# Patient Record
Sex: Male | Born: 1955
Health system: Southern US, Community
[De-identification: ages and names within clinical notes are randomized; demographics above are authoritative.]

## PROBLEM LIST (undated history)

## (undated) DIAGNOSIS — Z973 Presence of spectacles and contact lenses: Secondary | ICD-10-CM

## (undated) DIAGNOSIS — M1990 Unspecified osteoarthritis, unspecified site: Secondary | ICD-10-CM

## (undated) DIAGNOSIS — J189 Pneumonia, unspecified organism: Secondary | ICD-10-CM

## (undated) DIAGNOSIS — E119 Type 2 diabetes mellitus without complications: Secondary | ICD-10-CM

## (undated) DIAGNOSIS — I10 Essential (primary) hypertension: Secondary | ICD-10-CM

## (undated) HISTORY — PX: EYE SURGERY: SHX253

## (undated) HISTORY — PX: RETINAL DETACHMENT SURGERY: SHX105

## (undated) HISTORY — PX: VASECTOMY: SHX75

## (undated) HISTORY — PX: JOINT REPLACEMENT: SHX530

---

## 2000-12-01 ENCOUNTER — Encounter: Payer: Self-pay | Admitting: Emergency Medicine

## 2000-12-01 ENCOUNTER — Emergency Department (HOSPITAL_COMMUNITY): Admission: EM | Admit: 2000-12-01 | Discharge: 2000-12-01 | Payer: Self-pay | Admitting: Emergency Medicine

## 2002-04-23 ENCOUNTER — Inpatient Hospital Stay (HOSPITAL_COMMUNITY): Admission: AD | Admit: 2002-04-23 | Discharge: 2002-04-25 | Payer: Self-pay | Admitting: Internal Medicine

## 2002-04-23 ENCOUNTER — Encounter: Payer: Self-pay | Admitting: Internal Medicine

## 2002-04-24 ENCOUNTER — Encounter: Payer: Self-pay | Admitting: Internal Medicine

## 2002-04-29 ENCOUNTER — Ambulatory Visit (HOSPITAL_COMMUNITY): Admission: RE | Admit: 2002-04-29 | Discharge: 2002-04-29 | Payer: Self-pay | Admitting: Internal Medicine

## 2003-07-10 ENCOUNTER — Emergency Department (HOSPITAL_COMMUNITY): Admission: EM | Admit: 2003-07-10 | Discharge: 2003-07-10 | Payer: Self-pay | Admitting: Emergency Medicine

## 2003-09-13 ENCOUNTER — Ambulatory Visit (HOSPITAL_COMMUNITY): Admission: RE | Admit: 2003-09-13 | Discharge: 2003-09-13 | Payer: Self-pay | Admitting: Internal Medicine

## 2003-09-13 IMAGING — CR DG CHEST 2V
2 series · 2 of 2 positions shown · non-contrast
Comparison: none

CLINICAL DATA: Cough, congestion for one month.  The patient has hypertension.  
 TWO VIEW CHEST
 PA and lateral views of the chest are made [DATE] at [KJ] hours and are compared to previous studies of [DATE] and again show mild generalized peribronchial thickening with minimal anterior degenerative hypertrophic spurring of the spine.  The heart, mediastinum and soft tissues appear normal.
 IMPRESSION
 Normal chest except for stable anterior degenerative hypertrophic spurring of the mid thoracic spine.

[view not recorded (1 of 2)]
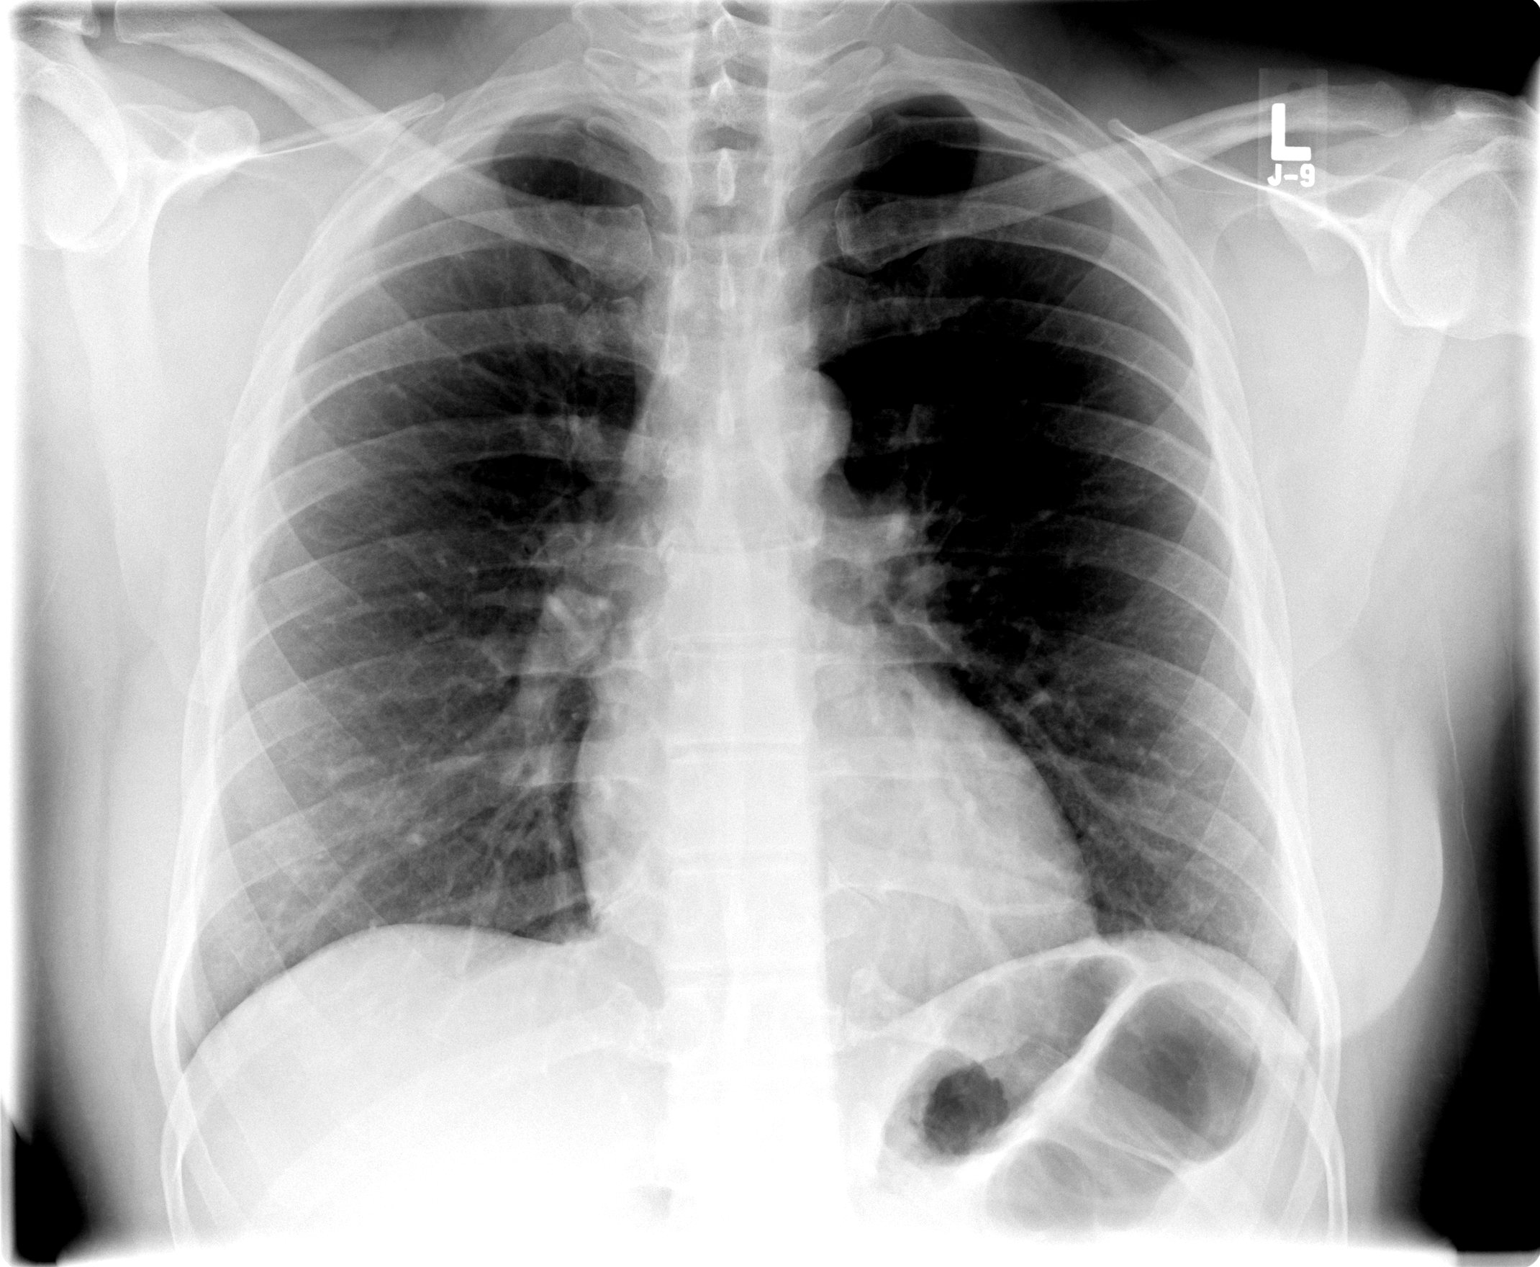

[view not recorded (2 of 2)]
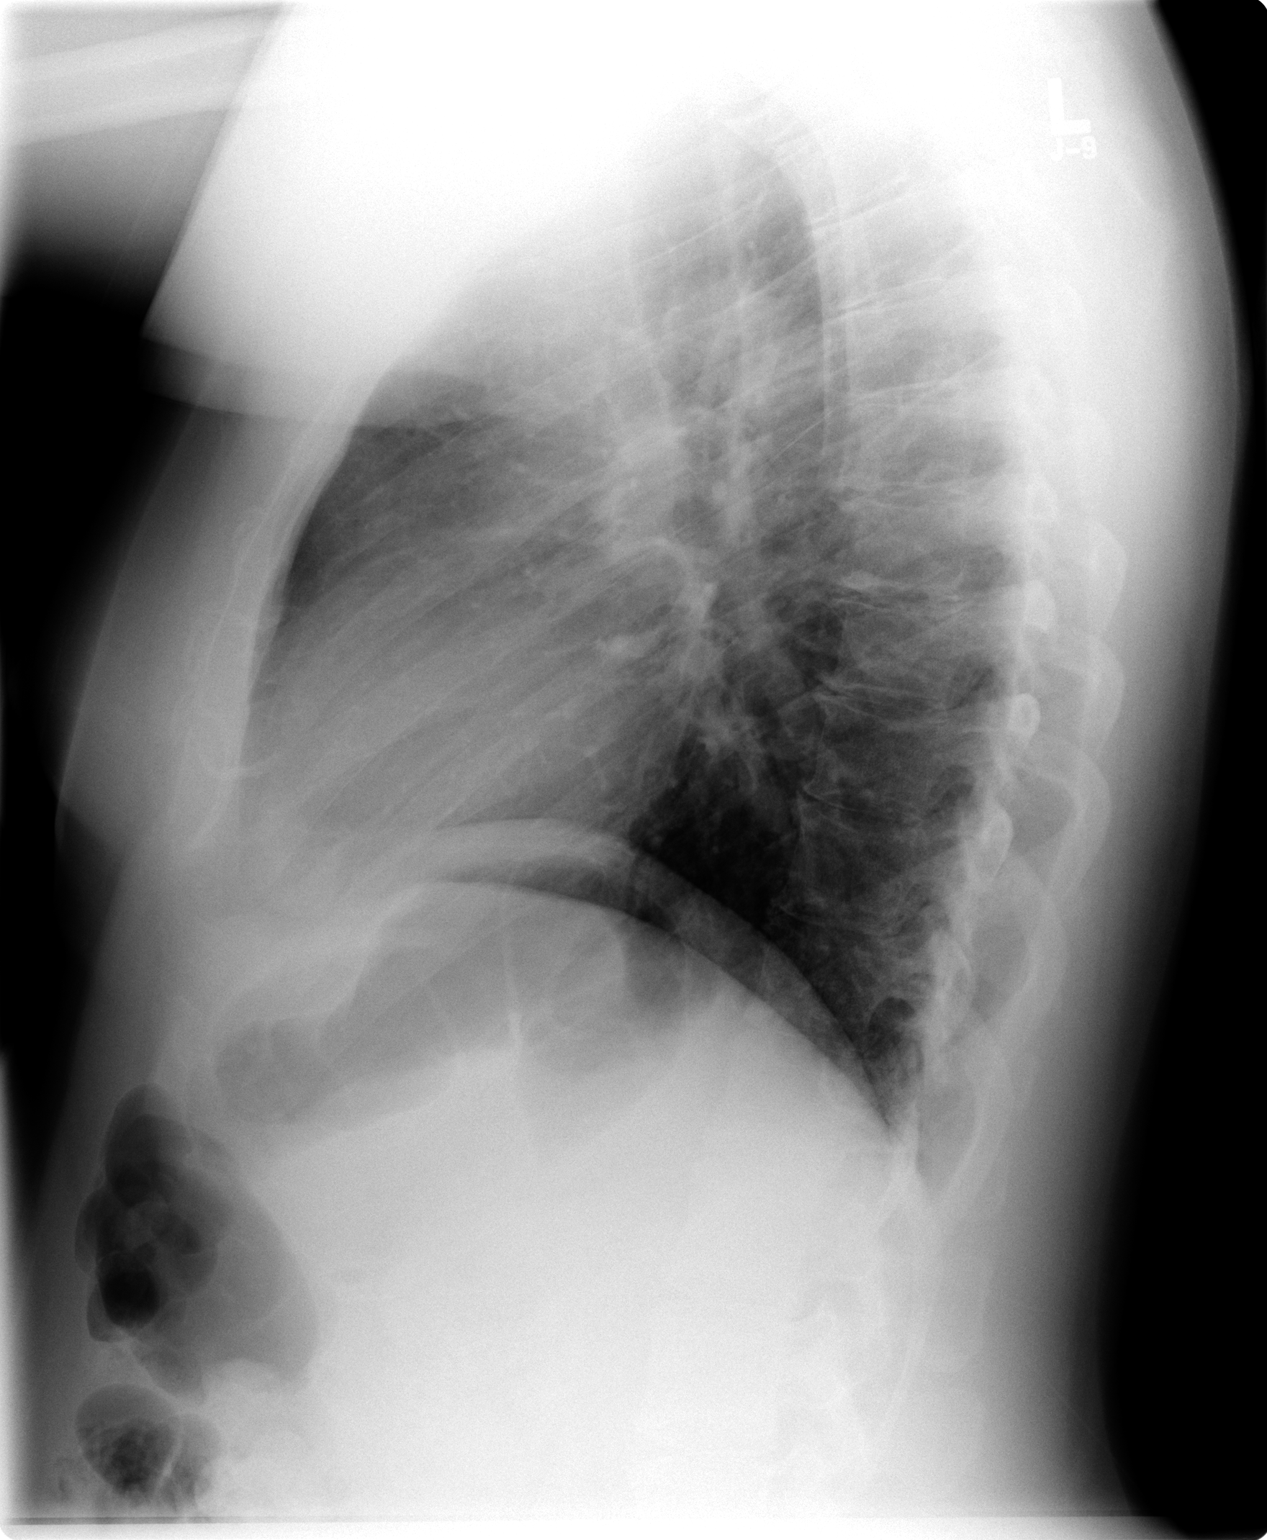

[2 of 2 positions shown; findings below may reference images not displayed]

## 2003-10-15 ENCOUNTER — Emergency Department (HOSPITAL_COMMUNITY): Admission: EM | Admit: 2003-10-15 | Discharge: 2003-10-15 | Payer: Self-pay | Admitting: Emergency Medicine

## 2004-01-04 ENCOUNTER — Emergency Department (HOSPITAL_COMMUNITY): Admission: EM | Admit: 2004-01-04 | Discharge: 2004-01-05 | Payer: Self-pay | Admitting: *Deleted

## 2004-01-15 ENCOUNTER — Emergency Department (HOSPITAL_COMMUNITY): Admission: EM | Admit: 2004-01-15 | Discharge: 2004-01-15 | Payer: Self-pay | Admitting: Emergency Medicine

## 2004-01-15 IMAGING — CR DG ANKLE COMPLETE 3+V*R*
2 series · 2 of 2 positions shown · non-contrast
Comparison: none

CLINICAL DATA: Right ankle pain.
 RIGHT ANKLE, THREE VIEWS
 The ankle mortis is maintained.  No acute fractures are seen.  On the AP view, there is a rounded bony density in the medial joint space, but I do not see this on the mortis view.  I think it may be part of the medial malleolus.  No acute bony findings.
 IMPRESSION
 No acute bony findings.

[view not recorded (1 of 2)]
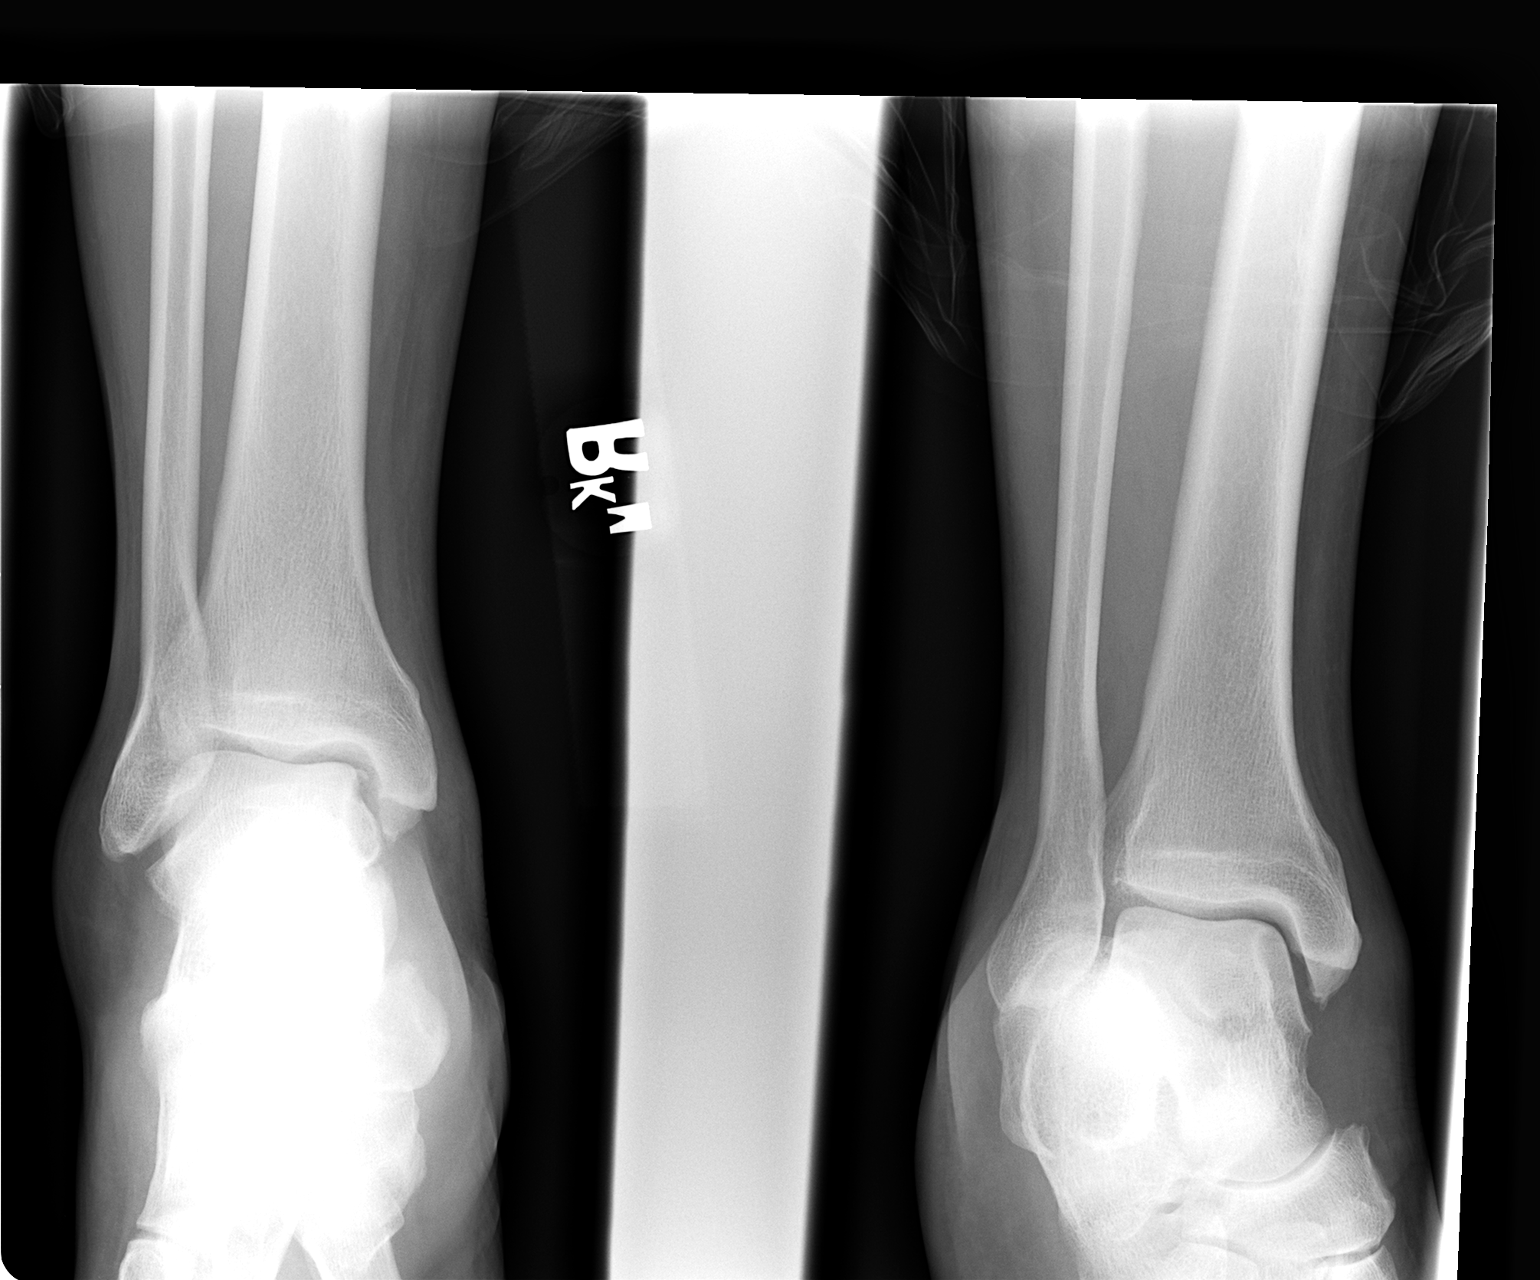

[view not recorded (2 of 2)]
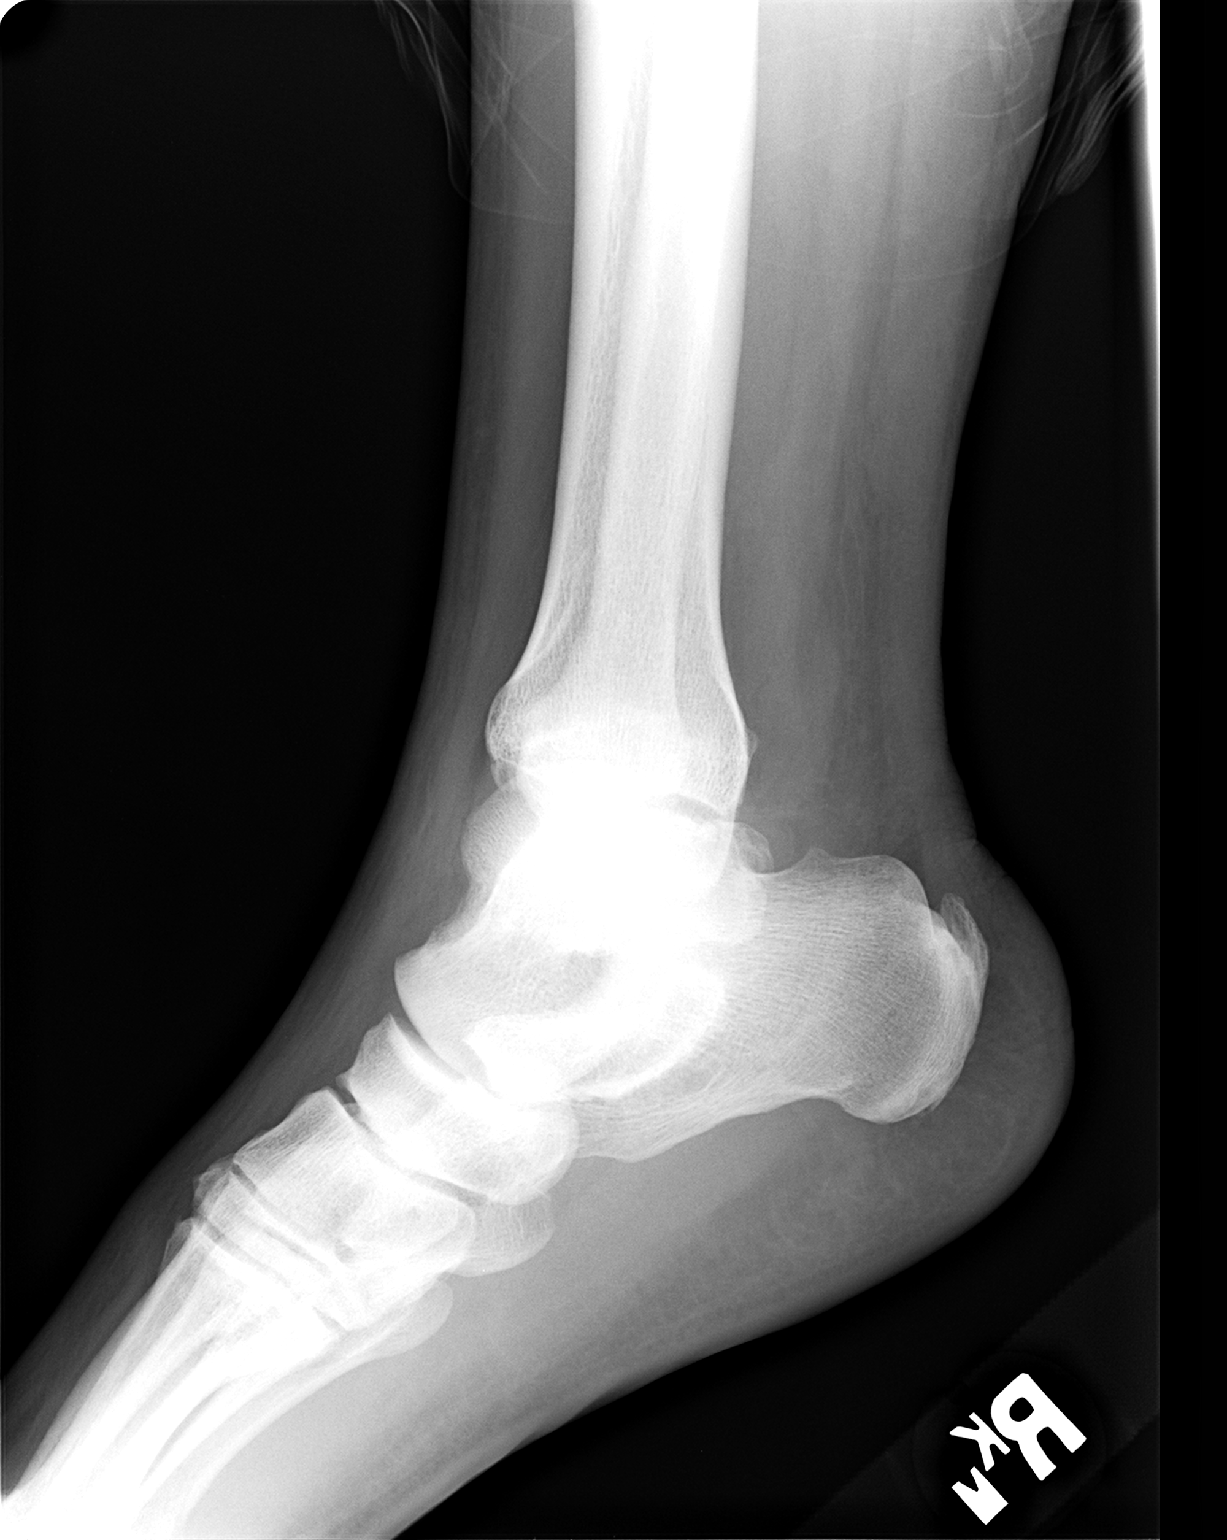

[2 of 2 positions shown; findings below may reference images not displayed]

## 2005-07-06 ENCOUNTER — Emergency Department (HOSPITAL_COMMUNITY): Admission: EM | Admit: 2005-07-06 | Discharge: 2005-07-07 | Payer: Self-pay | Admitting: Emergency Medicine

## 2005-07-07 ENCOUNTER — Emergency Department (HOSPITAL_COMMUNITY): Admission: EM | Admit: 2005-07-07 | Discharge: 2005-07-07 | Payer: Self-pay | Admitting: Emergency Medicine

## 2005-07-07 IMAGING — CR DG ABDOMEN 1V
1 series · 1 of 1 positions shown · non-contrast
Comparison: none

HISTORY: Diarrhea, dehydration

ABDOMEN ONE VIEW:
A few minimally prominent loops of small bowel in right midabdomen.
Gas present in colon.
Pattern overall nonspecific.
No bowel wall thickening or pathologic calcification.
Bones unremarkable.

[view not recorded]
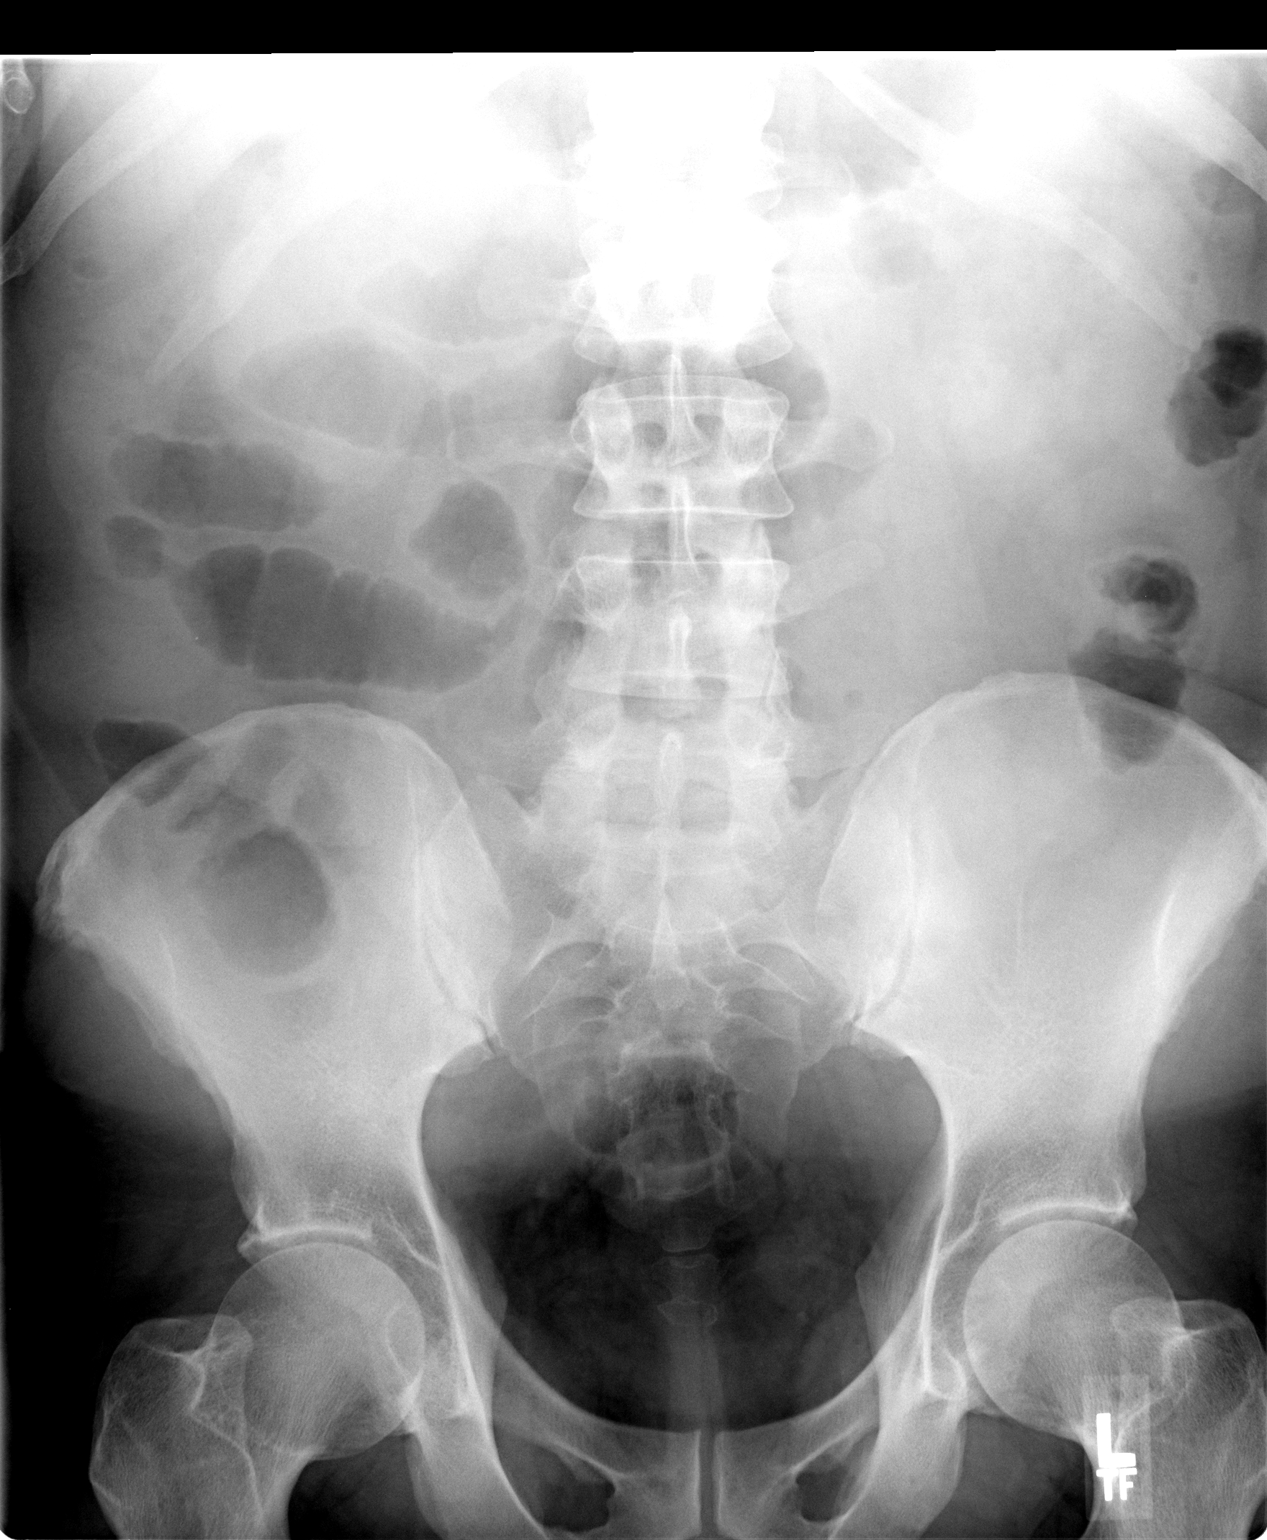

[1 of 1 positions shown; findings below may reference images not displayed]

IMPRESSION: Nonspecific small bowel prominence in right midabdomen, question regional ileus
though early obstruction not excluded.

## 2006-01-10 ENCOUNTER — Encounter: Admission: RE | Admit: 2006-01-10 | Discharge: 2006-01-10 | Payer: Self-pay | Admitting: Pediatrics

## 2006-08-03 ENCOUNTER — Emergency Department (HOSPITAL_COMMUNITY): Admission: EM | Admit: 2006-08-03 | Discharge: 2006-08-03 | Payer: Self-pay | Admitting: Emergency Medicine

## 2007-07-03 HISTORY — PX: COLONOSCOPY: SHX174

## 2008-05-20 ENCOUNTER — Ambulatory Visit: Payer: Self-pay | Admitting: Gastroenterology

## 2008-05-30 ENCOUNTER — Emergency Department (HOSPITAL_COMMUNITY): Admission: EM | Admit: 2008-05-30 | Discharge: 2008-05-30 | Payer: Self-pay | Admitting: Emergency Medicine

## 2008-06-04 ENCOUNTER — Ambulatory Visit: Payer: Self-pay | Admitting: Gastroenterology

## 2008-06-04 ENCOUNTER — Encounter: Payer: Self-pay | Admitting: Gastroenterology

## 2008-06-04 ENCOUNTER — Ambulatory Visit (HOSPITAL_COMMUNITY): Admission: RE | Admit: 2008-06-04 | Discharge: 2008-06-04 | Payer: Self-pay | Admitting: Gastroenterology

## 2009-07-10 ENCOUNTER — Emergency Department (HOSPITAL_COMMUNITY): Admission: EM | Admit: 2009-07-10 | Discharge: 2009-07-11 | Payer: Self-pay | Admitting: Emergency Medicine

## 2010-11-14 NOTE — Consult Note (Signed)
[name], [name] NO.:  [id number]   MEDICAL RECORD NO.:  [id number]          PATIENT TYPE:  AMB   LOCATION:  DAY                           FACILITY:  [facility]   PHYSICIAN:  [name], M.D.      DATE OF BIRTH:  1956-03-16   DATE OF CONSULTATION:  05/20/2008  DATE OF DISCHARGE:                                 CONSULTATION   REASON FOR CONSULTATION:  Need colonoscopy, constipation, and episode of  hematochezia.   PHYSICIAN REQUESTING CONSULTATION:  [name]. Halm, DO, FAAP   HISTORY OF PRESENT ILLNESS:  The patient is a 55 year old African  American gentleman who presented today for further evaluation of  occasional constipation, single episode of hematochezia, and requesting  colonoscopy.  He has never had a colonoscopy.  He has had occasional  constipation, uses milk of magnesia on occasion.  He is trying to eat  more fiber.  He denies any abdominal pain.  He had a previous episode of  bright red blood per rectum.  Denies any black stools.  No nausea,  vomiting, heartburn, dysphagia, odynophagia, or weight loss.   CURRENT MEDICATIONS:  1. Diovan 12.5 mg daily.  2. Aspirin 325 mg daily.   ALLERGIES:  No known drug allergies.   PAST MEDICAL HISTORY:  1. Hypertension.  2. Disk herniation in 1999.  3. Vasectomy in 1997.   FAMILY HISTORY:  Negative for colorectal cancer.   SOCIAL HISTORY:  He is married.  He has 2 children.  He has never been  smoker.  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   REVIEW OF SYSTEMS:  See HPI for GI.  CONSTITUTIONAL:  See HPI.  CARDIOPULMONARY:  No chest pain, shortness of breath, palpitations, or  cough.  GENITOURINARY:  No dysuria or hematuria.   PHYSICAL EXAMINATION:  VITAL SIGNS:  Weight 245, height 5 feet 6 inches,  temp 98, blood pressure 124/82, and pulse 60.  GENERAL:  A pleasant, well-nourished, and well-developed gentleman in no  acute distress.  SKIN:  Warm and dry.  No jaundice.  HEENT:   Sclerae nonicteric.  Oropharyngeal mucosa moist and pink.  No  lesions, erythema, or exudate.  No lymphadenopathy or thyromegaly.  CHEST:  Lungs are clear to auscultation.  CARDIAC:  Regular rate and rhythm.  Normal S1 and S2.  No murmurs, rubs,  or gallops.  ABDOMEN:  Positive bowel sounds.  Abdomen is soft, nondistended, and  nontender.  No organomegaly or masses.  No rebound or guarding.  No  abdominal bruits.  He has a small umbilical hernia easily reducible,  nontender.  LOWER EXTREMITIES:  No edema.   LABS:  From August 2009, PSA 0.4, hemoglobin 13.5, and platelets  207,000.  Creatinine 0.8.  AST 44, ALT 31, total bilirubin 0.7, alkaline  phosphatase 66, and albumin 3.8.   IMPRESSION:  [name] is a very pleasant 55 year old gentleman with  occasional constipation and single episode of hematochezia.  Recommended  colonoscopy at this time for further evaluation.  I have discussed  risks, alternatives, and benefits with regards to, but not  limited to,  the risk of reaction with medications, bleeding, infection, and more  perforation, and he is agreeable to proceed.   PLAN:  1. Colonoscopy with Dr. Kassie York in the near future.  2. We will hold his aspirin 4 days prior to procedure.   I would like to thank Dr. Milford York for allowing [place] to take part in the care  of this patient.      [name], P.A.      [name], M.D.  Electronically Signed    LL/MEDQ  D:  05/20/2008  T:  05/21/2008  Job:  [id number]   cc:   [name]. Cameron Cage DO, FAAP  Fax: [phone]

## 2010-11-14 NOTE — Op Note (Signed)
[name], [name] NO.:  [id number]   MEDICAL RECORD NO.:  [id number]          PATIENT TYPE:  AMB   LOCATION:  DAY                           FACILITY:  [facility]   PHYSICIAN:  Kassie Mends, M.D.      DATE OF BIRTH:  1955/09/24   DATE OF PROCEDURE:  06/04/2008  DATE OF DISCHARGE:                                PROCEDURE NOTE   REFERRING PHYSICIAN:  Francoise Schaumann. Halm, DO, FAAP   PROCEDURE:  Colonoscopy with cold forceps polypectomy.   INDICATION FOR EXAMINATION:  [name] is a 55 year old male who  presents for intermittent rectal bleeding.   FINDINGS:  1. A 4-mm sessile cecal polyp removed via cold forceps.  A 4-mm      sessile descending colon polyp removed via cold forceps.  A 2-mm      sessile descending colon polyp removed via cold forceps.  Otherwise      no masses, inflammatory changes, diverticula, or AVMs seen.  2. Small internal hemorrhoids.  Otherwise, normal retroflexed view of      the rectum.   DIAGNOSIS:  Intermittent rectal bleeding, likely secondary to small  internal hemorrhoids.   RECOMMENDATIONS:  1. We will call [name] with the results of his biopsies.  If his      polyp is a simple adenoma, then he will need a colonoscopy in 10      years.  2. He may restart his aspirin on June 10, 2008.  3. He should follow a high-fiber diet.  He is given a handout on high-      fiber diet, polyps, and hemorrhoids.  4. No aspirin, NSAIDs, or anticoagulation for 5 days.   MEDICATIONS:  1. Demerol 100 mg IV.  2. Versed 5 mg IV.   PROCEDURE TECHNIQUE:  Physical exam was performed.  Informed consent was  obtained from the patient after explaining the benefits, risks, and  alternatives to the procedure.  The patient was connected to the monitor  and placed in the left lateral position.  Continuous oxygen was provided  by nasal cannula.  IV medicine was administered through an indwelling  cannula.  After administration of sedation and rectal  exam, the  patient's rectum was intubated, and the scope was advanced under direct  visualization to the cecum.  The scope was removed slowly  by carefully examining the color, texture, anatomy, and integrity of the  mucosa on the way out.  The patient was recovered in endoscopy and  discharged home in satisfactory condition.   PATH:  Simple adenomas. TCS in 10 years.      Kassie Mends, M.D.  Electronically Signed     SM/MEDQ  D:  06/04/2008  T:  06/04/2008  Job:  [id number]   cc:   Francoise Schaumann. Milford Cage DO, FAAP  Fax: [phone]

## 2010-11-17 NOTE — H&P (Signed)
NAMEREICHEN, HUTZLER                        ACCOUNT NO.:  [id number]   MEDICAL RECORD NO.:  [id number]                   PATIENT TYPE:  INP   LOCATION:  A203                                 FACILITY:  [facility]   PHYSICIAN:  Tesfaye D. Felecia Shelling, M.D.              DATE OF BIRTH:  Aug 06, 1955   DATE OF ADMISSION:  04/23/2002  DATE OF DISCHARGE:                                HISTORY & PHYSICAL   CHIEF COMPLAINT:  Syncopal episode.   HISTORY OF PRESENT ILLNESS:  This is a 55 year old black male with no  significant medical history came to the office with a history of a syncopal  episode.  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  He was performing his usual duties when he suddenly passed  out and hit the floor.  He was unconscious for about four minutes and when  he later woke up EMS came and evaluated the patient.  He had normal vitals  and a blood pressure of 80 during the evaluation of EMS.  The patient  declined to go to the emergency room; however, he continued to have headache  and dizziness and finally came to my office, where the patient was seen and  admitted for further evaluation.  The patient had no seizure activity or  chest pain.  He had no similar episodes in the past.  No history of known  cardiac disease.   PAST MEDICAL HISTORY:  The patient was previously treated for back pain with  Naprosyn.  Otherwise, no significant illness.   MEDICATIONS:  The patient is not currently on any prescription medication.   SOCIAL HISTORY:  The patient is married.  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  He has no history of alcohol, tobacco, or substance abuse.   PHYSICAL EXAMINATION:  GENERAL:  The patient is alert, awake, and sick  looking.  VITAL SIGNS:  Blood pressure 151/83, pulse 62, respiratory rate 20,  temperature 98 degrees Fahrenheit.  There is no orthostatic change.  HEENT:  Pupils are equal and reactive.  NECK:  Supple.  CHEST:   Decreased air entry, bilateral rhonchi.  CARDIOVASCULAR:  First and second heart sounds heard.  No murmur, no gallop.  ABDOMEN:  Soft and lax.  Bowel sounds are positive.  No masses, no  organomegaly.  EXTREMITIES:  No leg edema.   ASSESSMENT:  This is a 55 year old black male with no history of significant  medical illness in the past who came due to a syncopal episode which lasted  for about four minutes.  Etiology of his symptoms is not clear at this time.  The differential could include cardiac arrhythmia, seizure disorder, and  intracranial bleed.    PLAN:  We will admit the patient onto telemetry.  We will do serial EKG's  and cardiac enzymes.  We will look at echocardiogram, carotid Doppler, and  we will do also an electroencephalogram.                                               Tesfaye D. Felecia Shelling, M.D.    TDF/MEDQ  D:  04/24/2002  T:  04/24/2002  Job:  [id number]

## 2010-11-17 NOTE — Discharge Summary (Signed)
   NAMEHERMANN, DOTTAVIO                        ACCOUNT NO.:  [id number]   MEDICAL RECORD NO.:  [id number]                   PATIENT TYPE:  INP   LOCATION:  A203                                 FACILITY:  [facility]   PHYSICIAN:  Tesfaye D. Felecia Shelling, M.D.              DATE OF BIRTH:  [birth year]   DATE OF ADMISSION:  04/23/2002  DATE OF DISCHARGE:  04/25/2002                                 DISCHARGE SUMMARY   DISCHARGE DIAGNOSIS:  Syncopal episode.   DISCHARGE MEDICATIONS:  Aspirin 325 mg p.o. daily.   HOSPITAL COURSE:  This is a 55 year old black male who has no significant  past medical history.  He was admitted to [facility] on April 23, 2002, after he passed out at work.  He had no chest pain or shortness of  breath.  The patient had serial EKGs, cardiac enzymes, echocardiogram, and  CT scan of the head.  All his tests that were ordered were within normal  limits.  The patient was started on aspirin.  He was discharged to home in  stable condition, to do EEG on outpatient basis and continue follow-up in  the office.                                               Tesfaye D. Felecia Shelling, M.D.    TDF/MEDQ  D:  07/28/2002  T:  07/28/2002  Job:  [id number]

## 2010-11-17 NOTE — Procedures (Signed)
   NAMEREEVES, MUSICK                        ACCOUNT NO.:  [id number]   MEDICAL RECORD NO.:  [id number]                   PATIENT TYPE:  INP   LOCATION:  A203                                 FACILITY:  [facility]   PHYSICIAN:  Orchard Bing, M.D. [facility]           DATE OF BIRTH:  Jun 19, 1956   DATE OF PROCEDURE:  04/24/2002  DATE OF DISCHARGE:  04/25/2002                                  ECHOCARDIOGRAM   CLINICAL DATA:  A 55 year old gentleman with syncope.   1. Technically adequate echocardiographic study.  2. Mild left atrial enlargement; normal right atrial size.  Normal right     ventricular size and function.  3. Mild aortic sclerosis with trivial insufficiency.  4. Normal mitral valve with trivial regurgitation.  5. Normal tricuspid and pulmonic valves.  6. Normal internal dimension of the left ventricle; mild LVH.  Hyperdynamic     regional and global LV systolic function.  7. Normal Doppler examination.                                               [place] Bing, M.D. [facility]    RR/MEDQ  D:  04/26/2002  T:  04/27/2002  Job:  [id number]

## 2013-07-21 ENCOUNTER — Emergency Department (HOSPITAL_COMMUNITY): Payer: BC Managed Care – PPO

## 2013-07-21 ENCOUNTER — Encounter (HOSPITAL_COMMUNITY): Payer: Self-pay | Admitting: Emergency Medicine

## 2013-07-21 ENCOUNTER — Emergency Department (HOSPITAL_COMMUNITY)
Admission: EM | Admit: 2013-07-21 | Discharge: 2013-07-21 | Disposition: A | Discharge status: Home / Self Care | Payer: BC Managed Care – PPO | Attending: Emergency Medicine | Admitting: Emergency Medicine

## 2013-07-21 DIAGNOSIS — R209 Unspecified disturbances of skin sensation: Secondary | ICD-10-CM | POA: Insufficient documentation

## 2013-07-21 DIAGNOSIS — I10 Essential (primary) hypertension: Secondary | ICD-10-CM | POA: Insufficient documentation

## 2013-07-21 DIAGNOSIS — M1990 Unspecified osteoarthritis, unspecified site: Secondary | ICD-10-CM

## 2013-07-21 DIAGNOSIS — M19019 Primary osteoarthritis, unspecified shoulder: Secondary | ICD-10-CM | POA: Insufficient documentation

## 2013-07-21 DIAGNOSIS — M25519 Pain in unspecified shoulder: Secondary | ICD-10-CM

## 2013-07-21 DIAGNOSIS — M5412 Radiculopathy, cervical region: Secondary | ICD-10-CM

## 2013-07-21 HISTORY — DX: Essential (primary) hypertension: I10

## 2013-07-21 IMAGING — CR DG SHOULDER 2+V*R*
3 series · 3 of 3 positions shown · non-contrast
Comparison: None.

CLINICAL DATA: Pain

EXAM:
RIGHT SHOULDER - 2+ VIEW

[view not recorded (1 of 3)]
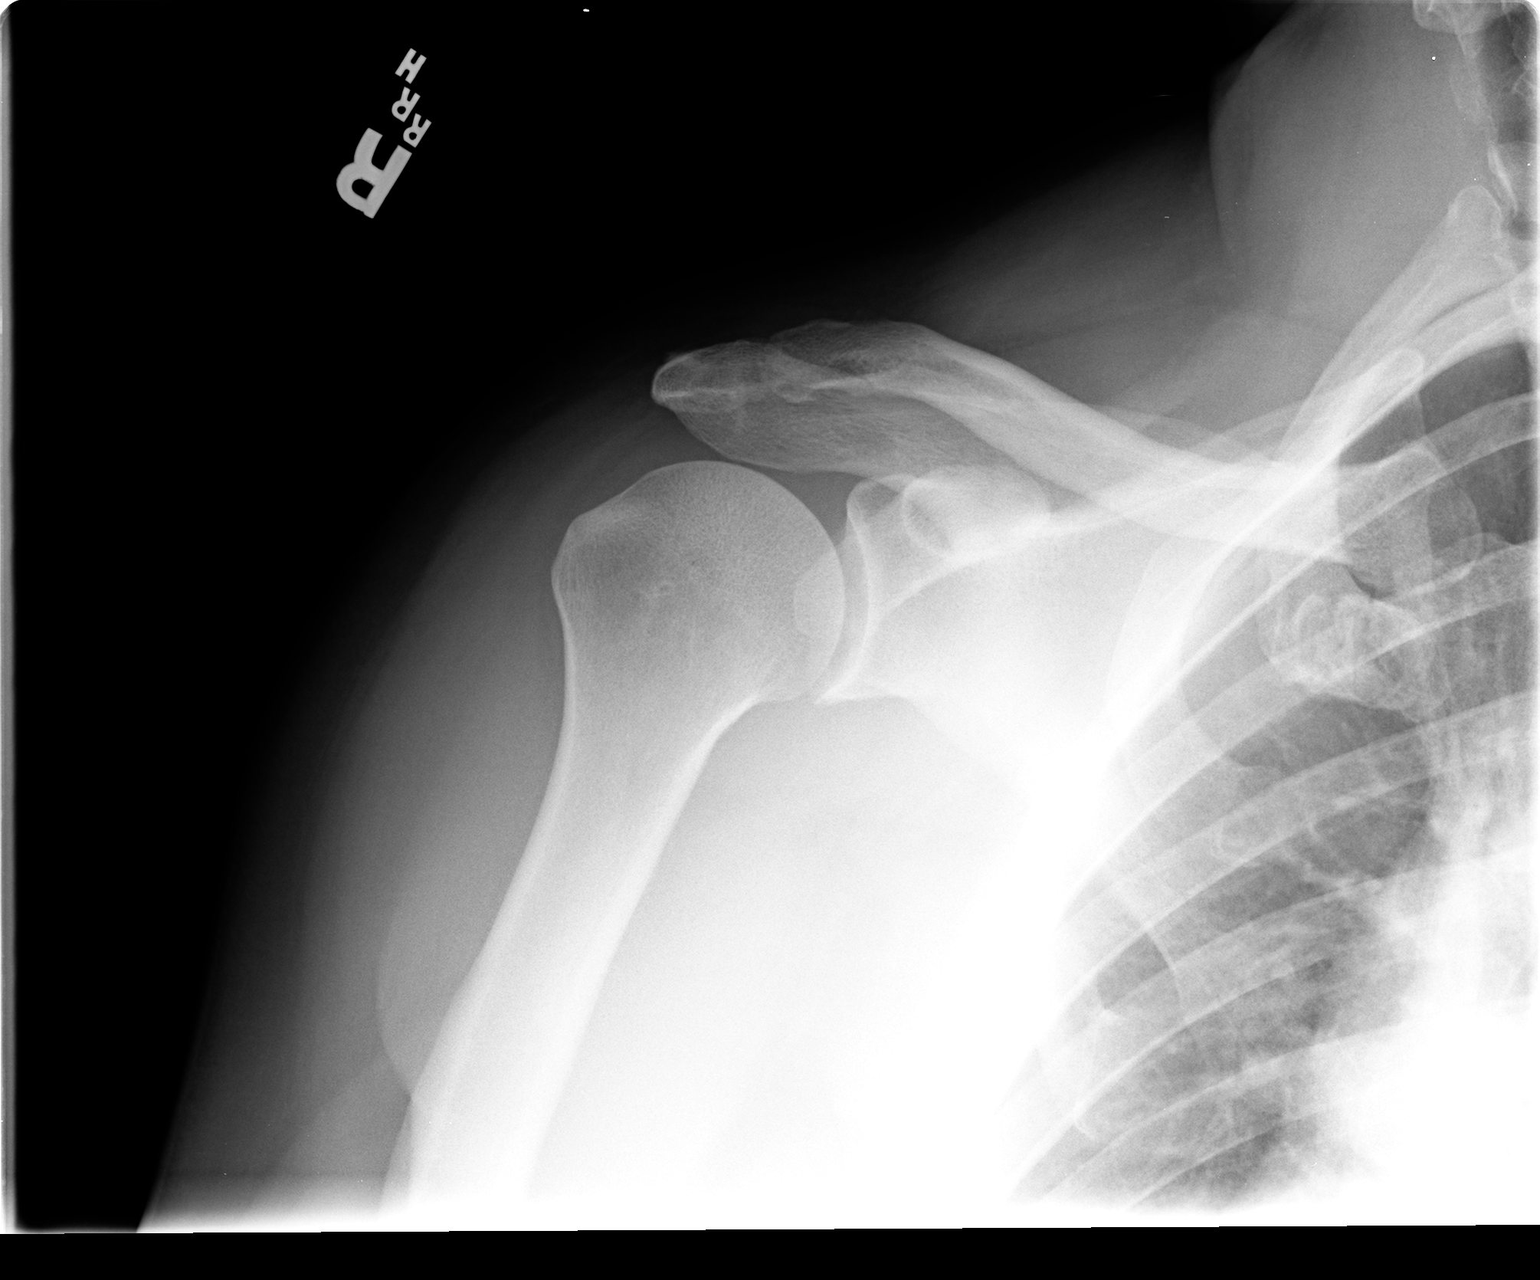

[view not recorded (2 of 3)]
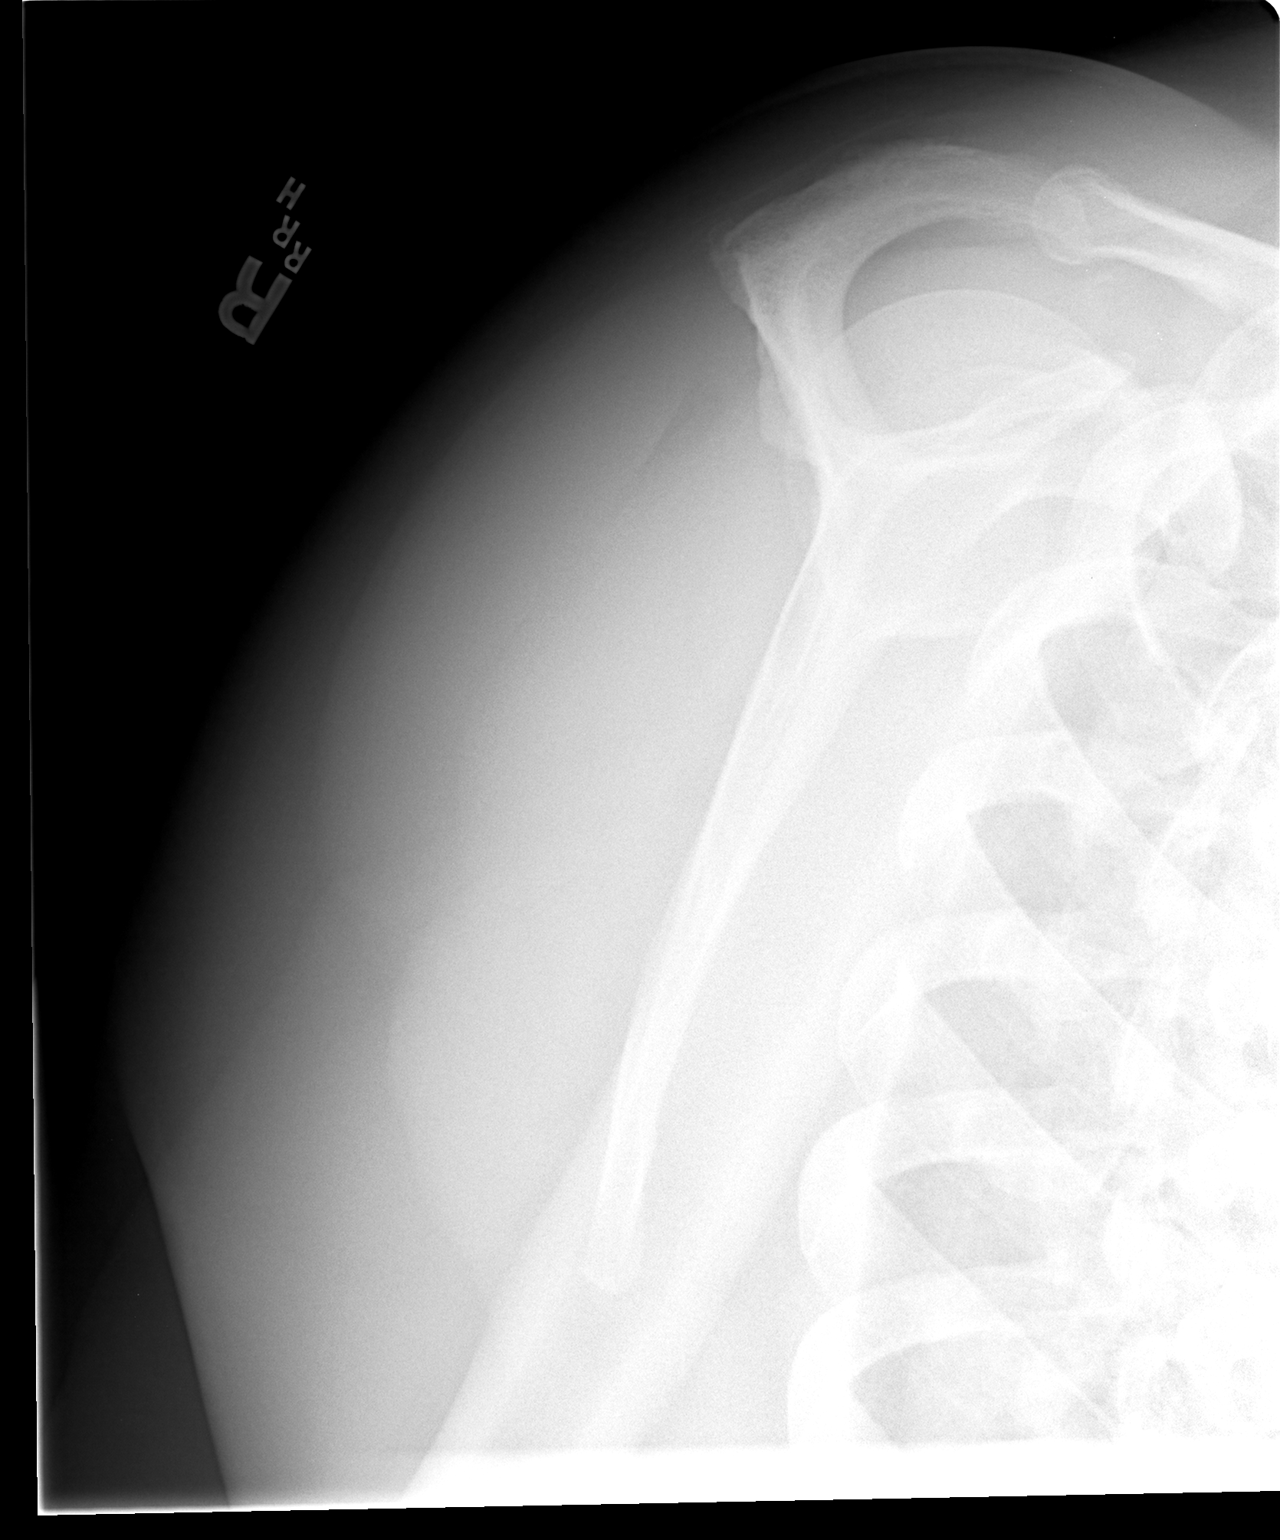

[view not recorded (3 of 3)]
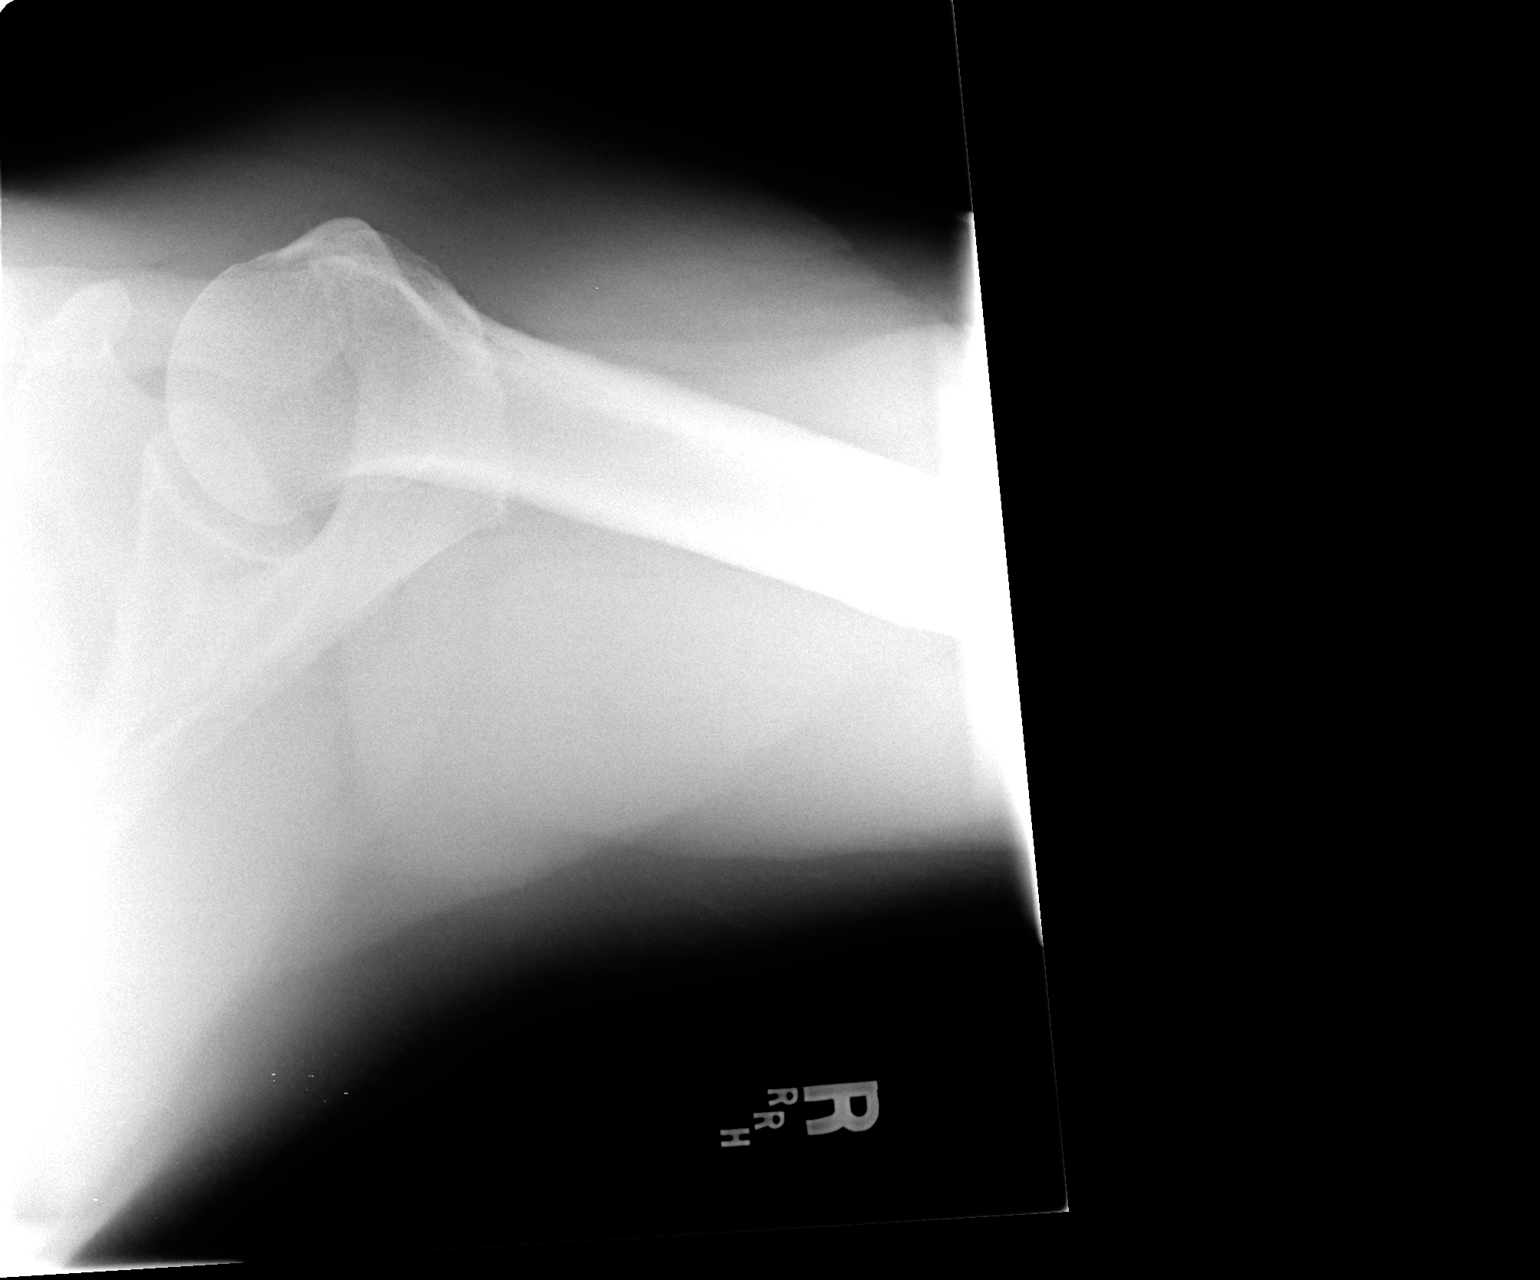

[3 of 3 positions shown; findings below may reference images not displayed]

FINDINGS: Frontal, Y scapular, and axillary images were obtained. No fracture
or dislocation. There is slight osteoarthritic change in the
acromioclavicular joint. No erosive change or intra-articular
calcification.
IMPRESSION: Slight osteoarthritic change in the acromioclavicular joint. No
fracture or dislocation.

## 2013-07-21 MED ORDER — NAPROXEN 375 MG PO TABS: 375.0000 mg | ORAL_TABLET | Freq: Two times a day (BID) | ORAL | Status: DC

## 2013-07-21 NOTE — ED Notes (Signed)
Pain rt shoulder and down arm  Into hand, tingling at times. Onset 1 week ago. No known injury. Good radial pulse and distal sensation.

## 2013-07-21 NOTE — ED Notes (Signed)
Patient c/o right shoulder pain that radiates down arm and into hand. Patient reports pain as a numb, cramping pain. Patient denies any know injury or weakness of right shoulder or arm. 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 Patient reports taking aspirin 325mg  today with no relief.

## 2013-07-21 NOTE — ED Provider Notes (Signed)
CSN: [id number]     Arrival date & time 07/21/13  1019 History   First MD Initiated Contact with Patient 07/21/13 1044     Chief Complaint  Patient presents with  . Shoulder Pain   (Consider location/radiation/quality/duration/timing/severity/associated sxs/prior Treatment) The history is provided by the patient. No language interpreter was used.  [name] is a 58 year old male with past medical history of hypertension presenting to emergency department with right shoulder pain that has been ongoing for approximately one week. Patient reports that the shoulder pain is localized to the back of the shoulder described as a constant proper, squeezing sensation. Patient reported he's been noticing tingling sensations running down to his fingers. Patient reported that he's been using Tylenol and ibuprofen with minimal relief. 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 Patient reported that when he lays down in bed with a pillow underneath his right shoulder the pain is alleviated. Denied weakness, loss of sensation, falls, injuries, neck pain, neck stiffness, numbness to the face. PCP none  Past Medical History  Diagnosis Date  . Hypertension    History reviewed. No pertinent past surgical history. History reviewed. No pertinent family history. History  Substance Use Topics  . Smoking status: Never Smoker   . Smokeless tobacco: Never Used  . Alcohol Use: No    Review of Systems  Respiratory: Negative for chest tightness and shortness of breath.   Cardiovascular: Negative for chest pain.  Musculoskeletal: Positive for arthralgias (Right shoulder). Negative for neck pain.  Neurological: Positive for numbness. Negative for dizziness and weakness.  All other systems reviewed and are negative.    Allergies  Flexeril and Robaxin  Home Medications   Current Outpatient Rx  Name  Route  Sig  Dispense  Refill  . naproxen  (NAPROSYN) 375 MG tablet   Oral   Take 1 tablet (375 mg total) by mouth 2 (two) times daily.   20 tablet   0    BP 160/72  Pulse 71  Temp(Src) 98 F (36.7 C) (Oral)  Resp 16  Ht 5\' 5"  (1.651 m)  Wt 252 lb 3.2 oz (114.397 kg)  BMI 41.97 kg/m2  SpO2 98% Physical Exam  Nursing note and vitals reviewed. Constitutional: He is oriented to person, place, and time. He appears well-developed and well-nourished. No distress.  HENT:  Head: Normocephalic and atraumatic.  Eyes: Conjunctivae and EOM are normal. Right eye exhibits no discharge. Left eye exhibits no discharge.  Neck: Normal range of motion. Neck supple.  Negative neck stiffness Negative nuchal rigidity  Cardiovascular: Normal rate, regular rhythm and normal heart sounds.  Exam reveals no friction rub.   No murmur heard. Pulses:      Radial pulses are 2+ on the right side, and 2+ on the left side.  Pulmonary/Chest: Effort normal and breath sounds normal. No respiratory distress. He has no wheezes. He has no rales.  Musculoskeletal: Normal range of motion. He exhibits tenderness.       Arms: Negative swelling, erythema, inflammation, lesions, sores, deformities, sunken in appearance identified to the right shoulder. Discomfort upon palpation to the posterior aspect of the right shoulder. Full range of motion noted-extension, flexion, inversion, eversion, adduction, abduction. Negative drop arm. Negative apprehension test. Full range of motion to remaining right upper extremity without difficulty or ataxia.  Neurological: He is alert and oriented to person, place, and time. He exhibits normal muscle tone. Coordination normal.  Cranial nerves III-XII grossly intact  Strength 5+/5+ to upper extremities bilaterally with resistance applied, equal distribution noted Strength intact to MCP, PIP, DIP joints of left hand Sensation intact with differentiation to sharp and dull touch Gait proper, proper balance - negative sway, negative  drift, negative step-offs  Skin: Skin is warm and dry. No rash noted. He is not diaphoretic. No erythema.  Psychiatric: He has a normal mood and affect. His behavior is normal. Thought content normal.    ED Course  Procedures (including critical care time)  Dg Shoulder Right  07/21/2013   CLINICAL DATA:  Pain  EXAM: RIGHT SHOULDER - 2+ VIEW  COMPARISON:  None.  FINDINGS: Frontal, Y scapular, and axillary images were obtained. No fracture or dislocation. There is slight osteoarthritic change in the acromioclavicular joint. No erosive change or intra-articular calcification.  IMPRESSION: Slight osteoarthritic change in the acromioclavicular joint. No fracture or dislocation.   Electronically Signed   By: Lowella Grip M.D.   On: 07/21/2013 11:28   Labs Review Labs Reviewed - No data to display Imaging Review Dg Shoulder Right  07/21/2013   CLINICAL DATA:  Pain  EXAM: RIGHT SHOULDER - 2+ VIEW  COMPARISON:  None.  FINDINGS: Frontal, Y scapular, and axillary images were obtained. No fracture or dislocation. There is slight osteoarthritic change in the acromioclavicular joint. No erosive change or intra-articular calcification.  IMPRESSION: Slight osteoarthritic change in the acromioclavicular joint. No fracture or dislocation.   Electronically Signed   By: Lowella Grip M.D.   On: 07/21/2013 11:28    EKG Interpretation   None       MDM   1. Shoulder pain   2. Osteoarthritis   3. Cervical radiculopathy    Filed Vitals:   07/21/13 1038 07/21/13 1045  BP: 160/72   Pulse: 71   Temp: 98 F (36.7 C)   TempSrc: Oral   Resp: 16   Height: 5\' 5"  (1.651 m)   Weight:  252 lb 3.2 oz (114.397 kg)  SpO2: 98%    Patient presenting to emergency department with right shoulder pain does been ongoing for one week-described as a squeezing, throbbing sensation that is constant with numbness running down the arm to the fingers. Alert oriented. GCS 15. Heart rate and rhythm normal. Lungs clear  to auscultation. Radial pulses 2+ bilaterally. Negative deformities identified to the right shoulder. Discomfort upon palpation to the posterior aspect of the right shoulder. Full range of motion noted without difficulty or ataxia. Strength intact without difficulty, equal distribution noted. Strong grip bilaterally. Strength intact to MCP, PIP, DIP joints to the left hand. Sensation intact. Patient neurovascularly intact. Plain film of right shoulder noted osseous changes to the acromioclavicular joint-negative acute abnormalities. Negative findings for fracture dislocation. Suspicion discomfort to be cervical radiculopathy, osteoarthritis due to constant wear and tear and use of the right shoulder. Patient stable, afebrile. Discharged patient. Discharged patient with naproxen. Referred patient to orthopedics surgery. Discussed with patient to rest, ice, massage icy hot ointment. Discussed with patient to closely monitor symptoms and if symptoms are to worsen or change to report back to the ED - strict return instructions given.  Patient agreed to plan of care, understood, all questions answered.      Jamse Mead, PA-C 07/22/13 2007

## 2013-07-21 NOTE — Discharge Instructions (Signed)
Please call and set up an appointment with orthopedics surgery to be reassessed regarding right shoulder pain-may need an MRI to be performed Please take medications as prescribed and on a full stomach Please apply icy hot ointment and massage to shoulder, while doing so move the shoulder in a slow circular motions Please avoid any physical or strenuous activity Please continue to monitor symptoms closely and if symptoms are to worsen or change (fever greater than 101, chills, neck pain, neck stiffness, chest pain, shortness of breath, difficulty breathing, numbness, tingling, fall, injury, changes to pain pattern) please report back to emergency department immediately  Osteoarthritis Osteoarthritis is a disease that causes soreness and swelling (inflammation) of a joint. It occurs when the cartilage at the affected joint wears down. Cartilage acts as a cushion, covering the ends of bones where they meet to form a joint. Osteoarthritis is the most common form of arthritis. It often occurs in older people. The joints affected most often by this condition include those in the:  Ends of the fingers.  Thumbs.  Neck.  Lower back.  Knees.  Hips. CAUSES  Over time, the cartilage that covers the ends of bones begins to wear away. This causes bone to rub on bone, producing pain and stiffness in the affected joints.  RISK FACTORS Certain factors can increase your chances of having osteoarthritis, including:  Older age.  Excessive body weight.  Overuse of joints. SIGNS AND SYMPTOMS   Pain, swelling, and stiffness in the joint.  Over time, the joint may lose its normal shape.  Small deposits of bone (osteophytes) may grow on the edges of the joint.  Bits of bone or cartilage can break off and float inside the joint space. This may cause more pain and damage. DIAGNOSIS  Your health care provider will do a physical exam and ask about your symptoms. Various tests may be ordered, such  as:  X-rays of the affected joint.  An MRI scan.  Blood tests to rule out other types of arthritis.  Joint fluid tests. This involves using a needle to draw fluid from the joint and examining the fluid under a microscope. TREATMENT  Goals of treatment are to control pain and improve joint function. Treatment plans may include:  A prescribed exercise program that allows for rest and joint relief.  A weight control plan.  Pain relief techniques, such as:  Properly applied heat and cold.  Electric pulses delivered to nerve endings under the skin (transcutaneous electrical nerve stimulation, TENS).  Massage.  Certain nutritional supplements.  Medicines to control pain, such as:  Acetaminophen.  Nonsteroidal anti-inflammatory drugs (NSAIDs), such as naproxen.  Narcotic or central-acting agents, such as tramadol.  Corticosteroids. These can be given orally or as an injection.  Surgery to reposition the bones and relieve pain (osteotomy) or to remove loose pieces of bone and cartilage. Joint replacement may be needed in advanced states of osteoarthritis. HOME CARE INSTRUCTIONS   Only take over-the-counter or prescription medicines as directed by your health care provider. Take all medicines exactly as instructed.  Maintain a healthy weight. Follow your health care provider's instructions for weight control. This may include dietary instructions.  Exercise as directed. Your health care provider can recommend specific types of exercise. These may include:  Strengthening exercises These are done to strengthen the muscles that support joints affected by arthritis. They can be performed with weights or with exercise bands to add resistance.  Aerobic activities These are exercises, such as brisk walking  or low-impact aerobics, that get your heart pumping.  Range-of-motion activities These keep your joints limber.  Balance and agility exercises These help you maintain daily  living skills.  Rest your affected joints as directed by your health care provider.  Follow up with your health care provider as directed. SEEK MEDICAL CARE IF:   Your skin turns red.  You develop a rash in addition to your joint pain.  You have worsening joint pain. SEEK IMMEDIATE MEDICAL CARE IF:  You have a significant loss of weight or appetite.  You have a fever along with joint or muscle aches.  You have night sweats. [place] of Arthritis and Musculoskeletal and Skin Diseases: www.niams.SouthExposed.es [employer] on Aging: http://kim-miller.com/ American College of Rheumatology: www.rheumatology.org Document Released: 06/18/2005 Document Revised: 04/08/2013 Document Reviewed: 02/23/2013 [facility] Patient Information 2014 [place], [place].   Emergency Department Resource Guide 1) Find a Doctor and Pay Out of Pocket Although you won't have to find out who is covered by your insurance plan, it is a good idea to ask around and get recommendations. You will then need to call the office and see if the doctor you have chosen will accept you as a new patient and what types of options they offer for patients who are self-pay. Some doctors offer discounts or will set up payment plans for their patients who do not have insurance, but you will need to ask so you aren't surprised when you get to your appointment.  2) Contact Your Local Health Department Not all health departments have doctors that can see patients for sick visits, but many do, so it is worth a call to see if yours does. If you don't know where your local health department is, you can check in your phone book. The CDC also has a tool to help you locate your state's health department, and many state websites also have listings of all of their local health departments.  3) Find a [facility] If your illness is not likely to be very severe or complicated, you may want to try a walk in clinic. These  are popping up all over the country in pharmacies, drugstores, and shopping centers. They're usually staffed by nurse practitioners or physician assistants that have been trained to treat common illnesses and complaints. They're usually fairly quick and inexpensive. However, if you have serious medical issues or chronic medical problems, these are probably not your best option.  No Primary Care Doctor: - Call Health Connect at  [phone] - they can help you locate a primary care doctor that  accepts your insurance, provides certain services, etc. - Physician Referral Service- [phone]  Chronic Pain Problems: Organization         Address  Phone   Notes  [facility]  [phone] Patients need to be referred by their primary care doctor.   Medication Assistance: Organization         Address  Phone   Notes  [facility] Medication [facility] [address] [place], [place] [address] [phone] --Must be a resident of [facility] -- Must have NO insurance coverage whatsoever (no Medicaid/ Medicare, etc.) -- The pt. MUST have a primary care doctor that directs their care regularly and follows them in the community   [facility]  [phone]   [employer]  [phone]    Agencies that provide inexpensive medical care: Organization  Address  Phone   Notes  [place]  [phone]   [facility]    [phone]   [facility] [place], [place] [address] [phone]   [place] [address] [place], [place] [phone]   Planned Parenthood    [phone]   [facility]    [phone]   [place] and [place] [address], [place] Phone:  [phone], Fax:  [phone] Hours of Operation:  9 am - 6 pm, M-F.  Also accepts Medicaid/Medicare and self-pay.  [facility] for [place], [address], [place] Phone: [phone], Fax: [phone]. Hours of Operation:  8:30 am - 5:30 pm, M-F.  Also accepts Medicaid and self-pay.  [facility] [address] [place] Phone: [phone]   [place], [place], [place] [phone], Ext. [phone] Mondays & Thursdays: 7-9 AM.  First 15 patients are seen on a first come, first serve basis.    [place] Providers:  Organization         Address  Phone   Notes  [facility] [address], [place] [phone] Also accepts self-pay patients.  [facility] [address], [place]  [phone]   [place], [address], [place] [phone]   [facility] [address], [place] [phone]   Lucianne Lei [address], [place]   [phone] Only accepts [place] Access [place] patients after they have their name applied to their card.   Self-Pay (no insurance) in [facility]:  Organization         Address  Phone   Notes  Sickle Cell Patients, [facility] [place] [phone]   [facility] [place] [phone]   [facility] [place]  [place], [place], [place] [phone]   [facility]/Dr. Osei-Bonsu  [address], Maria Stein or [address], [place] [phone] Phone number for both [place] and [place] locations is the same.  [facility] [address] [place] [phone]   [facility] [address] [place] or [address] [phone]   [facility] [address] [place] [phone], phone; [phone], fax Sees patients 1st and 3rd  Saturday of every month.  Must not qualify for public or private insurance (i.e. Medicaid, Medicare, [place] Health Choice, Veterans' Benefits)  Household income should be no more than 200% of the poverty level The clinic cannot treat you if you are pregnant or think you are pregnant  Sexually transmitted diseases are not treated at the clinic.    Dental Care: Organization         Address  Phone  Notes  [facility] Department of [facility] [place] [phone] Accepts children up to age 65 who are enrolled in [place] or [place]; pregnant women with a Medicaid card; and children who have applied for Medicaid or [place] Health Choice, but were declined, whose parents can pay a reduced fee at time  of service.  [facility] Department of [facility]  [address], [place] [phone] Accepts children up to age 26 who are enrolled in [place] or [place]; pregnant women with a Medicaid card; and children who have applied for Medicaid or [place] Health Choice, but were declined, whose parents can pay a reduced fee at time of service.  [facility] Access PRO[facility]  [place] [phone] Patients are seen by appointment only. Walk-ins are not accepted. [place] will see patients 60 years of age and older. Monday - Tuesday (8am-5pm) Most Wednesdays (8:30-5pm) $30 per visit, cash only  [facility] Adult [facility] Access PROGRAM  [address], [facility] [phone] Patients are seen by appointment only. Walk-ins are not accepted. [place] will see patients 45 years of age and older. One Wednesday Evening (Monthly: Volunteer Based).  $30 per visit, cash only  [place]  [phone] for adults; Children under age 57, call Graduate Pediatric Dentistry at [phone]. Children aged 27-14, please call [phone] to request a pediatric  application.  Dental services are provided in all areas of dental care including fillings, crowns and bridges, complete and partial dentures, implants, gum treatment, root canals, and extractions. Preventive care is also provided. Treatment is provided to both adults and children. Patients are selected via a lottery and there is often a waiting list.   [facility] [address] [place]  [phone] www.drcivils.com   [facility] [address] [place], [place] [phone], Ext. [phone] Second and Fourth Thursday of each month, opens at 6:30 AM; Clinic ends at 9 AM.  Patients are seen on a first-come first-served basis, and a limited number are seen during each clinic.   [facility]  [address] Hillard Danker [place], [place] [phone]   Eligibility Requirements You must have lived in [place], [place], or [place] counties for at least the last three months.   You cannot be eligible for state or federal sponsored [employer], including [employer], [place], or [employer].   You generally cannot be eligible for healthcare insurance through your employer.    How to apply: Eligibility screenings are held every Tuesday and Wednesday afternoon from 1:00 pm until 4:00 pm. You do not need an appointment for the interview!  [facility] [address], [place], [place]   [place]  [place] Department  [place]  [phone]    Behavioral Health Resources in the Community: Intensive Outpatient Programs Organization         Address  Phone  Notes  [place]. [address] [place], [place] [phone]   [facility] Outpatient [address] [place], [place]   ADS: [facility] [address] [place], [place]   [place]  [address],  [place], [place] or [phone]   Substance Abuse Resources Organization         Address  Phone  Notes  Alcohol and Drug Services  [phone]   [place]  [phone]   [facility] [place]   Chinita Pester  [phone]   Residential & Outpatient Substance Abuse Program  [phone]   Psychological Services Organization         Address  Phone  Notes  [facility]  [address]  [phone]   [place] [address] [place] or [phone]    Mobile Crisis Teams Organization         Address  Phone  Notes  [facility], Mobile Crisis Care Unit  [phone]   [facility]  [address] [place], [place]   [place] Levels [address] [place] [phone]    Self-Help/Support Groups Organization         Address  Phone             Notes  [place]. of [place] - variety of support groups  [place] Call for more information  Narcotics Anonymous (NA), [facility] [address], [employer] [place]  2 meetings at this location   Special educational needs teacher         Address  Phone  Notes  [facility]idential Treatment [place],    [place]  [phone]   [facility]  [address] [place] [phone], [place], [place]   [place], [place] [phone] Admissions: 8am-3pm M-F  Incentives Substance [place] [address]    [place], [place] [phone]   [facility] [address] [place], [place], [place]   [facility] [address],  [place], [place]   [facility] Programs - Intensive Outpatient [address], [place], [place]   [facility] ([place].,   [place], [place] [phone] or [phone]   [facility] (RTS) [address], [place], [place] Accepts Medicaid  [place] [address],  [place] [phone] Substance Abuse/Addiction Treatment   [facility] Organization         Address  Phone  Notes  [facility]  [phone]   Domenic Schwab, PhD [address] Arlis Porta [place], [place]   [phone] or [phone]   [place], [place] [phone]   [facility] [address], [place], [place] [phone] Insurance/Medicaid/sponsorship through [facility] and Families [address]                                    [place], [place] [phone] [place] [address], [place] [phone]    Dr. Adele Schilder  [phone]   [facility] Dept. 1) [address], [place] 2) [place] 3)  [place] [address], [place] [phone]  [phone]   [place] [phone] or [phone] (After Hours)

## 2013-07-23 NOTE — ED Provider Notes (Signed)
Medical screening examination/treatment/procedure(s) were performed by non-physician practitioner and as supervising physician I was immediately available for consultation/collaboration.  EKG Interpretation   None         Sharyon Cable, MD 07/23/13 [phone]

## 2013-09-28 ENCOUNTER — Emergency Department (HOSPITAL_COMMUNITY)
Admission: EM | Admit: 2013-09-28 | Discharge: 2013-09-28 | Disposition: A | Discharge status: Home / Self Care | Payer: BC Managed Care – PPO | Attending: Emergency Medicine | Admitting: Emergency Medicine

## 2013-09-28 ENCOUNTER — Encounter (HOSPITAL_COMMUNITY): Payer: Self-pay | Admitting: Emergency Medicine

## 2013-09-28 DIAGNOSIS — I10 Essential (primary) hypertension: Secondary | ICD-10-CM | POA: Insufficient documentation

## 2013-09-28 DIAGNOSIS — Z7982 Long term (current) use of aspirin: Secondary | ICD-10-CM | POA: Insufficient documentation

## 2013-09-28 DIAGNOSIS — J329 Chronic sinusitis, unspecified: Secondary | ICD-10-CM

## 2013-09-28 MED ORDER — PHENAZOPYRIDINE HCL 100 MG PO TABS: 200.0000 mg | ORAL_TABLET | Freq: Once | ORAL | Status: DC

## 2013-09-28 MED ORDER — TRAMADOL HCL 50 MG PO TABS: 50.0000 mg | ORAL_TABLET | Freq: Once | ORAL | Status: DC

## 2013-09-28 MED ORDER — SULFAMETHOXAZOLE-TRIMETHOPRIM 800-160 MG PO TABS: 1.0000 | ORAL_TABLET | Freq: Two times a day (BID) | ORAL | Status: AC

## 2013-09-28 MED ORDER — PREDNISONE 10 MG PO TABS: 20.0000 mg | ORAL_TABLET | Freq: Every day | ORAL | Status: DC

## 2013-09-28 NOTE — ED Notes (Signed)
"  I think I have a sinus infection".  Green nasal d/c for over 1 week, Says he usually has problem this time of year.  Sl headache, Alert, NAD.    No known fever.

## 2013-09-28 NOTE — ED Notes (Signed)
Pt c/o sinus pressure and cough with greenish colored sputum x 1 week.

## 2013-09-28 NOTE — Discharge Instructions (Signed)

## 2013-09-28 NOTE — ED Provider Notes (Signed)
CSN: [id number]     Arrival date & time 09/28/13  1421 History  This chart was scribed for non-physician practitioner, Lily Kocher, working with Richarda Blade, MD by Terressa Koyanagi, ED Scribe. This patient was seen in room APFT22/APFT22 and the patient's care was started at 3:59 PM.  PCP: No primary provider on file.  Chief Complaint  Patient presents with  . Cough   HPI HPI Comments: [name] is a 58 y.o. male, with a history of HTN, who presents to the Emergency Department complaining of rhinorrhea with green nasal discharge onset one week ago. Pt complains of associated fever and intermittent headache. Pt reports that he has seasonal allergies and reports his current symptoms are similar. Pt reports he has been taking allegra without much relief. Pt denies any sinus surgery or denies ever being hospitalized for similar symptoms.      Past Medical History  Diagnosis Date  . Hypertension    History reviewed. No pertinent past surgical history. No family history on file. History  Substance Use Topics  . Smoking status: Never Smoker   . Smokeless tobacco: Never Used  . Alcohol Use: No    Review of Systems  Constitutional: Positive for fever.  HENT: Positive for rhinorrhea (producing green mucous ).   Neurological: Positive for headaches.    A complete 10 system review of systems was obtained and all systems are negative except as noted in the HPI and PMH.    Allergies  Flexeril and Robaxin  Home Medications   Current Outpatient Rx  Name  Route  Sig  Dispense  Refill  . aspirin EC 325 MG tablet   Oral   Take 325 mg by mouth daily.         . Multiple Vitamin (MULTIVITAMIN WITH MINERALS) TABS tablet   Oral   Take 1 tablet by mouth daily.          BP 139/70  Pulse 71  Temp(Src) 98.2 F (36.8 C) (Oral)  Resp 20  Ht 5\' 6"  (1.676 m)  Wt 259 lb (117.482 kg)  BMI 41.82 kg/m2  SpO2 96% Physical Exam  Nursing note and vitals reviewed. Constitutional:  He is oriented to person, place, and time. He appears well-developed and well-nourished. No distress.  HENT:  Head: Normocephalic and atraumatic.  Uvula enlarged      Eyes: EOM are normal.  Neck: Neck supple. No tracheal deviation present.  Cardiovascular: Normal rate.   Pulmonary/Chest: Effort normal. No respiratory distress.  Airway patent, lungs clear, congestion of the nasal sinus exam  Musculoskeletal: Normal range of motion.  Neurological: He is alert and oriented to person, place, and time.  Skin: Skin is warm and dry.  Psychiatric: He has a normal mood and affect. His behavior is normal.    ED Course  Procedures (including critical care time) DIAGNOSTIC STUDIES: Oxygen Saturation is 96% on RA, adequate by my interpretation.    COORDINATION OF CARE:  4:02 PM-Discussed treatment plan which includes steroids, antibiotics, increased fluids, and follow up with PCP, with pt at bedside and pt agreed to plan.   Labs Review Labs Reviewed - No data to display Imaging Review No results found.   EKG Interpretation None      MDM  No evidence for orbital cellulitis. No injury or trauma. Vial signs stable. Plan - Rx for septra and decadron given to the patient.  Final diagnoses:  None    **I have reviewed nursing notes, vital signs, and all  appropriate lab and imaging results for this patient.*  **I personally performed the services described in this documentation, which was scribed in my presence. The recorded information has been reviewed and is accurate.Lenox Ahr, PA-C 09/28/13 [phone]

## 2013-09-29 NOTE — ED Provider Notes (Signed)
Medical screening examination/treatment/procedure(s) were performed by non-physician practitioner and as supervising physician I was immediately available for consultation/collaboration.  Richarda Blade, MD 09/29/13 [phone]

## 2013-10-28 ENCOUNTER — Encounter (INDEPENDENT_AMBULATORY_CARE_PROVIDER_SITE_OTHER): Payer: Self-pay

## 2013-10-28 ENCOUNTER — Telehealth: Payer: Self-pay

## 2013-10-28 ENCOUNTER — Other Ambulatory Visit: Payer: Self-pay | Admitting: Gastroenterology

## 2013-10-28 ENCOUNTER — Encounter: Payer: Self-pay | Admitting: Gastroenterology

## 2013-10-28 ENCOUNTER — Ambulatory Visit (INDEPENDENT_AMBULATORY_CARE_PROVIDER_SITE_OTHER): Payer: BC Managed Care – PPO | Admitting: Gastroenterology

## 2013-10-28 VITALS — BP 151/75 | HR 68 | Temp 97.8°F | Ht 66.0 in | Wt 258.2 lb

## 2013-10-28 DIAGNOSIS — K429 Umbilical hernia without obstruction or gangrene: Secondary | ICD-10-CM

## 2013-10-28 DIAGNOSIS — Z1211 Encounter for screening for malignant neoplasm of colon: Secondary | ICD-10-CM | POA: Insufficient documentation

## 2013-10-28 NOTE — Progress Notes (Signed)
NO PCP

## 2013-10-28 NOTE — Progress Notes (Signed)
   Subjective:    Patient ID: [name], male    DOB: 1956/05/24, 58 y.o.   MRN: [id number]  No primary provider on file.  HPI  HERNIA AROUND NAVEL. Doesn't hurt. NOW EXERCISING AND TRYING TO LOSE WEIGHT.BMs: IN AM AND IN EVENING. PT DENIES FEVER, CHILLS, BRBPR, nausea, vomiting, melena, diarrhea, constipation, abd pain, problems swallowing, problems with sedation, OR heartburn or indigestion.   Past Medical History  Diagnosis Date  . Hypertension    Past Surgical History  Procedure Laterality Date  . Eye surgery      Allergies  Allergen Reactions  . Flexeril [Cyclobenzaprine]     Can not take any muscle relaxer causes respirator depression   . Robaxin [Methocarbamol]     Can not take any muscle relaxer causes respirator depression    Current Outpatient Prescriptions  Medication Sig Dispense Refill  . aspirin EC 325 MG tablet Take 325 mg by mouth daily.      . Multiple Vitamin (MULTIVITAMIN WITH MINERALS) TABS tablet Take 1 tablet by mouth daily.      .       Family History  Problem Relation Age of Onset  . Colon cancer Neg Hx   . Colon polyps Neg Hx     History  Substance Use Topics  . Smoking status: Never Smoker   . Smokeless tobacco: Never Used  . Alcohol Use: No        Review of Systems PER HPI OTHERWISE ALL SYSTEMS ARE NEGATIVE. WAS ON DIOVAN.     Objective:   Physical Exam  Vitals reviewed. Constitutional: He is oriented to person, place, and time. He appears well-developed. No distress.  HENT:  Head: Normocephalic and atraumatic.  Mouth/Throat: Oropharynx is clear and moist. No oropharyngeal exudate.  Eyes: Pupils are equal, round, and reactive to light. No scleral icterus.  Neck: Normal range of motion. Neck supple.  Cardiovascular: Normal rate, regular rhythm and normal heart sounds.   Pulmonary/Chest: Effort normal and breath sounds normal. No respiratory distress.  Abdominal: Soft. Bowel sounds are normal. He exhibits no distension.  There is no tenderness.  REDUCIBLE UMBILICAL BULGE  Musculoskeletal: He exhibits edema (TRACE BIL LE).  Lymphadenopathy:    He has no cervical adenopathy.  Neurological: He is alert and oriented to person, place, and time.  NO  NEW FOCAL DEFICITS   Psychiatric: He has a normal mood and affect.          Assessment & Plan:

## 2013-10-28 NOTE — Patient Instructions (Signed)
CONTINUE YOUR WEIGHT LOSS EFFORTS.  FOLLOW A HIGH FIBER/LOW FAT DIET. SEE INFO BELOW.  I WILL REFER YOU TO DR. Arnoldo Morale TO FIX YOUR HERNIA.  FOLLOW UP IN 1 YEAR.    High-Fiber Diet A high-fiber diet changes your normal diet to include more whole grains, legumes, fruits, and vegetables. Changes in the diet involve replacing refined carbohydrates with unrefined foods. The calorie level of the diet is essentially unchanged. The Dietary Reference Intake (recommended amount) for adult males is 38 grams per day. For adult females, it is 25 grams per day. Pregnant and lactating women should consume 28 grams of fiber per day. Fiber is the intact part of a plant that is not broken down during digestion. Functional fiber is fiber that has been isolated from the plant to provide a beneficial effect in the body. PURPOSE  Increase stool bulk.   Ease and regulate bowel movements.   Lower cholesterol.  INDICATIONS THAT YOU NEED MORE FIBER  Constipation and hemorrhoids.   Uncomplicated diverticulosis (intestine condition) and irritable bowel syndrome.   Weight management.   As a protective measure against hardening of the arteries (atherosclerosis), diabetes, and cancer.   GUIDELINES FOR INCREASING FIBER IN THE DIET  Start adding fiber to the diet slowly. A gradual increase of about 5 more grams (2 slices of whole-wheat bread, 2 servings of most fruits or vegetables, or 1 bowl of high-fiber cereal) per day is best. Too rapid an increase in fiber may result in constipation, flatulence, and bloating.   Drink enough water and fluids to keep your urine clear or pale yellow. Water, juice, or caffeine-free drinks are recommended. Not drinking enough fluid may cause constipation.   Eat a variety of high-fiber foods rather than one type of fiber.   Try to increase your intake of fiber through using high-fiber foods rather than fiber pills or supplements that contain small amounts of fiber.   The goal  is to change the types of food eaten. Do not supplement your present diet with high-fiber foods, but replace foods in your present diet.  INCLUDE A VARIETY OF FIBER SOURCES  Replace refined and processed grains with whole grains, canned fruits with fresh fruits, and incorporate other fiber sources. White rice, white breads, and most bakery goods contain little or no fiber.   Brown whole-grain rice, buckwheat oats, and many fruits and vegetables are all good sources of fiber. These include: broccoli, Brussels sprouts, cabbage, cauliflower, beets, sweet potatoes, white potatoes (skin on), carrots, tomatoes, eggplant, squash, berries, fresh fruits, and dried fruits.   Cereals appear to be the richest source of fiber. Cereal fiber is found in whole grains and bran. Bran is the fiber-rich outer coat of cereal grain, which is largely removed in refining. In whole-grain cereals, the bran remains. In breakfast cereals, the largest amount of fiber is found in those with "bran" in their names. The fiber content is sometimes indicated on the label.   You may need to include additional fruits and vegetables each day.   In baking, for 1 cup white flour, you may use the following substitutions:   1 cup whole-wheat flour minus 2 tablespoons.   1/2 cup white flour plus 1/2 cup whole-wheat flour.   Low-Fat Diet BREADS, CEREALS, PASTA, RICE, DRIED PEAS, AND BEANS These products are high in carbohydrates and most are low in fat. Therefore, they can be increased in the diet as substitutes for fatty foods. They too, however, contain calories and should not be eaten in  excess. Cereals can be eaten for snacks as well as for breakfast.   FRUITS AND VEGETABLES It is good to eat fruits and vegetables. Besides being sources of fiber, both are rich in vitamins and some minerals. They help you get the daily allowances of these nutrients. Fruits and vegetables can be used for snacks and desserts.  MEATS Limit lean meat,  chicken, [place], and fish to no more than 6 ounces per day. Beef, Pork, and Lamb Use lean cuts of beef, pork, and lamb. Lean cuts include:  Extra-lean ground beef.  Arm roast.  Sirloin tip.  Center-cut ham.  Round steak.  Loin chops.  Rump roast.  Tenderloin.  Trim all fat off the outside of meats before cooking. It is not necessary to severely decrease the intake of red meat, but lean choices should be made. Lean meat is rich in protein and contains a highly absorbable form of iron. Premenopausal women, in particular, should avoid reducing lean red meat because this could increase the risk for low red blood cells (iron-deficiency anemia).  Chicken and [place] These are good sources of protein. The fat of poultry can be reduced by removing the skin and underlying fat layers before cooking. Chicken and [place] can be substituted for lean red meat in the diet. Poultry should not be fried or covered with high-fat sauces. Fish and Shellfish Fish is a good source of protein. Shellfish contain cholesterol, but they usually are low in saturated fatty acids. The preparation of fish is important. Like chicken and [place], they should not be fried or covered with high-fat sauces. EGGS Egg whites contain no fat or cholesterol. They can be eaten often. Try 1 to 2 egg whites instead of whole eggs in recipes or use egg substitutes that do not contain yolk. MILK AND DAIRY PRODUCTS Use skim or 1% milk instead of 2% or whole milk. Decrease whole milk, natural, and processed cheeses. Use nonfat or low-fat (2%) cottage cheese or low-fat cheeses made from vegetable oils. Choose nonfat or low-fat (1 to 2%) yogurt. Experiment with evaporated skim milk in recipes that call for heavy cream. Substitute low-fat yogurt or low-fat cottage cheese for sour cream in dips and salad dressings. Have at least 2 servings of low-fat dairy products, such as 2 glasses of skim (or 1%) milk each day to help get your daily calcium  intake. FATS AND OILS Reduce the total intake of fats, especially saturated fat. Butterfat, lard, and beef fats are high in saturated fat and cholesterol. These should be avoided as much as possible. Vegetable fats do not contain cholesterol, but certain vegetable fats, such as coconut oil, palm oil, and palm kernel oil are very high in saturated fats. These should be limited. These fats are often used in bakery goods, processed foods, popcorn, oils, and nondairy creamers. Vegetable shortenings and some peanut butters contain hydrogenated oils, which are also saturated fats. Read the labels on these foods and check for saturated vegetable oils. Unsaturated vegetable oils and fats do not raise blood cholesterol. However, they should be limited because they are fats and are high in calories. Total fat should still be limited to 30% of your daily caloric intake. Desirable liquid vegetable oils are corn oil, cottonseed oil, olive oil, canola oil, safflower oil, soybean oil, and sunflower oil. Peanut oil is not as good, but small amounts are acceptable. Buy a heart-healthy tub margarine that has no partially hydrogenated oils in the ingredients. Mayonnaise and salad dressings often are made from unsaturated  fats, but they should also be limited because of their high calorie and fat content. Seeds, nuts, peanut butter, olives, and avocados are high in fat, but the fat is mainly the unsaturated type. These foods should be limited mainly to avoid excess calories and fat. OTHER EATING TIPS Snacks  Most sweets should be limited as snacks. They tend to be rich in calories and fats, and their caloric content outweighs their nutritional value. Some good choices in snacks are graham crackers, melba toast, soda crackers, bagels (no egg), English muffins, fruits, and vegetables. These snacks are preferable to snack crackers, [place] fries, TORTILLA CHIPS, and POTATO chips. Popcorn should be air-popped or cooked in small  amounts of liquid vegetable oil. Desserts Eat fruit, low-fat yogurt, and fruit ices instead of pastries, cake, and cookies. Sherbet, angel food cake, gelatin dessert, frozen low-fat yogurt, or other frozen products that do not contain saturated fat (pure fruit juice bars, frozen ice pops) are also acceptable.  COOKING METHODS Choose those methods that use little or no fat. They include: Poaching.  Braising.  Steaming.  Grilling.  Baking.  Stir-frying.  Broiling.  Microwaving.  Foods can be cooked in a nonstick pan without added fat, or use a nonfat cooking spray in regular cookware. Limit fried foods and avoid frying in saturated fat. Add moisture to lean meats by using water, broth, cooking wines, and other nonfat or low-fat sauces along with the cooking methods mentioned above. Soups and stews should be chilled after cooking. The fat that forms on top after a few hours in the refrigerator should be skimmed off. When preparing meals, avoid using excess salt. Salt can contribute to raising blood pressure in some people.  EATING AWAY FROM HOME Order entres, potatoes, and vegetables without sauces or butter. When meat exceeds the size of a deck of cards (3 to 4 ounces), the rest can be taken home for another meal. Choose vegetable or fruit salads and ask for low-calorie salad dressings to be served on the side. Use dressings sparingly. Limit high-fat toppings, such as bacon, crumbled eggs, cheese, sunflower seeds, and olives. Ask for heart-healthy tub margarine instead of butter.

## 2013-10-28 NOTE — Telephone Encounter (Signed)
I called Dr. Josue Hector office and spoke to [facility]. She said they are accepting new pts.  Tried to call pt to inform. ( He has the phone number and the address).  Per Golda Acre, please fax demographics to them to speed up the process.

## 2013-10-28 NOTE — Assessment & Plan Note (Signed)
NO WARNING SIGNS/SYMPTOMS. BULGE IS GETTING BIGGER.  REFER TO DR. Arnoldo Morale TO CONSIDER UMBILICAL HERNIA REPAIR [facility] IN 1 YEAR

## 2013-10-30 NOTE — Progress Notes (Signed)
Reminder in epic °

## 2014-09-21 ENCOUNTER — Encounter: Payer: Self-pay | Admitting: Gastroenterology

## 2015-08-11 ENCOUNTER — Emergency Department (HOSPITAL_COMMUNITY)
Admission: EM | Admit: 2015-08-11 | Discharge: 2015-08-11 | Disposition: A | Discharge status: Home / Self Care | Payer: BLUE CROSS/BLUE SHIELD | Attending: Emergency Medicine | Admitting: Emergency Medicine

## 2015-08-11 ENCOUNTER — Encounter (HOSPITAL_COMMUNITY): Payer: Self-pay

## 2015-08-11 DIAGNOSIS — Z79899 Other long term (current) drug therapy: Secondary | ICD-10-CM | POA: Insufficient documentation

## 2015-08-11 DIAGNOSIS — R110 Nausea: Secondary | ICD-10-CM | POA: Diagnosis not present

## 2015-08-11 DIAGNOSIS — R42 Dizziness and giddiness: Secondary | ICD-10-CM | POA: Insufficient documentation

## 2015-08-11 DIAGNOSIS — J029 Acute pharyngitis, unspecified: Secondary | ICD-10-CM | POA: Insufficient documentation

## 2015-08-11 DIAGNOSIS — I10 Essential (primary) hypertension: Secondary | ICD-10-CM | POA: Diagnosis not present

## 2015-08-11 DIAGNOSIS — Z7982 Long term (current) use of aspirin: Secondary | ICD-10-CM | POA: Diagnosis not present

## 2015-08-11 DIAGNOSIS — B349 Viral infection, unspecified: Secondary | ICD-10-CM | POA: Diagnosis not present

## 2015-08-11 DIAGNOSIS — R1013 Epigastric pain: Secondary | ICD-10-CM | POA: Diagnosis present

## 2015-08-11 LAB — CBC WITH DIFFERENTIAL/PLATELET
BASOS ABS: 0 10*3/uL (ref 0.0–0.1)
Basophils Relative: 0 %
EOS ABS: 0.1 10*3/uL (ref 0.0–0.7)
Eosinophils Relative: 1 %
HCT: 40.4 % (ref 39.0–52.0)
HEMOGLOBIN: 13.4 g/dL (ref 13.0–17.0)
LYMPHS ABS: 3.5 10*3/uL (ref 0.7–4.0)
LYMPHS PCT: 37 %
MCH: 31.1 pg (ref 26.0–34.0)
MCHC: 33.2 g/dL (ref 30.0–36.0)
MCV: 93.7 fL (ref 78.0–100.0)
Monocytes Absolute: 0.6 10*3/uL (ref 0.1–1.0)
Monocytes Relative: 7 %
NEUTROS PCT: 55 %
Neutro Abs: 5.3 10*3/uL (ref 1.7–7.7)
PLATELETS: 225 10*3/uL (ref 150–400)
RBC: 4.31 MIL/uL (ref 4.22–5.81)
RDW: 12.8 % (ref 11.5–15.5)
WBC: 9.6 10*3/uL (ref 4.0–10.5)

## 2015-08-11 LAB — COMPREHENSIVE METABOLIC PANEL
ALT: 30 U/L (ref 17–63)
ANION GAP: 8 (ref 5–15)
AST: 22 U/L (ref 15–41)
Albumin: 3.9 g/dL (ref 3.5–5.0)
Alkaline Phosphatase: 68 U/L (ref 38–126)
BUN: 11 mg/dL (ref 6–20)
CHLORIDE: 103 mmol/L (ref 101–111)
CO2: 26 mmol/L (ref 22–32)
CREATININE: 0.91 mg/dL (ref 0.61–1.24)
Calcium: 8.8 mg/dL — ABNORMAL LOW (ref 8.9–10.3)
Glucose, Bld: 106 mg/dL — ABNORMAL HIGH (ref 65–99)
POTASSIUM: 3.8 mmol/L (ref 3.5–5.1)
SODIUM: 137 mmol/L (ref 135–145)
Total Bilirubin: 0.2 mg/dL — ABNORMAL LOW (ref 0.3–1.2)
Total Protein: 7.2 g/dL (ref 6.5–8.1)

## 2015-08-11 MED ORDER — ONDANSETRON 4 MG PO TBDP: ORAL_TABLET | ORAL | Status: DC

## 2015-08-11 MED ORDER — DICYCLOMINE HCL 10 MG PO CAPS
10.0000 mg | ORAL_CAPSULE | Freq: Once | ORAL | Status: DC
  Filled 2015-08-11: qty 1

## 2015-08-11 MED ORDER — DICYCLOMINE HCL 20 MG PO TABS: ORAL_TABLET | ORAL | Status: DC

## 2015-08-11 MED ORDER — ONDANSETRON 4 MG PO TBDP
4.0000 mg | ORAL_TABLET | Freq: Once | ORAL | Status: AC
  Administered 2015-08-11: 4 mg via ORAL
  Filled 2015-08-11: qty 1

## 2015-08-11 NOTE — Discharge Instructions (Signed)
Follow up with your md next week if not improving °

## 2015-08-11 NOTE — ED Notes (Signed)
C/o nausea, diarrhea, and abdominal discomfort.

## 2015-08-11 NOTE — ED Provider Notes (Signed)
CSN: [id number]     Arrival date & time 08/11/15  2018 History  By signing my name below, I, Helane Gunther, attest that this documentation has been prepared under the direction and in the presence of Milton Ferguson, MD. Electronically Signed: Helane Gunther, ED Scribe. 08/11/2015. 9:08 PM.     Chief Complaint  Patient presents with  . Abdominal Pain   Patient is a 60 y.o. male presenting with abdominal pain.  Abdominal Pain Pain location:  Epigastric Pain quality: cramping   Pain radiates to:  Does not radiate Pain severity:  Mild Onset quality:  Gradual Timing:  Constant Progression:  Unchanged Chronicity:  New Relieved by:  None tried Worsened by:  Nothing tried Ineffective treatments:  None tried Associated symptoms: cough, diarrhea, nausea and sore throat   Associated symptoms: no chest pain, no fatigue, no hematuria and no vomiting   Cough:    Severity:  Mild   Onset quality:  Gradual   Timing:  Intermittent   Chronicity:  New Diarrhea:    Number of occurrences:  1   Severity:  Mild Nausea:    Severity:  Mild   Onset quality:  Gradual   Timing:  Constant   Progression:  Unchanged Sore throat:    Severity:  Mild   Onset quality:  Gradual   Timing:  Constant   Progression:  Unchanged  HPI Comments: [name] is a 61 y.o. male with a PMHx of HTN who presents to the Emergency Department complaining of epigastric cramping onset today. He reports associated mild dizziness, scratchy throat, mild cough, nausea, and one episode of diarrhea today. He states he feels as though he is "coming down with something." Pt denies vomiting.   Past Medical History  Diagnosis Date  . Hypertension    Past Surgical History  Procedure Laterality Date  . Eye surgery    . Vasectomy     Family History  Problem Relation Age of Onset  . Colon cancer Neg Hx   . Colon polyps Neg Hx    Social History  Substance Use Topics  . Smoking status: Never Smoker   . Smokeless tobacco:  Never Used  . Alcohol Use: No    Review of Systems  Constitutional: Negative for appetite change and fatigue.  HENT: Positive for sore throat. Negative for congestion, ear discharge and sinus pressure.   Eyes: Negative for discharge.  Respiratory: Positive for cough.   Cardiovascular: Negative for chest pain.  Gastrointestinal: Positive for nausea, abdominal pain and diarrhea. Negative for vomiting.  Genitourinary: Negative for frequency and hematuria.  Musculoskeletal: Negative for back pain.  Skin: Negative for rash.  Neurological: Positive for dizziness. Negative for seizures and headaches.  Psychiatric/Behavioral: Negative for hallucinations.    Allergies  Flexeril and Robaxin  Home Medications   Prior to Admission medications   Medication Sig Start Date End Date Taking? Authorizing Provider  aspirin EC 325 MG tablet Take 325 mg by mouth daily.   Yes Historical Provider, MD  Multiple Vitamin (MULTIVITAMIN WITH MINERALS) TABS tablet Take 1 tablet by mouth daily.   Yes Historical Provider, MD   BP 151/70 mmHg  Pulse 69  Temp(Src) 97.9 F (36.6 C) (Oral)  Resp 20  Ht 5\' 6"  (1.676 m)  Wt 258 lb (117.028 kg)  BMI 41.66 kg/m2  SpO2 100% Physical Exam  Constitutional: He is oriented to person, place, and time. He appears well-developed.  HENT:  Head: Normocephalic.  Eyes: Conjunctivae and EOM are normal. No scleral  icterus.  Neck: Neck supple. No thyromegaly present.  Cardiovascular: Normal rate and regular rhythm.  Exam reveals no gallop and no friction rub.   No murmur heard. Pulmonary/Chest: No stridor. He has no wheezes. He has no rales. He exhibits no tenderness.  Abdominal: He exhibits no distension. There is no tenderness. There is no rebound.  Musculoskeletal: Normal range of motion. He exhibits no edema.  Lymphadenopathy:    He has no cervical adenopathy.  Neurological: He is oriented to person, place, and time. He exhibits normal muscle tone. Coordination  normal.  Skin: No rash noted. No erythema.  Psychiatric: He has a normal mood and affect. His behavior is normal.    ED Course  Procedures  DIAGNOSTIC STUDIES: Oxygen Saturation is 100% on RA, normal by my interpretation.    COORDINATION OF CARE: 9:03 PM - Discussed plans to order diagnostic studies and write a note for work. Pt advised of plan for treatment and pt agrees.  Labs Review Labs Reviewed  COMPREHENSIVE METABOLIC PANEL - Abnormal; Notable for the following:    Glucose, Bld 106 (*)    Calcium 8.8 (*)    Total Bilirubin 0.2 (*)    All other components within normal limits  CBC WITH DIFFERENTIAL/PLATELET    Imaging Review No results found. I have personally reviewed and evaluated these images and lab results as part of my medical decision-making.   EKG Interpretation None      MDM   Final diagnoses:  None    Nausea with some diarrhea most likely caused by virus labs unremarkable patient will be treated with Zofran and Bentyl   The chart was scribed for me under my direct supervision.  I personally performed the history, physical, and medical decision making and all procedures in the evaluation of this patient.Milton Ferguson, MD 08/11/15 2204

## 2015-08-31 ENCOUNTER — Encounter (HOSPITAL_COMMUNITY): Payer: Self-pay | Admitting: Emergency Medicine

## 2015-08-31 ENCOUNTER — Emergency Department (HOSPITAL_COMMUNITY)
Admission: EM | Admit: 2015-08-31 | Discharge: 2015-08-31 | Disposition: A | Discharge status: Home / Self Care | Payer: BLUE CROSS/BLUE SHIELD | Attending: Emergency Medicine | Admitting: Emergency Medicine

## 2015-08-31 DIAGNOSIS — R509 Fever, unspecified: Secondary | ICD-10-CM | POA: Diagnosis present

## 2015-08-31 DIAGNOSIS — I10 Essential (primary) hypertension: Secondary | ICD-10-CM | POA: Insufficient documentation

## 2015-08-31 DIAGNOSIS — Z79899 Other long term (current) drug therapy: Secondary | ICD-10-CM | POA: Insufficient documentation

## 2015-08-31 DIAGNOSIS — B349 Viral infection, unspecified: Secondary | ICD-10-CM | POA: Diagnosis not present

## 2015-08-31 DIAGNOSIS — Z7982 Long term (current) use of aspirin: Secondary | ICD-10-CM | POA: Diagnosis not present

## 2015-08-31 MED ORDER — IBUPROFEN 400 MG PO TABS: 400.0000 mg | ORAL_TABLET | Freq: Four times a day (QID) | ORAL | Status: DC | PRN

## 2015-08-31 MED ORDER — PSEUDOEPHEDRINE HCL 30 MG PO TABS: 30.0000 mg | ORAL_TABLET | ORAL | Status: DC | PRN

## 2015-08-31 MED ORDER — ONDANSETRON 4 MG PO TBDP: ORAL_TABLET | ORAL | Status: DC

## 2015-08-31 NOTE — ED Provider Notes (Signed)
CSN: [id number]     Arrival date & time 08/31/15  2055 History  By signing my name below, I, [name], attest that this documentation has been prepared under the direction and in the presence of No att. providers found. Electronically Signed: [name], ED Scribe. 08/31/2015. 9:59 PM.    Chief Complaint  Patient presents with  . Fever   The history is provided by the patient. No language interpreter was used.   HPI Comments: [name] is a 60 y.o. male with a PMHx of HTN who presents to the Emergency Department complaining of a low grade fever for the past couple of days. Pt also reports body aches, congestion, mild diarrhea, nausea and cough. Pt states he was recently sick with the "flu'" and feels he might be having a reoccurring episode. Pt denies being on Tamiflu. Pt denies sinus pain, vomiting, sore throat or any other symptoms at this time.   Past Medical History  Diagnosis Date  . Hypertension    Past Surgical History  Procedure Laterality Date  . Eye surgery    . Vasectomy     Family History  Problem Relation Age of Onset  . Colon cancer Neg Hx   . Colon polyps Neg Hx    Social History  Substance Use Topics  . Smoking status: Never Smoker   . Smokeless tobacco: Never Used  . Alcohol Use: No    Review of Systems  Constitutional: Positive for fever.  HENT: Positive for congestion. Negative for sinus pressure and sore throat.   Respiratory: Positive for cough.   Gastrointestinal: Positive for nausea and diarrhea. Negative for vomiting.  Musculoskeletal: Positive for myalgias.  All other systems reviewed and are negative.  Allergies  Flexeril and Robaxin  Home Medications   Prior to Admission medications   Medication Sig Start Date End Date Taking? Authorizing Provider  aspirin EC 325 MG tablet Take 325 mg by mouth daily.   Yes Historical Provider, MD  Multiple Vitamin (MULTIVITAMIN WITH MINERALS) TABS tablet Take 1 tablet by mouth daily.   Yes  Historical Provider, MD  ibuprofen (ADVIL,MOTRIN) 400 MG tablet Take 1 tablet (400 mg total) by mouth every 6 (six) hours as needed. 08/31/15   Cameron Pew, MD  ondansetron (ZOFRAN ODT) 4 MG disintegrating tablet 4mg  ODT q4 hours prn nausea/vomit 08/31/15   Cameron Pew, MD  pseudoephedrine (SUDAFED) 30 MG tablet Take 1 tablet (30 mg total) by mouth every 4 (four) hours as needed for congestion. 08/31/15   Corene Cornea Kellyn Mansfield, MD   BP 141/56 mmHg  Pulse 80  Temp(Src) 99.8 F (37.7 C)  Resp 18  Ht 5\' 6"  (1.676 m)  Wt 240 lb (108.863 kg)  BMI 38.76 kg/m2  SpO2 99%   Physical Exam  Constitutional: He is oriented to person, place, and time. He appears well-developed and well-nourished.  HENT:  Head: Normocephalic and atraumatic.  Nose: Rhinorrhea present.  Mouth/Throat: Posterior oropharyngeal erythema present.  Nasal mucosal edema  Cardiovascular: Normal rate.   Pulmonary/Chest: Effort normal.  Abdominal: Soft. He exhibits no distension. There is no tenderness.  Musculoskeletal: He exhibits no tenderness.  Lymphadenopathy:    He has no cervical adenopathy.  Neurological: He is alert and oriented to person, place, and time.  Skin: Skin is warm and dry. No rash noted.  Psychiatric: He has a normal mood and affect.  Nursing note and vitals reviewed.   ED Course  Procedures   DIAGNOSTIC STUDIES: Oxygen Saturation is 99% on room air,  normal by my interpretation.    COORDINATION OF CARE: 9:52 PM Discussed treatment plan with pt at bedside and pt agreed to plan.   MDM   Final diagnoses:  Viral syndrome    Likely viral uri. VS WNL, no hypoxia or tachypnea. Some sinus symptoms, will rx sudafed/zofran/ibuprofen for symptoms. No indication for antibiotics or imaging at this time.    I personally performed the services described in this documentation, which was scribed in my presence. The recorded information has been reviewed and is accurate.    Cameron Pew, MD 09/01/15 [phone]

## 2015-08-31 NOTE — ED Notes (Signed)
Pt c/o fever, stuffy nose and body aches for a couple of days.

## 2016-07-01 ENCOUNTER — Encounter (HOSPITAL_COMMUNITY): Payer: Self-pay | Admitting: *Deleted

## 2016-07-01 ENCOUNTER — Emergency Department (HOSPITAL_COMMUNITY)
Admission: EM | Admit: 2016-07-01 | Discharge: 2016-07-02 | Disposition: A | Discharge status: Home / Self Care | Payer: BLUE CROSS/BLUE SHIELD | Attending: Emergency Medicine | Admitting: Emergency Medicine

## 2016-07-01 DIAGNOSIS — K529 Noninfective gastroenteritis and colitis, unspecified: Secondary | ICD-10-CM | POA: Diagnosis not present

## 2016-07-01 DIAGNOSIS — Z7982 Long term (current) use of aspirin: Secondary | ICD-10-CM | POA: Diagnosis not present

## 2016-07-01 DIAGNOSIS — Z791 Long term (current) use of non-steroidal anti-inflammatories (NSAID): Secondary | ICD-10-CM | POA: Diagnosis not present

## 2016-07-01 DIAGNOSIS — I10 Essential (primary) hypertension: Secondary | ICD-10-CM | POA: Insufficient documentation

## 2016-07-01 DIAGNOSIS — R1110 Vomiting, unspecified: Secondary | ICD-10-CM | POA: Diagnosis present

## 2016-07-01 DIAGNOSIS — R05 Cough: Secondary | ICD-10-CM | POA: Diagnosis not present

## 2016-07-01 DIAGNOSIS — H9201 Otalgia, right ear: Secondary | ICD-10-CM | POA: Insufficient documentation

## 2016-07-01 LAB — CBC WITH DIFFERENTIAL/PLATELET
BASOS PCT: 0 %
Basophils Absolute: 0 10*3/uL (ref 0.0–0.1)
EOS ABS: 0 10*3/uL (ref 0.0–0.7)
Eosinophils Relative: 0 %
HEMATOCRIT: 42.8 % (ref 39.0–52.0)
HEMOGLOBIN: 13.9 g/dL (ref 13.0–17.0)
Lymphocytes Relative: 4 %
Lymphs Abs: 0.5 10*3/uL — ABNORMAL LOW (ref 0.7–4.0)
MCH: 31.2 pg (ref 26.0–34.0)
MCHC: 32.5 g/dL (ref 30.0–36.0)
MCV: 96.2 fL (ref 78.0–100.0)
MONOS PCT: 5 %
Monocytes Absolute: 0.7 10*3/uL (ref 0.1–1.0)
NEUTROS ABS: 12 10*3/uL — AB (ref 1.7–7.7)
NEUTROS PCT: 91 %
Platelets: 222 10*3/uL (ref 150–400)
RBC: 4.45 MIL/uL (ref 4.22–5.81)
RDW: 13.1 % (ref 11.5–15.5)
WBC: 13.3 10*3/uL — AB (ref 4.0–10.5)

## 2016-07-01 LAB — COMPREHENSIVE METABOLIC PANEL
ALBUMIN: 4 g/dL (ref 3.5–5.0)
ALK PHOS: 62 U/L (ref 38–126)
ALT: 47 U/L (ref 17–63)
ANION GAP: 7 (ref 5–15)
AST: 30 U/L (ref 15–41)
BILIRUBIN TOTAL: 0.7 mg/dL (ref 0.3–1.2)
BUN: 14 mg/dL (ref 6–20)
CALCIUM: 8.9 mg/dL (ref 8.9–10.3)
CO2: 28 mmol/L (ref 22–32)
CREATININE: 0.95 mg/dL (ref 0.61–1.24)
Chloride: 104 mmol/L (ref 101–111)
GFR calc Af Amer: >60 mL/min (ref >60)
GFR calc non Af Amer: >60 mL/min (ref >60)
GLUCOSE: 121 mg/dL — AB (ref 65–99)
Potassium: 3.8 mmol/L (ref 3.5–5.1)
SODIUM: 139 mmol/L (ref 135–145)
TOTAL PROTEIN: 7.8 g/dL (ref 6.5–8.1)

## 2016-07-01 LAB — LIPASE, BLOOD: Lipase: 28 U/L (ref 11–51)

## 2016-07-01 MED ORDER — SODIUM CHLORIDE 0.9 % IV SOLN
INTRAVENOUS | Status: DC
  Administered 2016-07-01 (×2): via INTRAVENOUS

## 2016-07-01 MED ORDER — LOPERAMIDE HCL 2 MG PO TABS: 2.0000 mg | ORAL_TABLET | Freq: Four times a day (QID) | ORAL | 0 refills | Status: DC | PRN

## 2016-07-01 MED ORDER — ONDANSETRON HCL 4 MG/2ML IJ SOLN
4.0000 mg | Freq: Once | INTRAMUSCULAR | Status: AC
  Administered 2016-07-02: 4 mg via INTRAVENOUS
  Filled 2016-07-01: qty 2

## 2016-07-01 MED ORDER — ONDANSETRON HCL 4 MG/2ML IJ SOLN
4.0000 mg | Freq: Once | INTRAMUSCULAR | Status: AC
  Administered 2016-07-01: 4 mg via INTRAVENOUS
  Filled 2016-07-01: qty 2

## 2016-07-01 MED ORDER — PROMETHAZINE HCL 25 MG PO TABS: 25.0000 mg | ORAL_TABLET | Freq: Four times a day (QID) | ORAL | 1 refills | Status: DC | PRN

## 2016-07-01 MED ORDER — SODIUM CHLORIDE 0.9 % IV BOLUS (SEPSIS)
500.0000 mL | Freq: Once | INTRAVENOUS | Status: AC
  Administered 2016-07-01: 500 mL via INTRAVENOUS

## 2016-07-01 NOTE — ED Triage Notes (Signed)
Pt reports 1 episode of emesis, 1 episode of diarrhea, chills, headaches, and stomach pain (queasy). Pt states the vomiting and diarrhea started after eating a fish sandwich.

## 2016-07-01 NOTE — Discharge Instructions (Signed)
The patient was sudden onset of vomiting and diarrhea. Most likely consistent with a viral gastroenteritis. Doubt food poisoning. Will treat symptomatically with Imodium AD and Phenergan. Return for any new or worse symptoms. Or if not improving over the next 24 hours.

## 2016-07-01 NOTE — ED Provider Notes (Signed)
[facility] DEPT Provider Note   CSN: [id number] Arrival date & time: 07/01/16  2040  By signing my name below, I, [name], attest that this documentation has been prepared under the direction and in the presence of [name], MD. Electronically Signed: [name], ED Scribe. 07/01/16. 10:11 PM.  History   Chief Complaint Chief Complaint  Patient presents with  . Emesis    HPI [name] is a 60 y.o. male with a PMHx of HTN, who presents to the Emergency Department brought in by EMS complaining of emesis x 1 episode onset 1 hour ago PTA. Pt notes that he ate a fish sandwich prior to the onset of his symptoms. Pt was at his baseline this morning. Pt reports that his wife had a similar meal and she is currently at her baseline. He is having associated symptoms of diarrhea x 1 episode, mild generalized abdominal pain, chills, nausea, generalized body aches, HA, nasal congestion, and right ear pain. He has not tried any medications for the relief of his symptoms. He denies blood in stool, fever, vision change, rhinorrhea, sore throat, CP, SOB, dysuria, hematuria, rash, joint swelling, bruises/bleeds easily, confusion, and any other symptoms. Denies sick contacts. Denies anticoagulant use.  The history is provided by the patient. No language interpreter was used.  Emesis   This is a new problem. The current episode started 1 to 2 hours ago. The problem has not changed since onset.There has been no fever. Associated symptoms include abdominal pain, chills, cough, diarrhea, headaches and myalgias. Pertinent negatives include no fever.    Past Medical History:  Diagnosis Date  . Hypertension     Patient Active Problem List   Diagnosis Date Noted  . Colon cancer screening 10/28/2013  . Umbilical hernia without mention of obstruction or gangrene 10/28/2013    Past Surgical History:  Procedure Laterality Date  . EYE SURGERY    . VASECTOMY         Home  Medications    Prior to Admission medications   Medication Sig Start Date End Date Taking? Authorizing Provider  aspirin EC 325 MG tablet Take 325 mg by mouth daily.   Yes Historical Provider, MD  Multiple Vitamin (MULTIVITAMIN WITH MINERALS) TABS tablet Take 1 tablet by mouth daily.   Yes Historical Provider, MD  ibuprofen (ADVIL,MOTRIN) 400 MG tablet Take 1 tablet (400 mg total) by mouth every 6 (six) hours as needed. 08/31/15   Merrily Pew, MD  loperamide (IMODIUM A-D) 2 MG tablet Take 1 tablet (2 mg total) by mouth 4 (four) times daily as needed for diarrhea or loose stools. 07/01/16   [name], MD  promethazine (PHENERGAN) 25 MG tablet Take 1 tablet (25 mg total) by mouth every 6 (six) hours as needed. 07/01/16   [name], MD    Family History Family History  Problem Relation Age of Onset  . Colon cancer Neg Hx   . Colon polyps Neg Hx     Social History Social History  Substance Use Topics  . Smoking status: Never Smoker  . Smokeless tobacco: Never Used  . Alcohol use No     Allergies   Flexeril [cyclobenzaprine] and Robaxin [methocarbamol]   Review of Systems Review of Systems  Constitutional: Positive for chills. Negative for fever.  HENT: Positive for congestion and ear pain (right). Negative for rhinorrhea and sore throat.   Eyes: Negative for visual disturbance.  Respiratory: Positive for cough. Negative for shortness of breath.   Cardiovascular: Negative  for chest pain.  Gastrointestinal: Positive for abdominal pain, diarrhea, nausea and vomiting. Negative for blood in stool.  Genitourinary: Negative for dysuria and hematuria.  Musculoskeletal: Positive for myalgias. Negative for joint swelling.  Skin: Negative for rash.  Neurological: Positive for headaches. Negative for weakness and numbness.  Hematological: Does not bruise/bleed easily.  Psychiatric/Behavioral: Negative for confusion.     Physical Exam Updated Vital Signs BP 135/61   Pulse  69   Temp 99.2 F (37.3 C) (Oral)   Resp 20   Ht 5\' 6"  (1.676 m)   Wt 104.3 kg   SpO2 99%   BMI 37.12 kg/m   Physical Exam  Constitutional: He is oriented to person, place, and time. He appears well-developed and well-nourished.  HENT:  Head: Normocephalic and atraumatic.  Right Ear: Tympanic membrane, external ear and ear canal normal.  Mouth/Throat: Oropharynx is clear and moist and mucous membranes are normal.  Eyes: Conjunctivae and EOM are normal. Pupils are equal, round, and reactive to light. No scleral icterus.  Sclera clear  Neck: Neck supple.  Cardiovascular: Normal rate, regular rhythm and normal heart sounds.  Exam reveals no gallop and no friction rub.   No murmur heard. Pulmonary/Chest: Effort normal and breath sounds normal. No respiratory distress. He has no wheezes. He has no rales.  Abdominal: Soft. Bowel sounds are normal. There is no tenderness. A hernia is present.  Umbilicus hernia that is non-tender and easily reducible.  Musculoskeletal: He exhibits edema.  Trace edema to bilateral LE.   Neurological: He is alert and oriented to person, place, and time. No cranial nerve deficit. He exhibits normal muscle tone. Coordination normal.  Skin: Skin is warm and dry.  Psychiatric: He has a normal mood and affect. His behavior is normal.  Nursing note and vitals reviewed.   ED Treatments / Results  DIAGNOSTIC STUDIES: Oxygen Saturation is 98% on RA, nl by my interpretation.    COORDINATION OF CARE: 10:10 PM Discussed treatment plan with pt at bedside which includes labs, zofran, IV fluids, and pt agreed to plan.   Labs (all labs ordered are listed, but only abnormal results are displayed) Labs Reviewed  CBC WITH DIFFERENTIAL/PLATELET - Abnormal; Notable for the following:       Result Value   WBC 13.3 (*)    Neutro Abs 12.0 (*)    Lymphs Abs 0.5 (*)    All other components within normal limits  COMPREHENSIVE METABOLIC PANEL - Abnormal; Notable for the  following:    Glucose, Bld 121 (*)    All other components within normal limits  LIPASE, BLOOD    Procedures Procedures (including critical care time)  Medications Ordered in ED Medications  0.9 %  sodium chloride infusion ( Intravenous New Bag/Given 07/01/16 2348)  sodium chloride 0.9 % bolus 500 mL (0 mLs Intravenous Stopped 07/01/16 2327)  ondansetron (ZOFRAN) injection 4 mg (4 mg Intravenous Given 07/01/16 2231)     Initial Impression / Assessment and Plan / ED Course  I have reviewed the triage vital signs and the nursing notes.  Pertinent labs that were available during my care of the patient were reviewed by me and considered in my medical decision making (see chart for details).  Clinical Course     Patient was sudden onset of nausea vomiting and diarrhea. Controlled here with Zofran. No significant abdominal tenderness. Sounds consistent with a viral gastroenteritis. Doubt that it's food poisoning but possible. We'll still treat symptomatically. Patient nontoxic no acute distress. Labs  without significant abnormalities other than a mild leukocytosis.  Final Clinical Impressions(s) / ED Diagnoses   Final diagnoses:  Gastroenteritis    New Prescriptions New Prescriptions   LOPERAMIDE (IMODIUM A-D) 2 MG TABLET    Take 1 tablet (2 mg total) by mouth 4 (four) times daily as needed for diarrhea or loose stools.   PROMETHAZINE (PHENERGAN) 25 MG TABLET    Take 1 tablet (25 mg total) by mouth every 6 (six) hours as needed.   I personally performed the services described in this documentation, which was scribed in my presence. The recorded information has been reviewed and is accurate.       [name], MD 07/01/16 [phone]

## 2016-07-02 DIAGNOSIS — J189 Pneumonia, unspecified organism: Secondary | ICD-10-CM

## 2016-07-02 HISTORY — DX: Pneumonia, unspecified organism: J18.9

## 2016-09-19 ENCOUNTER — Ambulatory Visit (INDEPENDENT_AMBULATORY_CARE_PROVIDER_SITE_OTHER): Payer: BLUE CROSS/BLUE SHIELD | Admitting: Orthopaedic Surgery

## 2016-09-26 ENCOUNTER — Ambulatory Visit (INDEPENDENT_AMBULATORY_CARE_PROVIDER_SITE_OTHER): Payer: BLUE CROSS/BLUE SHIELD | Admitting: Orthopaedic Surgery

## 2016-09-26 ENCOUNTER — Encounter (INDEPENDENT_AMBULATORY_CARE_PROVIDER_SITE_OTHER): Payer: Self-pay | Admitting: Orthopaedic Surgery

## 2016-09-26 ENCOUNTER — Ambulatory Visit (INDEPENDENT_AMBULATORY_CARE_PROVIDER_SITE_OTHER): Payer: Self-pay

## 2016-09-26 VITALS — Ht 66.0 in | Wt 240.0 lb

## 2016-09-26 DIAGNOSIS — G8929 Other chronic pain: Secondary | ICD-10-CM | POA: Diagnosis not present

## 2016-09-26 DIAGNOSIS — M25561 Pain in right knee: Secondary | ICD-10-CM | POA: Diagnosis not present

## 2016-09-26 MED ORDER — LIDOCAINE HCL 1 % IJ SOLN
5.0000 mL | INTRAMUSCULAR | Status: AC | PRN
  Administered 2016-09-26: 5 mL

## 2016-09-26 MED ORDER — BUPIVACAINE HCL 0.5 % IJ SOLN
3.0000 mL | INTRAMUSCULAR | Status: AC | PRN
  Administered 2016-09-26: 3 mL via INTRA_ARTICULAR

## 2016-09-26 MED ORDER — METHYLPREDNISOLONE ACETATE 40 MG/ML IJ SUSP
80.0000 mg | INTRAMUSCULAR | Status: AC | PRN
  Administered 2016-09-26: 80 mg

## 2016-09-26 NOTE — Progress Notes (Signed)
Office Visit Note   Patient: [name]           Date of Birth: [birth year]           MRN: [id number] Visit Date: 09/26/2016              Requested by: Cameron Squibb, MD [address] [place], [place] [address] PCP: Cameron Neighbors, MD   Assessment & Plan: Visit Diagnoses: End-stage osteoarthritis right knee. Long discussion regarding diagnosis and treatment options including NSAIDs, bracing, cortisone injections and viscose supplementation. He like to try a cortisone injection. Will see him back in 1 month and monitor the response  Follow-Up Instructions: No Follow-up on file.   Orders:  No orders of the defined types were placed in this encounter.  No orders of the defined types were placed in this encounter.     Procedures: Large Joint Inj Date/Time: 09/26/2016 11:32 AM Performed by: [name] Authorized by: [name]   Consent Given by:  Patient Timeout: prior to procedure the correct patient, procedure, and site was verified   Indications:  Pain and joint swelling Location:  Knee Site:  R knee Prep: patient was prepped and draped in usual sterile fashion   Needle Size:  25 G Needle Length:  1.5 inches Approach:  Anteromedial Ultrasound Guidance: No   Fluoroscopic Guidance: No   Arthrogram: No   Medications:  5 mL lidocaine 1 %; 80 mg methylPREDNISolone acetate 40 MG/ML; 3 mL bupivacaine 0.5 % Aspiration Attempted: No   Patient tolerance:  Patient tolerated the procedure well with no immediate complications     Clinical Data: No additional findings.   Subjective: Chief Complaint  Patient presents with  . Right Knee - Pain    [name] is a 61 year old male that presents with Right knee pain that started 2 -3 years ago when he and his wife were walking for exercise. He relates that it has become increasingly worse the last 2-3 months. 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  He states his pain stays mainly in the right knee and radiates to the "bottom" of his feet.  [name] relates that his pain is predominantly along the medial aspect of his knee. He's not had any specific treatment to date.   Review of Systems   Objective: Vital Signs: There were no vitals taken for this visit.  Physical Exam  Ortho Exam right knee with incomplete extension lacking approximately 10. Predominantly medial joint pain diffusely. No pain laterally. Mild patella crepitation. Minimal effusion. No calf pain. Mild pitting edema both ankles distally. Range of motion of right hip.  Specialty Comments:  No specialty comments available.  Imaging: No results found.   PMFS History: Patient Active Problem List   Diagnosis Date Noted  . Colon cancer screening 10/28/2013  . Umbilical hernia without mention of obstruction or gangrene 10/28/2013   Past Medical History:  Diagnosis Date  . Hypertension     Family History  Problem Relation Age of Onset  . Colon cancer Neg Hx   . Colon polyps Neg Hx     Past Surgical History:  Procedure Laterality Date  . EYE SURGERY    . VASECTOMY     Social History   Occupational History  . Not on file.   Social History Main Topics  . Smoking status: Never Smoker  .  Smokeless tobacco: Never Used  . Alcohol use No  . Drug use: No  . Sexual activity: Not on file

## 2016-10-24 ENCOUNTER — Ambulatory Visit (INDEPENDENT_AMBULATORY_CARE_PROVIDER_SITE_OTHER): Payer: BLUE CROSS/BLUE SHIELD | Admitting: Orthopaedic Surgery

## 2016-10-31 ENCOUNTER — Encounter (INDEPENDENT_AMBULATORY_CARE_PROVIDER_SITE_OTHER): Payer: Self-pay | Admitting: Orthopaedic Surgery

## 2016-10-31 ENCOUNTER — Ambulatory Visit (INDEPENDENT_AMBULATORY_CARE_PROVIDER_SITE_OTHER): Payer: BLUE CROSS/BLUE SHIELD | Admitting: Orthopaedic Surgery

## 2016-10-31 VITALS — BP 136/76 | HR 68 | Ht 66.0 in | Wt 262.0 lb

## 2016-10-31 DIAGNOSIS — G8929 Other chronic pain: Secondary | ICD-10-CM

## 2016-10-31 DIAGNOSIS — M25561 Pain in right knee: Secondary | ICD-10-CM

## 2016-10-31 NOTE — Progress Notes (Signed)
Office Visit Note   Patient: [name]           Date of Birth: 12/13/1955           MRN: [id number] Visit Date: 10/31/2016              Requested by: Celene Squibb, MD [address] [place], [place] [address] PCP: Wende Neighbors, MD   Assessment & Plan: Visit Diagnoses:  1. Chronic pain of right knee   End-stage osteoarthritis right knee  Plan: Long discussion over 30 minutes regarding the present status of his knee and treatment options. He would definitely like to consider total knee replacement sometime over the next 3-4 months. In the interim he is going to lose 20-30 pounds and work out" at [employer]. In anticipation of the surgery I will give him a clearance form. I discussed the surgery in detail in terms of incision hospitalization and physical therapy. We've also discussed time out of work in physical therapy Follow-Up Instructions: Return if symptoms worsen or fail to improve.   Orders:  No orders of the defined types were placed in this encounter.  No orders of the defined types were placed in this encounter.     Procedures: No procedures performed   Clinical Data: No additional findings.   Subjective: Chief Complaint  Patient presents with  . Right Knee - Pain    Mr. [name] is a 61 y o that presents with Right knee. Last visit cortisone injection helped a few weeks.  Mr. [name] relates that the cortisone injection in his right knee lasted several weeks or to return. He's tried many over-the-counter medicines which only provided some temporary relief of pain. His job requires him to be on his feet over the course of the day and he finds it is becoming more and more difficult. Pain is localized to his right knee without thigh or groin discomfort. No back pain.  HPI  Review of Systems   Objective: Vital Signs: BP 136/76   Pulse 68   Ht 5\' 6"  (1.676 m)   Wt 262 lb (118.8 kg)   BMI 42.29 kg/m   Physical Exam  Ortho Exam right knee without  effusion. Palpable osteophytes medially. Increased varus. No swelling distally. Neurovascular exam intact.  No instability. Lacks 5 or 6 to full extension and flexed approximately 104. No thigh pain. Straight leg raise negative.  Imaging: No results found.   PMFS History: Patient Active Problem List   Diagnosis Date Noted  . Colon cancer screening 10/28/2013  . Umbilical hernia without mention of obstruction or gangrene 10/28/2013   Past Medical History:  Diagnosis Date  . Hypertension     Family History  Problem Relation Age of Onset  . Colon cancer Neg Hx   . Colon polyps Neg Hx     Past Surgical History:  Procedure Laterality Date  . EYE SURGERY    . VASECTOMY     Social History   Occupational History  . Not on file.   Social History Main Topics  . Smoking status: Never Smoker  . Smokeless tobacco: Never Used  . Alcohol use No  . Drug use: No  . Sexual activity: Not on file     [name], MD   Note - This record has been created using [employer].  Chart creation errors have been sought, but may not always  have been located. Such creation errors do not reflect on  the standard  of medical care.

## 2016-12-26 ENCOUNTER — Other Ambulatory Visit (INDEPENDENT_AMBULATORY_CARE_PROVIDER_SITE_OTHER): Payer: Self-pay

## 2016-12-26 MED ORDER — METHOCARBAMOL 500 MG PO TABS: 500.0000 mg | ORAL_TABLET | Freq: Two times a day (BID) | ORAL | 0 refills | Status: DC | PRN

## 2017-03-17 ENCOUNTER — Emergency Department (HOSPITAL_COMMUNITY)
Admission: EM | Admit: 2017-03-17 | Discharge: 2017-03-18 | Disposition: A | Discharge status: Home / Self Care | Payer: BLUE CROSS/BLUE SHIELD | Attending: Emergency Medicine | Admitting: Emergency Medicine

## 2017-03-17 ENCOUNTER — Emergency Department (HOSPITAL_COMMUNITY): Payer: BLUE CROSS/BLUE SHIELD

## 2017-03-17 ENCOUNTER — Encounter (HOSPITAL_COMMUNITY): Payer: Self-pay | Admitting: Emergency Medicine

## 2017-03-17 DIAGNOSIS — Z7982 Long term (current) use of aspirin: Secondary | ICD-10-CM | POA: Diagnosis not present

## 2017-03-17 DIAGNOSIS — Z79899 Other long term (current) drug therapy: Secondary | ICD-10-CM | POA: Diagnosis not present

## 2017-03-17 DIAGNOSIS — I10 Essential (primary) hypertension: Secondary | ICD-10-CM | POA: Insufficient documentation

## 2017-03-17 DIAGNOSIS — R509 Fever, unspecified: Secondary | ICD-10-CM | POA: Diagnosis present

## 2017-03-17 DIAGNOSIS — J181 Lobar pneumonia, unspecified organism: Secondary | ICD-10-CM | POA: Insufficient documentation

## 2017-03-17 DIAGNOSIS — J189 Pneumonia, unspecified organism: Secondary | ICD-10-CM

## 2017-03-17 IMAGING — DX DG CHEST 2V
2 series · 2 of 2 positions shown · non-contrast
Comparison: [DATE]

CLINICAL DATA: Fever, chills, and body aches since [REDACTED] after a
flu shot.

EXAM:
CHEST  2 VIEW

[chest pa]
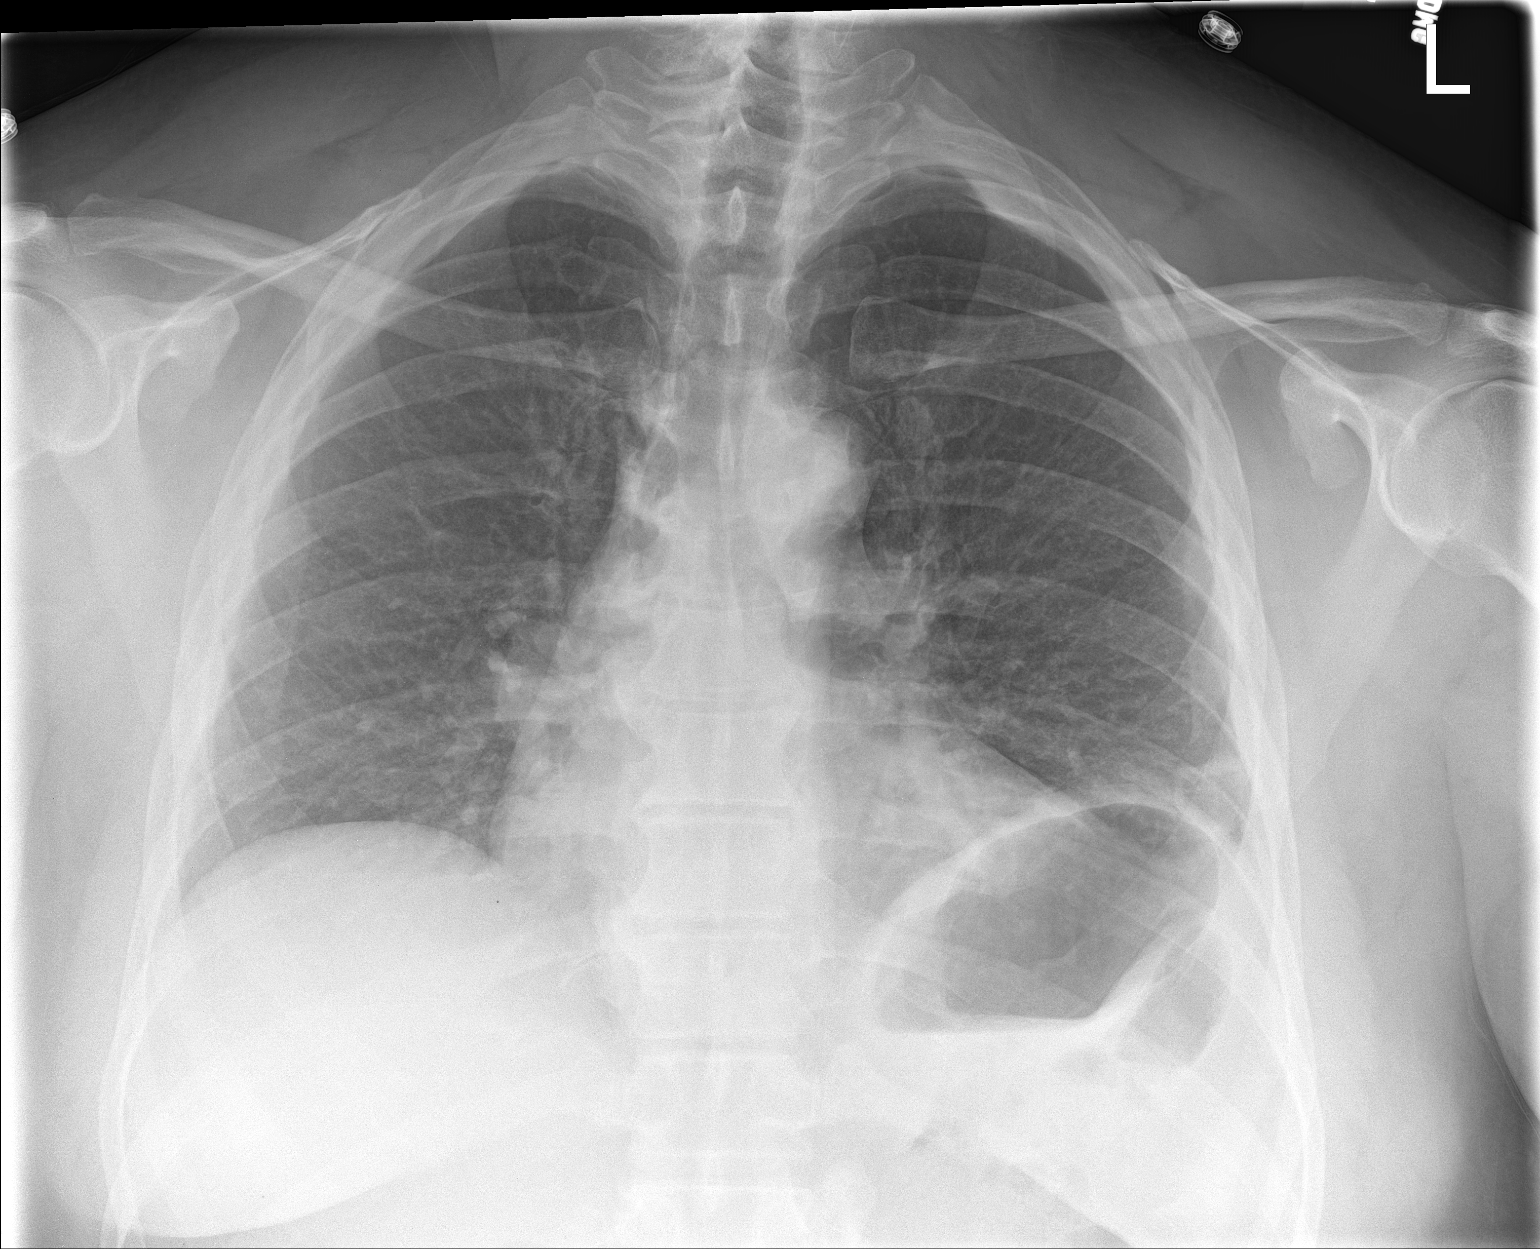

[chest lat]
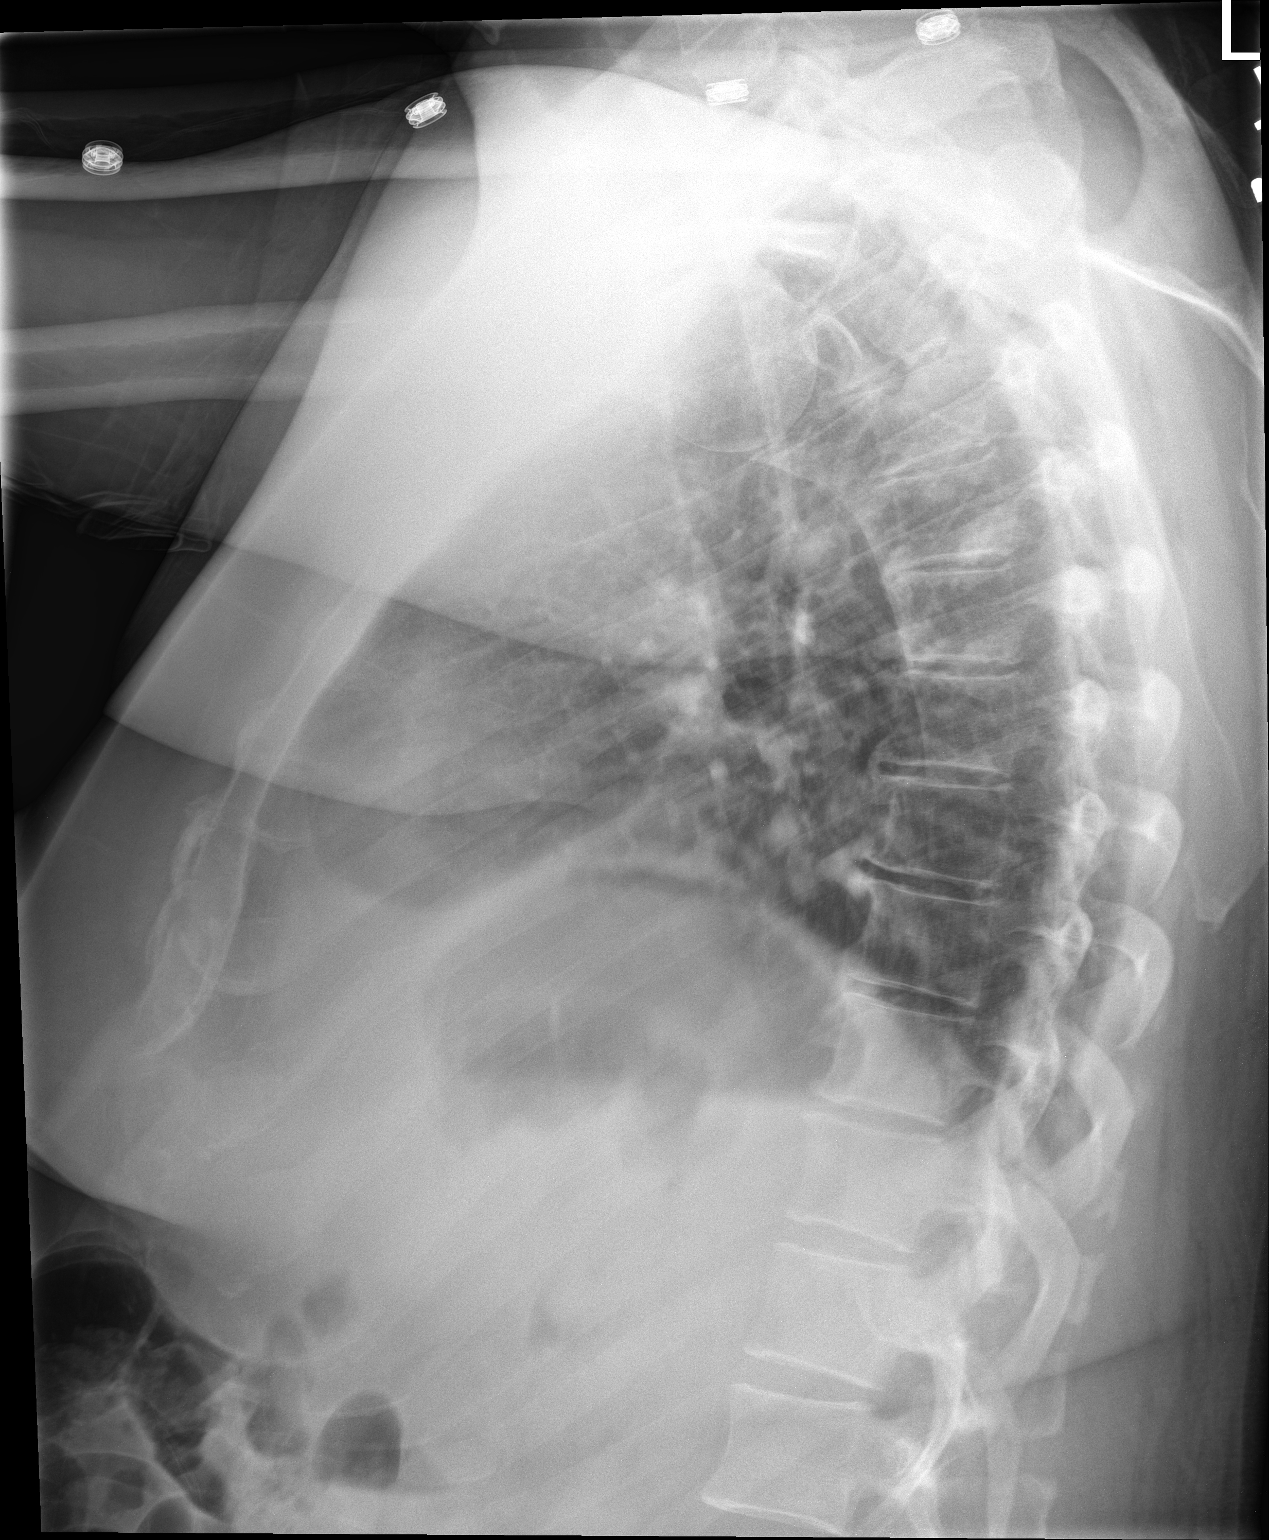

[2 of 2 positions shown; findings below may reference images not displayed]

FINDINGS: Shallow inspiration with elevation of the left hemidiaphragm. There
is infiltration or atelectasis in the left lung base. This could
indicate focal pneumonia. Right lung is clear. Heart size and
pulmonary vascularity are normal. No blunting of costophrenic
angles. No pneumothorax. Mediastinal contours appear intact.
Degenerative changes in the spine.
IMPRESSION: Shallow inspiration with elevation of the left hemidiaphragm.
Infiltration or atelectasis in the left lung base could indicate
pneumonia.

## 2017-03-17 MED ORDER — ONDANSETRON HCL 4 MG PO TABS
4.0000 mg | ORAL_TABLET | Freq: Once | ORAL | Status: AC
  Administered 2017-03-17: 4 mg via ORAL
  Filled 2017-03-17: qty 1

## 2017-03-17 MED ORDER — ACETAMINOPHEN 325 MG PO TABS
650.0000 mg | ORAL_TABLET | Freq: Once | ORAL | Status: AC
  Administered 2017-03-17: 650 mg via ORAL
  Filled 2017-03-17: qty 2

## 2017-03-17 MED ORDER — IBUPROFEN 800 MG PO TABS
800.0000 mg | ORAL_TABLET | Freq: Once | ORAL | Status: AC
  Administered 2017-03-17: 800 mg via ORAL
  Filled 2017-03-17: qty 1

## 2017-03-17 MED ORDER — HYDROCODONE-ACETAMINOPHEN 5-325 MG PO TABS
1.0000 | ORAL_TABLET | Freq: Once | ORAL | Status: AC
  Administered 2017-03-17: 1 via ORAL
  Filled 2017-03-17: qty 1

## 2017-03-17 NOTE — ED Triage Notes (Signed)
Fever ,chills body aches since Tuesday after he had a flu shot

## 2017-03-17 NOTE — ED Notes (Signed)
Checked with patient for urine sample,patient tried but had no luck,will try back in 30 minutes.

## 2017-03-18 LAB — URINALYSIS, ROUTINE W REFLEX MICROSCOPIC
BACTERIA UA: NONE SEEN
Bilirubin Urine: NEGATIVE
Glucose, UA: NEGATIVE mg/dL
Hgb urine dipstick: NEGATIVE
Ketones, ur: NEGATIVE mg/dL
NITRITE: NEGATIVE
PROTEIN: 30 mg/dL — AB
Specific Gravity, Urine: 1.02 (ref 1.005–1.030)
pH: 5 (ref 5.0–8.0)

## 2017-03-18 LAB — CBC WITH DIFFERENTIAL/PLATELET
Basophils Absolute: 0 10*3/uL (ref 0.0–0.1)
Basophils Relative: 0 %
EOS ABS: 0 10*3/uL (ref 0.0–0.7)
EOS PCT: 0 %
HCT: 39.1 % (ref 39.0–52.0)
HEMOGLOBIN: 12.7 g/dL — AB (ref 13.0–17.0)
LYMPHS ABS: 1.4 10*3/uL (ref 0.7–4.0)
Lymphocytes Relative: 7 %
MCH: 30.5 pg (ref 26.0–34.0)
MCHC: 32.5 g/dL (ref 30.0–36.0)
MCV: 93.8 fL (ref 78.0–100.0)
Monocytes Absolute: 2.1 10*3/uL — ABNORMAL HIGH (ref 0.1–1.0)
Monocytes Relative: 11 %
Neutro Abs: 16.5 10*3/uL — ABNORMAL HIGH (ref 1.7–7.7)
Neutrophils Relative %: 82 %
PLATELETS: 224 10*3/uL (ref 150–400)
RBC: 4.17 MIL/uL — ABNORMAL LOW (ref 4.22–5.81)
RDW: 12.9 % (ref 11.5–15.5)
WBC: 20 10*3/uL — ABNORMAL HIGH (ref 4.0–10.5)

## 2017-03-18 LAB — COMPREHENSIVE METABOLIC PANEL
ALT: 24 U/L (ref 17–63)
AST: 20 U/L (ref 15–41)
Albumin: 3.6 g/dL (ref 3.5–5.0)
Alkaline Phosphatase: 52 U/L (ref 38–126)
Anion gap: 10 (ref 5–15)
BUN: 15 mg/dL (ref 6–20)
CHLORIDE: 97 mmol/L — AB (ref 101–111)
CO2: 24 mmol/L (ref 22–32)
CREATININE: 1.02 mg/dL (ref 0.61–1.24)
Calcium: 8.8 mg/dL — ABNORMAL LOW (ref 8.9–10.3)
GFR calc Af Amer: >60 mL/min (ref >60)
GFR calc non Af Amer: >60 mL/min (ref >60)
Glucose, Bld: 156 mg/dL — ABNORMAL HIGH (ref 65–99)
Potassium: 3.4 mmol/L — ABNORMAL LOW (ref 3.5–5.1)
SODIUM: 131 mmol/L — AB (ref 135–145)
Total Bilirubin: 0.8 mg/dL (ref 0.3–1.2)
Total Protein: 7.6 g/dL (ref 6.5–8.1)

## 2017-03-18 LAB — INFLUENZA PANEL BY PCR (TYPE A & B)
INFLAPCR: NEGATIVE
Influenza B By PCR: NEGATIVE

## 2017-03-18 LAB — LACTIC ACID, PLASMA: Lactic Acid, Venous: 1.5 mmol/L (ref 0.5–1.9)

## 2017-03-18 MED ORDER — LEVOFLOXACIN 500 MG PO TABS: 500.0000 mg | ORAL_TABLET | Freq: Every day | ORAL | 0 refills | Status: DC

## 2017-03-18 MED ORDER — HYDROCODONE-HOMATROPINE 5-1.5 MG/5ML PO SYRP: 5.0000 mL | ORAL_SOLUTION | Freq: Four times a day (QID) | ORAL | 0 refills | Status: DC | PRN

## 2017-03-18 MED ORDER — DEXTROSE 5 % IV SOLN
1.0000 g | Freq: Once | INTRAVENOUS | Status: AC
  Administered 2017-03-18: 1 g via INTRAVENOUS
  Filled 2017-03-18: qty 10

## 2017-03-18 MED ORDER — AZITHROMYCIN 250 MG PO TABS
500.0000 mg | ORAL_TABLET | Freq: Once | ORAL | Status: AC
  Administered 2017-03-18: 500 mg via ORAL
  Filled 2017-03-18: qty 2

## 2017-03-18 NOTE — Discharge Instructions (Signed)
Your chest x-ray suggest a left lung pneumonia. Your influenza test is negative. Your temperature upon arrival to the emergency department was 103.1. Please increase fluids. Please wash hands frequently. Please schedule appointment with Dr. Nevada Crane as sone as possible for follow-up of your pneumonia. Use Levaquin 1 daily. Use Tylenol every 4 hours, or ibuprofen every 6 hours for fever and aching. Use Hycodan for cough.This medication may cause drowsiness. Please do not drink, drive, or participate in activity that requires concentration while taking this medication.

## 2017-03-18 NOTE — ED Provider Notes (Signed)
[facility] DEPT Provider Note   CSN: [id number] Arrival date & time: 03/17/17  2302     History   Chief Complaint Chief Complaint  Patient presents with  . Fever    HPI [name] is a 61 y.o. male.  Patient is a 61 year old male who presents to the emergency department with a complaint of fever and aching.  The patient states that he has not been feeling well for the past 3 or 4 days. He states that this problem is been getting progressively worse on last night he started having severe aching and chills. He said he felt hot but he did not check in temperature. He gets some mild dizziness and this was accompanied by headache. He denies on vomiting or diarrhea. He denies any unusual rash. He's not had any tick bites recently. He has not been around anyone his been ill that he is aware of. He denies abdominal pain, dysuria and he has not been out of the country recently. He denies smoking. He denies use of any recreational drugs. He presents to the emergency department for assistance with the symptoms.  It is of note that the patient had a flu shot recently. He states that he had some similar symptoms last year when he had a flu shot, and he is questioning if he may be having an adverse reaction to the flu vaccine.   The history is provided by the patient.  Fever   Associated symptoms include congestion and headaches. Pertinent negatives include no chest pain and no cough.    Past Medical History:  Diagnosis Date  . Hypertension     Patient Active Problem List   Diagnosis Date Noted  . Colon cancer screening 10/28/2013  . Umbilical hernia without mention of obstruction or gangrene 10/28/2013    Past Surgical History:  Procedure Laterality Date  . EYE SURGERY    . VASECTOMY         Home Medications    Prior to Admission medications   Medication Sig Start Date End Date Taking? Authorizing Provider  aspirin EC 325 MG tablet Take 325 mg by mouth daily.     [provider]  ibuprofen (ADVIL,MOTRIN) 400 MG tablet Take 1 tablet (400 mg total) by mouth every 6 (six) hours as needed. 08/31/15   Mesner, Corene Cornea, MD  loperamide (IMODIUM A-D) 2 MG tablet Take 1 tablet (2 mg total) by mouth 4 (four) times daily as needed for diarrhea or loose stools. 07/01/16   [name], MD  methocarbamol (ROBAXIN) 500 MG tablet Take 1 tablet (500 mg total) by mouth 2 (two) times daily as needed for muscle spasms. 12/26/16   [name], MD  Multiple Vitamin (MULTIVITAMIN WITH MINERALS) TABS tablet Take 1 tablet by mouth daily.    [provider]  promethazine (PHENERGAN) 25 MG tablet Take 1 tablet (25 mg total) by mouth every 6 (six) hours as needed. Patient not taking: Reported on 10/31/2016 07/01/16   [name], MD    Family History Family History  Problem Relation Age of Onset  . Colon cancer Neg Hx   . Colon polyps Neg Hx     Social History Social History  Substance Use Topics  . Smoking status: Never Smoker  . Smokeless tobacco: Never Used  . Alcohol use No     Allergies   Flexeril [cyclobenzaprine] and Robaxin [methocarbamol]   Review of Systems Review of Systems  Constitutional: Positive for appetite change, chills, fatigue and fever. Negative  for activity change.       All ROS Neg except as noted in HPI  HENT: Positive for congestion. Negative for nosebleeds.   Eyes: Negative for photophobia and discharge.  Respiratory: Negative for cough, shortness of breath and wheezing.   Cardiovascular: Negative for chest pain and palpitations.  Gastrointestinal: Negative for abdominal pain and blood in stool.  Genitourinary: Negative for dysuria, frequency and hematuria.  Musculoskeletal: Positive for myalgias. Negative for arthralgias, back pain and neck pain.  Skin: Negative.   Neurological: Positive for headaches. Negative for dizziness, seizures and speech difficulty.  Psychiatric/Behavioral: Negative for confusion and  hallucinations.     Physical Exam Updated Vital Signs BP (!) 126/52 (BP Location: Right Arm)   Pulse 75   Temp (!) 103.1 F (39.5 C) (Oral)   Resp 19   Ht 5\' 6"  (1.676 m)   Wt 118.8 kg (262 lb)   SpO2 94%   BMI 42.29 kg/m   Physical Exam  Constitutional: He is oriented to person, place, and time. He appears well-developed and well-nourished.  Non-toxic appearance.  HENT:  Head: Normocephalic.  Right Ear: Tympanic membrane and external ear normal.  Left Ear: Tympanic membrane and external ear normal.  Nasal congestion present.  Eyes: Pupils are equal, round, and reactive to light. EOM and lids are normal.  Neck: Normal range of motion. Neck supple. Carotid bruit is not present.  Cardiovascular: Normal rate, intact distal pulses and normal pulses.  Exam reveals no gallop and no friction rub.   Occasional extrasystoles. No rub or gallop appreciated.  Pulmonary/Chest: No respiratory distress. He has no wheezes.  Shallow respirations noted. Question decreased breath sounds at the bases.  Abdominal: Soft. Bowel sounds are normal. There is no tenderness. There is no guarding.  Musculoskeletal: Normal range of motion.  Lymphadenopathy:       Head (right side): No submandibular adenopathy present.       Head (left side): No submandibular adenopathy present.    He has no cervical adenopathy.  Neurological: He is alert and oriented to person, place, and time. He has normal strength. No cranial nerve deficit or sensory deficit.  Skin: Skin is warm and dry. No rash noted.  Psychiatric: He has a normal mood and affect. His speech is normal.  Nursing note and vitals reviewed.    ED Treatments / Results  Labs (all labs ordered are listed, but only abnormal results are displayed) Labs Reviewed  COMPREHENSIVE METABOLIC PANEL - Abnormal; Notable for the following:       Result Value   Sodium 131 (*)    Potassium 3.4 (*)    Chloride 97 (*)    Glucose, Bld 156 (*)    Calcium 8.8 (*)      All other components within normal limits  CBC WITH DIFFERENTIAL/PLATELET - Abnormal; Notable for the following:    WBC 20.0 (*)    RBC 4.17 (*)    Hemoglobin 12.7 (*)    Neutro Abs 16.5 (*)    Monocytes Absolute 2.1 (*)    All other components within normal limits  LACTIC ACID, PLASMA  URINALYSIS, ROUTINE W REFLEX MICROSCOPIC  INFLUENZA PANEL BY PCR (TYPE A & B)    EKG  EKG Interpretation None       Radiology Dg Chest 2 View  Result Date: 03/18/2017 CLINICAL DATA:  Fever, chills, and body aches since Tuesday after a flu shot. EXAM: CHEST  2 VIEW COMPARISON:  09/13/2003 FINDINGS: Shallow inspiration with elevation of the  left hemidiaphragm. There is infiltration or atelectasis in the left lung base. This could indicate focal pneumonia. Right lung is clear. Heart size and pulmonary vascularity are normal. No blunting of costophrenic angles. No pneumothorax. Mediastinal contours appear intact. Degenerative changes in the spine. IMPRESSION: Shallow inspiration with elevation of the left hemidiaphragm. Infiltration or atelectasis in the left lung base could indicate pneumonia. Electronically Signed   By: Lucienne Capers M.D.   On: 03/18/2017 00:10    Procedures Procedures (including critical care time)  Medications Ordered in ED Medications  cefTRIAXone (ROCEPHIN) 1 g in dextrose 5 % 50 mL IVPB (not administered)  acetaminophen (TYLENOL) tablet 650 mg (650 mg Oral Given 03/17/17 2320)  ibuprofen (ADVIL,MOTRIN) tablet 800 mg (800 mg Oral Given 03/17/17 2320)  HYDROcodone-acetaminophen (NORCO/VICODIN) 5-325 MG per tablet 1 tablet (1 tablet Oral Given 03/17/17 2342)  ondansetron (ZOFRAN) tablet 4 mg (4 mg Oral Given 03/17/17 2342)  azithromycin (ZITHROMAX) tablet 500 mg (500 mg Oral Given 03/18/17 0036)     Initial Impression / Assessment and Plan / ED Course  I have reviewed the triage vital signs and the nursing notes.  Pertinent labs & imaging results that were available during  my care of the patient were reviewed by me and considered in my medical decision making (see chart for details).       Final Clinical Impressions(s) / ED Diagnoses MDM Temperature is elevated at 103.1. Pulse oximetry is 94% on room air. Pt treated with tylenol and ibuprofen norco 5mg  given for aching.  Complete blood count shows the white blood cells to be elevated at 20,000. There is no noted shift to the left. The competence of metabolic panel shows the sodium to be 131, the potassium to be 3.4 slightly low. Glucose is slightly elevated at 156. The remainder of the competence of metabolic panel is within normal limits. Lactic acid is normal at 1.5. Chest x-ray shows shallow inspiration with elevation of the left hemidiaphragm. There is infiltrate in the left lung base that questions on pneumonia.  IV Rocephin and oral Zithromax started on this patient. Influenza test negative.  Patient discussed with Dr. Dina Rich.  Temp down to 99.6 after tylenol and ibuprofen. Blood pressure 660 systolic. Pt ambulated around the hall. Pulse ox remained at 98% on room air during and after ambulation.  CURB-65 SCORE 0, Will manage pt as outpatient with close follow up by Dr Nevada Crane. Rx for Levaquin and Hycodan given to the patient. Pt to increase fluids and wash hands frequently. Pt and family acknowledge understanding of instructions.   Final diagnoses:  Community acquired pneumonia of left lower lobe of lung ([place])    New Prescriptions New Prescriptions   HYDROCODONE-HOMATROPINE (HYCODAN) 5-1.5 MG/5ML SYRUP    Take 5 mLs by mouth every 6 (six) hours as needed.   LEVOFLOXACIN (LEVAQUIN) 500 MG TABLET    Take 1 tablet (500 mg total) by mouth daily.     Lily Kocher, PA-C 03/18/17 0109    Merryl Hacker, MD 03/18/17 [phone]

## 2017-03-18 NOTE — ED Notes (Signed)
Pulse oximetry on room air prior to ambulation was 98% and remained at 98% during and after ambulation.

## 2017-05-02 ENCOUNTER — Telehealth (INDEPENDENT_AMBULATORY_CARE_PROVIDER_SITE_OTHER): Payer: Self-pay | Admitting: Radiology

## 2017-05-02 NOTE — Telephone Encounter (Signed)
Patient called [place] [facility] office and states he is waiting on call to get surgery scheduled and has not heard anything. He requests a call back ASAP. He states that he has left previous messages but has not received a return call. [phone]  Can you please check on this for me?  I was not sure who the message should go to. Dr. Durward Fortes patient. Thanks.

## 2017-05-07 NOTE — Telephone Encounter (Signed)
I spoke with [name] and his wife.  Advised that we have not received medical clearance from his PCP as now.  His wife is going to work on getting the clearance faxed to [place] from Dr. Nevada Crane and then we will proceed with scheduling.  Patient would like to have surgery some time in January.

## 2017-07-16 ENCOUNTER — Telehealth (INDEPENDENT_AMBULATORY_CARE_PROVIDER_SITE_OTHER): Payer: Self-pay

## 2017-07-16 NOTE — Telephone Encounter (Signed)
Spoke with Baldo Ash at [facility] office to get Xrays on disk for pt for upcoming surgery. 07/30/17  R TKA. Will pick up on Wednesday at [facility] office

## 2017-07-17 ENCOUNTER — Ambulatory Visit (INDEPENDENT_AMBULATORY_CARE_PROVIDER_SITE_OTHER): Payer: BLUE CROSS/BLUE SHIELD | Admitting: Orthopedic Surgery

## 2017-07-17 ENCOUNTER — Ambulatory Visit (INDEPENDENT_AMBULATORY_CARE_PROVIDER_SITE_OTHER): Payer: Self-pay

## 2017-07-17 ENCOUNTER — Encounter (INDEPENDENT_AMBULATORY_CARE_PROVIDER_SITE_OTHER): Payer: Self-pay | Admitting: Orthopedic Surgery

## 2017-07-17 VITALS — BP 136/57 | HR 60 | Temp 97.8°F | Resp 16 | Ht 68.5 in | Wt 260.0 lb

## 2017-07-17 DIAGNOSIS — M25561 Pain in right knee: Secondary | ICD-10-CM | POA: Diagnosis not present

## 2017-07-17 DIAGNOSIS — G8929 Other chronic pain: Secondary | ICD-10-CM | POA: Diagnosis not present

## 2017-07-17 NOTE — H&P (Addendum)
TOTAL KNEE ADMISSION H&P  Patient is being admitted for right total knee arthroplasty.  Subjective:  Chief Complaint:right knee pain.  HPI: [name], 62 y.o. male, has a history of pain and functional disability in the right knee due to arthritis and has failed non-surgical conservative treatments for greater than 12 weeks to includeNSAID's and/or analgesics, corticosteriod injections, flexibility and strengthening excercises, weight reduction as appropriate and activity modification.  Onset of symptoms was gradual, starting 5 years ago with gradually worsening course since that time. The patient noted no past surgery on the right knee(s).  Patient currently rates pain in the right knee(s) at 6 out of 10 with activity. Patient has night pain, worsening of pain with activity and weight bearing, pain that interferes with activities of daily living, crepitus and joint swelling.  Patient has evidence of subchondral sclerosis, periarticular osteophytes, joint subluxation and joint space narrowing by imaging studies.   Patient Active Problem List   Diagnosis Date Noted  . Colon cancer screening 10/28/2013  . Umbilical hernia without mention of obstruction or gangrene 10/28/2013   Past Medical History:  Diagnosis Date  . Detached retina, left   . Hypertension   . Hypertension   . Pneumonia   . Wears glasses     Past Surgical History:  Procedure Laterality Date  . EYE SURGERY    . VASECTOMY      No current facility-administered medications for this encounter.    Current Outpatient Medications  Medication Sig Dispense Refill Last Dose  . acetaminophen (TYLENOL) 500 MG tablet Take 500 mg by mouth every 6 (six) hours as needed for moderate pain or headache.     Marland Kitchen aspirin EC 81 MG tablet Take 81 mg by mouth daily.    Taking  . ibuprofen (ADVIL,MOTRIN) 200 MG tablet Take 200 mg by mouth every 6 (six) hours as needed for headache or moderate pain.     Marland Kitchen losartan-hydrochlorothiazide  (HYZAAR) 50-12.5 MG tablet Take 1 tablet by mouth daily.   Taking  . HYDROcodone-homatropine (HYCODAN) 5-1.5 MG/5ML syrup Take 5 mLs by mouth every 6 (six) hours as needed. (Patient not taking: Reported on 07/17/2017) 120 mL 0 Completed Course at Unknown time  . loperamide (IMODIUM A-D) 2 MG tablet Take 1 tablet (2 mg total) by mouth 4 (four) times daily as needed for diarrhea or loose stools. (Patient not taking: Reported on 07/17/2017) 30 tablet 0 Not Taking at Unknown time  . methocarbamol (ROBAXIN) 500 MG tablet Take 1 tablet (500 mg total) by mouth 2 (two) times daily as needed for muscle spasms. (Patient not taking: Reported on 07/17/2017) 20 tablet 0 Completed Course at Unknown time  . promethazine (PHENERGAN) 25 MG tablet Take 1 tablet (25 mg total) by mouth every 6 (six) hours as needed. (Patient not taking: Reported on 10/31/2016) 12 tablet 1 Completed Course at Unknown time   Allergies  Allergen Reactions  . Flexeril [Cyclobenzaprine] Other (See Comments)    Can not take any muscle relaxer causes respirator depression   . Robaxin [Methocarbamol] Other (See Comments)    Can not take any muscle relaxer causes respirator depression    Social History   Tobacco Use  . Smoking status: Never Smoker  . Smokeless tobacco: Never Used  Substance Use Topics  . Alcohol use: No    Family History  Problem Relation Age of Onset  . Colon cancer Neg Hx   . Colon polyps Neg Hx      ROS  Review of  Systems  Constitutional: Positive for activity change and fatigue.  HENT: Negative for trouble swallowing.   Eyes: Negative for pain.  Respiratory: Negative for shortness of breath.   Cardiovascular: Negative for leg swelling.  Gastrointestinal: Negative for constipation.  Endocrine: Positive for heat intolerance.  Genitourinary: Negative for difficulty urinating.  Musculoskeletal: Positive for gait problem. Negative for joint swelling.  Skin: Negative for rash.  Allergic/Immunologic: Negative for  food allergies.  Neurological: Positive for weakness.  Hematological: Does not bruise/bleed easily.  Psychiatric/Behavioral: Positive for sleep disturbance.    Objective:  Physical Exam  Constitutional: He is oriented to person, place, and time. He appears well-developed and well-nourished.  HENT:  Head: Normocephalic and atraumatic.  Eyes: Conjunctivae and EOM are normal. Pupils are equal, round, and reactive to light.  Neck: Neck supple. No thyromegaly present.  Cardiovascular: Normal rate, regular rhythm, normal heart sounds and intact distal pulses.  No murmur heard. Respiratory: Effort normal and breath sounds normal. No respiratory distress. He has no wheezes.  GI: Soft. Bowel sounds are normal. There is no tenderness.  Neurological: He is alert and oriented to person, place, and time.  Skin: Skin is warm and dry.  Psychiatric: He has a normal mood and affect. His behavior is normal. Judgment and thought content normal.  Musculoskeletal he has marked varus deformity of the right knee. I'm able to correct him to neutral. Crepitance with range of motion. Calf is supple and nontender.  Vital signs in last 24 hours: Temp:  [98.1 F (36.7 C)] 98.1 F (36.7 C) (01/22 1043) Pulse Rate:  [65] 65 (01/22 1043) Resp:  [18] 18 (01/22 1043) BP: (126)/(54) 126/54 (01/22 1043) SpO2:  [98 %] 98 % (01/22 1043) Weight:  [256 lb 3.2 oz (116.2 kg)] 256 lb 3.2 oz (116.2 kg) (01/22 1043)  Labs:   Estimated body mass index is 38.96 kg/m as calculated from the following:   Height as of 07/23/17: 5\' 8"  (1.727 m).   Weight as of 07/23/17: 256 lb 3.2 oz (116.2 kg).   Imaging Review Plain radiographs demonstrate severe degenerative joint disease of the right knee(s). The overall alignment issignificant varus. The bone quality appears to be good for age and reported activity level. He has marked degenerative changes in the more medial than lateral compartments. Apparently there appears to be some  wearing away of the medial tibial plateau. Para-articular spurring noted medially more than lateral.   Assessment/Plan:  End stage arthritis, right knee   The patient history, physical examination, clinical judgment of the provider and imaging studies are consistent with end stage degenerative joint disease of the right knee(s) and total knee arthroplasty is deemed medically necessary. The treatment options including medical management, injection therapy arthroscopy and arthroplasty were discussed at length. The risks and benefits of total knee arthroplasty were presented and reviewed. The risks due to aseptic loosening, infection, stiffness, patella tracking problems, thromboembolic complications and other imponderables were discussed. The patient acknowledged the explanation, agreed to proceed with the plan and consent was signed. Patient is being admitted for inpatient treatment for surgery, pain control, PT, OT, prophylactic antibiotics, VTE prophylaxis, progressive ambulation and ADL's and discharge planning. The patient is planning to be discharged home with home health services   Mike Craze. [place], [place] [phone]  07/23/2017 4:36 PM

## 2017-07-17 NOTE — Progress Notes (Deleted)
   Office Visit Note   Patient: [name]           Date of Birth: Dec 24, 1955           MRN: [id number] Visit Date: 07/17/2017              Requested by: Celene Squibb, MD [address] [place], [place] [address] PCP: Celene Squibb, MD   Assessment & Plan: Visit Diagnoses: No diagnosis found.  Plan: ***  Follow-Up Instructions: No Follow-up on file.   Orders:  No orders of the defined types were placed in this encounter.  No orders of the defined types were placed in this encounter.     Procedures: No procedures performed   Clinical Data: No additional findings.   Subjective: Chief Complaint  Patient presents with  . Right Knee - Pre-op Exam  . Pre-op Exam    TKR right, 07/30/17    HPI  Review of Systems  Constitutional: Positive for activity change and fatigue.  HENT: Negative for trouble swallowing.   Eyes: Negative for pain.  Respiratory: Negative for shortness of breath.   Cardiovascular: Negative for leg swelling.  Gastrointestinal: Negative for constipation.  Endocrine: Positive for heat intolerance.  Genitourinary: Negative for difficulty urinating.  Musculoskeletal: Positive for gait problem. Negative for joint swelling.  Skin: Negative for rash.  Allergic/Immunologic: Negative for food allergies.  Neurological: Positive for weakness.  Hematological: Does not bruise/bleed easily.  Psychiatric/Behavioral: Positive for sleep disturbance.     Objective: Vital Signs: BP (!) 136/57 (BP Location: Right Arm, Patient Position: Sitting, Cuff Size: Normal)   Pulse 60   Temp 97.8 F (36.6 C)   Resp 16   Ht 5' 8.5" (1.74 m)   Wt 260 lb (117.9 kg)   BMI 38.96 kg/m   Physical Exam  Ortho Exam  Specialty Comments:  No specialty comments available.  Imaging: No results found.   PMFS History: Patient Active Problem List   Diagnosis Date Noted  . Colon cancer screening 10/28/2013  . Umbilical hernia without mention of obstruction or  gangrene 10/28/2013   Past Medical History:  Diagnosis Date  . Hypertension   . Hypertension     Family History  Problem Relation Age of Onset  . Colon cancer Neg Hx   . Colon polyps Neg Hx     Past Surgical History:  Procedure Laterality Date  . EYE SURGERY    . VASECTOMY     Social History   Occupational History  . Not on file  Tobacco Use  . Smoking status: Never Smoker  . Smokeless tobacco: Never Used  Substance and Sexual Activity  . Alcohol use: No  . Drug use: No  . Sexual activity: Not on file

## 2017-07-17 NOTE — Progress Notes (Signed)
Chief Complaint:right knee pain.  HPI: [name], 62 y.o. male, has a history of pain and functional disability in the right knee due to arthritis and has failed non-surgical conservative treatments for greater than 12 weeks to includeNSAID's and/or analgesics, corticosteriod injections, flexibility and strengthening excercises, weight reduction as appropriate and activity modification.  Onset of symptoms was gradual, starting 5 years ago with gradually worsening course since that time. The patient noted no past surgery on the right knee(s).  Patient currently rates pain in the right knee(s) at 6 out of 10 with activity. Patient has night pain, worsening of pain with activity and weight bearing, pain that interferes with activities of daily living, crepitus and joint swelling.  Patient has evidence of subchondral sclerosis, periarticular osteophytes, joint subluxation and joint space narrowing by imaging studies.   Patient Active Problem List   Diagnosis Date Noted  . Colon cancer screening 10/28/2013  . Umbilical hernia without mention of obstruction or gangrene 10/28/2013   Past Medical History:  Diagnosis Date  . Hypertension   . Hypertension     Past Surgical History:  Procedure Laterality Date  . EYE SURGERY    . VASECTOMY      No current facility-administered medications for this encounter.    Current Outpatient Medications  Medication Sig Dispense Refill Last Dose  . aspirin EC 325 MG tablet Take 81 mg by mouth daily.    Taking  . HYDROcodone-homatropine (HYCODAN) 5-1.5 MG/5ML syrup Take 5 mLs by mouth every 6 (six) hours as needed. 120 mL 0 Taking  . ibuprofen (ADVIL,MOTRIN) 400 MG tablet Take 1 tablet (400 mg total) by mouth every 6 (six) hours as needed. (Patient not taking: Reported on 07/17/2017) 30 tablet 0 Not Taking  . levofloxacin (LEVAQUIN) 500 MG tablet Take 1 tablet (500 mg total) by mouth daily. (Patient not taking: Reported on 07/17/2017) 7 tablet 0 Not Taking  .  loperamide (IMODIUM A-D) 2 MG tablet Take 1 tablet (2 mg total) by mouth 4 (four) times daily as needed for diarrhea or loose stools. (Patient not taking: Reported on 07/17/2017) 30 tablet 0 Not Taking  . losartan-hydrochlorothiazide (HYZAAR) 50-12.5 MG tablet Take 1 tablet by mouth daily.   Taking  . methocarbamol (ROBAXIN) 500 MG tablet Take 1 tablet (500 mg total) by mouth 2 (two) times daily as needed for muscle spasms. (Patient not taking: Reported on 07/17/2017) 20 tablet 0 Not Taking  . Multiple Vitamin (MULTIVITAMIN WITH MINERALS) TABS tablet Take 1 tablet by mouth daily.   Not Taking  . promethazine (PHENERGAN) 25 MG tablet Take 1 tablet (25 mg total) by mouth every 6 (six) hours as needed. (Patient not taking: Reported on 10/31/2016) 12 tablet 1 Not Taking   Allergies  Allergen Reactions  . Flexeril [Cyclobenzaprine]     Can not take any muscle relaxer causes respirator depression   . Robaxin [Methocarbamol]     Can not take any muscle relaxer causes respirator depression    Social History   Tobacco Use  . Smoking status: Never Smoker  . Smokeless tobacco: Never Used  Substance Use Topics  . Alcohol use: No    Family History  Problem Relation Age of Onset  . Colon cancer Neg Hx   . Colon polyps Neg Hx      ROS  Review of Systems  Constitutional: Positive for activity change and fatigue.  HENT: Negative for trouble swallowing.   Eyes: Negative for pain.  Respiratory: Negative for shortness of breath.  Cardiovascular: Negative for leg swelling.  Gastrointestinal: Negative for constipation.  Endocrine: Positive for heat intolerance.  Genitourinary: Negative for difficulty urinating.  Musculoskeletal: Positive for gait problem. Negative for joint swelling.  Skin: Negative for rash.  Allergic/Immunologic: Negative for food allergies.  Neurological: Positive for weakness.  Hematological: Does not bruise/bleed easily.  Psychiatric/Behavioral: Positive for sleep disturbance.     Objective:  Physical Exam  Constitutional: He is oriented to person, place, and time. He appears well-developed and well-nourished.  HENT:  Head: Normocephalic and atraumatic.  Eyes: Conjunctivae and EOM are normal. Pupils are equal, round, and reactive to light.  Neck: Neck supple. No thyromegaly present.  Cardiovascular: Normal rate, regular rhythm, normal heart sounds and intact distal pulses.  No murmur heard. Respiratory: Effort normal and breath sounds normal. No respiratory distress. He has no wheezes.  GI: Soft. Bowel sounds are normal. There is no tenderness.  Neurological: He is alert and oriented to person, place, and time.  Skin: Skin is warm and dry.  Psychiatric: He has a normal mood and affect. His behavior is normal. Judgment and thought content normal.  Musculoskeletal he has marked varus deformity of the right knee. I'm able to correct him to neutral. Crepitance with range of motion. Calf is supple and nontender.  Vital signs in last 24 hours: Temp:  [97.8 F (36.6 C)] 97.8 F (36.6 C) (01/16 1013) Pulse Rate:  [60] 60 (01/16 1013) Resp:  [16] 16 (01/16 1013) BP: (136)/(57) 136/57 (01/16 1013) Weight:  [260 lb (117.9 kg)] 260 lb (117.9 kg) (01/16 1013)  Labs:   Estimated body mass index is 38.96 kg/m as calculated from the following:   Height as of 07/17/17: 5' 8.5" (1.74 m).   Weight as of 07/17/17: 260 lb (117.9 kg).   Imaging Review Plain radiographs demonstrate severe degenerative joint disease of the right knee(s). The overall alignment issignificant varus. The bone quality appears to be good for age and reported activity level. He has marked degenerative changes in the more medial than lateral compartments. Apparently there appears to be some wearing away of the medial tibial plateau. Para-articular spurring noted medially more than lateral.   Assessment/Plan:  End stage arthritis, right knee   The patient history, physical examination, clinical  judgment of the provider and imaging studies are consistent with end stage degenerative joint disease of the right knee(s) and total knee arthroplasty is deemed medically necessary. The treatment options including medical management, injection therapy arthroscopy and arthroplasty were discussed at length. The risks and benefits of total knee arthroplasty were presented and reviewed. The risks due to aseptic loosening, infection, stiffness, patella tracking problems, thromboembolic complications and other imponderables were discussed. The patient acknowledged the explanation, agreed to proceed with the plan and consent was signed. Patient is being admitted for inpatient treatment for surgery, pain control, PT, OT, prophylactic antibiotics, VTE prophylaxis, progressive ambulation and ADL's and discharge planning. The patient is planning to be discharged home with home health services  Mike Craze. [place], [place] [phone]  07/17/2017 2:42 PM

## 2017-07-23 ENCOUNTER — Ambulatory Visit (HOSPITAL_COMMUNITY)
Admission: RE | Admit: 2017-07-23 | Discharge: 2017-07-23 | Disposition: A | Discharge status: Home / Self Care | Payer: BLUE CROSS/BLUE SHIELD | Source: Ambulatory Visit | Attending: Orthopedic Surgery | Admitting: Orthopedic Surgery

## 2017-07-23 ENCOUNTER — Encounter (HOSPITAL_COMMUNITY)
Admission: RE | Admit: 2017-07-23 | Discharge: 2017-07-23 | Disposition: A | Discharge status: Home / Self Care | Payer: BLUE CROSS/BLUE SHIELD | Source: Ambulatory Visit | Attending: Orthopaedic Surgery | Admitting: Orthopaedic Surgery

## 2017-07-23 ENCOUNTER — Other Ambulatory Visit: Payer: Self-pay

## 2017-07-23 ENCOUNTER — Encounter (HOSPITAL_COMMUNITY): Payer: Self-pay

## 2017-07-23 DIAGNOSIS — G8929 Other chronic pain: Secondary | ICD-10-CM | POA: Insufficient documentation

## 2017-07-23 DIAGNOSIS — M25561 Pain in right knee: Secondary | ICD-10-CM | POA: Insufficient documentation

## 2017-07-23 DIAGNOSIS — R001 Bradycardia, unspecified: Secondary | ICD-10-CM | POA: Diagnosis not present

## 2017-07-23 DIAGNOSIS — Z01818 Encounter for other preprocedural examination: Secondary | ICD-10-CM | POA: Insufficient documentation

## 2017-07-23 DIAGNOSIS — Z01810 Encounter for preprocedural cardiovascular examination: Secondary | ICD-10-CM | POA: Diagnosis present

## 2017-07-23 HISTORY — DX: Presence of spectacles and contact lenses: Z97.3

## 2017-07-23 LAB — COMPREHENSIVE METABOLIC PANEL
ALT: 30 U/L (ref 17–63)
AST: 26 U/L (ref 15–41)
Albumin: 3.8 g/dL (ref 3.5–5.0)
Alkaline Phosphatase: 68 U/L (ref 38–126)
Anion gap: 12 (ref 5–15)
BILIRUBIN TOTAL: 0.6 mg/dL (ref 0.3–1.2)
BUN: 10 mg/dL (ref 6–20)
CHLORIDE: 99 mmol/L — AB (ref 101–111)
CO2: 24 mmol/L (ref 22–32)
CREATININE: 1.02 mg/dL (ref 0.61–1.24)
Calcium: 9.3 mg/dL (ref 8.9–10.3)
GFR calc Af Amer: >60 mL/min (ref >60)
Glucose, Bld: 100 mg/dL — ABNORMAL HIGH (ref 65–99)
Potassium: 3.9 mmol/L (ref 3.5–5.1)
Sodium: 135 mmol/L (ref 135–145)
TOTAL PROTEIN: 7.3 g/dL (ref 6.5–8.1)

## 2017-07-23 LAB — CBC WITH DIFFERENTIAL/PLATELET
BASOS ABS: 0 10*3/uL (ref 0.0–0.1)
Basophils Relative: 0 %
Eosinophils Absolute: 0.1 10*3/uL (ref 0.0–0.7)
Eosinophils Relative: 1 %
HEMATOCRIT: 42.9 % (ref 39.0–52.0)
HEMOGLOBIN: 13.8 g/dL (ref 13.0–17.0)
LYMPHS PCT: 28 %
Lymphs Abs: 2.2 10*3/uL (ref 0.7–4.0)
MCH: 30.6 pg (ref 26.0–34.0)
MCHC: 32.2 g/dL (ref 30.0–36.0)
MCV: 95.1 fL (ref 78.0–100.0)
Monocytes Absolute: 0.5 10*3/uL (ref 0.1–1.0)
Monocytes Relative: 7 %
NEUTROS ABS: 5.1 10*3/uL (ref 1.7–7.7)
Neutrophils Relative %: 64 %
Platelets: 256 10*3/uL (ref 150–400)
RBC: 4.51 MIL/uL (ref 4.22–5.81)
RDW: 12.9 % (ref 11.5–15.5)
WBC: 8 10*3/uL (ref 4.0–10.5)

## 2017-07-23 LAB — URINALYSIS, ROUTINE W REFLEX MICROSCOPIC
BILIRUBIN URINE: NEGATIVE
Glucose, UA: NEGATIVE mg/dL
Hgb urine dipstick: NEGATIVE
Ketones, ur: NEGATIVE mg/dL
Leukocytes, UA: NEGATIVE
Nitrite: NEGATIVE
PROTEIN: NEGATIVE mg/dL
SPECIFIC GRAVITY, URINE: 1.012 (ref 1.005–1.030)
pH: 6 (ref 5.0–8.0)

## 2017-07-23 LAB — SURGICAL PCR SCREEN
MRSA, PCR: NEGATIVE
STAPHYLOCOCCUS AUREUS: NEGATIVE

## 2017-07-23 LAB — ABO/RH: ABO/RH(D): B POS

## 2017-07-23 LAB — TYPE AND SCREEN
ABO/RH(D): B POS
Antibody Screen: NEGATIVE

## 2017-07-23 LAB — PROTIME-INR
INR: 1.06
PROTHROMBIN TIME: 13.7 s (ref 11.4–15.2)

## 2017-07-23 LAB — APTT: APTT: 37 s — AB (ref 24–36)

## 2017-07-23 IMAGING — CR DG CHEST 2V
2 series · 2 of 2 positions shown · non-contrast
Comparison: Chest x-ray of [DATE]

CLINICAL DATA: Preop for right total knee replacement

EXAM:
CHEST  2 VIEW

[w chest pa]
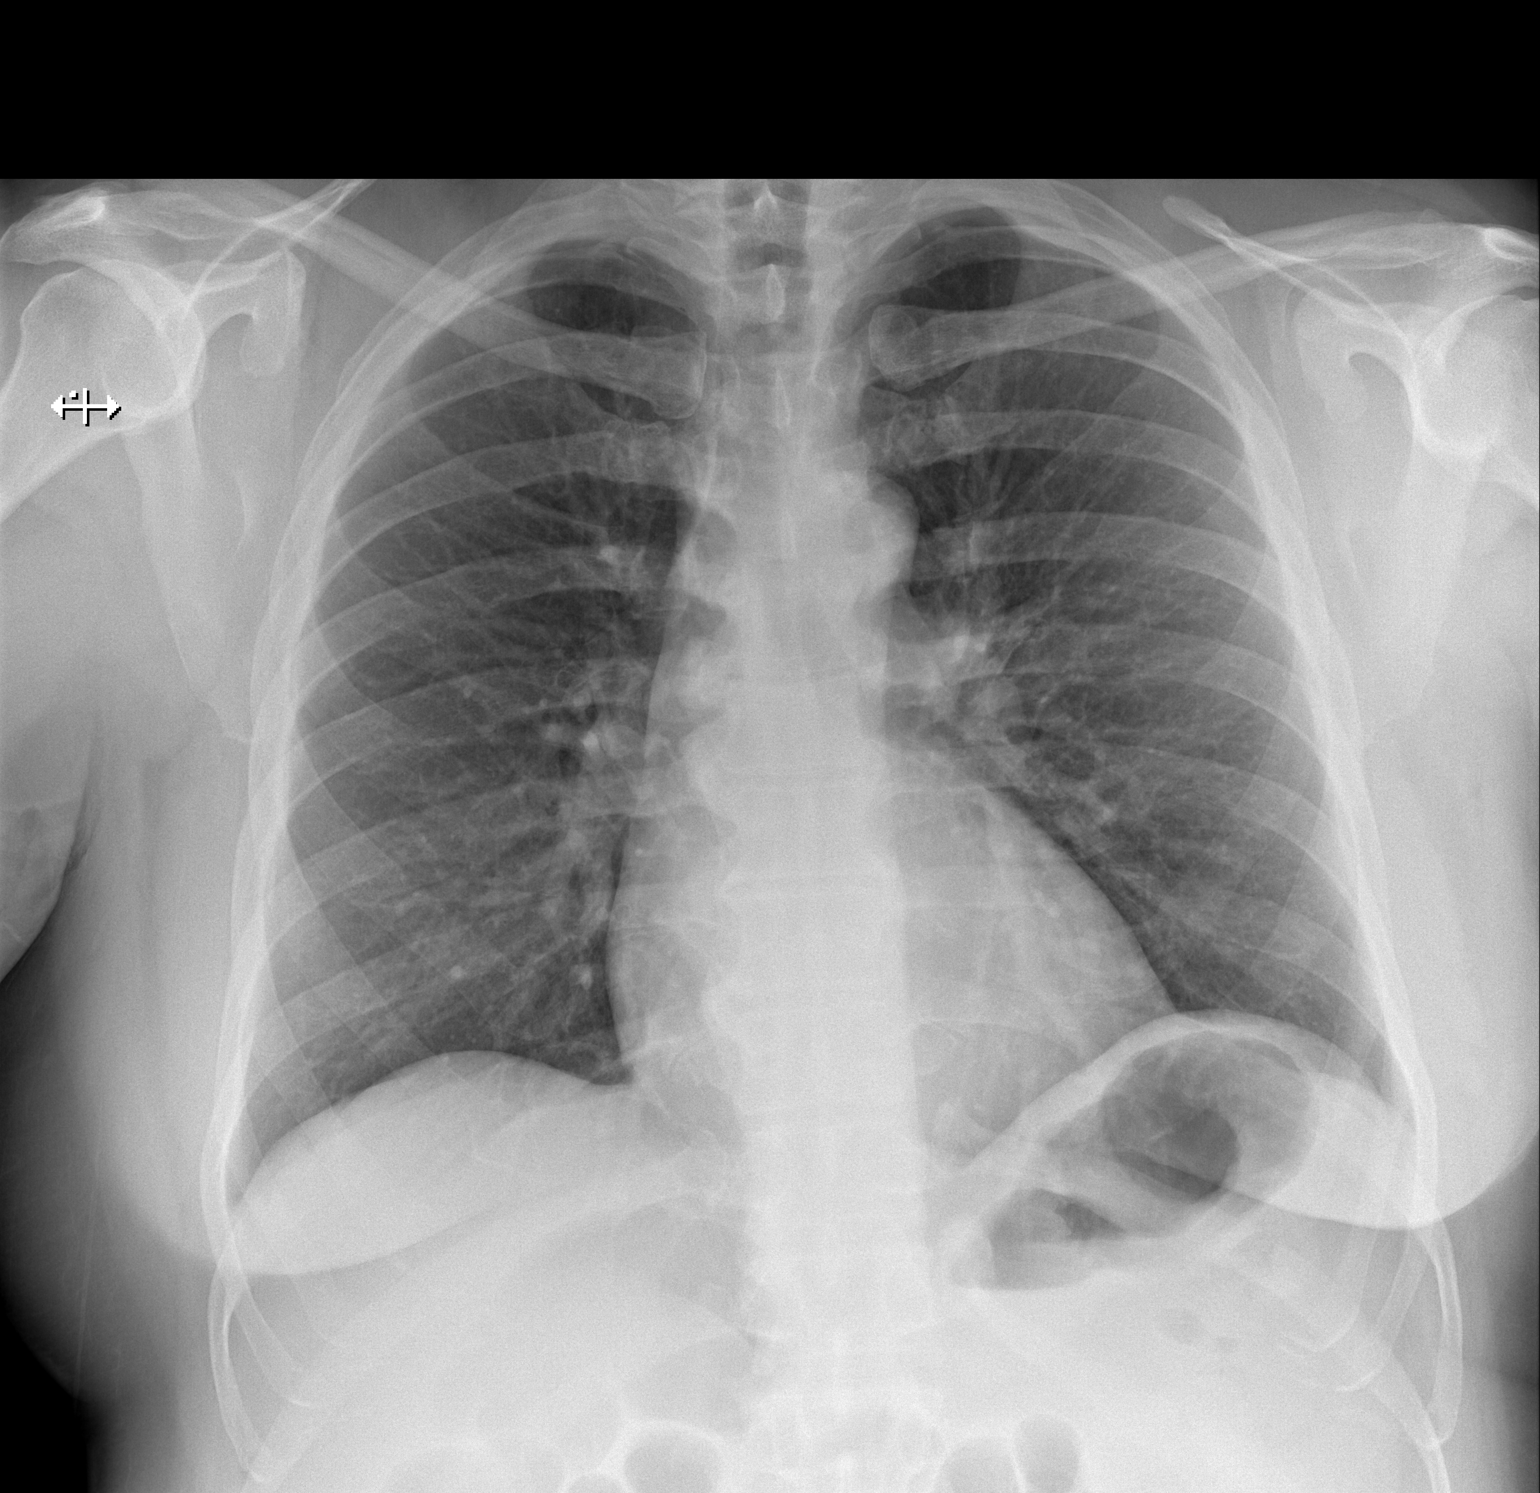

[w chest lat]
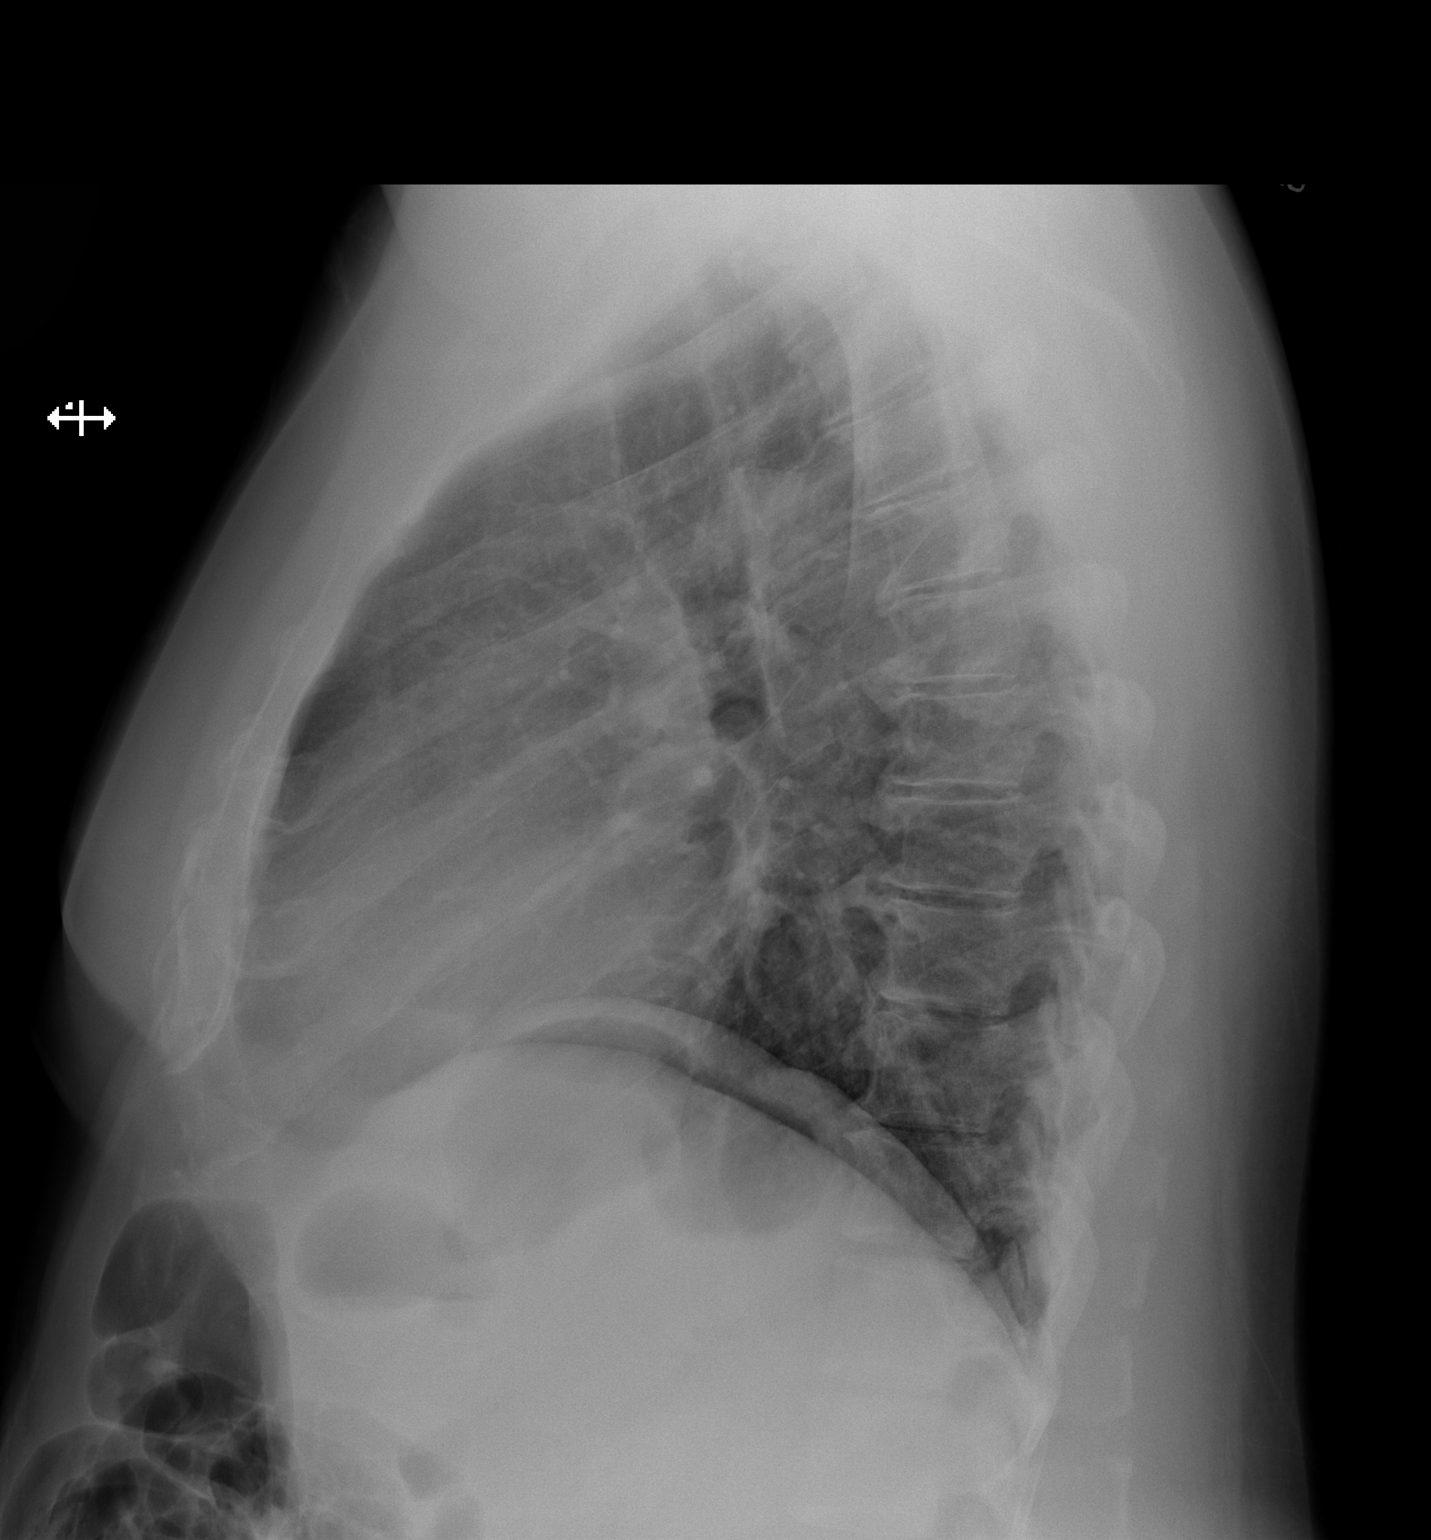

[2 of 2 positions shown; findings below may reference images not displayed]

FINDINGS: No active infiltrate or effusion is seen. Mediastinal and hilar
contours are unremarkable. The heart is within upper limits of
normal. There are mild degenerative changes in the mid to lower
thoracic spine.
IMPRESSION: No active cardiopulmonary disease.

## 2017-07-23 NOTE — Pre-Procedure Instructions (Signed)
AKEEL REFFNER  07/23/2017      [facility] - [place], [place]  [address] Phone: [phone] Fax: [phone]    Your procedure is scheduled on July 30, 2017.  Report to [facility] Admitting at 05:30 A.M.  Call this number if you have problems the morning of surgery:  [phone]   Remember:  Do not eat food or drink liquids after midnight.   Take these medicines the morning of surgery with A SIP OF WATER :  Acetaminophen (Tylenol) if needed  7 days prior to surgery STOP taking any Aspirin (unless otherwise instructed by your surgeon), Aleve, Naproxen, Ibuprofen, Motrin, Advil, Goody's, BC's, all herbal medications, fish oil, and all vitamins.   Do not wear jewelry.  Do not wear lotions, powders, or perfumes, or deodorant.  Do not shave 48 hours prior to surgery.  Men may shave face and neck.  Do not bring valuables to the hospital.   [facility] is not responsible for any belongings or valuables.  Contacts, dentures or bridgework may not be worn into surgery.  Leave your suitcase in the car.  After surgery it may be brought to your room.  For patients admitted to the hospital, discharge time will be determined by your treatment team.  Patients discharged the day of surgery will not be allowed to drive home.   Special instructions:   [facility]- Preparing For Surgery  Before surgery, you can play an important role. Because skin is not sterile, your skin needs to be as free of germs as possible. You can reduce the number of germs on your skin by washing with CHG (chlorahexidine gluconate) Soap before surgery.  CHG is an antiseptic cleaner which kills germs and bonds with the skin to continue killing germs even after washing.  Please do not use if you have an allergy to CHG or antibacterial soaps. If your skin becomes reddened/irritated stop using the CHG.  Do not shave (including legs and underarms)  for at least 48 hours prior to first CHG shower. It is OK to shave your face.  Please follow these instructions carefully.   1. Shower the NIGHT BEFORE SURGERY and the MORNING OF SURGERY with CHG.   2. If you chose to wash your hair, wash your hair first as usual with your normal shampoo.  3. After you shampoo, rinse your hair and body thoroughly to remove the shampoo.  4. Use CHG as you would any other liquid soap. You can apply CHG directly to the skin and wash gently with a scrungie or a clean washcloth.   5. Apply the CHG Soap to your body ONLY FROM THE NECK DOWN.  Do not use on open wounds or open sores. Avoid contact with your eyes, ears, mouth and genitals (private parts). Wash Face and genitals (private parts)  with your normal soap.  6. Wash thoroughly, paying special attention to the area where your surgery will be performed.  7. Thoroughly rinse your body with warm water from the neck down.  8. DO NOT shower/wash with your normal soap after using and rinsing off the CHG Soap.  9. Pat yourself dry with a CLEAN TOWEL.  10. Wear CLEAN PAJAMAS to bed the night before surgery, wear comfortable clothes the morning of surgery  11. Place CLEAN SHEETS on your bed the night of your first shower and DO NOT SLEEP WITH PETS.    Day of Surgery:  Do not apply any deodorants/lotions. Please wear clean clothes to the hospital/surgery center.      Please read over the following fact sheets that you were given. Coughing and Deep Breathing, MRSA Information and Surgical Site Infection Prevention

## 2017-07-23 NOTE — Progress Notes (Signed)
PCP: Dr. Nevada Crane Cardiologist: Denies  EKG: Today CXR: Today ECHO: Denies Stress Test: Denies Cardiac Cath: Denies  Patient denies shortness of breath, fever, cough, and chest pain at PAT appointment.  Patient verbalized understanding of instructions provided today at the PAT appointment.  Patient asked to review instructions at home and day of surgery.

## 2017-07-24 LAB — URINE CULTURE: Culture: <10000 — AB

## 2017-07-29 ENCOUNTER — Other Ambulatory Visit (INDEPENDENT_AMBULATORY_CARE_PROVIDER_SITE_OTHER): Payer: Self-pay

## 2017-07-29 MED ORDER — ACETAMINOPHEN 10 MG/ML IV SOLN
1000.0000 mg | INTRAVENOUS | Status: AC
  Administered 2017-07-30: 1000 mg via INTRAVENOUS
  Filled 2017-07-29: qty 100

## 2017-07-29 MED ORDER — CEFAZOLIN SODIUM-DEXTROSE 2-4 GM/100ML-% IV SOLN
2.0000 g | INTRAVENOUS | Status: AC
  Administered 2017-07-30: 2 g via INTRAVENOUS
  Filled 2017-07-29: qty 100

## 2017-07-29 MED ORDER — SODIUM CHLORIDE 0.9 % IV SOLN
2000.0000 mg | INTRAVENOUS | Status: AC
  Administered 2017-07-30: 2000 mg via TOPICAL
  Filled 2017-07-29: qty 20

## 2017-07-29 MED ORDER — SODIUM CHLORIDE 0.9 % IV SOLN: INTRAVENOUS | Status: DC

## 2017-07-29 NOTE — Anesthesia Preprocedure Evaluation (Addendum)
Anesthesia Evaluation  Patient identified by MRN, date of birth, ID band Patient awake    Reviewed: Allergy & Precautions, NPO status , Patient's Chart, lab work & pertinent test results  Airway Mallampati: II  TM Distance: >3 FB Neck ROM: Full    Dental  (+) Teeth Intact, Dental Advisory Given, Missing,    Pulmonary neg pulmonary ROS,    Pulmonary exam normal breath sounds clear to auscultation       Cardiovascular hypertension, Pt. on medications Normal cardiovascular exam Rhythm:Regular Rate:Normal     Neuro/Psych negative neurological ROS  negative psych ROS   GI/Hepatic negative GI ROS, Neg liver ROS,   Endo/Other  Obesity   Renal/GU negative Renal ROS     Musculoskeletal negative musculoskeletal ROS (+)   Abdominal   Peds  Hematology negative hematology ROS (+) Plt 256k on 07/23/17   Anesthesia Other Findings Day of surgery medications reviewed with the patient.  Reproductive/Obstetrics                           Anesthesia Physical Anesthesia Plan  ASA: II  Anesthesia Plan: Spinal and MAC   Post-op Pain Management:  Regional for Post-op pain   Induction: Intravenous  PONV Risk Score and Plan: 1 and Dexamethasone, Ondansetron, Propofol infusion and Midazolam  Airway Management Planned: Simple Face Mask  Additional Equipment:   Intra-op Plan:   Post-operative Plan:   Informed Consent: I have reviewed the patients History and Physical, chart, labs and discussed the procedure including the risks, benefits and alternatives for the proposed anesthesia with the patient or authorized representative who has indicated his/her understanding and acceptance.   Dental advisory given and Dental Advisory Given  Plan Discussed with: CRNA, Anesthesiologist and Surgeon  Anesthesia Plan Comments: (Discussed risks and benefits of and differences between spinal and general. Discussed risks  of spinal including headache, backache, failure, bleeding, infection, and nerve damage. Patient consents to spinal. Questions answered. Coagulation studies and platelet count acceptable.)      Anesthesia Quick Evaluation

## 2017-07-30 ENCOUNTER — Encounter (HOSPITAL_COMMUNITY): Payer: Self-pay | Admitting: *Deleted

## 2017-07-30 ENCOUNTER — Inpatient Hospital Stay (HOSPITAL_COMMUNITY)
Admission: RE | Admit: 2017-07-30 | Discharge: 2017-08-01 | DRG: 470 | Disposition: A | Discharge status: Home Health | Payer: BLUE CROSS/BLUE SHIELD | Source: Ambulatory Visit | Attending: Orthopaedic Surgery | Admitting: Orthopaedic Surgery

## 2017-07-30 ENCOUNTER — Other Ambulatory Visit: Payer: Self-pay

## 2017-07-30 ENCOUNTER — Encounter (HOSPITAL_COMMUNITY)
Admission: RE | Disposition: A | Discharge status: Home Health | Payer: Self-pay | Source: Ambulatory Visit | Attending: Orthopaedic Surgery

## 2017-07-30 ENCOUNTER — Inpatient Hospital Stay (HOSPITAL_COMMUNITY): Payer: BLUE CROSS/BLUE SHIELD | Admitting: Anesthesiology

## 2017-07-30 DIAGNOSIS — Z6838 Body mass index (BMI) 38.0-38.9, adult: Secondary | ICD-10-CM | POA: Diagnosis not present

## 2017-07-30 DIAGNOSIS — E669 Obesity, unspecified: Secondary | ICD-10-CM | POA: Diagnosis present

## 2017-07-30 DIAGNOSIS — Z7982 Long term (current) use of aspirin: Secondary | ICD-10-CM

## 2017-07-30 DIAGNOSIS — Z79899 Other long term (current) drug therapy: Secondary | ICD-10-CM

## 2017-07-30 DIAGNOSIS — Z96651 Presence of right artificial knee joint: Secondary | ICD-10-CM

## 2017-07-30 DIAGNOSIS — M1711 Unilateral primary osteoarthritis, right knee: Principal | ICD-10-CM | POA: Diagnosis present

## 2017-07-30 DIAGNOSIS — I10 Essential (primary) hypertension: Secondary | ICD-10-CM | POA: Diagnosis present

## 2017-07-30 HISTORY — DX: Pneumonia, unspecified organism: J18.9

## 2017-07-30 HISTORY — PX: TOTAL KNEE ARTHROPLASTY: SHX125

## 2017-07-30 HISTORY — DX: Unspecified osteoarthritis, unspecified site: M19.90

## 2017-07-30 LAB — GLUCOSE, CAPILLARY: Glucose-Capillary: 95 mg/dL (ref 65–99)

## 2017-07-30 SURGERY — ARTHROPLASTY, KNEE, TOTAL
Anesthesia: Spinal | Laterality: Right

## 2017-07-30 MED ORDER — CHLORHEXIDINE GLUCONATE 4 % EX LIQD: 60.0000 mL | Freq: Once | CUTANEOUS | Status: DC

## 2017-07-30 MED ORDER — KETOROLAC TROMETHAMINE 15 MG/ML IJ SOLN
15.0000 mg | Freq: Four times a day (QID) | INTRAMUSCULAR | Status: AC
  Administered 2017-07-30 (×2): 15 mg via INTRAVENOUS
  Filled 2017-07-30 (×2): qty 1

## 2017-07-30 MED ORDER — LOSARTAN POTASSIUM-HCTZ 50-12.5 MG PO TABS: 1.0000 | ORAL_TABLET | Freq: Every day | ORAL | Status: DC

## 2017-07-30 MED ORDER — PHENYLEPHRINE HCL 10 MG/ML IJ SOLN
INTRAVENOUS | Status: DC | PRN
  Administered 2017-07-30: 15 ug/min via INTRAVENOUS

## 2017-07-30 MED ORDER — BUPIVACAINE-EPINEPHRINE 0.25% -1:200000 IJ SOLN
INTRAMUSCULAR | Status: DC | PRN
  Administered 2017-07-30: 30 mL

## 2017-07-30 MED ORDER — 0.9 % SODIUM CHLORIDE (POUR BTL) OPTIME
TOPICAL | Status: DC | PRN
  Administered 2017-07-30: 1000 mL

## 2017-07-30 MED ORDER — BUPIVACAINE-EPINEPHRINE (PF) 0.5% -1:200000 IJ SOLN
INTRAMUSCULAR | Status: AC
  Filled 2017-07-30: qty 30

## 2017-07-30 MED ORDER — POLYETHYLENE GLYCOL 3350 17 G PO PACK: 17.0000 g | PACK | Freq: Every day | ORAL | Status: DC | PRN

## 2017-07-30 MED ORDER — DOCUSATE SODIUM 100 MG PO CAPS
100.0000 mg | ORAL_CAPSULE | Freq: Two times a day (BID) | ORAL | Status: DC
  Administered 2017-07-30 – 2017-08-01 (×4): 100 mg via ORAL
  Filled 2017-07-30 (×4): qty 1

## 2017-07-30 MED ORDER — NALOXONE HCL 0.4 MG/ML IJ SOLN
INTRAMUSCULAR | Status: AC
Start: 2017-07-30 — End: 2017-07-30
  Administered 2017-07-30: 0.2 mg
  Filled 2017-07-30: qty 1

## 2017-07-30 MED ORDER — PROPOFOL 500 MG/50ML IV EMUL
INTRAVENOUS | Status: DC | PRN
  Administered 2017-07-30: 50 ug/kg/min via INTRAVENOUS

## 2017-07-30 MED ORDER — MIDAZOLAM HCL 2 MG/2ML IJ SOLN
INTRAMUSCULAR | Status: AC
  Filled 2017-07-30: qty 2

## 2017-07-30 MED ORDER — ACETAMINOPHEN 10 MG/ML IV SOLN
1000.0000 mg | Freq: Four times a day (QID) | INTRAVENOUS | Status: AC
  Administered 2017-07-31 (×2): 1000 mg via INTRAVENOUS
  Filled 2017-07-30 (×4): qty 100

## 2017-07-30 MED ORDER — OXYCODONE HCL 5 MG PO TABS
5.0000 mg | ORAL_TABLET | ORAL | Status: DC | PRN
  Administered 2017-07-30: 5 mg via ORAL
  Filled 2017-07-30: qty 1

## 2017-07-30 MED ORDER — BUPIVACAINE IN DEXTROSE 0.75-8.25 % IT SOLN
INTRATHECAL | Status: DC | PRN
  Administered 2017-07-30: 2 mL via INTRATHECAL

## 2017-07-30 MED ORDER — ROPIVACAINE HCL 7.5 MG/ML IJ SOLN
INTRAMUSCULAR | Status: DC | PRN
  Administered 2017-07-30: 20 mL via PERINEURAL

## 2017-07-30 MED ORDER — BISACODYL 10 MG RE SUPP: 10.0000 mg | Freq: Every day | RECTAL | Status: DC | PRN

## 2017-07-30 MED ORDER — LACTATED RINGERS IV SOLN
INTRAVENOUS | Status: DC | PRN
  Administered 2017-07-30 (×2): via INTRAVENOUS

## 2017-07-30 MED ORDER — PHENOL 1.4 % MT LIQD
1.0000 | OROMUCOSAL | Status: DC | PRN
Start: 2017-07-30 — End: 2017-08-01

## 2017-07-30 MED ORDER — SODIUM CHLORIDE 0.9 % IR SOLN
Status: DC | PRN
  Administered 2017-07-30: 3000 mL

## 2017-07-30 MED ORDER — DIPHENHYDRAMINE HCL 12.5 MG/5ML PO ELIX: 12.5000 mg | ORAL_SOLUTION | ORAL | Status: DC | PRN

## 2017-07-30 MED ORDER — SODIUM CHLORIDE 0.9 % IV SOLN
INTRAVENOUS | Status: DC
  Administered 2017-07-30: 18:00:00 via INTRAVENOUS

## 2017-07-30 MED ORDER — CEFAZOLIN SODIUM-DEXTROSE 2-4 GM/100ML-% IV SOLN
2.0000 g | Freq: Four times a day (QID) | INTRAVENOUS | Status: AC
  Administered 2017-07-30 (×2): 2 g via INTRAVENOUS
  Filled 2017-07-30 (×2): qty 100

## 2017-07-30 MED ORDER — MAGNESIUM CITRATE PO SOLN: 1.0000 | Freq: Once | ORAL | Status: DC | PRN

## 2017-07-30 MED ORDER — HYDROMORPHONE HCL 1 MG/ML IJ SOLN
0.5000 mg | INTRAMUSCULAR | Status: DC | PRN
  Administered 2017-07-30: 0.5 mg via INTRAVENOUS
  Filled 2017-07-30: qty 1

## 2017-07-30 MED ORDER — GLYCOPYRROLATE 0.2 MG/ML IJ SOLN
INTRAMUSCULAR | Status: DC | PRN
  Administered 2017-07-30: 0.2 mg via INTRAVENOUS

## 2017-07-30 MED ORDER — LOSARTAN POTASSIUM 50 MG PO TABS
50.0000 mg | ORAL_TABLET | Freq: Every day | ORAL | Status: DC
  Administered 2017-07-31 – 2017-08-01 (×2): 50 mg via ORAL
  Filled 2017-07-30 (×2): qty 1

## 2017-07-30 MED ORDER — ONDANSETRON HCL 4 MG PO TABS: 4.0000 mg | ORAL_TABLET | Freq: Four times a day (QID) | ORAL | Status: DC | PRN

## 2017-07-30 MED ORDER — KETOROLAC TROMETHAMINE 15 MG/ML IJ SOLN
INTRAMUSCULAR | Status: AC
  Filled 2017-07-30: qty 1

## 2017-07-30 MED ORDER — ONDANSETRON HCL 4 MG/2ML IJ SOLN
4.0000 mg | Freq: Four times a day (QID) | INTRAMUSCULAR | Status: DC | PRN
  Administered 2017-07-30: 4 mg via INTRAVENOUS
  Filled 2017-07-30: qty 2

## 2017-07-30 MED ORDER — MENTHOL 3 MG MT LOZG: 1.0000 | LOZENGE | OROMUCOSAL | Status: DC | PRN

## 2017-07-30 MED ORDER — OXYCODONE HCL 5 MG PO TABS: 10.0000 mg | ORAL_TABLET | ORAL | Status: DC | PRN

## 2017-07-30 MED ORDER — NALOXONE HCL 0.4 MG/ML IJ SOLN: 0.4000 mg | INTRAMUSCULAR | Status: DC | PRN

## 2017-07-30 MED ORDER — CEFAZOLIN SODIUM-DEXTROSE 2-4 GM/100ML-% IV SOLN
INTRAVENOUS | Status: AC
  Filled 2017-07-30: qty 100

## 2017-07-30 MED ORDER — ALUM & MAG HYDROXIDE-SIMETH 200-200-20 MG/5ML PO SUSP: 30.0000 mL | ORAL | Status: DC | PRN

## 2017-07-30 MED ORDER — FENTANYL CITRATE (PF) 250 MCG/5ML IJ SOLN
INTRAMUSCULAR | Status: AC
  Filled 2017-07-30: qty 5

## 2017-07-30 MED ORDER — MIDAZOLAM HCL 5 MG/5ML IJ SOLN
INTRAMUSCULAR | Status: DC | PRN
  Administered 2017-07-30: 2 mg via INTRAVENOUS

## 2017-07-30 MED ORDER — FENTANYL CITRATE (PF) 100 MCG/2ML IJ SOLN
INTRAMUSCULAR | Status: DC | PRN
  Administered 2017-07-30 (×2): 50 ug via INTRAVENOUS

## 2017-07-30 MED ORDER — HYDROCHLOROTHIAZIDE 12.5 MG PO CAPS
12.5000 mg | ORAL_CAPSULE | Freq: Every day | ORAL | Status: DC
  Administered 2017-07-31 – 2017-08-01 (×2): 12.5 mg via ORAL
  Filled 2017-07-30 (×2): qty 1

## 2017-07-30 MED ORDER — RIVAROXABAN 10 MG PO TABS
10.0000 mg | ORAL_TABLET | Freq: Every day | ORAL | Status: DC
  Administered 2017-07-31 – 2017-08-01 (×2): 10 mg via ORAL
  Filled 2017-07-30 (×2): qty 1

## 2017-07-30 SURGICAL SUPPLY — 63 items
BAG DECANTER FOR FLEXI CONT (MISCELLANEOUS) ×3 IMPLANT
BANDAGE ESMARK 6X9 LF (GAUZE/BANDAGES/DRESSINGS) ×1 IMPLANT
BLADE SAGITTAL 25.0X1.19X90 (BLADE) ×2 IMPLANT
BLADE SAGITTAL 25.0X1.19X90MM (BLADE) ×1
BNDG CMPR 9X6 STRL LF SNTH (GAUZE/BANDAGES/DRESSINGS) ×1
BNDG ESMARK 6X9 LF (GAUZE/BANDAGES/DRESSINGS) ×3
BOWL SMART MIX CTS (DISPOSABLE) ×3 IMPLANT
CAP KNEE TOTAL 3 SIGMA ×2 IMPLANT
CEMENT HV SMART SET (Cement) ×6 IMPLANT
COVER SURGICAL LIGHT HANDLE (MISCELLANEOUS) ×3 IMPLANT
CUFF TOURNIQUET SINGLE 34IN LL (TOURNIQUET CUFF) ×3 IMPLANT
CUFF TOURNIQUET SINGLE 44IN (TOURNIQUET CUFF) IMPLANT
DECANTER SPIKE VIAL GLASS SM (MISCELLANEOUS) ×1 IMPLANT
DRAPE EXTREMITY T 121X128X90 (DRAPE) ×3 IMPLANT
DRAPE HALF SHEET 40X57 (DRAPES) ×6 IMPLANT
DRSG ADAPTIC 3X8 NADH LF (GAUZE/BANDAGES/DRESSINGS) ×3 IMPLANT
DRSG PAD ABDOMINAL 8X10 ST (GAUZE/BANDAGES/DRESSINGS) ×6 IMPLANT
DURAPREP 26ML APPLICATOR (WOUND CARE) ×8 IMPLANT
ELECT CAUTERY BLADE 6.4 (BLADE) ×5 IMPLANT
ELECT REM PT RETURN 9FT ADLT (ELECTROSURGICAL) ×3
ELECTRODE REM PT RTRN 9FT ADLT (ELECTROSURGICAL) ×1 IMPLANT
EVACUATOR 1/8 PVC DRAIN (DRAIN) IMPLANT
FACESHIELD WRAPAROUND (MASK) ×6 IMPLANT
FACESHIELD WRAPAROUND OR TEAM (MASK) ×2 IMPLANT
GAUZE SPONGE 4X4 12PLY STRL (GAUZE/BANDAGES/DRESSINGS) ×3 IMPLANT
GLOVE BIOGEL PI IND STRL 8 (GLOVE) ×1 IMPLANT
GLOVE BIOGEL PI IND STRL 8.5 (GLOVE) ×1 IMPLANT
GLOVE BIOGEL PI INDICATOR 8 (GLOVE) ×2
GLOVE BIOGEL PI INDICATOR 8.5 (GLOVE) ×2
GLOVE ECLIPSE 8.0 STRL XLNG CF (GLOVE) ×6 IMPLANT
GLOVE ECLIPSE 8.5 STRL (GLOVE) ×6 IMPLANT
GOWN STRL REUS W/ TWL LRG LVL3 (GOWN DISPOSABLE) ×2 IMPLANT
GOWN STRL REUS W/TWL 2XL LVL3 (GOWN DISPOSABLE) ×3 IMPLANT
GOWN STRL REUS W/TWL LRG LVL3 (GOWN DISPOSABLE) ×6
HANDPIECE INTERPULSE COAX TIP (DISPOSABLE) ×3
KIT BASIN OR (CUSTOM PROCEDURE TRAY) ×3 IMPLANT
KIT ROOM TURNOVER OR (KITS) ×3 IMPLANT
MANIFOLD NEPTUNE II (INSTRUMENTS) ×3 IMPLANT
NEEDLE 22X1 1/2 (OR ONLY) (NEEDLE) ×3 IMPLANT
NS IRRIG 1000ML POUR BTL (IV SOLUTION) ×3 IMPLANT
PACK TOTAL JOINT (CUSTOM PROCEDURE TRAY) ×3 IMPLANT
PAD ARMBOARD 7.5X6 YLW CONV (MISCELLANEOUS) ×6 IMPLANT
PAD CAST 4YDX4 CTTN HI CHSV (CAST SUPPLIES) ×1 IMPLANT
PADDING CAST COTTON 4X4 STRL (CAST SUPPLIES)
PADDING CAST COTTON 6X4 STRL (CAST SUPPLIES) ×5 IMPLANT
PENCIL BUTTON HOLSTER BLD 10FT (ELECTRODE) ×2 IMPLANT
SET HNDPC FAN SPRY TIP SCT (DISPOSABLE) ×1 IMPLANT
SPONGE LAP 18X18 X RAY DECT (DISPOSABLE) ×4 IMPLANT
STAPLER VISISTAT 35W (STAPLE) ×3 IMPLANT
SUCTION FRAZIER HANDLE 10FR (MISCELLANEOUS) ×2
SUCTION TUBE FRAZIER 10FR DISP (MISCELLANEOUS) ×1 IMPLANT
SURGIFLO W/THROMBIN 8M KIT (HEMOSTASIS) IMPLANT
SUT BONE WAX W31G (SUTURE) ×3 IMPLANT
SUT ETHIBOND NAB CT1 #1 30IN (SUTURE) ×6 IMPLANT
SUT MNCRL AB 3-0 PS2 18 (SUTURE) ×3 IMPLANT
SUT VIC AB 0 CT1 27 (SUTURE) ×3
SUT VIC AB 0 CT1 27XBRD ANBCTR (SUTURE) ×1 IMPLANT
SYR CONTROL 10ML LL (SYRINGE) IMPLANT
TOWEL OR 17X24 6PK STRL BLUE (TOWEL DISPOSABLE) ×3 IMPLANT
TOWEL OR 17X26 10 PK STRL BLUE (TOWEL DISPOSABLE) ×3 IMPLANT
TRAY FOLEY BAG SILVER LF 16FR (SET/KITS/TRAYS/PACK) ×3 IMPLANT
UPCHARGE REV TRAY MBT KNEE ×2 IMPLANT
WRAP KNEE MAXI GEL POST OP (GAUZE/BANDAGES/DRESSINGS) ×3 IMPLANT

## 2017-07-30 NOTE — Anesthesia Procedure Notes (Signed)
Spinal  Patient location during procedure: OR Start time: 07/30/2017 7:25 AM End time: 07/30/2017 7:29 AM Staffing Anesthesiologist: Catalina Gravel, MD Performed: anesthesiologist  Preanesthetic Checklist Completed: patient identified, surgical consent, pre-op evaluation, timeout performed, IV checked, risks and benefits discussed and monitors and equipment checked Spinal Block Patient position: sitting Prep: site prepped and draped and DuraPrep Patient monitoring: continuous pulse ox and blood pressure Approach: midline Location: L3-4 Injection technique: single-shot Needle Needle type: Quincke  Needle gauge: 22 G Assessment Sensory level: T8 Additional Notes Functioning IV was confirmed and monitors were applied. Sterile prep and drape, including hand hygiene, mask and sterile gloves were used. The patient was positioned and the spine was prepped. The skin was anesthetized with lidocaine.  Free flow of clear CSF was obtained prior to injecting local anesthetic into the CSF.  The spinal needle aspirated freely following injection.  The needle was carefully withdrawn.  The patient tolerated the procedure well. Consent was obtained prior to procedure with all questions answered and concerns addressed. Risks including but not limited to bleeding, infection, nerve damage, paralysis, failed block, inadequate analgesia, allergic reaction, high spinal, itching and headache were discussed and the patient wished to proceed.   Hoy Morn, MD

## 2017-07-30 NOTE — Progress Notes (Signed)
PATIENT ID:      [name]  MRN:     [id number] DOB/AGE:    60-May-1957 / 60 y.o.       OPERATIVE REPORT    DATE OF PROCEDURE:  07/30/2017       PREOPERATIVE DIAGNOSIS: End Stage  Osteoarthritis Right Knee                                                       Estimated body mass index is 38.96 kg/m as calculated from the following:   Height as of 07/23/17: 5\' 8"  (1.727 m).   Weight as of 07/23/17: 256 lb 3.2 oz (116.2 kg).     POSTOPERATIVE DIAGNOSIS: End Stage  Osteoarthritis Right Knee                                                                     Estimated body mass index is 38.96 kg/m as calculated from the following:   Height as of 07/23/17: 5\' 8"  (1.727 m).   Weight as of 07/23/17: 256 lb 3.2 oz (116.2 kg).     PROCEDURE:  Procedure(s): RIGHT TOTAL KNEE ARTHROPLASTY      SURGEON:  Joni Fears, MD    ASSISTANT:   Biagio Borg, PA-C   (Present and scrubbed throughout the case, critical for assistance with exposure, retraction, instrumentation, and closure.)          ANESTHESIA: regional, spinal and IV sedation     DRAINS: none :      TOURNIQUET TIME:  Total Tourniquet Time Documented: Thigh (Right) - 110 minutes Total: Thigh (Right) - 110 minutes     COMPLICATIONS:  None   CONDITION:  stable  PROCEDURE IN DETAIL: [place] 07/30/2017, 10:04 AM  Patient ID: [name], male   DOB: 01/26/1956, 62 y.o.   MRN: [id number]

## 2017-07-30 NOTE — Anesthesia Procedure Notes (Signed)
Anesthesia Regional Block: Adductor canal block   Pre-Anesthetic Checklist: ,, timeout performed, Correct Patient, Correct Site, Correct Laterality, Correct Procedure, Correct Position, site marked, Risks and benefits discussed,  Surgical consent,  Pre-op evaluation,  At surgeon's request and post-op pain management  Laterality: Right  Prep: chloraprep       Needles:  Injection technique: Single-shot  Needle Type: Echogenic Needle     Needle Length: 9cm  Needle Gauge: 21     Additional Needles:   Procedures:,,,, ultrasound used (permanent image in chart),,,,  Narrative:  Start time: 07/30/2017 6:44 AM End time: 07/30/2017 6:49 AM Injection made incrementally with aspirations every 5 mL.  Performed by: Personally  Anesthesiologist: Catalina Gravel, MD  Additional Notes: No pain on injection. No increased resistance to injection. Injection made in 5cc increments.  Good needle visualization.  Patient tolerated procedure well.

## 2017-07-30 NOTE — Anesthesia Postprocedure Evaluation (Signed)
Anesthesia Post Note  Patient: [name]  Procedure(s) Performed: RIGHT TOTAL KNEE ARTHROPLASTY (Right )     Patient location during evaluation: PACU Anesthesia Type: Spinal Level of consciousness: oriented, awake and alert and awake Pain management: pain level controlled Vital Signs Assessment: post-procedure vital signs reviewed and stable Respiratory status: spontaneous breathing, respiratory function stable and patient connected to nasal cannula oxygen Cardiovascular status: blood pressure returned to baseline and stable Postop Assessment: no headache, no backache, no apparent nausea or vomiting, spinal receding and patient able to bend at knees Anesthetic complications: no    Last Vitals:  Vitals:   07/30/17 1506 07/30/17 1937  BP: 129/74 (!) 120/55  Pulse: (!) 56 68  Resp: 18 17  Temp: 36.6 C 36.9 C  SpO2: 100% 98%    Last Pain:  Vitals:   07/30/17 1937  TempSrc: Oral  PainSc:                  Catalina Gravel

## 2017-07-30 NOTE — Transfer of Care (Signed)
Immediate Anesthesia Transfer of Care Note  Patient: [name]  Procedure(s) Performed: RIGHT TOTAL KNEE ARTHROPLASTY (Right )  Patient Location: PACU  Anesthesia Type:MAC and Spinal  Level of Consciousness: awake, alert  and oriented  Airway & Oxygen Therapy: Patient Spontanous Breathing  Post-op Assessment: Report given to RN, Post -op Vital signs reviewed and stable and Patient moving all extremities X 4  Post vital signs: Reviewed and stable  Last Vitals:  Vitals:   07/30/17 0547  BP: (!) 149/92  Pulse: (!) 59  Resp: 18  Temp: 36.6 C  SpO2: 100%    Last Pain:  Vitals:   07/30/17 0547  TempSrc: Oral      Patients Stated Pain Goal: 0 (71/29/29 0903)  Complications: No apparent anesthesia complications

## 2017-07-30 NOTE — Progress Notes (Signed)
Orthopedic Tech Progress Note Patient Details:  [name] [birth year] [id number]  CPM Right Knee CPM Right Knee: On Right Knee Flexion (Degrees): 90 Right Knee Extension (Degrees): 0 Additional Comments: trapeze bar patient helper  Post Interventions Patient Tolerated: Well Instructions Provided: Care of device  Hildred Priest 07/30/2017, 11:01 AM Viewed order from doctor's order list

## 2017-07-30 NOTE — Evaluation (Signed)
Physical Therapy Evaluation Patient Details Name: [name] MRN: [id number] DOB: 1955-09-11 Today's Date: 07/30/2017   History of Present Illness  Pt is a 62 y/o male s/p elective R TKA. PMH includes HTN.   Clinical Impression  Pt s/p surgery above with deficits below. Gait distance limited to chair this session secondary to pain. Pt also unsteady requiring min to min guard assist. Supine HEP initiated. Will continue to follow acutely to maximize functional mobility independence and safety.     Follow Up Recommendations DC plan and follow up therapy as arranged by surgeon;Supervision for mobility/OOB    Equipment Recommendations  Rolling walker with 5" wheels;3in1 (PT)    Recommendations for Other Services       Precautions / Restrictions Precautions Precautions: Knee Precaution Booklet Issued: Yes (comment) Precaution Comments: Reviewed supine ther ex  Restrictions Weight Bearing Restrictions: Yes RLE Weight Bearing: Partial weight bearing RLE Partial Weight Bearing Percentage or Pounds: 50      Mobility  Bed Mobility Overal bed mobility: Needs Assistance Bed Mobility: Supine to Sit     Supine to sit: Min assist     General bed mobility comments: Min A for trunk elevation and for RLE assist. Required use of bed rails and elevated HOB.   Transfers Overall transfer level: Needs assistance Equipment used: Rolling walker (2 wheeled) Transfers: Sit to/from Stand Sit to Stand: Min assist;From elevated surface         General transfer comment: Min A for lift assist and steadying from elevated surface.   Ambulation/Gait Ambulation/Gait assistance: Min assist;Min guard Ambulation Distance (Feet): 5 Feet Assistive device: Rolling walker (2 wheeled) Gait Pattern/deviations: Step-to pattern;Decreased step length - left;Decreased step length - right;Decreased weight shift to right;Antalgic Gait velocity: Decreased Gait velocity interpretation: Below normal speed  for age/gender General Gait Details: Slow, antalgic, slightly unsteady gait. Verbal cues to use UEs to decreased weight on RLE to ensure PWB. Verbal cues for sequencing using RW. Distance limited secondary to pain.   Stairs            Wheelchair Mobility    Modified Rankin (Stroke Patients Only)       Balance Overall balance assessment: Needs assistance Sitting-balance support: No upper extremity supported;Feet supported Sitting balance-Leahy Scale: Good     Standing balance support: Bilateral upper extremity supported;During functional activity Standing balance-Leahy Scale: Poor Standing balance comment: Reliant on BUE support                              Pertinent Vitals/Pain Pain Assessment: 0-10 Pain Score: 8  Pain Location: R knee  Pain Descriptors / Indicators: Aching;Operative site guarding Pain Intervention(s): Limited activity within patient's tolerance;Monitored during session;Repositioned    Home Living Family/patient expects to be discharged to:: Private residence Living Arrangements: Spouse/significant other;Children Available Help at Discharge: Family;Available 24 hours/day Type of Home: House Home Access: Stairs to enter Entrance Stairs-Rails: None Entrance Stairs-Number of Steps: 2 Home Layout: One level Home Equipment: None      Prior Function Level of Independence: Independent               Hand Dominance        Extremity/Trunk Assessment   Upper Extremity Assessment Upper Extremity Assessment: Defer to OT evaluation    Lower Extremity Assessment Lower Extremity Assessment: RLE deficits/detail RLE Deficits / Details: Sensory in tact. Deficits consistent with post op pain and weakness. Able to perform ther ex below.  Cervical / Trunk Assessment Cervical / Trunk Assessment: Normal  Communication   Communication: No difficulties  Cognition Arousal/Alertness: Awake/alert Behavior During Therapy: WFL for tasks  assessed/performed Overall Cognitive Status: Within Functional Limits for tasks assessed                                        General Comments      Exercises Total Joint Exercises Ankle Circles/Pumps: AROM;Both;20 reps Quad Sets: AROM;Right;10 reps Heel Slides: AAROM;Right;10 reps   Assessment/Plan    PT Assessment Patient needs continued PT services  PT Problem List Decreased strength;Decreased range of motion;Decreased balance;Decreased activity tolerance;Decreased mobility;Decreased knowledge of use of DME;Decreased knowledge of precautions;Pain       PT Treatment Interventions DME instruction;Gait training;Stair training;Functional mobility training;Therapeutic activities;Therapeutic exercise;Balance training;Neuromuscular re-education;Patient/family education    PT Goals (Current goals can be found in the Care Plan section)  Acute Rehab PT Goals Patient Stated Goal: to get better PT Goal Formulation: With patient Time For Goal Achievement: 08/13/17 Potential to Achieve Goals: Good    Frequency 7X/week   Barriers to discharge        Co-evaluation               AM-PAC PT "6 Clicks" Daily Activity  Outcome Measure Difficulty turning over in bed (including adjusting bedclothes, sheets and blankets)?: A Little Difficulty moving from lying on back to sitting on the side of the bed? : Unable Difficulty sitting down on and standing up from a chair with arms (e.g., wheelchair, bedside commode, etc,.)?: Unable Help needed moving to and from a bed to chair (including a wheelchair)?: A Little Help needed walking in hospital room?: A Little Help needed climbing 3-5 steps with a railing? : A Lot 6 Click Score: 13    End of Session Equipment Utilized During Treatment: Gait belt Activity Tolerance: Patient limited by pain Patient left: in chair;with call bell/phone within reach;with family/visitor present Nurse Communication: Mobility status PT Visit  Diagnosis: Other abnormalities of gait and mobility (R26.89);Pain Pain - Right/Left: Right Pain - part of body: Knee    Time: 6811-5726 PT Time Calculation (min) (ACUTE ONLY): 23 min   Charges:   PT Evaluation $PT Eval Low Complexity: 1 Low PT Treatments $Therapeutic Activity: 8-22 mins   PT G Codes:        Leighton Ruff, PT, DPT  Acute Rehabilitation Services  Pager: [phone]   Rudean Hitt 07/30/2017, 6:47 PM

## 2017-07-30 NOTE — Progress Notes (Signed)
The recent History & Physical has been reviewed. I have personally examined the patient today. There is no interval change to the documented History & Physical. The patient would like to proceed with the procedure.  [name] 07/30/2017,  7:03 AM  Patient ID: [name], male   DOB: 1956/03/14, 62 y.o.   MRN: [id number]

## 2017-07-31 ENCOUNTER — Encounter (HOSPITAL_COMMUNITY): Payer: Self-pay | Admitting: Orthopaedic Surgery

## 2017-07-31 LAB — CBC
HEMATOCRIT: 35.6 % — AB (ref 39.0–52.0)
Hemoglobin: 11.6 g/dL — ABNORMAL LOW (ref 13.0–17.0)
MCH: 31.2 pg (ref 26.0–34.0)
MCHC: 32.6 g/dL (ref 30.0–36.0)
MCV: 95.7 fL (ref 78.0–100.0)
Platelets: 207 10*3/uL (ref 150–400)
RBC: 3.72 MIL/uL — AB (ref 4.22–5.81)
RDW: 13.1 % (ref 11.5–15.5)
WBC: 11.3 10*3/uL — AB (ref 4.0–10.5)

## 2017-07-31 LAB — BASIC METABOLIC PANEL
ANION GAP: 10 (ref 5–15)
BUN: 8 mg/dL (ref 6–20)
CALCIUM: 8.3 mg/dL — AB (ref 8.9–10.3)
CO2: 22 mmol/L (ref 22–32)
Chloride: 102 mmol/L (ref 101–111)
Creatinine, Ser: 0.81 mg/dL (ref 0.61–1.24)
GFR calc non Af Amer: >60 mL/min (ref >60)
GLUCOSE: 141 mg/dL — AB (ref 65–99)
Potassium: 3.5 mmol/L (ref 3.5–5.1)
Sodium: 134 mmol/L — ABNORMAL LOW (ref 135–145)

## 2017-07-31 MED ORDER — TRAMADOL HCL 50 MG PO TABS
100.0000 mg | ORAL_TABLET | Freq: Three times a day (TID) | ORAL | Status: DC | PRN
  Administered 2017-07-31 – 2017-08-01 (×3): 100 mg via ORAL
  Filled 2017-07-31 (×3): qty 2

## 2017-07-31 MED ORDER — ACETAMINOPHEN 325 MG PO TABS
650.0000 mg | ORAL_TABLET | Freq: Four times a day (QID) | ORAL | Status: DC | PRN
  Administered 2017-07-31 – 2017-08-01 (×2): 650 mg via ORAL
  Filled 2017-07-31 (×2): qty 2

## 2017-07-31 NOTE — Progress Notes (Signed)
After 5pm call service contacted due to pt having a temp of 101.8. Pt does not have tylenol order or any med to reduce fever.  Awaiting call from covering MD.

## 2017-07-31 NOTE — Progress Notes (Signed)
Physical Therapy Treatment Patient Details Name: [name] MRN: [id number] DOB: 04/02/1956 Today's Date: 07/31/2017    History of Present Illness Pt is a 62 y/o male s/p elective R TKA. PMH includes HTN.     PT Comments    This session focused on L LE therex and HEP handout and positioning reviewed with pt. Patient needs to practice stairs next session.      Follow Up Recommendations  DC plan and follow up therapy as arranged by surgeon;Supervision for mobility/OOB     Equipment Recommendations  Rolling walker with 5" wheels;3in1 (PT)    Recommendations for Other Services       Precautions / Restrictions Precautions Precautions: Knee Precaution Booklet Issued: Yes (comment) Precaution Comments: precautions reviewed with pt  Restrictions Weight Bearing Restrictions: Yes RLE Weight Bearing: Partial weight bearing RLE Partial Weight Bearing Percentage or Pounds: 50    Mobility  Bed Mobility Overal bed mobility: Needs Assistance Bed Mobility: Supine to Sit     Supine to sit: Min assist     General bed mobility comments: assist to bring R LE to EOB   Transfers Overall transfer level: Needs assistance Equipment used: Rolling walker (2 wheeled) Transfers: Sit to/from Stand Sit to Stand: Min guard         General transfer comment: cues for safe hand placement and technique; assist to power up into standing  Ambulation/Gait                 Stairs            Wheelchair Mobility    Modified Rankin (Stroke Patients Only)       Balance Overall balance assessment: Needs assistance Sitting-balance support: No upper extremity supported;Feet supported Sitting balance-Leahy Scale: Good     Standing balance support: Bilateral upper extremity supported;During functional activity Standing balance-Leahy Scale: Poor Standing balance comment: Reliant on BUE support                             Cognition Arousal/Alertness:  Awake/alert Behavior During Therapy: Flat affect Overall Cognitive Status: Within Functional Limits for tasks assessed                                        Exercises Total Joint Exercises Ankle Circles/Pumps: AROM;Both;10 reps Quad Sets: AROM;Right;10 reps Short Arc QuadSinclair Ship;Right;10 reps Heel Slides: AAROM;Right;10 reps Hip ABduction/ADduction: AROM;AAROM;Right;10 reps Straight Leg Raises: AAROM;Right;10 reps    General Comments        Pertinent Vitals/Pain Pain Assessment: 0-10 Pain Score: 5  Faces Pain Scale: Hurts little more Pain Location: R knee  Pain Descriptors / Indicators: Aching;Sore Pain Intervention(s): Limited activity within patient's tolerance;Monitored during session;Repositioned;Ice applied    Home Living Family/patient expects to be discharged to:: Private residence Living Arrangements: Spouse/significant other;Children Available Help at Discharge: Family;Available 24 hours/day Type of Home: House Home Access: Stairs to enter Entrance Stairs-Rails: None Home Layout: One level Home Equipment: None      Prior Function Level of Independence: Independent          PT Goals (current goals can now be found in the care plan section) Acute Rehab PT Goals Patient Stated Goal: to get better PT Goal Formulation: With patient Time For Goal Achievement: 08/13/17 Potential to Achieve Goals: Good Progress towards PT goals: Progressing toward goals    Frequency  7X/week      PT Plan Current plan remains appropriate    Co-evaluation              AM-PAC PT "6 Clicks" Daily Activity  Outcome Measure  Difficulty turning over in bed (including adjusting bedclothes, sheets and blankets)?: A Little Difficulty moving from lying on back to sitting on the side of the bed? : Unable Difficulty sitting down on and standing up from a chair with arms (e.g., wheelchair, bedside commode, etc,.)?: Unable Help needed moving to and from a  bed to chair (including a wheelchair)?: A Little Help needed walking in hospital room?: A Little Help needed climbing 3-5 steps with a railing? : A Lot 6 Click Score: 13    End of Session   Activity Tolerance: Patient tolerated treatment well Patient left: with call bell/phone within reach;in bed Nurse Communication: Mobility status PT Visit Diagnosis: Other abnormalities of gait and mobility (R26.89);Pain Pain - Right/Left: Right Pain - part of body: Knee     Time: 4917-9150 PT Time Calculation (min) (ACUTE ONLY): 30 min  Charges:  $Therapeutic Exercise: 23-37 mins                    G Codes:       Earney Navy, PTA Pager: [phone]     Darliss Cheney 07/31/2017, 4:53 PM

## 2017-07-31 NOTE — Plan of Care (Signed)
  Progressing Education: Knowledge of General Education information will improve 07/31/2017 1033 - Progressing by Rance Muir, RN Health Behavior/Discharge Planning: Ability to manage health-related needs will improve 07/31/2017 1033 - Progressing by Rance Muir, RN Clinical Measurements: Ability to maintain clinical measurements within normal limits will improve 07/31/2017 1033 - Progressing by Rance Muir, RN Will remain free from infection 07/31/2017 1033 - Progressing by Rance Muir, RN Diagnostic test results will improve 07/31/2017 1033 - Progressing by Rance Muir, RN Respiratory complications will improve 07/31/2017 1033 - Progressing by Rance Muir, RN Cardiovascular complication will be avoided 07/31/2017 1033 - Progressing by Rance Muir, RN Activity: Risk for activity intolerance will decrease 07/31/2017 1033 - Progressing by Rance Muir, RN Nutrition: Adequate nutrition will be maintained 07/31/2017 1033 - Progressing by Rance Muir, RN Coping: Level of anxiety will decrease 07/31/2017 1033 - Progressing by Rance Muir, RN Elimination: Will not experience complications related to bowel motility 07/31/2017 1033 - Progressing by Rance Muir, RN Will not experience complications related to urinary retention 07/31/2017 1033 - Progressing by Rance Muir, RN Pain Managment: General experience of comfort will improve 07/31/2017 1033 - Progressing by Rance Muir, RN Safety: Ability to remain free from injury will improve 07/31/2017 1033 - Progressing by Rance Muir, RN Skin Integrity: Risk for impaired skin integrity will decrease 07/31/2017 1033 - Progressing by Rance Muir, RN

## 2017-07-31 NOTE — Progress Notes (Signed)
Dr. Lorin Mercy returned call and order given for tylenol.  Will continue to monitor.

## 2017-07-31 NOTE — Op Note (Signed)
NAME:  [name], [name] NO.:  MEDICAL RECORD NO.:  [id number]  LOCATION:                                 FACILITY:  PHYSICIAN:  Vonna Kotyk. Eryca Bolte, M.D.DATE OF BIRTH:  [birth year]  DATE OF PROCEDURE: DATE OF DISCHARGE:                              OPERATIVE REPORT   PREOPERATIVE DIAGNOSE:  End-stage osteoarthritis, right knee.  POSTOPERATIVE DIAGNOSIS:  End-stage osteoarthritis, right knee.  PROCEDURE:  Right total knee replacement.  SURGEON:  Vonna Kotyk. Durward Fortes, MD.  ASSISTANT:  Biagio Borg, PAC.  ANESTHESIA:  Spinal with supplemental adductor canal block and IV sedation.  COMPLICATIONS:  None.  COMPONENTS:  DePuy LCS standard plus femoral component, a #4 revision tibial tray with a 10-mm polyethylene bridging bearing and metal-backed 3-peg rotating patella.  Components were secured with polymethyl methacrylate.  DESCRIPTION OF PROCEDURE:  Mr. [name] was met with his family in the holding area, identified the right knee as the appropriate operative site and marked it accordingly.  Anesthesia performed an adductor canal block.  Any questions were answered.  Mr. [name] was then transported to room #7.  Spinal anesthetic was provided by Anesthesia.  The patient was then placed in the supine position.  A tourniquet was applied to the right thigh.  The leg was then prepped with chlorhexidine scrub and DuraPrep x2 from the tourniquet to the tips of the toes.  Sterile draping was performed.  Time-out was called.  The right lower extremity was then elevated, Esmarch exsanguinated with a proximal tourniquet at 350 mmHg.  A midline longitudinal incision was made centered about the patella extending from the superior pouch to tibial tubercle.  Via sharp dissection, incision was carried down to subcutaneous tissue.  First layer of capsule was incised in the midline.  A medial parapatellar incision was then made through the deep capsule.  The  joint was entered. There was a small clear yellow joint effusion.  The patella was everted 180 degrees laterally and the knee flexed at 90 degrees.  There was complete absence of articular cartilage on the medial femoral condyle and medial tibia plateau.  There was a fixed 10-degree varus position of the knee with a fixed flexion contracture.  I performed a medial release subperiosteally with a Cobb elevator.  At that point, I could place the knee in neutral.  Because of the patient's size, I elected to use the revision tibial tray.  I used a 3-degree transverse cut on the proximal tibia using the external guide.  I checked my alignment with each tibial and femoral cut with the external jig.  Standard plus femoral jigs were then applied to the femur.  The first bony cuts were then made followed by a 4-degree distal femoral valgus cut.  I checked my alignment as mentioned with each cut.  MCL and LCL remained intact.  Lamina spreaders were then inserted along the medial femoral compartments.  Removed medial and lateral menisci, ACL, and PCL. There were large osteophytes along the posterior femoral condyle, which were removed with 3/4-inch curved osteotome.  There was a large fabella noted laterally.  This was skeletonized.  We then checked flexion/extension gaps and  appeared to be perfectly symmetrical at 10 mm.  Final cuts were then made on the femur using for tapering and obtain the center holes  Retractors were then placed about the tibia.  It was advanced anteriorly and measured a #4 tibial tray.  This was pinned in place.  The center hole was then made.  This was removed.  The trial revision tibia was then inserted and the keel cut made.  With this in place, the 10-mm polyethylene bridging bearing was applied followed by the trial standard plus femoral component.  This was reduced and through full range of motion, appeared to be perfectly stable with full extension, no  opening with varus or valgus stress.  The patella was then prepared by removing 10 mm of bone leaving 14 mm of patella thickness.  The trial patella was inserted after drilling the 3 trial holes.  The patella was reduced and through a full range of motion, it remained perfectly stable.  The trial components were then removed.  I copiously irrigated the joint with saline solution.  There was still some posterior sloping of the tibia from the severe varus deformity and I drilled these areas with a small drill hole to secure the cement.  The final components were then impacted with polymethyl methacrylate.  The revision tibial tray was then placed with polymethyl methacrylate.  Extraneous methacrylate was removed from the periphery of the component.  A 10-mm polyethylene bridging bearing was then applied.  We then applied the standard plus femoral component.  The joint was reduced and compressed and any extra further extraneous methacrylate was removed with a Valora Corporal.  Patella was applied with a patellar clamp and methacrylate and again any extraneous methacrylate removed from the periphery of that component. At approximately 16 minutes, the methacrylate had matured during which time we injected the joint with 0.25% Marcaine with epinephrine.  At 110 minutes, the tourniquet was released.  We had nice capillary refill to the joint surface.  Any gross bleeders were Bovie coagulated.  Any bone bleeding was controlled with bone wax.  We then applied tranexamic acid topically under compression for at least 5 minutes, thought we had a nice dry field.  There was a partial tear of the patellar tendon.  This was secured with #1 Ethibond.  The deep capsule was closed with the similar material.  Hemovac was not necessary.  The superficial capsule was closed with 0 Vicryl, the subcu with 2-0 Vicryl and 3-0 Monocryl. Skin was closed with skin clips.  Sterile bulky dressing was applied.  Technically,  it was a difficult procedure.  The bone was quite hard. There was a fixed varus position and required multiple cuts to obtain the appropriate alignment.  Thought the end result was excellent with very nice alignment and position of the components.  The patient tolerated the procedure without complications.     Vonna Kotyk. Durward Fortes, M.D.     PWW/MEDQ  D:  07/30/2017  T:  07/30/2017  Job:  [id number]

## 2017-07-31 NOTE — Care Management Note (Signed)
Case Management Note  Patient Details  Name: TERRACE FONTANILLA MRN: [id number] Date of Birth: 1956/06/03  Subjective/Objective:  62 yr old gentleman s/p right total knee arthroplasty.                 Action/Plan: Patient was preoperatively setup with [facility], no changes. Will have support at discharge.   Expected Discharge Date:   07/31/17               Expected Discharge Plan:  [place]  In-House Referral:  NA  Discharge planning Services  CM Consult  Post Acute Care Choice:  Durable Medical Equipment, Home Health Choice offered to:  Patient  DME Arranged:  3-N-1, Walker rolling, CPM DME Agency:  [employer]  HH Arranged:  PT [place]:  [facility] [employer]  Status of Service:  Completed, signed off  If discussed at [employer], dates discussed:    Additional Comments:  Ninfa Meeker, RN 07/31/2017, 2:56 PM

## 2017-07-31 NOTE — Discharge Instructions (Signed)

## 2017-07-31 NOTE — Progress Notes (Signed)
PATIENT ID: [name]        MRN:  [id number]          DOB/AGE: 02-16-1956 / 62 y.o.    Cameron Fears, MD   Biagio Borg, PA-C [address] [place], [place]  [address]                             [phone]   PROGRESS NOTE  Subjective:  negative for Chest Pain  negative for Shortness of Breath  negative for Nausea/Vomiting   negative for Calf Pain    Tolerating Diet: yes         Patient reports pain as mild.     Difficulty with oxycodone causing SOB-will try tramadol  Objective: Vital signs in last 24 hours:    Patient Vitals for the past 24 hrs:  BP Temp Temp src Pulse Resp SpO2  07/31/17 0358 114/60 99.1 F (37.3 C) Oral 71 16 100 %  07/30/17 2353 (!) 120/55 98 F (36.7 C) Oral 64 17 99 %  07/30/17 1937 (!) 120/55 98.5 F (36.9 C) Oral 68 17 98 %  07/30/17 1506 129/74 97.8 F (36.6 C) Oral (!) 56 18 100 %  07/30/17 1437 - - - (!) 58 19 100 %  07/30/17 1434 131/65 - - - - -  07/30/17 1428 131/65 97.8 F (36.6 C) - (!) 55 18 100 %  07/30/17 1358 - - - (!) 54 15 100 %  07/30/17 1348 131/69 - - - - -  07/30/17 1315 - - - (!) 50 13 100 %  07/30/17 1304 120/61 - - - - -  07/30/17 1229 - - - (!) 50 15 100 %  07/30/17 1218 135/73 - - - - -  07/30/17 1207 - - - (!) 51 16 100 %  07/30/17 1203 120/69 - - - - -  07/30/17 1200 - - - (!) 51 17 100 %  07/30/17 1148 124/76 - - - - -  07/30/17 1135 118/71 - - (!) 56 19 99 %  07/30/17 1130 - - - (!) 50 15 98 %  07/30/17 1118 117/71 - - - - -  07/30/17 1109 - - - (!) 51 15 99 %  07/30/17 1103 119/70 - - - - -  07/30/17 1052 - - - (!) 52 13 99 %  07/30/17 1048 120/78 - - - - -  07/30/17 1036 121/72 97.7 F (36.5 C) - (!) 50 13 97 %      Intake/Output from previous day:   01/29 0701 - 01/30 0700 In: 2831.6 [P.O.:957; I.V.:1354.6] Out: 2100 [Urine:2050]   Intake/Output this shift:   No intake/output data recorded.   Intake/Output      01/29 0701 - 01/30 0700 01/30 0701 - 01/31 0700   P.O. 957    I.V.  1354.6    Other 220    IV Piggyback 300    Total Intake 2831.6    Urine 2050    Blood 50    Total Output 2100    Net +731.6         Urine Occurrence 1 x       LABORATORY DATA: No results for input(s): WBC, HGB, HCT, PLT in the last 168 hours. No results for input(s): NA, K, CL, CO2, BUN, CREATININE, GLUCOSE, CALCIUM in the last 168 hours. Lab Results  Component Value Date   INR 1.06 07/23/2017    Recent Radiographic  Studies :  Dg Chest 2 View  Result Date: 07/23/2017 CLINICAL DATA:  Preop for right total knee replacement EXAM: CHEST  2 VIEW COMPARISON:  Chest x-ray of 03/17/2017 FINDINGS: No active infiltrate or effusion is seen. Mediastinal and hilar contours are unremarkable. The heart is within upper limits of normal. There are mild degenerative changes in the mid to lower thoracic spine. IMPRESSION: No active cardiopulmonary disease. Electronically Signed   By: Ivar Drape M.D.   On: 07/23/2017 15:04     Examination:  General appearance: alert, cooperative and no distress  Wound Exam: clean, dry, intact   Drainage:  None: wound tissue dry  Motor Exam: EHL, FHL, Anterior Tibial and Posterior Tibial Intact  Sensory Exam: Superficial Peroneal, Deep Peroneal and Tibial normal  Vascular Exam: Normal  Assessment:    1 Day Post-Op  Procedure(s) (LRB): RIGHT TOTAL KNEE ARTHROPLASTY (Right)  ADDITIONAL DIAGNOSIS:  Principal Problem:   Primary osteoarthritis of right knee Active Problems:   S/P TKR (total knee replacement) using cement, right     Plan: Physical Therapy as ordered Partial Weight Bearing @ 50% (PWB)  DVT Prophylaxis:  Xarelto, Foot Pumps and TED hose  DISCHARGE PLAN: Home  DISCHARGE NEEDS: HHPT, CPM, Walker and 3-in-1 comode seat Some blood in urine from traumatic foley insertion-should clear. Comfortable this am. OOB with PT, Hope for D/C tomorrow. Labs not available yet       Biagio Borg, [name]  07/31/2017 7:54  AM  Patient ID: [name], male   DOB: [birth year], 62 y.o.   MRN: [id number]

## 2017-07-31 NOTE — Progress Notes (Signed)
OT Evaluation  PTA, pt lived at home with wife and was independent with ADL and mobility. Pt currently requires min A with LB ADL and functional mobility secondary to deficits below. Will follow acutely to educate on compensatory techniques for ADL and safe tub transfers. Wife will be able to assist as needed after DC.    07/31/17 1600  OT Visit Information  Last OT Received On 07/31/17  Assistance Needed +1  History of Present Illness Pt is a 62 y/o male s/p elective R TKA. PMH includes HTN.   Precautions  Precautions Knee  Precaution Booklet Issued Yes (comment)  Precaution Comments precautions reviewed with pt and wife  Restrictions  Weight Bearing Restrictions Yes  RLE Weight Bearing PWB  RLE Partial Weight Bearing Percentage or Pounds 50  Home Living  Family/patient expects to be discharged to: Private residence  Living Arrangements Spouse/significant other;Children  Available Help at Discharge Family;Available 24 hours/day  Type of [place] to enter  Entrance Stairs-Number of Steps 2  Entrance Stairs-Rails None  Home Layout One level  Bathroom Shower/Tub Tub/shower unit  Bathroom Toilet Handicapped height  Bathroom Accessibility Yes  How Accessible Accessible via walker  Home Equipment None  Prior Function  Level of Independence Independent  Communication  Communication No difficulties  Pain Assessment  Pain Assessment 0-10  Pain Score 5  Pain Location R knee   Pain Descriptors / Indicators Aching;Sore  Pain Intervention(s) Limited activity within patient's tolerance;Repositioned;Ice applied  Cognition  Arousal/Alertness Awake/alert  Behavior During Therapy Flat affect;WFL for tasks assessed/performed  Overall Cognitive Status Within Functional Limits for tasks assessed  Upper Extremity Assessment  Upper Extremity Assessment Overall WFL for tasks assessed  Lower Extremity Assessment  Lower Extremity Assessment Defer to PT evaluation   Cervical / Trunk Assessment  Cervical / Trunk Assessment Normal  ADL  Overall ADL's  Needs assistance/impaired  Upper Body Bathing Set up;Sitting  Lower Body Bathing Minimal assistance;Sit to/from stand  Upper Body Dressing  Set up;Sitting  Lower Body Dressing Minimal assistance;Sit to/from Environmental education officer guard;RW  Functional mobility during ADLs Min guard;Rolling walker  General ADL Comments Educated pt on proper positioning of RLE in bed regarding terminal extension adn to avoid pillows under the knee; Wife will be able to assist with ADL tasks; wife concerned about tub transfers  Vision- Assessment  Additional Comments strabimus at baseline  Bed Mobility  Overal bed mobility Needs Assistance  Bed Mobility Supine to Sit  Supine to sit Min assist  General bed mobility comments assist to bring R LE to EOB   Transfers  Overall transfer level Needs assistance  Equipment used Rolling walker (2 wheeled)  Transfers Sit to/from Stand  Sit to Stand Min guard  General transfer comment cues for safe hand placement and technique; assist to power up into standing  Balance  Overall balance assessment Needs assistance  Sitting-balance support No upper extremity supported;Feet supported  Sitting balance-Leahy Scale Good  Standing balance support Bilateral upper extremity supported;During functional activity  Standing balance-Leahy Scale Poor  Standing balance comment Reliant on BUE support   OT - End of Session  Activity Tolerance Patient tolerated treatment well  Patient left in bed;with call bell/phone within reach;with SCD's reapplied  Nurse Communication Mobility status;Patient requests pain meds  CPM Right Knee  CPM Right Knee Off  OT Assessment  OT Recommendation/Assessment Patient needs continued OT Services  OT Visit Diagnosis Other abnormalities of gait and mobility (R26.89);Pain  Pain -  Right/Left Right  Pain - part of body Knee  OT Problem List Decreased range of  motion;Impaired balance (sitting and/or standing);Decreased safety awareness;Decreased knowledge of use of DME or AE;Decreased knowledge of precautions;Obesity;Pain  OT Plan  OT Frequency (ACUTE ONLY) Min 2X/week  OT Treatment/Interventions (ACUTE ONLY) Self-care/ADL training;DME and/or AE instruction;Therapeutic activities;Patient/family education  AM-PAC OT "6 Clicks" Daily Activity Outcome Measure  Help from another person eating meals? 4  Help from another person taking care of personal grooming? 4  Help from another person toileting, which includes using toliet, bedpan, or urinal? 3  Help from another person bathing (including washing, rinsing, drying)? 3  Help from another person to put on and taking off regular upper body clothing? 4  Help from another person to put on and taking off regular lower body clothing? 3  6 Click Score 21  ADL G Code Conversion CJ  OT Recommendation  Follow Up Recommendations No OT follow up;Supervision - Intermittent  OT Equipment 3 in 1 bedside commode  Individuals Consulted  Consulted and Agree with Results and Recommendations Patient;Family member/caregiver  Family Member Consulted wife  Acute Rehab OT Goals  Patient Stated Goal to get better  OT Goal Formulation With patient  Time For Goal Achievement 08/14/17  Potential to Achieve Goals Good  OT Time Calculation  OT Start Time (ACUTE ONLY) 1557  OT Stop Time (ACUTE ONLY) 1612  OT Time Calculation (min) 15 min  OT General Charges  $OT Visit 1 Visit  OT Evaluation  $OT Eval Low Complexity 1 Low  Written Expression  Dominant Hand Right  [facility], OT/L  [phone] 07/31/2017

## 2017-07-31 NOTE — Progress Notes (Signed)
Charge RN Roni called for pt "not alert" On my arrival pt sitting on bedside commode, drowsy, oriented x3, skin cool and clammy, follows commands, easily awakened with verbal stimuli but hard to keep awake. Pt had received oxycodone IR 5 mg po at 2132, he states he has never taken oxycodone before, normally takes tylenol and/or ibuprofen for pain control. Pt given Narcan 0.2 mg IVP with good response, alert and oriented. Assisted back to bed with 2 person assist. RN Tish at bedside, encouraged to call for any concerns.

## 2017-07-31 NOTE — Progress Notes (Signed)
Provider this am notified of pt refusing midnight acetaminophen and Toradol. Pt stated "I dont want to have another episode of how I felt after taking oxycodone".  Pt educated about the medication and still refused. Pt stated he was not in pain and would rather get some rest. Will continue to monitor.

## 2017-07-31 NOTE — Progress Notes (Addendum)
Called to pt bedside due to pt feeling nauseous. Upon arrival Charge RN at bedside stating that pt was not alert. Pt was extremely drowsy sitting up on side of bed being held up by wife. Pt A&Ox3 c/o dizziness/lightheaded and nausea.  Rapid Response called to assess pt.

## 2017-07-31 NOTE — Progress Notes (Signed)
Physical Therapy Treatment Patient Details Name: [name] MRN: [id number] DOB: [birth year] Today's Date: 07/31/2017    History of Present Illness Pt is a 62 y/o male s/p elective R TKA. PMH includes HTN.     PT Comments    Patient is making progress toward mobility goals. Complete HEP next session. Current plan remains appropriate.    Follow Up Recommendations  DC plan and follow up therapy as arranged by surgeon;Supervision for mobility/OOB     Equipment Recommendations  Rolling walker with 5" wheels;3in1 (PT)    Recommendations for Other Services       Precautions / Restrictions Precautions Precautions: Knee Precaution Comments: precautions reviewed with pt and wife Restrictions Weight Bearing Restrictions: Yes RLE Weight Bearing: Partial weight bearing RLE Partial Weight Bearing Percentage or Pounds: 50    Mobility  Bed Mobility Overal bed mobility: Needs Assistance Bed Mobility: Supine to Sit     Supine to sit: Min assist     General bed mobility comments: assist to bring R LE to EOB   Transfers Overall transfer level: Needs assistance Equipment used: Rolling walker (2 wheeled) Transfers: Sit to/from Stand Sit to Stand: Min assist         General transfer comment: cues for safe hand placement and technique; assist to power up into standing  Ambulation/Gait Ambulation/Gait assistance: Min guard Ambulation Distance (Feet): 100 Feet Assistive device: Rolling walker (2 wheeled) Gait Pattern/deviations: Decreased step length - left;Decreased weight shift to right;Antalgic;Step-through pattern;Decreased stance time - right;Decreased dorsiflexion - right Gait velocity: Decreased   General Gait Details: cues for posture, proximity to RW, and sequencing   Stairs            Wheelchair Mobility    Modified Rankin (Stroke Patients Only)       Balance Overall balance assessment: Needs assistance Sitting-balance support: No upper extremity  supported;Feet supported Sitting balance-Leahy Scale: Good     Standing balance support: Bilateral upper extremity supported;During functional activity Standing balance-Leahy Scale: Poor Standing balance comment: Reliant on BUE support                             Cognition Arousal/Alertness: Awake/alert Behavior During Therapy: Flat affect Overall Cognitive Status: Within Functional Limits for tasks assessed                                        Exercises Total Joint Exercises Ankle Circles/Pumps: AROM;Both;20 reps Quad Sets: AROM;Right;10 reps    General Comments General comments (skin integrity, edema, etc.): wife present      Pertinent Vitals/Pain Pain Assessment: Faces Faces Pain Scale: Hurts little more Pain Location: R knee  Pain Descriptors / Indicators: Aching;Sore Pain Intervention(s): Limited activity within patient's tolerance;Monitored during session;Premedicated before session;Repositioned    Home Living                      Prior Function            PT Goals (current goals can now be found in the care plan section) Acute Rehab PT Goals Patient Stated Goal: to get better PT Goal Formulation: With patient Time For Goal Achievement: 08/13/17 Potential to Achieve Goals: Good Progress towards PT goals: Progressing toward goals    Frequency    7X/week      PT Plan Current plan remains appropriate  Co-evaluation              AM-PAC PT "6 Clicks" Daily Activity  Outcome Measure  Difficulty turning over in bed (including adjusting bedclothes, sheets and blankets)?: A Little Difficulty moving from lying on back to sitting on the side of the bed? : Unable Difficulty sitting down on and standing up from a chair with arms (e.g., wheelchair, bedside commode, etc,.)?: Unable Help needed moving to and from a bed to chair (including a wheelchair)?: A Little Help needed walking in hospital room?: A Little Help  needed climbing 3-5 steps with a railing? : A Lot 6 Click Score: 13    End of Session Equipment Utilized During Treatment: Gait belt Activity Tolerance: Patient tolerated treatment well Patient left: in chair;with call bell/phone within reach;with family/visitor present Nurse Communication: Mobility status PT Visit Diagnosis: Other abnormalities of gait and mobility (R26.89);Pain Pain - Right/Left: Right Pain - part of body: Knee     Time: 2542-7062 PT Time Calculation (min) (ACUTE ONLY): 35 min  Charges:  $Gait Training: 8-22 mins $Therapeutic Activity: 8-22 mins                    G Codes:       Earney Navy, PTA Pager: [phone]     Darliss Cheney 07/31/2017, 10:00 AM

## 2017-08-01 LAB — BASIC METABOLIC PANEL
Anion gap: 9 (ref 5–15)
BUN: 6 mg/dL (ref 6–20)
CHLORIDE: 102 mmol/L (ref 101–111)
CO2: 25 mmol/L (ref 22–32)
CREATININE: 0.75 mg/dL (ref 0.61–1.24)
Calcium: 8.6 mg/dL — ABNORMAL LOW (ref 8.9–10.3)
GFR calc non Af Amer: >60 mL/min (ref >60)
GLUCOSE: 124 mg/dL — AB (ref 65–99)
Potassium: 3.4 mmol/L — ABNORMAL LOW (ref 3.5–5.1)
Sodium: 136 mmol/L (ref 135–145)

## 2017-08-01 LAB — CBC
HEMATOCRIT: 36.1 % — AB (ref 39.0–52.0)
HEMOGLOBIN: 11.6 g/dL — AB (ref 13.0–17.0)
MCH: 30.6 pg (ref 26.0–34.0)
MCHC: 32.1 g/dL (ref 30.0–36.0)
MCV: 95.3 fL (ref 78.0–100.0)
Platelets: 200 10*3/uL (ref 150–400)
RBC: 3.79 MIL/uL — ABNORMAL LOW (ref 4.22–5.81)
RDW: 13 % (ref 11.5–15.5)
WBC: 14 10*3/uL — ABNORMAL HIGH (ref 4.0–10.5)

## 2017-08-01 MED ORDER — RIVAROXABAN 10 MG PO TABS: 10.0000 mg | ORAL_TABLET | Freq: Every day | ORAL | 0 refills | Status: DC

## 2017-08-01 MED ORDER — TRAMADOL HCL 50 MG PO TABS: 50.0000 mg | ORAL_TABLET | Freq: Four times a day (QID) | ORAL | 0 refills | Status: DC | PRN

## 2017-08-01 MED ORDER — ACETAMINOPHEN 325 MG PO TABS: 650.0000 mg | ORAL_TABLET | ORAL | Status: AC | PRN

## 2017-08-01 NOTE — Progress Notes (Signed)
Occupational Therapy Treatment Patient Details Name: [name] MRN: [id number] DOB: 1956-05-14 Today's Date: 08/01/2017    History of present illness Pt is a 62 y/o male s/p elective R TKA. PMH includes HTN.    OT comments  Focus of session was education on tub shower transfer. Pt declined transfers/mobility citing fatigue. Did observe pt walking with PT in the hall earlier. Spouse present and educated on ADL strategies and encouraged to promote functional independence during recovery. D/c plan remains appropriate.   Follow Up Recommendations  No OT follow up;Supervision - Intermittent    Equipment Recommendations  3 in 1 bedside commode    Recommendations for Other Services      Precautions / Restrictions Precautions Precautions: Knee Precaution Comments: precautions reviewed with pt  Restrictions Weight Bearing Restrictions: Yes RLE Weight Bearing: Partial weight bearing RLE Partial Weight Bearing Percentage or Pounds: 50       Mobility Bed Mobility               General bed mobility comments: declined  Transfers                 General transfer comment: declined    Balance               Standing balance comment: declined                           ADL either performed or assessed with clinical judgement   ADL Overall ADL's : Needs assistance/impaired                                       General ADL Comments: Pt declined OOB citing fatigue. Educated pt and spouse on tub shower transfer using shower seat and strategies for PWB. Advised to sponge bath until pt can maintain PWB during tub transfer. Pt seems to defer answering questions and education to spouse. Spouse asking if pt would be physically able to bathe himself. Encouraged pt and spouse in promoting functional independence with ADLs. Did observe pt walking in the hall with PT earlier.      Vision   Additional Comments: strabimus at baseline    Perception     Praxis      Cognition Arousal/Alertness: Awake/alert Behavior During Therapy: Flat affect Overall Cognitive Status: Within Functional Limits for tasks assessed                                          Exercises     Shoulder Instructions       General Comments      Pertinent Vitals/ Pain       Pain Assessment: Faces Faces Pain Scale: Hurts little more Pain Location: R knee  Pain Descriptors / Indicators: Aching;Sore Pain Intervention(s): Monitored during session  Home Living                                          Prior Functioning/Environment              Frequency  Min 2X/week        Progress Toward Goals  OT Goals(current goals can now be found in  the care plan section)  Progress towards OT goals: Progressing toward goals  Acute Rehab OT Goals Patient Stated Goal: to get better OT Goal Formulation: With patient Time For Goal Achievement: 08/14/17 Potential to Achieve Goals: Good ADL Goals Pt Will Perform Tub/Shower Transfer: Tub transfer;with supervision;3 in 1;ambulating;rolling walker;with caregiver independent in assisting Additional ADL Goal #1: Pt/wife will independently verbalize 3 strateiges to reduce risk of falls  Plan Discharge plan remains appropriate    Co-evaluation                 AM-PAC PT "6 Clicks" Daily Activity     Outcome Measure   Help from another person eating meals?: None Help from another person taking care of personal grooming?: None Help from another person toileting, which includes using toliet, bedpan, or urinal?: A Little Help from another person bathing (including washing, rinsing, drying)?: A Little Help from another person to put on and taking off regular upper body clothing?: None Help from another person to put on and taking off regular lower body clothing?: A Little 6 Click Score: 21    End of Session    OT Visit Diagnosis: Other abnormalities of  gait and mobility (R26.89);Pain Pain - Right/Left: Right Pain - part of body: Knee   Activity Tolerance     Patient Left in bed;with call bell/phone within reach;with family/visitor present   Nurse Communication          Time: 8675-4492 OT Time Calculation (min): 15 min  Charges: OT General Charges $OT Visit: 1 Visit OT Treatments $Self Care/Home Management : 8-22 mins     Hortencia Pilar 08/01/2017, 1:12 PM

## 2017-08-01 NOTE — Plan of Care (Signed)
  Adequate for Discharge Education: Knowledge of General Education information will improve 08/01/2017 1235 - Adequate for Discharge by Rance Muir, RN 08/01/2017 1125 - Progressing by Rance Muir, RN Health Behavior/Discharge Planning: Ability to manage health-related needs will improve 08/01/2017 1235 - Adequate for Discharge by Rance Muir, RN 08/01/2017 1125 - Progressing by Rance Muir, RN Clinical Measurements: Ability to maintain clinical measurements within normal limits will improve 08/01/2017 1235 - Adequate for Discharge by Rance Muir, RN 08/01/2017 1125 - Progressing by Rance Muir, RN Will remain free from infection 08/01/2017 1235 - Adequate for Discharge by Rance Muir, RN 08/01/2017 1125 - Progressing by Rance Muir, RN Diagnostic test results will improve 08/01/2017 1235 - Adequate for Discharge by Rance Muir, RN 08/01/2017 1125 - Progressing by Rance Muir, RN Respiratory complications will improve 08/01/2017 1235 - Adequate for Discharge by Rance Muir, RN 08/01/2017 1125 - Progressing by Rance Muir, RN Cardiovascular complication will be avoided 08/01/2017 1235 - Adequate for Discharge by Rance Muir, RN 08/01/2017 1125 - Progressing by Rance Muir, RN Activity: Risk for activity intolerance will decrease 08/01/2017 1235 - Adequate for Discharge by Rance Muir, RN 08/01/2017 1125 - Progressing by Rance Muir, RN Nutrition: Adequate nutrition will be maintained 08/01/2017 1235 - Adequate for Discharge by Rance Muir, RN 08/01/2017 1125 - Progressing by Rance Muir, RN Coping: Level of anxiety will decrease 08/01/2017 1235 - Adequate for Discharge by Rance Muir, RN 08/01/2017 1125 - Progressing by Rance Muir, RN Elimination: Will not experience complications related to bowel motility 08/01/2017 1235 - Adequate for Discharge by Rance Muir, RN 08/01/2017 1125 - Progressing by Rance Muir, RN Will not experience complications related to urinary retention 08/01/2017 1235 - Adequate for Discharge by Rance Muir,  RN 08/01/2017 1125 - Progressing by Rance Muir, RN Pain Managment: General experience of comfort will improve 08/01/2017 1235 - Adequate for Discharge by Rance Muir, RN 08/01/2017 1125 - Progressing by Rance Muir, RN Safety: Ability to remain free from injury will improve 08/01/2017 1235 - Adequate for Discharge by Rance Muir, RN 08/01/2017 1125 - Progressing by Rance Muir, RN Skin Integrity: Risk for impaired skin integrity will decrease 08/01/2017 1235 - Adequate for Discharge by Rance Muir, RN 08/01/2017 1125 - Progressing by Rance Muir, RN

## 2017-08-01 NOTE — Discharge Summary (Signed)
Joni Fears, MD   Biagio Borg, PA-C [address] [place], [place]  [address]                             [phone]  PATIENT ID: [name]        MRN:  [id number]          DOB/AGE: [birth year] / 62 y.o.    DISCHARGE SUMMARY  ADMISSION DATE:    07/30/2017 DISCHARGE DATE:   08/01/2017   ADMISSION DIAGNOSIS: Osteoarthritis Right Knee    DISCHARGE DIAGNOSIS:  Osteoarthritis Right Knee    ADDITIONAL DIAGNOSIS: Principal Problem:   Primary osteoarthritis of right knee Active Problems:   S/P TKR (total knee replacement) using cement, right  Past Medical History:  Diagnosis Date  . Arthritis    "right knee" (07/30/2017)  . CAP (community acquired pneumonia) 2018  . Hypertension   . Hypertension   . Wears glasses     PROCEDURE: Procedure(s): RIGHT TOTAL KNEE ARTHROPLASTY  on 07/30/2017  CONSULTS: none    HISTORY: [name], 62 y.o. male, has a history of pain and functional disability in the right knee due to arthritis and has failed non-surgical conservative treatments for greater than 12 weeks to includeNSAID's and/or analgesics, corticosteriod injections, flexibility and strengthening excercises, weight reduction as appropriate and activity modification.  Onset of symptoms was gradual, starting 5 years ago with gradually worsening course since that time. The patient noted no past surgery on the right knee(s).  Patient currently rates pain in the right knee(s) at 6 out of 10 with activity. Patient has night pain, worsening of pain with activity and weight bearing, pain that interferes with activities of daily living, crepitus and joint swelling.  Patient has evidence of subchondral sclerosis, periarticular osteophytes, joint subluxation and joint space narrowing by imaging studies.   HOSPITAL COURSE:  MARSHEL GOLUBSKI is a 62 y.o. admitted on 07/30/2017 and found to have a diagnosis of Osteoarthritis Right Knee.  After appropriate laboratory studies were  obtained  they were taken to the operating room on 07/30/2017 and underwent  Procedure(s): RIGHT TOTAL KNEE ARTHROPLASTY  .   They were given perioperative antibiotics:  Anti-infectives (From admission, onward)   Start     Dose/Rate Route Frequency Ordered Stop   07/30/17 1215  ceFAZolin (ANCEF) IVPB 2g/100 mL premix     2 g 200 mL/hr over 30 Minutes Intravenous Every 6 hours 07/30/17 1201 07/30/17 1840   07/30/17 1200  ceFAZolin (ANCEF) 2-4 GM/100ML-% IVPB    Comments:  [facility]   : cabinet override      07/30/17 1200 07/31/17 0014   07/30/17 0600  ceFAZolin (ANCEF) IVPB 2g/100 mL premix     2 g 200 mL/hr over 30 Minutes Intravenous To [facility] 07/29/17 1001 07/30/17 0726    .  Tolerated the procedure well.  Unable to place a foley intraoperatively.  Post op straight cath performed.  Toradol was given post op.  POD #1, allowed out of bed to a chair.  PT for ambulation and exercise program.  Had temperature elevation but improved to normal  IV saline locked.  O2 discontionued.  POD #2, continued PT and ambulation.  Dressing changed.  The remainder of the hospital course was dedicated to ambulation and strengthening.   The patient was discharged on 2 Days Post-Op in  Stable condition.  Blood products given:none  DIAGNOSTIC STUDIES: Recent vital signs:  Patient  Vitals for the past 24 hrs:  BP Temp Temp src Pulse Resp SpO2  08/01/17 0807 - 99 F (37.2 C) - - - -  08/01/17 0644 - 98.6 F (37 C) Oral - - -  08/01/17 0511 131/63 (!) 100.7 F (38.2 C) Oral 73 16 98 %  08/01/17 0035 - 99.4 F (37.4 C) Oral - - -  07/31/17 2019 (!) 144/66 (!) 101.8 F (38.8 C) Oral 72 16 95 %  07/31/17 1500 (!) 143/60 99.1 F (37.3 C) Oral 66 16 100 %       Recent laboratory studies: Recent Labs    07/31/17 0632 08/01/17 0734  WBC 11.3* 14.0*  HGB 11.6* 11.6*  HCT 35.6* 36.1*  PLT 207 200   Recent Labs    07/31/17 0632 08/01/17 0734  NA 134* 136  K 3.5 3.4*  CL 102  102  CO2 22 25  BUN 8 6  CREATININE 0.81 0.75  GLUCOSE 141* 124*  CALCIUM 8.3* 8.6*   Lab Results  Component Value Date   INR 1.06 07/23/2017     Recent Radiographic Studies :  Dg Chest 2 View  Result Date: 07/23/2017 CLINICAL DATA:  Preop for right total knee replacement EXAM: CHEST  2 VIEW COMPARISON:  Chest x-ray of 03/17/2017 FINDINGS: No active infiltrate or effusion is seen. Mediastinal and hilar contours are unremarkable. The heart is within upper limits of normal. There are mild degenerative changes in the mid to lower thoracic spine. IMPRESSION: No active cardiopulmonary disease. Electronically Signed   By: Ivar Drape M.D.   On: 07/23/2017 15:04    DISCHARGE INSTRUCTIONS: Discharge Instructions    CPM   Complete by:  As directed    Continuous passive motion machine (CPM):      Use the CPM from 0 to 60 for 6-8 hours per day.      You may increase by 5-10 degrees per day.  You may break it up into 2 or 3 sessions per day.      Use CPM for 3-4  weeks or until you are told to stop.   Call MD / Call 911   Complete by:  As directed    If you experience chest pain or shortness of breath, CALL 911 and be transported to the hospital emergency room.  If you develope a fever above 101 F, pus (white drainage) or increased drainage or redness at the wound, or calf pain, call your surgeon's office.   Change dressing   Complete by:  As directed    DO NOT CHANGE YOUR DRESSING   Constipation Prevention   Complete by:  As directed    Drink plenty of fluids.  Prune juice may be helpful.  You may use a stool softener, such as Colace (over the counter) 100 mg twice a day.  Use MiraLax (over the counter) for constipation as needed.   Diet general   Complete by:  As directed    Discharge instructions   Complete by:  As directed    Rodney items at home which could result in a fall. This includes throw rugs or furniture in walking pathways ICE to the  affected joint every three hours while awake for 30 minutes at a time, for at least the first 3-5 days, and then as needed for pain and swelling.  Continue to use ice for pain and swelling. You may notice swelling that will progress down to the foot and ankle.  This is normal after surgery.  Elevate your leg when you are not up walking on it.   Continue to use the breathing machine you got in the hospital (incentive spirometer) which will help keep your temperature down.  It is common for your temperature to cycle up and down following surgery, especially at night when you are not up moving around and exerting yourself.  The breathing machine keeps your lungs expanded and your temperature down.   DIET:  As you were doing prior to hospitalization, we recommend a well-balanced diet.  DRESSING / WOUND CARE / SHOWERING  Keep the surgical dressing until follow up.  The dressing is water proof, so you can shower without any extra covering.  IF THE DRESSING FALLS OFF or the wound gets wet inside, change the dressing with sterile gauze.  Please use good hand washing techniques before changing the dressing.  Do not use any lotions or creams on the incision until instructed by your surgeon.    ACTIVITY  Increase activity slowly as tolerated, but follow the weight bearing instructions below.   No driving for 6 weeks or until further direction given by your physician.  You cannot drive while taking narcotics.  No lifting or carrying greater than 10 lbs. until further directed by your surgeon. Avoid periods of inactivity such as sitting longer than an hour when not asleep. This helps prevent blood clots.  You may return to work once you are authorized by your doctor.     WEIGHT BEARING   Partial weight bearing with assist device as directed.  50%   EXERCISES  Results after joint replacement surgery are often greatly improved when you follow the exercise, range of motion and muscle strengthening  exercises prescribed by your doctor. Safety measures are also important to protect the joint from further injury. Any time any of these exercises cause you to have increased pain or swelling, decrease what you are doing until you are comfortable again and then slowly increase them. If you have problems or questions, call your caregiver or physical therapist for advice.   Rehabilitation is important following a joint replacement. After just a few days of immobilization, the muscles of the leg can become weakened and shrink (atrophy).  These exercises are designed to build up the tone and strength of the thigh and leg muscles and to improve motion. Often times heat used for twenty to thirty minutes before working out will loosen up your tissues and help with improving the range of motion but do not use heat for the first two weeks following surgery (sometimes heat can increase post-operative swelling).   These exercises can be done on a training (exercise) mat, on the floor, on a table or on a bed. Use whatever works the best and is most comfortable for you.    Use music or television while you are exercising so that the exercises are a pleasant break in your day. This will make your life better with the exercises acting as a break in your routine that you can look forward to.   Perform all exercises about fifteen times, three times per day or as directed.  You should exercise both the operative leg and the other leg as well.   Exercises include:  Quad Sets - Tighten up the muscle on the front of the thigh (Quad) and hold for 5-10 seconds.   Straight Leg Raises - With your knee straight (if you were given a brace, keep it on), lift the  leg to 60 degrees, hold for 3 seconds, and slowly lower the leg.  Perform this exercise against resistance later as your leg gets stronger.  Leg Slides: Lying on your back, slowly slide your foot toward your buttocks, bending your knee up off the floor (only go as far as is  comfortable). Then slowly slide your foot back down until your leg is flat on the floor again.  Angel Wings: Lying on your back spread your legs to the side as far apart as you can without causing discomfort.  Hamstring Strength:  Lying on your back, push your heel against the floor with your leg straight by tightening up the muscles of your buttocks.  Repeat, but this time bend your knee to a comfortable angle, and push your heel against the floor.  You may put a pillow under the heel to make it more comfortable if necessary.   A rehabilitation program following joint replacement surgery can speed recovery and prevent re-injury in the future due to weakened muscles. Contact your doctor or a physical therapist for more information on knee rehabilitation.    CONSTIPATION  Constipation is defined medically as fewer than three stools per week and severe constipation as less than one stool per week.  Even if you have a regular bowel pattern at home, your normal regimen is likely to be disrupted due to multiple reasons following surgery.  Combination of anesthesia, postoperative narcotics, change in appetite and fluid intake all can affect your bowels.   YOU MUST use at least one of the following options; they are listed in order of increasing strength to get the job done.  They are all available over the counter, and you may need to use some, POSSIBLY even all of these options:    Drink plenty of fluids (prune juice may be helpful) and high fiber foods Colace 100 mg by mouth twice a day  Senokot for constipation as directed and as needed Dulcolax (bisacodyl), take with full glass of water  Miralax (polyethylene glycol) once or twice a day as needed.  If you have tried all these things and are unable to have a bowel movement in the first 3-4 days after surgery call either your surgeon or your primary doctor.    If you experience loose stools or diarrhea, hold the medications until you stool forms back  up.  If your symptoms do not get better within 1 week or if they get worse, check with your doctor.  If you experience "the worst abdominal pain ever" or develop nausea or vomiting, please contact the office immediately for further recommendations for treatment.   ITCHING:  If you experience itching with your medications, try taking only a single pain pill, or even half a pain pill at a time.  You can also use Benadryl over the counter for itching or also to help with sleep.   TED HOSE STOCKINGS:  Use stockings on both legs until for at least 2 weeks or as directed by physician office. They may be removed at night for sleeping.  MEDICATIONS:  See your medication summary on the "After Visit Summary" that nursing will review with you.  You may have some home medications which will be placed on hold until you complete the course of blood thinner medication.  It is important for you to complete the blood thinner medication as prescribed.  PRECAUTIONS:  If you experience chest pain or shortness of breath - call 911 immediately for transfer to the hospital emergency  department.   If you develop a fever greater that 101 F, purulent drainage from wound, increased redness or drainage from wound, foul odor from the wound/dressing, or calf pain - CONTACT YOUR SURGEON.                                                   FOLLOW-UP APPOINTMENTS:  If you do not already have a post-op appointment, please call the office for an appointment to be seen by your surgeon.  Guidelines for how soon to be seen are listed in your "After Visit Summary", but are typically between 1-4 weeks after surgery.  OTHER INSTRUCTIONS:   Knee Replacement:  Do not place pillow under knee, focus on keeping the knee straight while resting. CPM instructions: 0-90 degrees, 2 hours in the morning, 2 hours in the afternoon, and 2 hours in the evening. Place foam block, curve side up under heel at all times except when in CPM or when walking.  DO  NOT modify, tear, cut, or change the foam block in any way.  MAKE SURE YOU:  Understand these instructions.  Get help right away if you are not doing well or get worse.    Thank you for letting us be a part of your medical care team.  It is a privilege we respect greatly.  We hope these instructions will help you stay on track for a fast and full recovery!   Do not put a pillow under the knee. Place it under the heel.   Complete by:  As directed    Driving restrictions   Complete by:  As directed    No driving for 6 weeks   Increase activity slowly as tolerated   Complete by:  As directed    Lifting restrictions   Complete by:  As directed    No lifting for 6 weeks   Partial weight bearing   Complete by:  As directed    % Body Weight:  50%   Laterality:  right   Extremity:  Lower   Patient may shower   Complete by:  As directed    You may shower over the brown dressing   TED hose   Complete by:  As directed    Use stockings (TED hose) for 2-3 weeks on right leg.  You may remove them at night for sleeping.      DISCHARGE MEDICATIONS:   Allergies as of 08/01/2017      Reactions   Flexeril [cyclobenzaprine] Shortness Of Breath   Can not take any muscle relaxer causes respirator depression   Robaxin [methocarbamol] Shortness Of Breath   Can not take any muscle relaxer causes respirator depression      Medication List    STOP taking these medications   aspirin EC 81 MG tablet   HYDROcodone-homatropine 5-1.5 MG/5ML syrup Commonly known as:  HYCODAN   ibuprofen 200 MG tablet Commonly known as:  ADVIL,MOTRIN   loperamide 2 MG tablet Commonly known as:  IMODIUM A-D   methocarbamol 500 MG tablet Commonly known as:  ROBAXIN   promethazine 25 MG tablet Commonly known as:  PHENERGAN     TAKE these medications   acetaminophen 325 MG tablet Commonly known as:  TYLENOL Take 2 tablets (650 mg total) by mouth every 4 (four) hours as needed. What changed:  medication  strength  how much to take  when to take this  reasons to take this   losartan-hydrochlorothiazide 50-12.5 MG tablet Commonly known as:  HYZAAR Take 1 tablet by mouth daily.   rivaroxaban 10 MG Tabs tablet Commonly known as:  XARELTO Take 1 tablet (10 mg total) by mouth daily with breakfast. Start taking on:  08/02/2017   traMADol 50 MG tablet Commonly known as:  ULTRAM Take 1-2 tablets (50-100 mg total) by mouth every 6 (six) hours as needed (as needed).            Durable Medical Equipment  (From admission, onward)        Start     Ordered   07/30/17 1521  DME Walker rolling  Once    Question:  Patient needs a walker to treat with the following condition  Answer:  S/P total knee replacement using cement, left   07/30/17 1521   07/30/17 1521  DME 3 n 1  Once     07/30/17 1521   07/30/17 1521  DME Bedside commode  Once    Question:  Patient needs a bedside commode to treat with the following condition  Answer:  Post-operative state   07/30/17 1521       Discharge Care Instructions  (From admission, onward)        Start     Ordered   08/01/17 0000  Partial weight bearing    Question Answer Comment  % Body Weight 50%   Laterality right   Extremity Lower      08/01/17 0932   08/01/17 0000  Change dressing    Comments:  DO NOT CHANGE YOUR DRESSING   08/01/17 0932      FOLLOW UP VISIT:   Follow-up Information    [facility] up.   Specialty:  [place] Why:  A representative from [facility] will contact you to arrange start date and timefor your therapy. Contact information: [address] [place] [address] [phone]        [name], MD Follow up on 08/14/2017.   Specialty:  Orthopedic Surgery Contact information: [address] [address] [phone]           DISPOSITION:   Home  CONDITION:  Stable   Mike Craze. [place], [place] [phone]  08/01/2017 9:36 AM

## 2017-08-01 NOTE — Plan of Care (Signed)
  Progressing Education: Knowledge of General Education information will improve 08/01/2017 1125 - Progressing by Rance Muir, RN Health Behavior/Discharge Planning: Ability to manage health-related needs will improve 08/01/2017 1125 - Progressing by Rance Muir, RN Clinical Measurements: Ability to maintain clinical measurements within normal limits will improve 08/01/2017 1125 - Progressing by Rance Muir, RN Will remain free from infection 08/01/2017 1125 - Progressing by Rance Muir, RN Diagnostic test results will improve 08/01/2017 1125 - Progressing by Rance Muir, RN Respiratory complications will improve 08/01/2017 1125 - Progressing by Rance Muir, RN Cardiovascular complication will be avoided 08/01/2017 1125 - Progressing by Rance Muir, RN Activity: Risk for activity intolerance will decrease 08/01/2017 1125 - Progressing by Rance Muir, RN Nutrition: Adequate nutrition will be maintained 08/01/2017 1125 - Progressing by Rance Muir, RN Coping: Level of anxiety will decrease 08/01/2017 1125 - Progressing by Rance Muir, RN Elimination: Will not experience complications related to bowel motility 08/01/2017 1125 - Progressing by Rance Muir, RN Will not experience complications related to urinary retention 08/01/2017 1125 - Progressing by Rance Muir, RN Pain Managment: General experience of comfort will improve 08/01/2017 1125 - Progressing by Rance Muir, RN Safety: Ability to remain free from injury will improve 08/01/2017 1125 - Progressing by Rance Muir, RN Skin Integrity: Risk for impaired skin integrity will decrease 08/01/2017 1125 - Progressing by Rance Muir, RN

## 2017-08-01 NOTE — Progress Notes (Signed)
Physical Therapy Treatment Patient Details Name: [name] MRN: [id number] DOB: 1956-01-03 Today's Date: 08/01/2017    History of Present Illness Pt is a 62 y/o male s/p elective R TKA. PMH includes HTN.     PT Comments    Patient tolerated ambulating 263ft and stair training this session. Current plan remains appropriate.    Follow Up Recommendations  DC plan and follow up therapy as arranged by surgeon;Supervision for mobility/OOB     Equipment Recommendations  Rolling walker with 5" wheels;3in1 (PT)    Recommendations for Other Services       Precautions / Restrictions Precautions Precautions: Knee Precaution Comments: precautions reviewed with pt  Restrictions Weight Bearing Restrictions: Yes RLE Weight Bearing: Partial weight bearing RLE Partial Weight Bearing Percentage or Pounds: 50    Mobility  Bed Mobility Overal bed mobility: Needs Assistance Bed Mobility: Supine to Sit     Supine to sit: Min assist     General bed mobility comments: wife assisted to bring R LE to EOB  Transfers Overall transfer level: Needs assistance Equipment used: Rolling walker (2 wheeled) Transfers: Sit to/from Stand Sit to Stand: Min guard         General transfer comment: cues for safe hand placement from EOB and recliner  Ambulation/Gait Ambulation/Gait assistance: Min guard;Supervision Ambulation Distance (Feet): 250 Feet Assistive device: Rolling walker (2 wheeled) Gait Pattern/deviations: Step-through pattern;Decreased stance time - right;Decreased step length - left Gait velocity: Decreased   General Gait Details: cues for proximity to RW and posture   Stairs Stairs: Yes   Stair Management: One rail Left;Step to pattern;Forwards Number of Stairs: 3 General stair comments: cues for sequencing and technique; min guard to ascend and min A to descend  Wheelchair Mobility    Modified Rankin (Stroke Patients Only)       Balance                Standing balance comment: declined                            Cognition Arousal/Alertness: Awake/alert Behavior During Therapy: Flat affect Overall Cognitive Status: Within Functional Limits for tasks assessed                                        Exercises      General Comments        Pertinent Vitals/Pain Pain Assessment: Faces Faces Pain Scale: Hurts little more Pain Location: R knee  Pain Descriptors / Indicators: Aching;Sore Pain Intervention(s): Limited activity within patient's tolerance;Monitored during session;Premedicated before session;Repositioned    Home Living                      Prior Function            PT Goals (current goals can now be found in the care plan section) Acute Rehab PT Goals Patient Stated Goal: to get better Progress towards PT goals: Progressing toward goals    Frequency    7X/week      PT Plan Current plan remains appropriate    Co-evaluation              AM-PAC PT "6 Clicks" Daily Activity  Outcome Measure  Difficulty turning over in bed (including adjusting bedclothes, sheets and blankets)?: A Little Difficulty moving from lying on back to  sitting on the side of the bed? : Unable Difficulty sitting down on and standing up from a chair with arms (e.g., wheelchair, bedside commode, etc,.)?: Unable Help needed moving to and from a bed to chair (including a wheelchair)?: A Little Help needed walking in hospital room?: A Little Help needed climbing 3-5 steps with a railing? : A Little 6 Click Score: 14    End of Session Equipment Utilized During Treatment: Gait belt Activity Tolerance: Patient tolerated treatment well Patient left: with call bell/phone within reach;in bed;with family/visitor present Nurse Communication: Mobility status PT Visit Diagnosis: Other abnormalities of gait and mobility (R26.89);Pain Pain - Right/Left: Right Pain - part of body: Knee     Time:  [id number] PT Time Calculation (min) (ACUTE ONLY): 45 min  Charges:  $Gait Training: 23-37 mins $Therapeutic Activity: 8-22 mins                    G Codes:       Earney Navy, PTA Pager: [phone]     Darliss Cheney 08/01/2017, 1:42 PM

## 2017-08-01 NOTE — Progress Notes (Signed)
PATIENT ID: [name]        MRN:  [id number]          DOB/AGE: 10-14-1955 / 62 y.o.    Joni Fears, MD   Biagio Borg, PA-C [address] [place], [place]  [address]                             [phone]   PROGRESS NOTE  Subjective:  negative for Chest Pain  negative for Shortness of Breath  negative for Nausea/Vomiting   negative for Calf Pain    Tolerating Diet: yes         Patient reports pain as mild.     Much better today-tramadol effective  Objective: Vital signs in last 24 hours:    Patient Vitals for the past 24 hrs:  BP Temp Temp src Pulse Resp SpO2  08/01/17 0807 - 99 F (37.2 C) - - - -  08/01/17 0644 - 98.6 F (37 C) Oral - - -  08/01/17 0511 131/63 (!) 100.7 F (38.2 C) Oral 73 16 98 %  08/01/17 0035 - 99.4 F (37.4 C) Oral - - -  07/31/17 2019 (!) 144/66 (!) 101.8 F (38.8 C) Oral 72 16 95 %  07/31/17 1500 (!) 143/60 99.1 F (37.3 C) Oral 66 16 100 %      Intake/Output from previous day:   01/30 0701 - 01/31 0700 In: 380 [P.O.:380] Out: -    Intake/Output this shift:   No intake/output data recorded.   Intake/Output      01/30 0701 - 01/31 0700 01/31 0701 - 02/01 0700   P.O. 380    I.V.     Other     IV Piggyback     Total Intake 380    Urine     Blood     Total Output     Net +380         Urine Occurrence 4 x 1 x      LABORATORY DATA: Recent Labs    07/31/17 0632 08/01/17 0734  WBC 11.3* 14.0*  HGB 11.6* 11.6*  HCT 35.6* 36.1*  PLT 207 200   Recent Labs    07/31/17 0632 08/01/17 0734  NA 134* 136  K 3.5 3.4*  CL 102 102  CO2 22 25  BUN 8 6  CREATININE 0.81 0.75  GLUCOSE 141* 124*  CALCIUM 8.3* 8.6*   Lab Results  Component Value Date   INR 1.06 07/23/2017    Recent Radiographic Studies :  Dg Chest 2 View  Result Date: 07/23/2017 CLINICAL DATA:  Preop for right total knee replacement EXAM: CHEST  2 VIEW COMPARISON:  Chest x-ray of 03/17/2017 FINDINGS: No active infiltrate or effusion is seen.  Mediastinal and hilar contours are unremarkable. The heart is within upper limits of normal. There are mild degenerative changes in the mid to lower thoracic spine. IMPRESSION: No active cardiopulmonary disease. Electronically Signed   By: Ivar Drape M.D.   On: 07/23/2017 15:04     Examination:  General appearance: alert, cooperative and no distress  Wound Exam: clean, dry, intact   Drainage:  None: wound tissue dry  Motor Exam: EHL, FHL, Anterior Tibial and Posterior Tibial Intact  Sensory Exam: Superficial Peroneal, Deep Peroneal and Tibial normal  Vascular Exam: Normal  Assessment:    2 Days Post-Op  Procedure(s) (LRB): RIGHT TOTAL KNEE ARTHROPLASTY (Right)  ADDITIONAL DIAGNOSIS:  Principal Problem:  Primary osteoarthritis of right knee Active Problems:   S/P TKR (total knee replacement) using cement, right     Plan: Physical Therapy as ordered Partial Weight Bearing @ 50% (PWB)  DVT Prophylaxis:  Xarelto, Foot Pumps and TED hose  DISCHARGE PLAN: Home  DISCHARGE NEEDS: HHPT, CPM, Walker and 3-in-1 comode seat Feeling better today-no problems with tramadol. Blood in urine has cleared according to pt. No SOB or chest pain, Temp elevation last night-afebrile this am. No obvious source of temp this am. Dressing changed right knee this am and wound clean and dry, No calf pain.Will discharge this am       Biagio Borg, Hershal Coria [place]  08/01/2017 9:03 AM  Patient ID: [name], male   DOB: [birth year], 62 y.o.   MRN: [id number]

## 2017-08-02 ENCOUNTER — Telehealth (INDEPENDENT_AMBULATORY_CARE_PROVIDER_SITE_OTHER): Payer: Self-pay | Admitting: Orthopaedic Surgery

## 2017-08-02 NOTE — Telephone Encounter (Signed)
Verdis Frederickson, physical therapist at [place] [facility]  called for verbal orders for PT for the patient.   Please call her back at  [phone]

## 2017-08-05 ENCOUNTER — Encounter (HOSPITAL_COMMUNITY): Payer: Self-pay | Admitting: Emergency Medicine

## 2017-08-05 ENCOUNTER — Encounter (INDEPENDENT_AMBULATORY_CARE_PROVIDER_SITE_OTHER): Payer: Self-pay | Admitting: Orthopaedic Surgery

## 2017-08-05 ENCOUNTER — Inpatient Hospital Stay (INDEPENDENT_AMBULATORY_CARE_PROVIDER_SITE_OTHER): Payer: BLUE CROSS/BLUE SHIELD | Admitting: Orthopaedic Surgery

## 2017-08-05 ENCOUNTER — Ambulatory Visit (INDEPENDENT_AMBULATORY_CARE_PROVIDER_SITE_OTHER): Payer: BLUE CROSS/BLUE SHIELD | Admitting: Orthopaedic Surgery

## 2017-08-05 ENCOUNTER — Emergency Department (HOSPITAL_COMMUNITY)
Admission: EM | Admit: 2017-08-05 | Discharge: 2017-08-05 | Disposition: A | Discharge status: Home / Self Care | Payer: BLUE CROSS/BLUE SHIELD | Attending: Emergency Medicine | Admitting: Emergency Medicine

## 2017-08-05 VITALS — BP 127/81 | HR 90 | Resp 14 | Ht 70.0 in | Wt 250.0 lb

## 2017-08-05 DIAGNOSIS — Z96651 Presence of right artificial knee joint: Secondary | ICD-10-CM | POA: Diagnosis not present

## 2017-08-05 DIAGNOSIS — I10 Essential (primary) hypertension: Secondary | ICD-10-CM | POA: Insufficient documentation

## 2017-08-05 DIAGNOSIS — M25461 Effusion, right knee: Secondary | ICD-10-CM | POA: Diagnosis not present

## 2017-08-05 DIAGNOSIS — R509 Fever, unspecified: Secondary | ICD-10-CM | POA: Diagnosis present

## 2017-08-05 DIAGNOSIS — Z79899 Other long term (current) drug therapy: Secondary | ICD-10-CM | POA: Insufficient documentation

## 2017-08-05 DIAGNOSIS — M25561 Pain in right knee: Secondary | ICD-10-CM | POA: Diagnosis not present

## 2017-08-05 LAB — CBC WITH DIFFERENTIAL/PLATELET
Basophils Absolute: 0 10*3/uL (ref 0.0–0.1)
Basophils Relative: 0 %
Eosinophils Absolute: 0 10*3/uL (ref 0.0–0.7)
Eosinophils Relative: 0 %
HEMATOCRIT: 34.1 % — AB (ref 39.0–52.0)
HEMOGLOBIN: 10.8 g/dL — AB (ref 13.0–17.0)
LYMPHS PCT: 9 %
Lymphs Abs: 1.3 10*3/uL (ref 0.7–4.0)
MCH: 30.3 pg (ref 26.0–34.0)
MCHC: 31.7 g/dL (ref 30.0–36.0)
MCV: 95.8 fL (ref 78.0–100.0)
MONO ABS: 1.5 10*3/uL — AB (ref 0.1–1.0)
MONOS PCT: 10 %
NEUTROS ABS: 12 10*3/uL — AB (ref 1.7–7.7)
NEUTROS PCT: 81 %
Platelets: 337 10*3/uL (ref 150–400)
RBC: 3.56 MIL/uL — ABNORMAL LOW (ref 4.22–5.81)
RDW: 12.5 % (ref 11.5–15.5)
WBC: 14.9 10*3/uL — ABNORMAL HIGH (ref 4.0–10.5)

## 2017-08-05 LAB — COMPREHENSIVE METABOLIC PANEL
ALBUMIN: 3 g/dL — AB (ref 3.5–5.0)
ALT: 41 U/L (ref 17–63)
ANION GAP: 12 (ref 5–15)
AST: 33 U/L (ref 15–41)
Alkaline Phosphatase: 68 U/L (ref 38–126)
BUN: 11 mg/dL (ref 6–20)
CO2: 28 mmol/L (ref 22–32)
Calcium: 8.5 mg/dL — ABNORMAL LOW (ref 8.9–10.3)
Chloride: 95 mmol/L — ABNORMAL LOW (ref 101–111)
Creatinine, Ser: 0.74 mg/dL (ref 0.61–1.24)
GFR calc non Af Amer: >60 mL/min (ref >60)
GLUCOSE: 165 mg/dL — AB (ref 65–99)
POTASSIUM: 3.1 mmol/L — AB (ref 3.5–5.1)
Sodium: 135 mmol/L (ref 135–145)
Total Bilirubin: 0.9 mg/dL (ref 0.3–1.2)
Total Protein: 7.3 g/dL (ref 6.5–8.1)

## 2017-08-05 MED ORDER — DOXYCYCLINE HYCLATE 50 MG PO CAPS: ORAL_CAPSULE | ORAL | 0 refills | Status: DC

## 2017-08-05 MED ORDER — KETOROLAC TROMETHAMINE 30 MG/ML IJ SOLN
15.0000 mg | Freq: Once | INTRAMUSCULAR | Status: AC
  Administered 2017-08-05: 15 mg via INTRAVENOUS
  Filled 2017-08-05: qty 1

## 2017-08-05 MED ORDER — CEPHALEXIN 500 MG PO CAPS: 500.0000 mg | ORAL_CAPSULE | Freq: Four times a day (QID) | ORAL | 0 refills | Status: DC

## 2017-08-05 MED ORDER — DOXYCYCLINE HYCLATE 100 MG PO TABS: ORAL_TABLET | ORAL | 0 refills | Status: DC

## 2017-08-05 NOTE — Telephone Encounter (Signed)
OK 

## 2017-08-05 NOTE — ED Provider Notes (Signed)
[facility] EMERGENCY DEPARTMENT Provider Note   CSN: [id number] Arrival date & time: 08/05/17  0321  Time seen 04:30 AM   History   Chief Complaint Chief Complaint  Patient presents with  . R knee edema    HPI [name] is a 63 y.o. male.  HPI patient states he had a right total knee replacement done on January 29 by Dr. Durward Fortes at [facility].  He was discharged from the hospital on January 31.  He started his physical therapy on February 1.  Wife states today, February 3 about 10 PM he had a fever of 100.7.  She gave him Tylenol and put ice on his knee.  When she rechecked his temperature about an hour later was normal.  He denies any chills or drainage.  He states however during the course of the day his right leg is getting more swollen and more red.  He states that he cannot wear his compression stockings due to the amount of swelling in his leg.  PCP Celene Squibb, MD Orthopedics Dr Durward Fortes  Past Medical History:  Diagnosis Date  . Arthritis    "right knee" (07/30/2017)  . CAP (community acquired pneumonia) 2018  . Hypertension   . Hypertension   . Wears glasses     Patient Active Problem List   Diagnosis Date Noted  . Primary osteoarthritis of right knee 07/30/2017  . S/P TKR (total knee replacement) using cement, right 07/30/2017  . Colon cancer screening 10/28/2013  . Umbilical hernia without mention of obstruction or gangrene 10/28/2013    Past Surgical History:  Procedure Laterality Date  . EYE SURGERY    . JOINT REPLACEMENT    . RETINAL DETACHMENT SURGERY Left 2000s  . TOTAL KNEE ARTHROPLASTY Right 07/30/2017  . TOTAL KNEE ARTHROPLASTY Right 07/30/2017   Procedure: RIGHT TOTAL KNEE ARTHROPLASTY;  Surgeon: [name], MD;  Location: [facility];  Service: Orthopedics;  Laterality: Right;  Marland Kitchen VASECTOMY         Home Medications    Prior to Admission medications   Medication Sig Start Date End Date Taking? Authorizing Provider  acetaminophen  (TYLENOL) 325 MG tablet Take 2 tablets (650 mg total) by mouth every 4 (four) hours as needed. 08/01/17   Cherylann Ratel, PA-C  losartan-hydrochlorothiazide (HYZAAR) 50-12.5 MG tablet Take 1 tablet by mouth daily.    [provider]  rivaroxaban (XARELTO) 10 MG TABS tablet Take 1 tablet (10 mg total) by mouth daily with breakfast. 08/02/17   Petrarca, Mike Craze, PA-C  traMADol (ULTRAM) 50 MG tablet Take 1-2 tablets (50-100 mg total) by mouth every 6 (six) hours as needed (as needed). 08/01/17   Cherylann Ratel, PA-C    Family History Family History  Problem Relation Age of Onset  . Colon cancer Neg Hx   . Colon polyps Neg Hx     Social History Social History   Tobacco Use  . Smoking status: Never Smoker  . Smokeless tobacco: Never Used  Substance Use Topics  . Alcohol use: No  . Drug use: No  employed Lives at home Lives with spouse   Allergies   Flexeril [cyclobenzaprine] and Robaxin [methocarbamol]   Review of Systems Review of Systems  All other systems reviewed and are negative.    Physical Exam Updated Vital Signs BP (!) 144/66   Pulse 70   Temp 98.4 F (36.9 C)   Resp 18   Ht 5\' 8"  (1.727 m)   Wt  121.6 kg (268 lb)   SpO2 99%   BMI 40.75 kg/m   Vital signs normal    Physical Exam  Constitutional: He appears well-developed and well-nourished.  HENT:  Head: Normocephalic and atraumatic.  Right Ear: External ear normal.  Left Ear: External ear normal.  Eyes: Conjunctivae and EOM are normal.  Eyes are dysconjugate, left eye deviates laterally  Neck: Normal range of motion.  Cardiovascular: Normal rate.  Pulmonary/Chest: Effort normal. No respiratory distress.  Musculoskeletal: He exhibits edema and tenderness.  Patient has a dressing on his midline right knee with only a few small areas of dried blood.  However he is noted to have diffuse swelling of his right leg with increased redness and warmth that is almost circumferential around his  distal thigh right knee area.  He has good distal pulses and capillary refill.  Skin: Skin is warm and dry. There is erythema.  Nursing note and vitals reviewed.         ED Treatments / Results  Labs (all labs ordered are listed, but only abnormal results are displayed) Results for orders placed or performed during the hospital encounter of 08/05/17  Comprehensive metabolic panel  Result Value Ref Range   Sodium 135 135 - 145 mmol/L   Potassium 3.1 (L) 3.5 - 5.1 mmol/L   Chloride 95 (L) 101 - 111 mmol/L   CO2 28 22 - 32 mmol/L   Glucose, Bld 165 (H) 65 - 99 mg/dL   BUN 11 6 - 20 mg/dL   Creatinine, Ser 0.74 0.61 - 1.24 mg/dL   Calcium 8.5 (L) 8.9 - 10.3 mg/dL   Total Protein 7.3 6.5 - 8.1 g/dL   Albumin 3.0 (L) 3.5 - 5.0 g/dL   AST 33 15 - 41 U/L   ALT 41 17 - 63 U/L   Alkaline Phosphatase 68 38 - 126 U/L   Total Bilirubin 0.9 0.3 - 1.2 mg/dL   GFR calc non Af Amer >60 >60 mL/min   GFR calc Af Amer >60 >60 mL/min   Anion gap 12 5 - 15  CBC with Differential  Result Value Ref Range   WBC 14.9 (H) 4.0 - 10.5 K/uL   RBC 3.56 (L) 4.22 - 5.81 MIL/uL   Hemoglobin 10.8 (L) 13.0 - 17.0 g/dL   HCT 34.1 (L) 39.0 - 52.0 %   MCV 95.8 78.0 - 100.0 fL   MCH 30.3 26.0 - 34.0 pg   MCHC 31.7 30.0 - 36.0 g/dL   RDW 12.5 11.5 - 15.5 %   Platelets 337 150 - 400 K/uL   Neutrophils Relative % 81 %   Neutro Abs 12.0 (H) 1.7 - 7.7 K/uL   Lymphocytes Relative 9 %   Lymphs Abs 1.3 0.7 - 4.0 K/uL   Monocytes Relative 10 %   Monocytes Absolute 1.5 (H) 0.1 - 1.0 K/uL   Eosinophils Relative 0 %   Eosinophils Absolute 0.0 0.0 - 0.7 K/uL   Basophils Relative 0 %   Basophils Absolute 0.0 0.0 - 0.1 K/uL   Laboratory interpretation all normal except leukocytosis (similar to what he had in the hospital), hypokalemia, hyperglycemia    EKG  EKG Interpretation None       Radiology No results found.  Procedures Procedures (including critical care time)  Medications Ordered in  ED Medications  ketorolac (TORADOL) 30 MG/ML injection 15 mg (not administered)     Initial Impression / Assessment and Plan / ED Course  I have reviewed the triage vital  signs and the nursing notes.  Pertinent labs & imaging results that were available during my care of the patient were reviewed by me and considered in my medical decision making (see chart for details).     To me patient has more than the usual amount of expected swelling and redness after total knee replacement.  IV was started and laboratory testing is done.  I have taken pictures of his leg, I will talk to the orthopedist about him once I see his CBC result.  Reviewing his vital signs patient did have a fever on the 30th at 8 PM to 101.8.  He has had a persistent low-grade leukocytosis since his surgery.  His temperature here has become low-grade at 99.1.  6:24 AM patient was discussed with Dr. Tonita Cong, orthopedist on call for Dr. Durward Fortes.  He states do not start antibiotics.  He wants the patient to call the office at 8 AM and have Dr. Durward Fortes see him in the office today.  He states if there is some concern about infection he will need to have his knee tapped by Dr. Durward Fortes.  He states the patient does not need to be admitted because he is "not septic right now".  I have talked to the patient and his family and has stressed the importance that he needs to be seen today in the office.   Final Clinical Impressions(s) / ED Diagnoses   Final diagnoses:  Pain and swelling of knee, right    ED Discharge Orders    None      Plan discharge  Rolland Porter, MD, Barbette Or, MD 08/05/17 [phone]

## 2017-08-05 NOTE — Progress Notes (Signed)
Office Visit Note   Patient: [name]           Date of Birth: [birth year]           MRN: [id number] Visit Date: 08/05/2017              Requested by: Celene Squibb, MD [address] [place], [place] [address] PCP: Celene Squibb, MD   Assessment & Plan: Visit Diagnoses:  1. History of total right knee replacement     Plan: Right total knee replacement 6 days ago. Discharged Thursday at 48 hours. Went to the emergency room yesterday with an elevated temperature and increasing pain of his right knee. Lab work reveal an elevated white count of 14,900. The day of discharge it was 14,000. Temperature was normal yesterday and was 97.7 in the office. Denies shortness of breath or chest pain. No difficulty urinating. Exam of the right knee is consistent with a cellulitis. We'll try Keflex and doxycycline and reevaluate in 48 hours in [place]. Try heat  Follow-Up Instructions: Return 2 days in [place].   Orders:  No orders of the defined types were placed in this encounter.  No orders of the defined types were placed in this encounter.     Procedures: No procedures performed   Clinical Data: No additional findings.   Subjective: Chief Complaint  Patient presents with  . Right Knee - Pain, Routine Post Op    [name] is a 62 y o S/P 6 days R TKA.  [name] is accompanied by his wife and seen for evaluation of a painful right total knee replacement. He 6 days postop. Has had an occasional temperature and some swelling of his right lower extremity. Went to the emergency room yesterday and referred to our office for further evaluation today. No specific diagnosis other than increased swelling and redness around the right knee  HPI  Review of Systems   Objective: Vital Signs: BP 127/81   Pulse 90   Resp 14   Ht 5\' 10"  (1.778 m)   Wt 250 lb (113.4 kg)   BMI 35.87 kg/m   Physical Exam  Ortho Exam awake alert and oriented 3. Comfortable sitting. Denies shortness  of breath or chest pain. No difficulty urinating. No calf pain. Mild ankle swelling. Mild foot swelling. Has been taking xarelto. Diffuse redness around the right knee incision. The incision appears to be clean without any drainage.  Specialty Comments:  No specialty comments available.  Imaging: No results found.   PMFS History: Patient Active Problem List   Diagnosis Date Noted  . Primary osteoarthritis of right knee 07/30/2017  . S/P TKR (total knee replacement) using cement, right 07/30/2017  . Colon cancer screening 10/28/2013  . Umbilical hernia without mention of obstruction or gangrene 10/28/2013   Past Medical History:  Diagnosis Date  . Arthritis    "right knee" (07/30/2017)  . CAP (community acquired pneumonia) 2018  . Hypertension   . Hypertension   . Wears glasses     Family History  Problem Relation Age of Onset  . Colon cancer Neg Hx   . Colon polyps Neg Hx     Past Surgical History:  Procedure Laterality Date  . EYE SURGERY    . JOINT REPLACEMENT    . RETINAL DETACHMENT SURGERY Left 2000s  . TOTAL KNEE ARTHROPLASTY Right 07/30/2017  . TOTAL KNEE ARTHROPLASTY Right 07/30/2017   Procedure: RIGHT TOTAL KNEE ARTHROPLASTY;  Surgeon: [name],  MD;  Location: [facility];  Service: Orthopedics;  Laterality: Right;  Marland Kitchen VASECTOMY     Social History   Occupational History  . Not on file  Tobacco Use  . Smoking status: Never Smoker  . Smokeless tobacco: Never Used  Substance and Sexual Activity  . Alcohol use: No  . Drug use: No  . Sexual activity: No     [name], MD   Note - This record has been created using [employer].  Chart creation errors have been sought, but may not always  have been located. Such creation errors do not reflect on  the standard of medical care.

## 2017-08-05 NOTE — Progress Notes (Deleted)
   Office Visit Note   Patient: [name]           Date of Birth: 1956/06/18           MRN: [id number] Visit Date: 08/05/2017              Requested by: Celene Squibb, MD [address] [place], [place] [address] PCP: Celene Squibb, MD   Assessment & Plan: Visit Diagnoses: No diagnosis found.  Plan: ***  Follow-Up Instructions: No Follow-up on file.   Orders:  No orders of the defined types were placed in this encounter.  No orders of the defined types were placed in this encounter.     Procedures: No procedures performed   Clinical Data: No additional findings.   Subjective: Chief Complaint  Patient presents with  . Right Knee - Pain, Routine Post Op    [name] is a 62 y o S/P 6 days R TKA.    HPI  Review of Systems  Constitutional: Negative for fatigue.  HENT: Negative for hearing loss.   Respiratory: Negative for apnea, chest tightness and shortness of breath.   Cardiovascular: Negative for chest pain, palpitations and leg swelling.  Gastrointestinal: Negative for blood in stool, constipation and diarrhea.  Genitourinary: Negative for difficulty urinating.  Musculoskeletal: Negative for arthralgias, back pain, joint swelling, myalgias, neck pain and neck stiffness.  Neurological: Negative for weakness, numbness and headaches.  Hematological: Does not bruise/bleed easily.  Psychiatric/Behavioral: Positive for sleep disturbance. The patient is not nervous/anxious.      Objective: Vital Signs: BP 127/81   Pulse 90   Resp 14   Ht 5\' 10"  (1.778 m)   Wt 250 lb (113.4 kg)   BMI 35.87 kg/m   Physical Exam  Ortho Exam  Specialty Comments:  No specialty comments available.  Imaging: No results found.   PMFS History: Patient Active Problem List   Diagnosis Date Noted  . Primary osteoarthritis of right knee 07/30/2017  . S/P TKR (total knee replacement) using cement, right 07/30/2017  . Colon cancer screening 10/28/2013  . Umbilical  hernia without mention of obstruction or gangrene 10/28/2013   Past Medical History:  Diagnosis Date  . Arthritis    "right knee" (07/30/2017)  . CAP (community acquired pneumonia) 2018  . Hypertension   . Hypertension   . Wears glasses     Family History  Problem Relation Age of Onset  . Colon cancer Neg Hx   . Colon polyps Neg Hx     Past Surgical History:  Procedure Laterality Date  . EYE SURGERY    . JOINT REPLACEMENT    . RETINAL DETACHMENT SURGERY Left 2000s  . TOTAL KNEE ARTHROPLASTY Right 07/30/2017  . TOTAL KNEE ARTHROPLASTY Right 07/30/2017   Procedure: RIGHT TOTAL KNEE ARTHROPLASTY;  Surgeon: [name], MD;  Location: [facility];  Service: Orthopedics;  Laterality: Right;  Marland Kitchen VASECTOMY     Social History   Occupational History  . Not on file  Tobacco Use  . Smoking status: Never Smoker  . Smokeless tobacco: Never Used  Substance and Sexual Activity  . Alcohol use: No  . Drug use: No  . Sexual activity: No

## 2017-08-05 NOTE — ED Triage Notes (Signed)
Pt with ER knee surgery on 07/30/17. Here today for Redness and swelling to R knee.

## 2017-08-05 NOTE — Telephone Encounter (Signed)
Yes-hold for several days until cellulitis improves

## 2017-08-05 NOTE — Discharge Instructions (Signed)
Elevate your leg, continue using the ice packs. Dr Tonita Cong who is on call this morning wants you to call the office this morning and get an appointment to have your knee examined today either by Dr Durward Fortes or one of his partners. It is very important that they see you today!

## 2017-08-06 ENCOUNTER — Telehealth (INDEPENDENT_AMBULATORY_CARE_PROVIDER_SITE_OTHER): Payer: Self-pay | Admitting: Orthopaedic Surgery

## 2017-08-06 NOTE — Telephone Encounter (Signed)
[name] called back stating that she forgot to include the fax # which is  [phone]

## 2017-08-06 NOTE — Telephone Encounter (Signed)
Disability.

## 2017-08-06 NOTE — Telephone Encounter (Signed)
[place], nurse calling from [employer] left a voicemail stating the patient filed a short term disability claim and we need the following information:  Surgery and procedure date, CPT codes, and expected return to work date.  If you have any questions, please call [phone]

## 2017-08-06 NOTE — Telephone Encounter (Signed)
Called and told Verdis Frederickson Dr. Rudene Anda note

## 2017-08-07 ENCOUNTER — Ambulatory Visit (INDEPENDENT_AMBULATORY_CARE_PROVIDER_SITE_OTHER): Payer: BLUE CROSS/BLUE SHIELD | Admitting: Orthopaedic Surgery

## 2017-08-07 ENCOUNTER — Encounter (INDEPENDENT_AMBULATORY_CARE_PROVIDER_SITE_OTHER): Payer: Self-pay | Admitting: Orthopaedic Surgery

## 2017-08-07 DIAGNOSIS — Z96651 Presence of right artificial knee joint: Secondary | ICD-10-CM

## 2017-08-07 NOTE — Progress Notes (Signed)
Office Visit Note   Patient: [name]           Date of Birth: 28-Jan-1956           MRN: [id number] Visit Date: 08/07/2017              Requested by: Celene Squibb, MD [address] [place], [place] [address] PCP: Celene Squibb, MD   Assessment & Plan: Visit Diagnoses:  1. History of total right knee replacement     Plan: One week status post primary right total knee replacement. I saw him in the office 48 hours ago with what appeared to be a cellulitis of the same knee. I placed him on doxycycline and Keflex. He appears to be much better. He has a little bit more motion, less pain and less redness. Continue on the above regimen and return in 1 week. We will hold on home health physical therapy until Monday. His wife will work with him on range of motion exercises until he restarts therapy next Monday  Follow-Up Instructions: Return in about 1 week (around 08/14/2017).   Orders:  No orders of the defined types were placed in this encounter.  No orders of the defined types were placed in this encounter.     Procedures: No procedures performed   Clinical Data: No additional findings.   Subjective: No chief complaint on file. Seen in the office 2 days ago with what appeared to be a cellulitis of his right knee. I placed him on doxycycline 100 mg twice a day and Keflex 500 mg 4 times a day. He is feeling much better with less pain and less swelling.  HPI  Review of Systems   Objective: Vital Signs: There were no vitals taken for this visit.  Physical Exam  Ortho Exam awake alert and oriented 3 comfortable sitting. Denies shortness of breath or chest pain. Much less swelling and redness about the right knee. No wound drainage. No calf pain.. Full extension and only about 60 of flexion  Specialty Comments:  No specialty comments available.  Imaging: No results found.   PMFS History: Patient Active Problem List   Diagnosis Date Noted  . Primary  osteoarthritis of right knee 07/30/2017  . S/P TKR (total knee replacement) using cement, right 07/30/2017  . Colon cancer screening 10/28/2013  . Umbilical hernia without mention of obstruction or gangrene 10/28/2013   Past Medical History:  Diagnosis Date  . Arthritis    "right knee" (07/30/2017)  . CAP (community acquired pneumonia) 2018  . Hypertension   . Hypertension   . Wears glasses     Family History  Problem Relation Age of Onset  . Colon cancer Neg Hx   . Colon polyps Neg Hx     Past Surgical History:  Procedure Laterality Date  . EYE SURGERY    . JOINT REPLACEMENT    . RETINAL DETACHMENT SURGERY Left 2000s  . TOTAL KNEE ARTHROPLASTY Right 07/30/2017  . TOTAL KNEE ARTHROPLASTY Right 07/30/2017   Procedure: RIGHT TOTAL KNEE ARTHROPLASTY;  Surgeon: [name], MD;  Location: [facility];  Service: Orthopedics;  Laterality: Right;  Marland Kitchen VASECTOMY     Social History   Occupational History  . Not on file  Tobacco Use  . Smoking status: Never Smoker  . Smokeless tobacco: Never Used  Substance and Sexual Activity  . Alcohol use: No  . Drug use: No  . Sexual activity: No     Collier Salina  Sharlotte Alamo, MD   Note - This record has been created using [employer].  Chart creation errors have been sought, but may not always  have been located. Such creation errors do not reflect on  the standard of medical care.

## 2017-08-08 ENCOUNTER — Telehealth (INDEPENDENT_AMBULATORY_CARE_PROVIDER_SITE_OTHER): Payer: Self-pay | Admitting: Orthopaedic Surgery

## 2017-08-08 NOTE — Telephone Encounter (Signed)
Verdis Frederickson, physical therapist for [facility] called to request verbal orders for [name] to start P.T. On Monday 08/12/17 for 3 times/week for 2 weeks.  Please call Verdis Frederickson at [phone]

## 2017-08-09 NOTE — Telephone Encounter (Signed)
ok 

## 2017-08-09 NOTE — Telephone Encounter (Signed)
done

## 2017-08-09 NOTE — Telephone Encounter (Signed)
OK to do-

## 2017-08-14 ENCOUNTER — Encounter (INDEPENDENT_AMBULATORY_CARE_PROVIDER_SITE_OTHER): Payer: Self-pay | Admitting: Orthopaedic Surgery

## 2017-08-14 ENCOUNTER — Ambulatory Visit (INDEPENDENT_AMBULATORY_CARE_PROVIDER_SITE_OTHER): Payer: BLUE CROSS/BLUE SHIELD

## 2017-08-14 ENCOUNTER — Ambulatory Visit (INDEPENDENT_AMBULATORY_CARE_PROVIDER_SITE_OTHER): Payer: BLUE CROSS/BLUE SHIELD | Admitting: Orthopaedic Surgery

## 2017-08-14 DIAGNOSIS — Z96651 Presence of right artificial knee joint: Secondary | ICD-10-CM

## 2017-08-14 NOTE — Progress Notes (Signed)
Office Visit Note   Patient: [name]           Date of Birth: 1956/05/16           MRN: [id number] Visit Date: 08/14/2017              Requested by: Celene Squibb, MD [address] [place], [place] [address] PCP: Celene Squibb, MD   Assessment & Plan: Visit Diagnoses:  1. History of total right knee replacement     Plan: 2 weeks status post right total knee replacement. He was seen last week with evidence of cellulitis. I placed him on Keflex and doxycycline and these feeling much better. Having very little pain. Taking tramadol physical therapy notes range of motion 0-65. May now bear weight to tolerance using a cane. We removed the clips applied Steri-Strips over benzoin. Office 2 weeks. Renewed  tramadol  Follow-Up Instructions: Return in about 2 weeks (around 08/28/2017).   Orders:  Orders Placed This Encounter  Procedures  . XR KNEE 3 VIEW RIGHT   No orders of the defined types were placed in this encounter.     Procedures: No procedures performed   Clinical Data: No additional findings.   Subjective: No chief complaint on file. 2 weeks status post primary right total knee replacement. Seen last week in the office with evidence of cellulitis about the right knee. Placed on Keflex and doxycycline and doing well. Feeling much better without any particular pain. Still partially weightbearing at 50% using a walker. Physical therapy notes range of motion 0-65  HPI  Review of Systems   Objective: Vital Signs: There were no vitals taken for this visit.  Physical Exam  Ortho Exam awake alert and oriented 3. Comfortable sitting. No specific pain about the right knee. Swelling is significantly decreased as well as the erythema about the right knee. No localized areas of tenderness. No calf pain or induration. Neurovascular exam intact distally. We will remove the clips applied Steri-Strips over benzoin. No instability  Specialty Comments:  No specialty  comments available.  Imaging: No results found.   PMFS History: Patient Active Problem List   Diagnosis Date Noted  . Primary osteoarthritis of right knee 07/30/2017  . S/P TKR (total knee replacement) using cement, right 07/30/2017  . Colon cancer screening 10/28/2013  . Umbilical hernia without mention of obstruction or gangrene 10/28/2013   Past Medical History:  Diagnosis Date  . Arthritis    "right knee" (07/30/2017)  . CAP (community acquired pneumonia) 2018  . Hypertension   . Hypertension   . Wears glasses     Family History  Problem Relation Age of Onset  . Colon cancer Neg Hx   . Colon polyps Neg Hx     Past Surgical History:  Procedure Laterality Date  . EYE SURGERY    . JOINT REPLACEMENT    . RETINAL DETACHMENT SURGERY Left 2000s  . TOTAL KNEE ARTHROPLASTY Right 07/30/2017  . TOTAL KNEE ARTHROPLASTY Right 07/30/2017   Procedure: RIGHT TOTAL KNEE ARTHROPLASTY;  Surgeon: [name], MD;  Location: [facility];  Service: Orthopedics;  Laterality: Right;  Marland Kitchen VASECTOMY     Social History   Occupational History  . Not on file  Tobacco Use  . Smoking status: Never Smoker  . Smokeless tobacco: Never Used  Substance and Sexual Activity  . Alcohol use: No  . Drug use: No  . Sexual activity: No     [name],  MD   Note - This record has been created using Editor, commissioning.  Chart creation errors have been sought, but may not always  have been located. Such creation errors do not reflect on  the standard of medical care.

## 2017-08-16 ENCOUNTER — Telehealth (HOSPITAL_COMMUNITY): Payer: Self-pay | Admitting: Internal Medicine

## 2017-08-16 NOTE — Telephone Encounter (Signed)
Patient's wife called to set up his PT appt. He is having home health they have his referral and is unsure of the dates. I asked her to bring it in so we could set up his appt.

## 2017-08-22 ENCOUNTER — Telehealth (INDEPENDENT_AMBULATORY_CARE_PROVIDER_SITE_OTHER): Payer: Self-pay | Admitting: Orthopaedic Surgery

## 2017-08-22 NOTE — Telephone Encounter (Signed)
PLease advise

## 2017-08-22 NOTE — Telephone Encounter (Signed)
Patient called requesting a prescription for a cane to be sent to [facility] in [place].

## 2017-08-22 NOTE — Telephone Encounter (Signed)
Ok for cane

## 2017-08-23 ENCOUNTER — Other Ambulatory Visit: Payer: Self-pay | Admitting: *Deleted

## 2017-08-23 DIAGNOSIS — M25561 Pain in right knee: Principal | ICD-10-CM

## 2017-08-23 DIAGNOSIS — G8929 Other chronic pain: Secondary | ICD-10-CM

## 2017-08-27 ENCOUNTER — Other Ambulatory Visit (INDEPENDENT_AMBULATORY_CARE_PROVIDER_SITE_OTHER): Payer: Self-pay | Admitting: Orthopedic Surgery

## 2017-08-27 MED ORDER — TRAMADOL HCL 50 MG PO TABS: 50.0000 mg | ORAL_TABLET | Freq: Three times a day (TID) | ORAL | 0 refills | Status: DC | PRN

## 2017-08-28 ENCOUNTER — Encounter (INDEPENDENT_AMBULATORY_CARE_PROVIDER_SITE_OTHER): Payer: Self-pay | Admitting: Orthopaedic Surgery

## 2017-08-28 ENCOUNTER — Encounter (HOSPITAL_COMMUNITY): Payer: Self-pay

## 2017-08-28 ENCOUNTER — Ambulatory Visit (HOSPITAL_COMMUNITY): Payer: BLUE CROSS/BLUE SHIELD | Attending: Orthopaedic Surgery

## 2017-08-28 ENCOUNTER — Ambulatory Visit (INDEPENDENT_AMBULATORY_CARE_PROVIDER_SITE_OTHER): Payer: BLUE CROSS/BLUE SHIELD | Admitting: Orthopaedic Surgery

## 2017-08-28 DIAGNOSIS — M25661 Stiffness of right knee, not elsewhere classified: Secondary | ICD-10-CM | POA: Diagnosis present

## 2017-08-28 DIAGNOSIS — R600 Localized edema: Secondary | ICD-10-CM | POA: Insufficient documentation

## 2017-08-28 DIAGNOSIS — Z96651 Presence of right artificial knee joint: Secondary | ICD-10-CM

## 2017-08-28 DIAGNOSIS — M6281 Muscle weakness (generalized): Secondary | ICD-10-CM

## 2017-08-28 DIAGNOSIS — R262 Difficulty in walking, not elsewhere classified: Secondary | ICD-10-CM | POA: Insufficient documentation

## 2017-08-28 NOTE — Patient Instructions (Signed)
  HEEL SLIDES - LONG SIT WITH TOWEL AND BELT  While in a sitting position, place a small hand towel under your heel. Next, loop a belt, towel or bed sheet around your foot and pull your knee into a bend position as your foot slides towards your buttock. Hold a gentle stretch and then return back to original position. Can be sitting down like he is in the picture or lying on your back  Perform 2x/day, 2-3 sets of 10-15 reps holding for 5-10 seconds at end range  HAMSTRING STRETCH WITH TOWEL  While lying down on your back, hook a towel or strap under  your foot and draw up your leg until a stretch is felt along the backside of your leg.   Keep your knee in a straightened position during the stretch.  Perform 2x/day, 3-5 stretches holding for 30-60 seconds each

## 2017-08-28 NOTE — Progress Notes (Signed)
   Office Visit Note   Patient: [name]           Date of Birth: 1956/01/02           MRN: [id number] Visit Date: 08/28/2017              Requested by: Celene Squibb, MD [address] [place], [place] [address] PCP: Celene Squibb, MD   Assessment & Plan: Visit Diagnoses:  1. History of total right knee replacement     Plan: One month status post right total knee replacement and doing well. Started physical therapy as an outpatient yesterday. Still using a walker. No related fever or chills. We will encourage exercises return in 1 month. Tramadol 50 mg #30. Advance to a single-point cane in left hand  Follow-Up Instructions: Return in about 1 month (around 09/25/2017).   Orders:  No orders of the defined types were placed in this encounter.  No orders of the defined types were placed in this encounter.     Procedures: No procedures performed   Clinical Data: No additional findings.   Subjective: No chief complaint on file. 1 month status post primary right total knee replacement complicated by cellulitis postoperatively this is treated with antibiotics without recurrence. Presently doing well without fever or chills. Still using a walker. Going to physical therapy and has achieved 90 of flexion. Taking much less tramadol  HPI  Review of Systems   Objective: Vital Signs: There were no vitals taken for this visit.  Physical Exam  Ortho Exam awake alert and oriented 3. Comfortable sitting. No significant pain around the right knee. Slightly warm as expected postoperatively. Easily flexes to 90. No instability. Incision healing nicely. No evidence of cellulitis. No distal edema. Neurovascular exam intact  Specialty Comments:  No specialty comments available.  Imaging: No results found.   PMFS History: Patient Active Problem List   Diagnosis Date Noted  . Primary osteoarthritis of right knee 07/30/2017  . S/P TKR (total knee replacement) using  cement, right 07/30/2017  . Colon cancer screening 10/28/2013  . Umbilical hernia without mention of obstruction or gangrene 10/28/2013   Past Medical History:  Diagnosis Date  . Arthritis    "right knee" (07/30/2017)  . CAP (community acquired pneumonia) 2018  . Hypertension   . Hypertension   . Wears glasses     Family History  Problem Relation Age of Onset  . Colon cancer Neg Hx   . Colon polyps Neg Hx     Past Surgical History:  Procedure Laterality Date  . EYE SURGERY    . JOINT REPLACEMENT    . RETINAL DETACHMENT SURGERY Left 2000s  . TOTAL KNEE ARTHROPLASTY Right 07/30/2017  . TOTAL KNEE ARTHROPLASTY Right 07/30/2017   Procedure: RIGHT TOTAL KNEE ARTHROPLASTY;  Surgeon: [name], MD;  Location: [facility];  Service: Orthopedics;  Laterality: Right;  Marland Kitchen VASECTOMY     Social History   Occupational History  . Not on file  Tobacco Use  . Smoking status: Never Smoker  . Smokeless tobacco: Never Used  Substance and Sexual Activity  . Alcohol use: No  . Drug use: No  . Sexual activity: No     [name], MD   Note - This record has been created using [employer].  Chart creation errors have been sought, but may not always  have been located. Such creation errors do not reflect on  the standard of medical care.

## 2017-08-28 NOTE — Therapy (Signed)
[facility] [place], [place], [address] Phone: [phone]   Fax:  [phone]  Physical Therapy Evaluation  Patient Details  Name: [name] MRN: [id number] Date of Birth: [birth year] Referring Provider: Joni Fears, MD   Encounter Date: 08/28/2017  PT End of Session - 08/28/17 1113    Visit Number  1    Number of Visits  19    Date for PT Re-Evaluation  09/18/17    Authorization Type  [employer] Other    Authorization Time Period  08/28/17 to 10/09/17    PT Start Time  1030    PT Stop Time  1111    PT Time Calculation (min)  41 min    Activity Tolerance  Patient tolerated treatment well    Behavior During Therapy  [facility] for tasks assessed/performed       Past Medical History:  Diagnosis Date  . Arthritis    "right knee" (07/30/2017)  . CAP (community acquired pneumonia) 2018  . Hypertension   . Hypertension   . Wears glasses     Past Surgical History:  Procedure Laterality Date  . EYE SURGERY    . JOINT REPLACEMENT    . RETINAL DETACHMENT SURGERY Left 2000s  . TOTAL KNEE ARTHROPLASTY Right 07/30/2017  . TOTAL KNEE ARTHROPLASTY Right 07/30/2017   Procedure: RIGHT TOTAL KNEE ARTHROPLASTY;  Surgeon: [name], MD;  Location: [facility];  Service: Orthopedics;  Laterality: Right;  Marland Kitchen VASECTOMY      There were no vitals filed for this visit.   Subjective Assessment - 08/28/17 1034    Subjective  Pt reports having R TKA on 07/30/17 by Dr. Durward Fortes. He states that pain and his knee being bone on bone is what led to his surgery. He had HHPT following his TKA but was d/c last week. He was not using an AD prior to his surgery but currently presents with a RW. He is having the most difficulty with his balance and bending it. He has some knee pain when he over-works it. He works at [employer] and wants to get back to working once his knee is better.     Limitations  Walking    How long can you sit comfortably?  30-40 mins     How long can you stand comfortably?  40-55 mins    How long can you walk comfortably?  hasn't been pushing it    Patient Stated Goals  get back close to normal    Currently in Pain?  Yes    Pain Score  6     Pain Location  Knee    Pain Orientation  Right    Pain Descriptors / Indicators  Aching    Pain Type  Surgical pain    Pain Onset  More than a month ago    Pain Frequency  Constant    Aggravating Factors   bending    Pain Relieving Factors  keep it moving    Effect of Pain on Daily Activities  increases    Multiple Pain Sites  No         [facility] PT Assessment - 08/28/17 0001      Assessment   Medical Diagnosis  R TKA    Referring Provider  Joni Fears, MD    Onset Date/Surgical Date  07/30/17    Next MD Visit  08/28/17    Prior Therapy  HHPT, d/c last week      Balance  Screen   Has the patient fallen in the past 6 months  No    Has the patient had a decrease in activity level because of a fear of falling?   No    Is the patient reluctant to leave their home because of a fear of falling?   No      Home Environment   Additional Comments  4 STE      Prior Function   Level of Independence  Independent      Observation/Other Assessments   Focus on Therapeutic Outcomes (FOTO)   66% limited      Observation/Other Assessments-Edema    Edema  Circumferential      Circumferential Edema   Circumferential - Right  43.16mm joint line    Circumferential - Left   39.6mm joint line      ROM / Strength   AROM / PROM / Strength  AROM;Strength      AROM   AROM Assessment Site  Knee    Right Knee Extension  17    Right Knee Flexion  74 AAROM 82      Strength   Strength Assessment Site  Hip;Knee;Ankle    Right Hip Flexion  4+/5    Right Hip Extension  3-/5    Right Hip ABduction  4-/5    Left Hip Flexion  5/5    Left Hip Extension  3/5    Left Hip ABduction  4+/5    Right Knee Flexion  4/5    Right Knee Extension  4+/5    Left Knee Flexion  5/5    Left Knee  Extension  5/5    Right Ankle Dorsiflexion  5/5    Left Ankle Dorsiflexion  5/5      Palpation   Patella mobility  hypomobile sup/inf    Palpation comment  Medial and lateral joint line and VLO/VMO tenderness      Ambulation/Gait   Ambulation Distance (Feet)  452 Feet 3MWT    Assistive device  Rolling walker    Gait Pattern  Step-through pattern;Decreased stance time - right;Decreased step length - left;Decreased hip/knee flexion - right;Decreased dorsiflexion - right;Trendelenburg;Antalgic;Lateral trunk lean to right;Trunk flexed      Balance   Balance Assessed  Yes      Static Standing Balance   Static Standing - Balance Support  No upper extremity supported    Static Standing Balance -  Activities   Single Leg Stance - Right Leg;Single Leg Stance - Left Leg    Static Standing - Comment/# of Minutes  <1 sec BLE      Standardized Balance Assessment   Standardized Balance Assessment  Five Times Sit to Stand;Timed Up and Go Test    Five times sit to stand comments   16.6, chair, no UE, decreased weight shift off RL      Timed Up and Go Test   TUG  Normal TUG    Normal TUG (seconds)  15.9 RW          Objective measurements completed on examination: See above findings.       PT Education - 08/28/17 1112    Education provided  Yes    Education Details  exam findings, POC, HEP    Person(s) Educated  Patient;Spouse    Methods  Demonstration;Explanation;Handout    Comprehension  Returned demonstration;Verbalized understanding;Tactile cues required       PT Short Term Goals - 08/28/17 1245      PT SHORT  TERM GOAL #1   Title  Pt will be independent with HEP and perform consistently in order to decrease pain and maximize ROM.    Time  3    Period  Weeks    Status  New    Target Date  09/18/17      PT SHORT TERM GOAL #2   Title  Pt will have improved R knee AROM from 5-95 deg in order to decrease pain and maximize gait.    Time  3    Period  Weeks    Status  New       PT SHORT TERM GOAL #3   Title  Pt will have decreased joint line edema by 15mm in order to decrease pain and maximize ROM.    Time  3    Period  Weeks      PT SHORT TERM GOAL #4   Title  Pt will be able to perform bil SLS for at least 10 sec with no UE in order to maximize gait and balance.    Time  3    Period  Weeks    Status  New        PT Long Term Goals - 08/28/17 1247      PT LONG TERM GOAL #1   Title  Pt will have improved R knee AROM to 0-115deg in order to maximize gait, stair ambulation, and overall return to PLOF.    Time  6    Period  Weeks    Status  New    Target Date  10/09/17      PT LONG TERM GOAL #2   Title  Pt will have improved MMT of all muscle groups tested by 1 grade or > in order to maximize gait, balance, and overall functional strength.    Time  6    Period  Weeks    Status  New      PT LONG TERM GOAL #3   Title  Pt will be able to perform 5xSTS in 12 sec or < with proper mechanics and no UE support to demo improved functional BLE strength and balance.    Time  6    Period  Weeks    Status  New      PT LONG TERM GOAL #4   Title  Pt will be able to perform the TUG with LRAD in 10 sec or < to demo improved functional strength, balance, and maximize community ambulation.    Time  6    Period  Weeks    Status  New      PT LONG TERM GOAL #5   Title  Pt will have improved 3MWT by 150ft or > with LRAD and gait WFL in order to demo improved functional strength, gait, and maximize community access.    Time  6    Period  Weeks    Status  New             Plan - 08/28/17 1240    Clinical Impression Statement  Pt is pleasant 62 YO M who presents to OPPT s/p R TKA on 07/30/17 by Dr. Durward Fortes. Pt currently presents with post-op deficits in ROM, MMT, edema, functional strength, balance, and gait. Pt's AROM 17-74deg this date, AAROM flexion 82deg. Pt's scar well-healed, some warmth present but likely due to increased edema as there was not any  redness noted. Pt with increased joint line edema as he was 43.35mm on the R  and 39.40mm on the L. Pt needs skilled PT intervention to address these deficits in order to decrease pain, improve ROM, and promote return to PLOF.    History and Personal Factors relevant to plan of care:  h/o arthritis, CAP, HTN; chronic LBP with subjective reports of sciatica    Clinical Presentation  Stable    Clinical Presentation due to:  ROM, MMT, edema, SLS, 5xSTS, 3MWT, TUG, functional strength    Clinical Decision Making  Low    Rehab Potential  Good    PT Frequency  3x / week    PT Duration  6 weeks    PT Treatment/Interventions  ADLs/Self Care Home Management;Cryotherapy;Electrical Stimulation;Moist Heat;Ultrasound;DME Instruction;Gait training;Stair training;Functional mobility training;Therapeutic activities;Therapeutic exercise;Balance training;Neuromuscular re-education;Patient/family education;Manual techniques;Scar mobilization;Passive range of motion;Dry needling;Energy conservation;Taping    PT Next Visit Plan  review goals and ensure compliance with HEP; initiate quad sets, heel slides, SAQ,calf stretch, seated knee flexion stretch, manual for edema    PT Home Exercise Plan  eval: heel slides and supine HS stretch    Recommended Other Services  potentially thigh-high compression garment for RLE edema    Consulted and Agree with Plan of Care  Patient;Family member/caregiver    Family Member Consulted  wife       Patient will benefit from skilled therapeutic intervention in order to improve the following deficits and impairments:  Abnormal gait, Decreased activity tolerance, Decreased balance, Decreased mobility, Decreased range of motion, Decreased scar mobility, Decreased strength, Difficulty walking, Hypomobility, Increased edema, Increased fascial restricitons, Increased muscle spasms, Impaired flexibility, Improper body mechanics, Pain  Visit Diagnosis: Stiffness of right knee, not elsewhere  classified - Plan: PT plan of care cert/re-cert  Localized edema - Plan: PT plan of care cert/re-cert  Difficulty in walking, not elsewhere classified - Plan: PT plan of care cert/re-cert  Muscle weakness (generalized) - Plan: PT plan of care cert/re-cert     Problem List Patient Active Problem List   Diagnosis Date Noted  . Primary osteoarthritis of right knee 07/30/2017  . S/P TKR (total knee replacement) using cement, right 07/30/2017  . Colon cancer screening 10/28/2013  . Umbilical hernia without mention of obstruction or gangrene 10/28/2013       Geraldine Solar PT, DPT  [facility] [address] [place], [place], [address] Phone: [phone]   Fax:  [phone]  Name: [name] MRN: [id number] Date of Birth: Jul 11, 1955

## 2017-08-30 ENCOUNTER — Encounter (HOSPITAL_COMMUNITY): Payer: Self-pay

## 2017-08-30 ENCOUNTER — Ambulatory Visit (HOSPITAL_COMMUNITY): Payer: BLUE CROSS/BLUE SHIELD | Attending: Orthopaedic Surgery

## 2017-08-30 DIAGNOSIS — R262 Difficulty in walking, not elsewhere classified: Secondary | ICD-10-CM | POA: Insufficient documentation

## 2017-08-30 DIAGNOSIS — M6281 Muscle weakness (generalized): Secondary | ICD-10-CM | POA: Diagnosis present

## 2017-08-30 DIAGNOSIS — M25661 Stiffness of right knee, not elsewhere classified: Secondary | ICD-10-CM | POA: Insufficient documentation

## 2017-08-30 DIAGNOSIS — R600 Localized edema: Secondary | ICD-10-CM | POA: Diagnosis present

## 2017-08-30 NOTE — Patient Instructions (Signed)
  KNEE FLEXION STRETCH - SELF ASSISTED  While seated in a chair, use your Left leg to bend your right knee back until a stretch is felt.  Perform 1-2x/day, 5-10 reps holding for 10-15 seconds each   QUAD SET  Tighten your top thigh muscle as you attempt to press the back of your knee downward towards the table.  Perform 1-2x/day, 2-3 sets of 10-15 reps holding for 5-10 seconds each

## 2017-08-30 NOTE — Therapy (Addendum)
[place], [place], [address] Phone: [phone]   Fax:  [phone]  Physical Therapy Treatment  Patient Details  Name: [name] MRN: [id number] Date of Birth: 09/13/1955 Referring Provider: Joni Fears, MD   Encounter Date: 08/30/2017  PT End of Session - 08/30/17 1031    Visit Number  2    Number of Visits  19    Date for PT Re-Evaluation  09/18/17    Authorization Type  [employer] Other    Authorization Time Period  08/28/17 to 10/09/17    PT Start Time  0945    PT Stop Time  1029    PT Time Calculation (min)  44 min    Activity Tolerance  Patient tolerated treatment well    Behavior During Therapy  [facility] for tasks assessed/performed       Past Medical History:  Diagnosis Date  . Arthritis    "right knee" (07/30/2017)  . CAP (community acquired pneumonia) 2018  . Hypertension   . Hypertension   . Wears glasses     Past Surgical History:  Procedure Laterality Date  . EYE SURGERY    . JOINT REPLACEMENT    . RETINAL DETACHMENT SURGERY Left 2000s  . TOTAL KNEE ARTHROPLASTY Right 07/30/2017  . TOTAL KNEE ARTHROPLASTY Right 07/30/2017   Procedure: RIGHT TOTAL KNEE ARTHROPLASTY;  Surgeon: [name], MD;  Location: [place];  Service: Orthopedics;  Laterality: Right;  Marland Kitchen VASECTOMY      There were no vitals filed for this visit.  Subjective Assessment - 08/30/17 0952    Subjective  Pt states that his MD was very pleased with his progress so far during his f/u Wednesday. He states he flexed it to 95 deg. He currently has achey pain under his kene cap at 4/10.     Limitations  Walking    How long can you sit comfortably?  30-40 mins    How long can you stand comfortably?  40-55 mins    How long can you walk comfortably?  hasn't been pushing it    Patient Stated Goals  get back close to normal    Currently in Pain?  Yes    Pain Score  4     Pain Location  Knee    Pain Orientation  Right    Pain  Descriptors / Indicators  Aching    Pain Type  Surgical pain    Pain Onset  More than a month ago    Pain Frequency  Constant    Aggravating Factors   bending    Pain Relieving Factors  keep it moving    Effect of Pain on Daily Activities  increases          [facility] Adult PT Treatment/Exercise - 08/30/17 0001      Ambulation/Gait   Ambulation Distance (Feet)  226 Feet    Assistive device  Rolling walker    Gait Pattern  Step-through pattern;Decreased stance time - right;Decreased step length - left;Decreased hip/knee flexion - right;Decreased dorsiflexion - right;Trendelenburg;Antalgic;Lateral trunk lean to right;Trunk flexed    Gait Comments  cues for improved heel strike and push-off      Exercises   Exercises  Knee/Hip      Knee/Hip Exercises: Stretches   Passive Hamstring Stretch  Right;2 reps;30 seconds    Passive Hamstring Stretch Limitations  supine with rope    Gastroc Stretch  Both;2 reps;30 seconds    Gastroc Stretch  Limitations  slant board    Other Knee/Hip Stretches  seated R knee flexion stretch 5x10-15"      Knee/Hip Exercises: Supine   Quad Sets  Right;2 sets;10 reps    Quad Sets Limitations  2-3" holds    Short Arc [employer]  Right;10 reps    Short Arc [employer] Limitations  basketball, 5" holds    Heel Slides  Right;10 reps    Heel Slides Limitations  3" holds    Knee Extension Limitations  13    Knee Flexion Limitations  80      Manual Therapy   Manual Therapy  Edema management    Manual therapy comments  completed rest of treatment    Edema Management  retro massage with BLE elevated            PT Education - 08/30/17 1030    Education provided  Yes    Education Details  reviewed goals, HEP; added seated kene flexion stretch and quad set to [employer]) Educated  Patient    Methods  Explanation;Demonstration;Handout    Comprehension  Verbalized understanding;Returned demonstration;Tactile cues required       PT Short Term Goals -  08/30/17 1611      PT SHORT TERM GOAL #1   Title  Pt will be independent with HEP and perform consistently in order to decrease pain and maximize ROM.    Time  3    Period  Weeks    Status  New      PT SHORT TERM GOAL #2   Title  Pt will have improved R knee AROM from 5-95 deg in order to decrease pain and maximize gait.    Time  3    Period  Weeks    Status  New      PT SHORT TERM GOAL #3   Title  Pt will have decreased joint line edema by 4cm in order to decrease pain and maximize ROM.    Baseline  -- corrected on 08/30/17 to state "cm" as "mm" was a typo    Time  3    Period  Weeks      PT SHORT TERM GOAL #4   Title  Pt will be able to perform bil SLS for at least 10 sec with no UE in order to maximize gait and balance.    Time  3    Period  Weeks    Status  New        PT Long Term Goals - 08/30/17 1612      PT LONG TERM GOAL #1   Title  Pt will have improved R knee AROM to 0-115deg in order to maximize gait, stair ambulation, and overall return to PLOF.    Time  6    Period  Weeks    Status  New      PT LONG TERM GOAL #2   Title  Pt will have improved MMT of all muscle groups tested by 1 grade or > in order to maximize gait, balance, and overall functional strength.    Time  6    Period  Weeks    Status  New      PT LONG TERM GOAL #3   Title  Pt will be able to perform 5xSTS in 12 sec or < with proper mechanics and no UE support to demo improved functional BLE strength and balance.    Time  6  Period  Weeks    Status  New      PT LONG TERM GOAL #4   Title  Pt will be able to perform the TUG with LRAD in 10 sec or < to demo improved functional strength, balance, and maximize community ambulation.    Time  6    Period  Weeks    Status  New      PT LONG TERM GOAL #5   Title  Pt will have improved 3MWT by 143ft or > with LRAD and gait WFL in order to demo improved functional strength, gait, and maximize community access.    Time  6    Period  Weeks    Status   New            Plan - 08/30/17 1032    Clinical Impression Statement  Reviewed initial goals and issued copy of eval with no f/u questions. Began session with retro massage with BLE elevated for edema management. Rest of session focused on mobility or R knee. Pt with min cues for proper exercise technique. Pt demo'd poor quad contraction during quad sets and required cues to decrease glute compensation; this improved with increased reps. AROM 13 to 80deg this date. Continue as planned progressing as able.     Rehab Potential  Good    PT Frequency  3x / week    PT Duration  6 weeks    PT Treatment/Interventions  ADLs/Self Care Home Management;Cryotherapy;Electrical Stimulation;Moist Heat;Ultrasound;DME Instruction;Gait training;Stair training;Functional mobility training;Therapeutic activities;Therapeutic exercise;Balance training;Neuromuscular re-education;Patient/family education;Manual techniques;Scar mobilization;Passive range of motion;Dry needling;Energy conservation;Taping    PT Next Visit Plan  continue quad sets, heel slides, SAQ, calf stretch, continue exercises for knee mobility progressing to tolerance; continue manual for edema    PT Home Exercise Plan  eval: heel slides and supine HS stretch; 3/1: seated knee flexion stretch, quad set    Consulted and Agree with Plan of Care  Patient;Family member/caregiver    Family Member Consulted  wife       Patient will benefit from skilled therapeutic intervention in order to improve the following deficits and impairments:  Abnormal gait, Decreased activity tolerance, Decreased balance, Decreased mobility, Decreased range of motion, Decreased scar mobility, Decreased strength, Difficulty walking, Hypomobility, Increased edema, Increased fascial restricitons, Increased muscle spasms, Impaired flexibility, Improper body mechanics, Pain  Visit Diagnosis: Stiffness of right knee, not elsewhere classified  Localized edema  Difficulty in  walking, not elsewhere classified  Muscle weakness (generalized)     Problem List Patient Active Problem List   Diagnosis Date Noted  . Primary osteoarthritis of right knee 07/30/2017  . S/P TKR (total knee replacement) using cement, right 07/30/2017  . Colon cancer screening 10/28/2013  . Umbilical hernia without mention of obstruction or gangrene 10/28/2013       Geraldine Solar PT, DPT  [facility] [address] [place], [place], [address] Phone: [phone]   Fax:  [phone]  Name: [name] MRN: [id number] Date of Birth: Apr 02, 1956

## 2017-09-02 ENCOUNTER — Encounter (HOSPITAL_COMMUNITY): Payer: Self-pay

## 2017-09-02 ENCOUNTER — Ambulatory Visit (HOSPITAL_COMMUNITY): Payer: BLUE CROSS/BLUE SHIELD

## 2017-09-02 DIAGNOSIS — M25661 Stiffness of right knee, not elsewhere classified: Secondary | ICD-10-CM | POA: Diagnosis not present

## 2017-09-02 DIAGNOSIS — R600 Localized edema: Secondary | ICD-10-CM

## 2017-09-02 DIAGNOSIS — R262 Difficulty in walking, not elsewhere classified: Secondary | ICD-10-CM

## 2017-09-02 DIAGNOSIS — M6281 Muscle weakness (generalized): Secondary | ICD-10-CM

## 2017-09-02 NOTE — Therapy (Signed)
[place], [place], [address] Phone: [phone]   Fax:  [phone]  Physical Therapy Treatment  Patient Details  Name: [name] MRN: [id number] Date of Birth: [birth year] Referring Provider: Joni Fears, MD   Encounter Date: 09/02/2017  PT End of Session - 09/02/17 1034    Visit Number  3    Number of Visits  19    Date for PT Re-Evaluation  09/18/17    Authorization Type  [employer] Other    Authorization Time Period  08/28/17 to 10/09/17    PT Start Time  1030    PT Stop Time  1111    PT Time Calculation (min)  41 min    Activity Tolerance  Patient tolerated treatment well    Behavior During Therapy  [facility] for tasks assessed/performed       Past Medical History:  Diagnosis Date  . Arthritis    "right knee" (07/30/2017)  . CAP (community acquired pneumonia) 2018  . Hypertension   . Hypertension   . Wears glasses     Past Surgical History:  Procedure Laterality Date  . EYE SURGERY    . JOINT REPLACEMENT    . RETINAL DETACHMENT SURGERY Left 2000s  . TOTAL KNEE ARTHROPLASTY Right 07/30/2017  . TOTAL KNEE ARTHROPLASTY Right 07/30/2017   Procedure: RIGHT TOTAL KNEE ARTHROPLASTY;  Surgeon: [name], MD;  Location: [place];  Service: Orthopedics;  Laterality: Right;  Marland Kitchen VASECTOMY      There were no vitals filed for this visit.  Subjective Assessment - 09/02/17 1034    Subjective  Pt reports that he's doing well. He states he feels like his knee is starting to get better. No pain currently.     Limitations  Walking    How long can you sit comfortably?  30-40 mins    How long can you stand comfortably?  40-55 mins    How long can you walk comfortably?  hasn't been pushing it    Patient Stated Goals  get back close to normal    Currently in Pain?  No/denies    Pain Onset  More than a month ago         [facility] PT Assessment - 09/02/17 0001      Circumferential Edema   Circumferential -  Right  41.9cm jiont line (was 43.5cm)       [facility] Adult PT Treatment/Exercise - 09/02/17 0001      Knee/Hip Exercises: Stretches   Passive Hamstring Stretch  Right;3 reps;30 seconds    Passive Hamstring Stretch Limitations  standing, 12" step    Knee: Self-Stretch to increase Flexion  Right;5 reps;10 seconds    Knee: Self-Stretch Limitations  standing, 12" step    Gastroc Stretch  Both;3 reps;30 seconds    Gastroc Stretch Limitations  slant board      Knee/Hip Exercises: Standing   Terminal Knee Extension Limitations  10x10" GTB    Rocker Board  2 minutes;Limitations    Rocker Board Limitations  R/L      Knee/Hip Exercises: Supine   Quad Sets  Right;10 reps    [employer] Limitations  2-3" holds    Short [employer]  Right;10 reps    Short [employer] Limitations  basketball, 5" holds    Heel Slides  Right;10 reps    Heel Slides Limitations  3" holds    Knee Extension Limitations  13    Knee Flexion  Limitations  85      Manual Therapy   Manual Therapy  Edema management    Manual therapy comments  completed rest of treatment    Edema Management  retro massage with BLE elevated         PT Education - 09/02/17 1034    Education provided  Yes    Education Details  exercise technique    Person(s) Educated  Patient    Methods  Explanation;Demonstration    Comprehension  Verbalized understanding;Returned demonstration       PT Short Term Goals - 08/30/17 1611      PT SHORT TERM GOAL #1   Title  Pt will be independent with HEP and perform consistently in order to decrease pain and maximize ROM.    Time  3    Period  Weeks    Status  New      PT SHORT TERM GOAL #2   Title  Pt will have improved R knee AROM from 5-95 deg in order to decrease pain and maximize gait.    Time  3    Period  Weeks    Status  New      PT SHORT TERM GOAL #3   Title  Pt will have decreased joint line edema by 4cm in order to decrease pain and maximize ROM.    Baseline  -- corrected on  08/30/17 to state "cm" as "mm" was a typo    Time  3    Period  Weeks      PT SHORT TERM GOAL #4   Title  Pt will be able to perform bil SLS for at least 10 sec with no UE in order to maximize gait and balance.    Time  3    Period  Weeks    Status  New        PT Long Term Goals - 08/30/17 1612      PT LONG TERM GOAL #1   Title  Pt will have improved R knee AROM to 0-115deg in order to maximize gait, stair ambulation, and overall return to PLOF.    Time  6    Period  Weeks    Status  New      PT LONG TERM GOAL #2   Title  Pt will have improved MMT of all muscle groups tested by 1 grade or > in order to maximize gait, balance, and overall functional strength.    Time  6    Period  Weeks    Status  New      PT LONG TERM GOAL #3   Title  Pt will be able to perform 5xSTS in 12 sec or < with proper mechanics and no UE support to demo improved functional BLE strength and balance.    Time  6    Period  Weeks    Status  New      PT LONG TERM GOAL #4   Title  Pt will be able to perform the TUG with LRAD in 10 sec or < to demo improved functional strength, balance, and maximize community ambulation.    Time  6    Period  Weeks    Status  New      PT LONG TERM GOAL #5   Title  Pt will have improved 3MWT by 153ft or > with LRAD and gait WFL in order to demo improved functional strength, gait, and maximize community access.    Time  6    Period  Weeks    Status  New            Plan - 09/02/17 1112    Clinical Impression Statement  Added standing mobility and stretching this date with good tolerance. Pt requiring verbal and tactile cues for TKE. He continues with deficits in quad contraction during quad sets. Ended with manual for edema control. Pt's AROM 13 to 85deg this date. Edema down to 41.9cm at joint line whereas it was 43.5cm at eval. Continue as planned, progressing as able.    Rehab Potential  Good    PT Frequency  3x / week    PT Duration  6 weeks    PT  Treatment/Interventions  ADLs/Self Care Home Management;Cryotherapy;Electrical Stimulation;Moist Heat;Ultrasound;DME Instruction;Gait training;Stair training;Functional mobility training;Therapeutic activities;Therapeutic exercise;Balance training;Neuromuscular re-education;Patient/family education;Manual techniques;Scar mobilization;Passive range of motion;Dry needling;Energy conservation;Taping    PT Next Visit Plan  continue quad sets, heel slides, SAQ, calf stretch, continue exercises for knee mobility progressing to tolerance; continue standing mobility work; continue manual for edema    PT Home Exercise Plan  eval: heel slides and supine HS stretch; 3/1: seated knee flexion stretch, quad set    Consulted and Agree with Plan of Care  Patient    Family Member Consulted  --       Patient will benefit from skilled therapeutic intervention in order to improve the following deficits and impairments:  Abnormal gait, Decreased activity tolerance, Decreased balance, Decreased mobility, Decreased range of motion, Decreased scar mobility, Decreased strength, Difficulty walking, Hypomobility, Increased edema, Increased fascial restricitons, Increased muscle spasms, Impaired flexibility, Improper body mechanics, Pain  Visit Diagnosis: Stiffness of right knee, not elsewhere classified  Localized edema  Difficulty in walking, not elsewhere classified  Muscle weakness (generalized)     Problem List Patient Active Problem List   Diagnosis Date Noted  . Primary osteoarthritis of right knee 07/30/2017  . S/P TKR (total knee replacement) using cement, right 07/30/2017  . Colon cancer screening 10/28/2013  . Umbilical hernia without mention of obstruction or gangrene 10/28/2013        Geraldine Solar PT, DPT  [facility] [address] [place], [place], [address] Phone: [phone]   Fax:  [phone]  Name: CALIL AMOR MRN: [id number] Date of  Birth: [birth year]

## 2017-09-04 ENCOUNTER — Encounter (HOSPITAL_COMMUNITY): Payer: Self-pay

## 2017-09-04 ENCOUNTER — Ambulatory Visit (HOSPITAL_COMMUNITY): Payer: BLUE CROSS/BLUE SHIELD

## 2017-09-04 DIAGNOSIS — R262 Difficulty in walking, not elsewhere classified: Secondary | ICD-10-CM

## 2017-09-04 DIAGNOSIS — M25661 Stiffness of right knee, not elsewhere classified: Secondary | ICD-10-CM | POA: Diagnosis not present

## 2017-09-04 DIAGNOSIS — M6281 Muscle weakness (generalized): Secondary | ICD-10-CM

## 2017-09-04 DIAGNOSIS — R600 Localized edema: Secondary | ICD-10-CM

## 2017-09-04 NOTE — Therapy (Signed)
[place], [place], [address] Phone: [phone]   Fax:  [phone]  Physical Therapy Treatment  Patient Details  Name: [name] MRN: [id number] Date of Birth: 03-30-1956 Referring Provider: Joni Fears, MD   Encounter Date: 09/04/2017  PT End of Session - 09/04/17 1030    Visit Number  4    Number of Visits  19    Date for PT Re-Evaluation  09/18/17    Authorization Type  [employer] Other    Authorization Time Period  08/28/17 to 10/09/17    PT Start Time  1030    PT Stop Time  1115    PT Time Calculation (min)  45 min    Activity Tolerance  Patient tolerated treatment well    Behavior During Therapy  [facility] for tasks assessed/performed       Past Medical History:  Diagnosis Date  . Arthritis    "right knee" (07/30/2017)  . CAP (community acquired pneumonia) 2018  . Hypertension   . Hypertension   . Wears glasses     Past Surgical History:  Procedure Laterality Date  . EYE SURGERY    . JOINT REPLACEMENT    . RETINAL DETACHMENT SURGERY Left 2000s  . TOTAL KNEE ARTHROPLASTY Right 07/30/2017  . TOTAL KNEE ARTHROPLASTY Right 07/30/2017   Procedure: RIGHT TOTAL KNEE ARTHROPLASTY;  Surgeon: [name], MD;  Location: [place];  Service: Orthopedics;  Laterality: Right;  Marland Kitchen VASECTOMY      There were no vitals filed for this visit.  Subjective Assessment - 09/04/17 1030    Subjective  Pt states that he's doing well this morning. Denies any pain. He reports compliance with HEP.    Limitations  Walking    How long can you sit comfortably?  30-40 mins    How long can you stand comfortably?  40-55 mins    How long can you walk comfortably?  hasn't been pushing it    Patient Stated Goals  get back close to normal    Currently in Pain?  No/denies    Pain Onset  More than a month ago           [facility] Adult PT Treatment/Exercise - 09/04/17 0001      Knee/Hip Exercises: Stretches   Passive  Hamstring Stretch  Right;3 reps;30 seconds    Passive Hamstring Stretch Limitations  standing, 12" step    Knee: Self-Stretch to increase Flexion  Right;5 reps;10 seconds    Knee: Self-Stretch Limitations  standing, 12" step    Gastroc Stretch  Both;3 reps;30 seconds    Gastroc Stretch Limitations  slant board      Knee/Hip Exercises: Standing   Heel Raises  Both;10 reps;Limitations    Heel Raises Limitations  heel and toe    Terminal Knee Extension Limitations  10x10" GTB    Rocker Board  2 minutes;Limitations    Rocker Board Limitations  R/L      Knee/Hip Exercises: Supine   Quad Sets  Right;10 reps    Quad Sets Limitations  +manual over pressure x5" holds each    [employer]  Right;10 reps    Short Arc Quad Sets Limitations  basketball, 5" holds    Heel Slides  Right;10 reps    Heel Slides Limitations  + manual over pressure x5" holds    Knee Extension Limitations  AROM 10; PROM 10    Knee Flexion Limitations  AROM 86; PROM  90      Manual Therapy   Manual Therapy  Edema management;Joint mobilization    Manual therapy comments  completed rest of treatment    Edema Management  retro massage with BLE elevated    Joint Mobilization  patellar mobs all directions           PT Education - 09/04/17 1030    Education provided  Yes    Education Details  exercise technique    Person(s) Educated  Patient    Methods  Explanation;Demonstration    Comprehension  Verbalized understanding;Returned demonstration       PT Short Term Goals - 08/30/17 1611      PT SHORT TERM GOAL #1   Title  Pt will be independent with HEP and perform consistently in order to decrease pain and maximize ROM.    Time  3    Period  Weeks    Status  New      PT SHORT TERM GOAL #2   Title  Pt will have improved R knee AROM from 5-95 deg in order to decrease pain and maximize gait.    Time  3    Period  Weeks    Status  New      PT SHORT TERM GOAL #3   Title  Pt will have decreased joint  line edema by 4cm in order to decrease pain and maximize ROM.    Baseline  -- corrected on 08/30/17 to state "cm" as "mm" was a typo    Time  3    Period  Weeks      PT SHORT TERM GOAL #4   Title  Pt will be able to perform bil SLS for at least 10 sec with no UE in order to maximize gait and balance.    Time  3    Period  Weeks    Status  New        PT Long Term Goals - 08/30/17 1612      PT LONG TERM GOAL #1   Title  Pt will have improved R knee AROM to 0-115deg in order to maximize gait, stair ambulation, and overall return to PLOF.    Time  6    Period  Weeks    Status  New      PT LONG TERM GOAL #2   Title  Pt will have improved MMT of all muscle groups tested by 1 grade or > in order to maximize gait, balance, and overall functional strength.    Time  6    Period  Weeks    Status  New      PT LONG TERM GOAL #3   Title  Pt will be able to perform 5xSTS in 12 sec or < with proper mechanics and no UE support to demo improved functional BLE strength and balance.    Time  6    Period  Weeks    Status  New      PT LONG TERM GOAL #4   Title  Pt will be able to perform the TUG with LRAD in 10 sec or < to demo improved functional strength, balance, and maximize community ambulation.    Time  6    Period  Weeks    Status  New      PT LONG TERM GOAL #5   Title  Pt will have improved 3MWT by 151ft or > with LRAD and gait WFL in order to demo improved  functional strength, gait, and maximize community access.    Time  6    Period  Weeks    Status  New            Plan - 09/04/17 1116    Clinical Impression Statement  Continued with established POC working on mobility and decreasing edema. Pt requiring verbal and tactile cues for proper TKE as he tended to compensate with his hips/trunk flexion. Added over pressure to quad sets and heel slides this date for improved extension and flexion ROM. Updated HEP to include calf stretching and SAQ. AROM 10 to 86deg this date; PROM  10-90deg this date. Continue as planned, progressing as able.    Rehab Potential  Good    PT Frequency  3x / week    PT Duration  6 weeks    PT Treatment/Interventions  ADLs/Self Care Home Management;Cryotherapy;Electrical Stimulation;Moist Heat;Ultrasound;DME Instruction;Gait training;Stair training;Functional mobility training;Therapeutic activities;Therapeutic exercise;Balance training;Neuromuscular re-education;Patient/family education;Manual techniques;Scar mobilization;Passive range of motion;Dry needling;Energy conservation;Taping    PT Next Visit Plan  continue quad sets, heel slides wtih over pressure; continue SAQ, calf stretch, continue exercises for knee mobility progressing to tolerance; continue standing mobility work; continue manual for edema    PT Home Exercise Plan  eval: heel slides and supine HS stretch; 3/1: seated knee flexion stretch, quad set; 3/6: standing calf stretch, SAQ     Consulted and Agree with Plan of Care  Patient       Patient will benefit from skilled therapeutic intervention in order to improve the following deficits and impairments:  Abnormal gait, Decreased activity tolerance, Decreased balance, Decreased mobility, Decreased range of motion, Decreased scar mobility, Decreased strength, Difficulty walking, Hypomobility, Increased edema, Increased fascial restricitons, Increased muscle spasms, Impaired flexibility, Improper body mechanics, Pain  Visit Diagnosis: Stiffness of right knee, not elsewhere classified  Localized edema  Difficulty in walking, not elsewhere classified  Muscle weakness (generalized)     Problem List Patient Active Problem List   Diagnosis Date Noted  . Primary osteoarthritis of right knee 07/30/2017  . S/P TKR (total knee replacement) using cement, right 07/30/2017  . Colon cancer screening 10/28/2013  . Umbilical hernia without mention of obstruction or gangrene 10/28/2013       Geraldine Solar PT, DPT  [facility] [address] [place], [place], [address] Phone: [phone]   Fax:  [phone]  Name: [name] MRN: [id number] Date of Birth: 1955/08/03

## 2017-09-04 NOTE — Patient Instructions (Signed)
  STANDING CALF STRETCH  - GASTROCNEMIUS  Start by standing in front of a wall or other sturdy object. Step forward with one foot and maintain your toes on both feet to be pointed straight forward. Keep the leg behind you with a straight knee during the stretch.   Lean forward towards the wall and support yourself with your arms as you allow your front knee to bend until a gentle stretch is felt along the back of your leg that is most behind you.   Move closer or further away from the wall to control the stretch of the back leg. Also you can adjust the bend of the front knee to control the stretch as well.   Perform 2x/day, 3-5 stretches holding for 30-60 seconds   PEANUT BALL - SHORT ARC QUAD  - SAQ   Start by placing a peanut ball under your knee. Next, slowly raise your lower leg and foot as you straighten your knee. Return to starting position and repeat.  Use basketball under knee  Perform 2x/day, 2-3 sets of 10-15 reps holding for 5-10 seconds at the top

## 2017-09-05 ENCOUNTER — Telehealth (HOSPITAL_COMMUNITY): Payer: Self-pay

## 2017-09-05 NOTE — Telephone Encounter (Signed)
Caregiver states: he will not be here due to him not feeling well.

## 2017-09-06 ENCOUNTER — Ambulatory Visit (HOSPITAL_COMMUNITY): Payer: BLUE CROSS/BLUE SHIELD

## 2017-09-09 ENCOUNTER — Ambulatory Visit (HOSPITAL_COMMUNITY): Payer: BLUE CROSS/BLUE SHIELD

## 2017-09-09 DIAGNOSIS — M25661 Stiffness of right knee, not elsewhere classified: Secondary | ICD-10-CM

## 2017-09-09 DIAGNOSIS — R262 Difficulty in walking, not elsewhere classified: Secondary | ICD-10-CM

## 2017-09-09 DIAGNOSIS — M6281 Muscle weakness (generalized): Secondary | ICD-10-CM

## 2017-09-09 DIAGNOSIS — R600 Localized edema: Secondary | ICD-10-CM

## 2017-09-09 NOTE — Therapy (Signed)
[facility] [place], [place], [address] Phone: [phone]   Fax:  [phone]  Physical Therapy Treatment  Patient Details  Name: [name] MRN: [id number] Date of Birth: 01-07-1956 Referring Provider: Joni Fears, MD   Encounter Date: 09/09/2017  PT End of Session - 09/09/17 1057    Visit Number  5    Number of Visits  19    Date for PT Re-Evaluation  09/18/17    Authorization Type  [employer] Other    Authorization Time Period  08/28/17 to 10/09/17    PT Start Time  1034    PT Stop Time  1115    PT Time Calculation (min)  41 min    Activity Tolerance  Patient tolerated treatment well;No increased pain    Behavior During Therapy  WFL for tasks assessed/performed       Past Medical History:  Diagnosis Date  . Arthritis    "right knee" (07/30/2017)  . CAP (community acquired pneumonia) 2018  . Hypertension   . Hypertension   . Wears glasses     Past Surgical History:  Procedure Laterality Date  . EYE SURGERY    . JOINT REPLACEMENT    . RETINAL DETACHMENT SURGERY Left 2000s  . TOTAL KNEE ARTHROPLASTY Right 07/30/2017  . TOTAL KNEE ARTHROPLASTY Right 07/30/2017   Procedure: RIGHT TOTAL KNEE ARTHROPLASTY;  Surgeon: [name], MD;  Location: [facility];  Service: Orthopedics;  Laterality: Right;  Marland Kitchen VASECTOMY      There were no vitals filed for this visit.  Subjective Assessment - 09/09/17 1040    Subjective  Pt reports after last session with aggressive mobility, his pain was signififcantly flared up oover th eweekend, very uncomfortable. He reports his pain just got back under control, and wants today to be more genlte.     Currently in Pain?  Yes    Pain Score  4     Pain Location  -- knee    Pain Orientation  Right          [facility] Adult PT Treatment/Exercise - 09/09/17 0001      Ambulation/Gait   Assistive device  Rolling walker    Gait velocity  0.51m/s    Gait Comments  forward flexed over  RW      Knee/Hip Exercises: Stretches   Active Hamstring Stretch  5 reps;10 seconds supine LAQ with towel     Quad Stretch  3 reps;30 seconds;Right Prone: Rectus focus      Knee/Hip Exercises: Aerobic   Nustep  3 minutes, Level 1 for AA/ROM knee flexion arms and seat on #9       Knee/Hip Exercises: Standing   Heel Raises  Both;2 sets;20 reps;Limitations Very weak, mgith do better seated for full range next sessio    Heel Raises Limitations  DF movd to seated for full range     Rocker Board  2 minutes;Limitations    Rocker Board Limitations  R/L      Knee/Hip Exercises: Seated   Other Seated Knee/Hip Exercises  ankle dorsiflexion full range: 2x15      Knee/Hip Exercises: Supine   Quad Sets  Right;1 set;15 reps 3sec H    Short Arc Quad Sets  Right;10 reps;3 [employer] account executive Limitations  b-ball    Heel Slides  Right;15 reps;AROM    Heel Slides Limitations  3sec H    Knee Extension Limitations  AROM 20; PROM  20    Knee Flexion Limitations  P/ROM 88 degrees      Knee/Hip Exercises: Prone   Hamstring Curl  2 sets;10 reps 2lb for improved proprioception               PT Short Term Goals - 08/30/17 1611      PT SHORT TERM GOAL #1   Title  Pt will be independent with HEP and perform consistently in order to decrease pain and maximize ROM.    Time  3    Period  Weeks    Status  New      PT SHORT TERM GOAL #2   Title  Pt will have improved R knee AROM from 5-95 deg in order to decrease pain and maximize gait.    Time  3    Period  Weeks    Status  New      PT SHORT TERM GOAL #3   Title  Pt will have decreased joint line edema by 4cm in order to decrease pain and maximize ROM.    Baseline  -- corrected on 08/30/17 to state "cm" as "mm" was a typo    Time  3    Period  Weeks      PT SHORT TERM GOAL #4   Title  Pt will be able to perform bil SLS for at least 10 sec with no UE in order to maximize gait and balance.    Time  3    Period  Weeks    Status  New         PT Long Term Goals - 08/30/17 1612      PT LONG TERM GOAL #1   Title  Pt will have improved R knee AROM to 0-115deg in order to maximize gait, stair ambulation, and overall return to PLOF.    Time  6    Period  Weeks    Status  New      PT LONG TERM GOAL #2   Title  Pt will have improved MMT of all muscle groups tested by 1 grade or > in order to maximize gait, balance, and overall functional strength.    Time  6    Period  Weeks    Status  New      PT LONG TERM GOAL #3   Title  Pt will be able to perform 5xSTS in 12 sec or < with proper mechanics and no UE support to demo improved functional BLE strength and balance.    Time  6    Period  Weeks    Status  New      PT LONG TERM GOAL #4   Title  Pt will be able to perform the TUG with LRAD in 10 sec or < to demo improved functional strength, balance, and maximize community ambulation.    Time  6    Period  Weeks    Status  New      PT LONG TERM GOAL #5   Title  Pt will have improved 3MWT by 135ft or > with LRAD and gait WFL in order to demo improved functional strength, gait, and maximize community access.    Time  6    Period  Weeks    Status  New            Plan - 09/09/17 1058    Clinical Impression Statement  Continued to focus on mobility and basic activation of knee muscles. Pt gives  feedback that overpressure last session made his pain significantly worse over the weekend, hence today's focus is more on high volume, low to moderate intensity. Pt able to participate without increase in pain. ROM remains largely unchanged in flexion (88 degrees), with perhaps an acute decline in extension since last session (-20 degrees.) Capsular effusion remains most limiting to mobility overall, range so limited than isolated range limitations from quads/hamstrings specificially. cannot be objectively identified at this time. Pt continues to demonstrate difficulty with quads activation during quads sets, but can  preferentially activate during SAQ with greater success.     Rehab Potential  Good    PT Frequency  3x / week    PT Duration  6 weeks    PT Treatment/Interventions  ADLs/Self Care Home Management;Cryotherapy;Electrical Stimulation;Moist Heat;Ultrasound;DME Instruction;Gait training;Stair training;Functional mobility training;Therapeutic activities;Therapeutic exercise;Balance training;Neuromuscular re-education;Patient/family education;Manual techniques;Scar mobilization;Passive range of motion;Dry needling;Energy conservation;Taping    PT Next Visit Plan  no change to current program; carefull with aggressive overpressure during stretching d/t historical acute increase in edema and pain, and loss of extension ROM, although I defer to discression of evaluating therapist.     PT Home Exercise Plan  eval: heel slides and supine HS stretch; 3/1: seated knee flexion stretch, quad set; 3/6: standing calf stretch, SAQ     Consulted and Agree with Plan of Care  Patient       Patient will benefit from skilled therapeutic intervention in order to improve the following deficits and impairments:  Abnormal gait, Decreased activity tolerance, Decreased balance, Decreased mobility, Decreased range of motion, Decreased scar mobility, Decreased strength, Difficulty walking, Hypomobility, Increased edema, Increased fascial restricitons, Increased muscle spasms, Impaired flexibility, Improper body mechanics, Pain  Visit Diagnosis: Stiffness of right knee, not elsewhere classified  Localized edema  Difficulty in walking, not elsewhere classified  Muscle weakness (generalized)     Problem List Patient Active Problem List   Diagnosis Date Noted  . Primary osteoarthritis of right knee 07/30/2017  . S/P TKR (total knee replacement) using cement, right 07/30/2017  . Colon cancer screening 10/28/2013  . Umbilical hernia without mention of obstruction or gangrene 10/28/2013    11:15 AM, 09/09/17 Etta Grandchild, PT, DPT Physical Therapist - [facility] [phone] (Office)    Etta Grandchild 09/09/2017, 11:13 AM  [facility] [address] [place], [place], [address] Phone: [phone]   Fax:  [phone]  Name: [name] MRN: [id number] Date of Birth: 1956-04-06

## 2017-09-11 ENCOUNTER — Encounter (HOSPITAL_COMMUNITY): Payer: Self-pay

## 2017-09-11 ENCOUNTER — Ambulatory Visit (HOSPITAL_COMMUNITY): Payer: BLUE CROSS/BLUE SHIELD

## 2017-09-11 DIAGNOSIS — M25661 Stiffness of right knee, not elsewhere classified: Secondary | ICD-10-CM | POA: Diagnosis not present

## 2017-09-11 DIAGNOSIS — R600 Localized edema: Secondary | ICD-10-CM

## 2017-09-11 DIAGNOSIS — M6281 Muscle weakness (generalized): Secondary | ICD-10-CM

## 2017-09-11 DIAGNOSIS — R262 Difficulty in walking, not elsewhere classified: Secondary | ICD-10-CM

## 2017-09-11 NOTE — Therapy (Signed)
[facility] [place], [place], [address] Phone: [phone]   Fax:  [phone]  Physical Therapy Treatment  Patient Details  Name: [name] MRN: [id number] Date of Birth: 1956-05-14 Referring Provider: Joni Fears, MD   Encounter Date: 09/11/2017  PT End of Session - 09/11/17 1040    Visit Number  6    Number of Visits  19    Date for PT Re-Evaluation  09/18/17    Authorization Type  [employer] Other    Authorization Time Period  08/28/17 to 10/09/17    PT Start Time  1036 pt arrived a few mins late    PT Stop Time  1118    PT Time Calculation (min)  42 min    Activity Tolerance  Patient tolerated treatment well;No increased pain    Behavior During Therapy  WFL for tasks assessed/performed       Past Medical History:  Diagnosis Date  . Arthritis    "right knee" (07/30/2017)  . CAP (community acquired pneumonia) 2018  . Hypertension   . Hypertension   . Wears glasses     Past Surgical History:  Procedure Laterality Date  . EYE SURGERY    . JOINT REPLACEMENT    . RETINAL DETACHMENT SURGERY Left 2000s  . TOTAL KNEE ARTHROPLASTY Right 07/30/2017  . TOTAL KNEE ARTHROPLASTY Right 07/30/2017   Procedure: RIGHT TOTAL KNEE ARTHROPLASTY;  Surgeon: [name], MD;  Location: [facility];  Service: Orthopedics;  Laterality: Right;  Marland Kitchen VASECTOMY      There were no vitals filed for this visit.  Subjective Assessment - 09/11/17 1041    Subjective  Pt states that he is continuing to feel better since last week. He states he had to ice last night becuase his anterior knee started hurting but he thinks it was because he was up on his feet a lot yesterday.    Currently in Pain?  Yes    Pain Score  4     Pain Location  Knee    Pain Orientation  Right    Pain Descriptors / Indicators  Aching    Pain Type  Surgical pain    Pain Onset  More than a month ago    Pain Frequency  Constant    Aggravating Factors   bending    Pain Relieving Factors  keep it moving    Effect of Pain on Daily Activities  increases    Multiple Pain Sites  No         [facility] PT Assessment - 09/11/17 0001      Circumferential Edema   Circumferential - Right  42cm joint line        [facility] Adult PT Treatment/Exercise - 09/11/17 0001      Knee/Hip Exercises: Stretches   Passive Hamstring Stretch  Right;3 reps;30 seconds    Passive Hamstring Stretch Limitations  supine with rope    Quad Stretch  Right;3 reps;30 seconds    Quad Stretch Limitations  prone with rope      Knee/Hip Exercises: Aerobic   Nustep  3 minutes, Level 1 for AA/ROM knee flexion arms and seat on #9      Knee/Hip Exercises: Supine   Quad Sets  Right;1 set;15 reps    [employer] Limitations  3" holds    Short Arc [employer]  Right;2 sets;15 reps    Short Arc Quad Sets Limitations  basketball, 3-5" holds    Heel Slides  Right;15 reps;AROM    Heel Slides Limitations  3sec H    Knee Extension Limitations  17    Knee Flexion Limitations  86      Knee/Hip Exercises: Prone   Hamstring Curl  1 set;15 reps    Hamstring Curl Limitations  2# for improved proprioception; AAROM assistance from LLE      Manual Therapy   Manual Therapy  Edema management    Manual therapy comments  completed rest of treatment    Edema Management  retro massage with BLE elevated           PT Education - 09/11/17 1040    Education provided  Yes    Education Details  this PT apologized for increasing pt's knee pain last week; educated pt that we will not be as aggressive going forward    Person(s) Educated  Patient    Methods  Explanation;Demonstration    Comprehension  Verbalized understanding;Returned demonstration       PT Short Term Goals - 08/30/17 1611      PT SHORT TERM GOAL #1   Title  Pt will be independent with HEP and perform consistently in order to decrease pain and maximize ROM.    Time  3    Period  Weeks    Status  New      PT SHORT TERM GOAL #2   Title   Pt will have improved R knee AROM from 5-95 deg in order to decrease pain and maximize gait.    Time  3    Period  Weeks    Status  New      PT SHORT TERM GOAL #3   Title  Pt will have decreased joint line edema by 4cm in order to decrease pain and maximize ROM.    Baseline  -- corrected on 08/30/17 to state "cm" as "mm" was a typo    Time  3    Period  Weeks      PT SHORT TERM GOAL #4   Title  Pt will be able to perform bil SLS for at least 10 sec with no UE in order to maximize gait and balance.    Time  3    Period  Weeks    Status  New        PT Long Term Goals - 08/30/17 1612      PT LONG TERM GOAL #1   Title  Pt will have improved R knee AROM to 0-115deg in order to maximize gait, stair ambulation, and overall return to PLOF.    Time  6    Period  Weeks    Status  New      PT LONG TERM GOAL #2   Title  Pt will have improved MMT of all muscle groups tested by 1 grade or > in order to maximize gait, balance, and overall functional strength.    Time  6    Period  Weeks    Status  New      PT LONG TERM GOAL #3   Title  Pt will be able to perform 5xSTS in 12 sec or < with proper mechanics and no UE support to demo improved functional BLE strength and balance.    Time  6    Period  Weeks    Status  New      PT LONG TERM GOAL #4   Title  Pt will be able to perform the TUG with LRAD in  10 sec or < to demo improved functional strength, balance, and maximize community ambulation.    Time  6    Period  Weeks    Status  New      PT LONG TERM GOAL #5   Title  Pt will have improved 3MWT by 156ft or > with LRAD and gait WFL in order to demo improved functional strength, gait, and maximize community access.    Time  6    Period  Weeks    Status  New            Plan - 09/11/17 1120    Clinical Impression Statement  This PT began session by apologizing for aggravating his R knee pain last week and educated pt that it was clearly too aggressive for him and future sessions  will not be as aggressive; he verbalized understanding. Began on Nustep again for AA/ROM knee flexion and rest of session focused on R knee mobility. Quad sets noted to have improved quad contraction but it is still limited. Pt with significant difficulty performing knee flexion in prone as he had increased hip flexion and trunk rotation as compensation; had to use LLE to assist in flexing R knee. Ended with retro massage with BLE elevated for edema management. AROM 17-86deg this date. PT will fax of referral to pt's surgeon for thigh high compression garment due to lingering edema. Continue as planned, progressing cautiously and slowly.    Rehab Potential  Good    PT Frequency  3x / week    PT Duration  6 weeks    PT Treatment/Interventions  ADLs/Self Care Home Management;Cryotherapy;Electrical Stimulation;Moist Heat;Ultrasound;DME Instruction;Gait training;Stair training;Functional mobility training;Therapeutic activities;Therapeutic exercise;Balance training;Neuromuscular re-education;Patient/family education;Manual techniques;Scar mobilization;Passive range of motion;Dry needling;Energy conservation;Taping    PT Next Visit Plan  f/u on compression garmnet referral; carefull with aggressive overpressure during stretching d/t historical acute increase in edema and pain; focus on mobility, progressing very slowly    PT Home Exercise Plan  eval: heel slides and supine HS stretch; 3/1: seated knee flexion stretch, quad set; 3/6: standing calf stretch, SAQ     Consulted and Agree with Plan of Care  Patient       Patient will benefit from skilled therapeutic intervention in order to improve the following deficits and impairments:  Abnormal gait, Decreased activity tolerance, Decreased balance, Decreased mobility, Decreased range of motion, Decreased scar mobility, Decreased strength, Difficulty walking, Hypomobility, Increased edema, Increased fascial restricitons, Increased muscle spasms, Impaired  flexibility, Improper body mechanics, Pain  Visit Diagnosis: Stiffness of right knee, not elsewhere classified  Localized edema  Difficulty in walking, not elsewhere classified  Muscle weakness (generalized)     Problem List Patient Active Problem List   Diagnosis Date Noted  . Primary osteoarthritis of right knee 07/30/2017  . S/P TKR (total knee replacement) using cement, right 07/30/2017  . Colon cancer screening 10/28/2013  . Umbilical hernia without mention of obstruction or gangrene 10/28/2013      Geraldine Solar PT, DPT  [facility] [address] [place], [place], [address] Phone: [phone]   Fax:  [phone]  Name: CHOZEN LATULIPPE MRN: [id number] Date of Birth: 10-Jul-1955

## 2017-09-13 ENCOUNTER — Ambulatory Visit (HOSPITAL_COMMUNITY): Payer: BLUE CROSS/BLUE SHIELD

## 2017-09-13 ENCOUNTER — Encounter (HOSPITAL_COMMUNITY): Payer: Self-pay

## 2017-09-13 DIAGNOSIS — M6281 Muscle weakness (generalized): Secondary | ICD-10-CM

## 2017-09-13 DIAGNOSIS — M25661 Stiffness of right knee, not elsewhere classified: Secondary | ICD-10-CM | POA: Diagnosis not present

## 2017-09-13 DIAGNOSIS — R262 Difficulty in walking, not elsewhere classified: Secondary | ICD-10-CM

## 2017-09-13 DIAGNOSIS — R600 Localized edema: Secondary | ICD-10-CM

## 2017-09-13 NOTE — Therapy (Signed)
[place], [place], [address] Phone: [phone]   Fax:  [phone]  Physical Therapy Treatment  Patient Details  Name: [name] MRN: [id number] Date of Birth: [birth year] Referring Provider: Joni Fears, MD   Encounter Date: 09/13/2017  PT End of Session - 09/13/17 1036    Visit Number  7    Number of Visits  19    Date for PT Re-Evaluation  09/18/17    Authorization Type  [employer] Other    Authorization Time Period  08/28/17 to 10/09/17    PT Start Time  1032    PT Stop Time  1117    PT Time Calculation (min)  45 min    Activity Tolerance  Patient tolerated treatment well;No increased pain    Behavior During Therapy  WFL for tasks assessed/performed       Past Medical History:  Diagnosis Date  . Arthritis    "right knee" (07/30/2017)  . CAP (community acquired pneumonia) 2018  . Hypertension   . Hypertension   . Wears glasses     Past Surgical History:  Procedure Laterality Date  . EYE SURGERY    . JOINT REPLACEMENT    . RETINAL DETACHMENT SURGERY Left 2000s  . TOTAL KNEE ARTHROPLASTY Right 07/30/2017  . TOTAL KNEE ARTHROPLASTY Right 07/30/2017   Procedure: RIGHT TOTAL KNEE ARTHROPLASTY;  Surgeon: [name], MD;  Location: [facility];  Service: Orthopedics;  Laterality: Right;  Marland Kitchen VASECTOMY      There were no vitals filed for this visit.  Subjective Assessment - 09/13/17 1035    Subjective  Pt states that his knee is "getting there." He can tell it is a little swollen.    Currently in Pain?  No/denies    Pain Onset  More than a month ago             [facility] Adult PT Treatment/Exercise - 09/13/17 0001      Knee/Hip Exercises: Stretches   Passive Hamstring Stretch  Right;2 reps;30 seconds    Passive Hamstring Stretch Limitations  standing, 12" step    Knee: Self-Stretch to increase Flexion  Right;5 reps;10 seconds    Knee: Self-Stretch Limitations  standing, 12" step    Gastroc  Stretch  Both;2 reps;30 seconds    Gastroc Stretch Limitations  slant board      Knee/Hip Exercises: Aerobic   Nustep  4 mins, L1, AA/ROM knee flexion (arms and seat on 9)      Knee/Hip Exercises: Standing   Knee Flexion  Right;10 reps      Knee/Hip Exercises: Seated   Long Arc Quad  Right;10 reps    Other Seated Knee/Hip Exercises  ankle DF and PF x15 each      Knee/Hip Exercises: Supine   Knee Extension Limitations  16    Knee Flexion Limitations  90      Manual Therapy   Manual Therapy  Edema management;Joint mobilization    Manual therapy comments  completed rest of treatment    Edema Management  retro massage with BLE elevated    Joint Mobilization  patellar mobs all directions, especially sup/inf; AP/PA joint mobs for improved ext/flex ROM, Grade II-III 3x30" bouts each               PT Short Term Goals - 08/30/17 1611      PT SHORT TERM GOAL #1   Title  Pt will be independent with HEP and perform  consistently in order to decrease pain and maximize ROM.    Time  3    Period  Weeks    Status  New      PT SHORT TERM GOAL #2   Title  Pt will have improved R knee AROM from 5-95 deg in order to decrease pain and maximize gait.    Time  3    Period  Weeks    Status  New      PT SHORT TERM GOAL #3   Title  Pt will have decreased joint line edema by 4cm in order to decrease pain and maximize ROM.    Baseline  -- corrected on 08/30/17 to state "cm" as "mm" was a typo    Time  3    Period  Weeks      PT SHORT TERM GOAL #4   Title  Pt will be able to perform bil SLS for at least 10 sec with no UE in order to maximize gait and balance.    Time  3    Period  Weeks    Status  New        PT Long Term Goals - 08/30/17 1612      PT LONG TERM GOAL #1   Title  Pt will have improved R knee AROM to 0-115deg in order to maximize gait, stair ambulation, and overall return to PLOF.    Time  6    Period  Weeks    Status  New      PT LONG TERM GOAL #2   Title  Pt will  have improved MMT of all muscle groups tested by 1 grade or > in order to maximize gait, balance, and overall functional strength.    Time  6    Period  Weeks    Status  New      PT LONG TERM GOAL #3   Title  Pt will be able to perform 5xSTS in 12 sec or < with proper mechanics and no UE support to demo improved functional BLE strength and balance.    Time  6    Period  Weeks    Status  New      PT LONG TERM GOAL #4   Title  Pt will be able to perform the TUG with LRAD in 10 sec or < to demo improved functional strength, balance, and maximize community ambulation.    Time  6    Period  Weeks    Status  New      PT LONG TERM GOAL #5   Title  Pt will have improved 3MWT by 143ft or > with LRAD and gait WFL in order to demo improved functional strength, gait, and maximize community access.    Time  6    Period  Weeks    Status  New            Plan - 09/13/17 1119    Clinical Impression Statement  Knee mobility and edema control continued to be focus of today's session. Resumed standing mobility work this date with good progress. Added AP/PA knee joint mobs this date along with resuming patellar mobs. Ended with retro massage for edema control. AROM 16-90deg this date. Educated pt to continue HEP, icing, and elevation at home. Have not yet received signed compression garment referral so will continue to f/u with this as pt wants to use [employer]. Continue as planned, progressing slowly.    Rehab Potential  Good    PT Frequency  3x / week    PT Duration  6 weeks    PT Treatment/Interventions  ADLs/Self Care Home Management;Cryotherapy;Electrical Stimulation;Moist Heat;Ultrasound;DME Instruction;Gait training;Stair training;Functional mobility training;Therapeutic activities;Therapeutic exercise;Balance training;Neuromuscular re-education;Patient/family education;Manual techniques;Scar mobilization;Passive range of motion;Dry needling;Energy conservation;Taping    PT Next Visit  Plan  f/u on compression [employer] referral; carefull with aggressive overpressure during stretching d/t historical acute increase in edema and pain; focus on mobility, progressing very slowly    PT Home Exercise Plan  eval: heel slides and supine HS stretch; 3/1: seated knee flexion stretch, quad set; 3/6: standing calf stretch, SAQ     Consulted and Agree with Plan of Care  Patient       Patient will benefit from skilled therapeutic intervention in order to improve the following deficits and impairments:  Abnormal gait, Decreased activity tolerance, Decreased balance, Decreased mobility, Decreased range of motion, Decreased scar mobility, Decreased strength, Difficulty walking, Hypomobility, Increased edema, Increased fascial restricitons, Increased muscle spasms, Impaired flexibility, Improper body mechanics, Pain  Visit Diagnosis: Stiffness of right knee, not elsewhere classified  Localized edema  Difficulty in walking, not elsewhere classified  Muscle weakness (generalized)     Problem List Patient Active Problem List   Diagnosis Date Noted  . Primary osteoarthritis of right knee 07/30/2017  . S/P TKR (total knee replacement) using cement, right 07/30/2017  . Colon cancer screening 10/28/2013  . Umbilical hernia without mention of obstruction or gangrene 10/28/2013     Geraldine Solar PT, DPT   [facility] [address] [place], [place], [address] Phone: [phone]   Fax:  [phone]  Name: [name] MRN: [id number] Date of Birth: Mar 10, 1956

## 2017-09-16 ENCOUNTER — Encounter (HOSPITAL_COMMUNITY): Payer: Self-pay

## 2017-09-16 ENCOUNTER — Ambulatory Visit (HOSPITAL_COMMUNITY): Payer: BLUE CROSS/BLUE SHIELD

## 2017-09-16 DIAGNOSIS — R262 Difficulty in walking, not elsewhere classified: Secondary | ICD-10-CM

## 2017-09-16 DIAGNOSIS — M6281 Muscle weakness (generalized): Secondary | ICD-10-CM

## 2017-09-16 DIAGNOSIS — R600 Localized edema: Secondary | ICD-10-CM

## 2017-09-16 DIAGNOSIS — M25661 Stiffness of right knee, not elsewhere classified: Secondary | ICD-10-CM

## 2017-09-16 NOTE — Patient Instructions (Addendum)
  Prone Quad Stretch  Lie down flat on your stomach. Wrap a strap (belt, towel, dog leash) around the top of one of your feet and pull the strap across your opposite shoulder so that your knee starts to curl up to your body. Pull until a stretch is felt across the front of your thigh.     Perform 1-2x/day, 3-5 stretches holding for 30-60 seconds each

## 2017-09-16 NOTE — Therapy (Signed)
[facility], [place], [address] Phone: [phone]   Fax:  [phone]  Physical Therapy Treatment  Patient Details  Name: [name] MRN: [id number] Date of Birth: Jul 04, 1955 Referring Provider: Joni Fears, MD   Encounter Date: 09/16/2017  PT End of Session - 09/16/17 1039    Visit Number  8    Number of Visits  19    Date for PT Re-Evaluation  09/18/17    Authorization Type  [employer] Other    Authorization Time Period  08/28/17 to 10/09/17    PT Start Time  1033    PT Stop Time  1121    PT Time Calculation (min)  48 min    Activity Tolerance  Patient tolerated treatment well;No increased pain    Behavior During Therapy  WFL for tasks assessed/performed       Past Medical History:  Diagnosis Date  . Arthritis    "right knee" (07/30/2017)  . CAP (community acquired pneumonia) 2018  . Hypertension   . Hypertension   . Wears glasses     Past Surgical History:  Procedure Laterality Date  . EYE SURGERY    . JOINT REPLACEMENT    . RETINAL DETACHMENT SURGERY Left 2000s  . TOTAL KNEE ARTHROPLASTY Right 07/30/2017  . TOTAL KNEE ARTHROPLASTY Right 07/30/2017   Procedure: RIGHT TOTAL KNEE ARTHROPLASTY;  Surgeon: [name], MD;  Location: [facility];  Service: Orthopedics;  Laterality: Right;  Marland Kitchen VASECTOMY      There were no vitals filed for this visit.  Subjective Assessment - 09/16/17 1040    Subjective  Pt reports that his R knee feels stiff right in front. Reports the tightness/stiffness is about 4-5/10.    Currently in Pain?  Yes    Pain Score  5     Pain Location  Knee    Pain Orientation  Right    Pain Descriptors / Indicators  Tightness    Pain Onset  More than a month ago    Pain Frequency  Intermittent    Aggravating Factors   bending    Pain Relieving Factors  keep it moving    Effect of Pain on Daily Activities  increases    Multiple Pain Sites  No           [facility] Adult PT  Treatment/Exercise - 09/16/17 0001      Knee/Hip Exercises: Stretches   Sports administrator  Right;3 reps;30 seconds    Quad Stretch Limitations  prone with rope      Knee/Hip Exercises: Aerobic   Stationary Bike  x4 mins, seat 14, 1/2 revolutions for improved ROM      Knee/Hip Exercises: Supine   Short Arc Quad Sets  Right;15 reps    Knee Extension Limitations  15    Knee Flexion Limitations  90      Knee/Hip Exercises: Prone   Hamstring Curl  15 reps    Hamstring Curl Limitations  RLE, AAROM assistance from LLE      Manual Therapy   Manual Therapy  Edema management;Joint mobilization;Soft tissue mobilization    Manual therapy comments  completed rest of treatment    Edema Management  retro massage with BLE elevated    Joint Mobilization  patellar mobs all directions, especially sup/inf; AP/PA joint mobs for improved ext/flex ROM, Grade III 5x20-30" bouts each    Soft tissue mobilization  STM and instrument-assisted STM to proximal scar to decrease adhesions in  order to decrease pain and improve ROM           PT Education - 09/16/17 1123    Education provided  Yes    Education Details  provided compression garment referral; exercise technique; updated HEP    Person(s) Educated  Patient    Methods  Explanation;Demonstration;Handout    Comprehension  Verbalized understanding;Returned demonstration       PT Short Term Goals - 08/30/17 1611      PT SHORT TERM GOAL #1   Title  Pt will be independent with HEP and perform consistently in order to decrease pain and maximize ROM.    Time  3    Period  Weeks    Status  New      PT SHORT TERM GOAL #2   Title  Pt will have improved R knee AROM from 5-95 deg in order to decrease pain and maximize gait.    Time  3    Period  Weeks    Status  New      PT SHORT TERM GOAL #3   Title  Pt will have decreased joint line edema by 4cm in order to decrease pain and maximize ROM.    Baseline  -- corrected on 08/30/17 to state "cm" as "mm" was  a typo    Time  3    Period  Weeks      PT SHORT TERM GOAL #4   Title  Pt will be able to perform bil SLS for at least 10 sec with no UE in order to maximize gait and balance.    Time  3    Period  Weeks    Status  New        PT Long Term Goals - 08/30/17 1612      PT LONG TERM GOAL #1   Title  Pt will have improved R knee AROM to 0-115deg in order to maximize gait, stair ambulation, and overall return to PLOF.    Time  6    Period  Weeks    Status  New      PT LONG TERM GOAL #2   Title  Pt will have improved MMT of all muscle groups tested by 1 grade or > in order to maximize gait, balance, and overall functional strength.    Time  6    Period  Weeks    Status  New      PT LONG TERM GOAL #3   Title  Pt will be able to perform 5xSTS in 12 sec or < with proper mechanics and no UE support to demo improved functional BLE strength and balance.    Time  6    Period  Weeks    Status  New      PT LONG TERM GOAL #4   Title  Pt will be able to perform the TUG with LRAD in 10 sec or < to demo improved functional strength, balance, and maximize community ambulation.    Time  6    Period  Weeks    Status  New      PT LONG TERM GOAL #5   Title  Pt will have improved 3MWT by 129ft or > with LRAD and gait WFL in order to demo improved functional strength, gait, and maximize community access.    Time  6    Period  Weeks    Status  New  Plan - 09/16/17 1123    Clinical Impression Statement  Began session on recumbent bike on seat 14 for ROM; pt only able to complete 1/2 revolutions due to limitations in knee ROM. Measured pt for thigh high compression garment and provided pt signed referral; he stated he would go to [place] today. Most of today's session focused on manual techniques to address edema, scar adhesions, and joint mobility. He demo'd increased adhesions along proximal scar; instrument-assisted STM with a tool and STM were utilized to address this.  He had increased difficulty with SAQ this date. Added prone quad stretch to HEP. AROM 15-90deg today. Continue as planned, progressing slowly as able. Pt due for reassessment next visit.    Rehab Potential  Good    PT Frequency  3x / week    PT Duration  6 weeks    PT Treatment/Interventions  ADLs/Self Care Home Management;Cryotherapy;Electrical Stimulation;Moist Heat;Ultrasound;DME Instruction;Gait training;Stair training;Functional mobility training;Therapeutic activities;Therapeutic exercise;Balance training;Neuromuscular re-education;Patient/family education;Manual techniques;Scar mobilization;Passive range of motion;Dry needling;Energy conservation;Taping    PT Next Visit Plan  reassessment; f/u with pt if he went to apothecary with compression garment referral; carefull with aggressive overpressure during stretching d/t historical acute increase in edema and pain; cotninue to focus on mobility, progressing very slowly    PT Home Exercise Plan  eval: heel slides and supine HS stretch; 3/1: seated knee flexion stretch, quad set; 3/6: standing calf stretch, SAQ; 3/18: prone quad stretch with belt    Consulted and Agree with Plan of Care  Patient       Patient will benefit from skilled therapeutic intervention in order to improve the following deficits and impairments:  Abnormal gait, Decreased activity tolerance, Decreased balance, Decreased mobility, Decreased range of motion, Decreased scar mobility, Decreased strength, Difficulty walking, Hypomobility, Increased edema, Increased fascial restricitons, Increased muscle spasms, Impaired flexibility, Improper body mechanics, Pain  Visit Diagnosis: Stiffness of right knee, not elsewhere classified  Localized edema  Difficulty in walking, not elsewhere classified  Muscle weakness (generalized)     Problem List Patient Active Problem List   Diagnosis Date Noted  . Primary osteoarthritis of right knee 07/30/2017  . S/P TKR (total knee  replacement) using cement, right 07/30/2017  . Colon cancer screening 10/28/2013  . Umbilical hernia without mention of obstruction or gangrene 10/28/2013       Geraldine Solar PT, DPT  [facility] [address] [place], [place], [address] Phone: [phone]   Fax:  [phone]  Name: ROBYN NOHR MRN: [id number] Date of Birth: 1955-11-18

## 2017-09-17 ENCOUNTER — Telehealth (INDEPENDENT_AMBULATORY_CARE_PROVIDER_SITE_OTHER): Payer: Self-pay | Admitting: Orthopaedic Surgery

## 2017-09-17 NOTE — Telephone Encounter (Signed)
Patient's wife left a voicemail stating Oddis needs  "compression for swelling for his knee."

## 2017-09-17 NOTE — Telephone Encounter (Signed)
Please advise 

## 2017-09-17 NOTE — Telephone Encounter (Signed)
Should have compression stockings from the hospital-also use ice on knees

## 2017-09-18 ENCOUNTER — Encounter (HOSPITAL_COMMUNITY): Payer: Self-pay

## 2017-09-18 ENCOUNTER — Ambulatory Visit (HOSPITAL_COMMUNITY): Payer: BLUE CROSS/BLUE SHIELD

## 2017-09-18 DIAGNOSIS — M25661 Stiffness of right knee, not elsewhere classified: Secondary | ICD-10-CM

## 2017-09-18 DIAGNOSIS — R600 Localized edema: Secondary | ICD-10-CM

## 2017-09-18 DIAGNOSIS — R262 Difficulty in walking, not elsewhere classified: Secondary | ICD-10-CM

## 2017-09-18 DIAGNOSIS — M6281 Muscle weakness (generalized): Secondary | ICD-10-CM

## 2017-09-18 NOTE — Therapy (Signed)
[facility] [address] [place], [place], [address] Phone: [phone]   Fax:  [phone]  Physical Therapy Treatment/Reassessment  Patient Details  Name: [name] MRN: [id number] Date of Birth: 1956/03/19 Referring Provider: Joni Fears, MD   Encounter Date: 09/18/2017  PT End of Session - 09/18/17 1036    Visit Number  9    Number of Visits  19    Date for PT Re-Evaluation  10/09/17    Authorization Type  [employer] Other    Authorization Time Period  08/28/17 to 10/09/17    PT Start Time  1032    PT Stop Time  1114    PT Time Calculation (min)  42 min    Activity Tolerance  Patient tolerated treatment well;No increased pain    Behavior During Therapy  WFL for tasks assessed/performed       Past Medical History:  Diagnosis Date  . Arthritis    "right knee" (07/30/2017)  . CAP (community acquired pneumonia) 2018  . Hypertension   . Hypertension   . Wears glasses     Past Surgical History:  Procedure Laterality Date  . EYE SURGERY    . JOINT REPLACEMENT    . RETINAL DETACHMENT SURGERY Left 2000s  . TOTAL KNEE ARTHROPLASTY Right 07/30/2017  . TOTAL KNEE ARTHROPLASTY Right 07/30/2017   Procedure: RIGHT TOTAL KNEE ARTHROPLASTY;  Surgeon: [name], MD;  Location: [facility];  Service: Orthopedics;  Laterality: Right;  Marland Kitchen VASECTOMY      There were no vitals filed for this visit.  Subjective Assessment - 09/18/17 1036    Subjective  Pt reports he is wearing his compression hose today. He denies any pain. He states that he was really sore following the recumbent bike last session.    Currently in Pain?  No/denies    Pain Onset  More than a month ago         [facility] PT Assessment - 09/18/17 0001      Assessment   Medical Diagnosis  R TKA    Referring Provider  Joni Fears, MD    Onset Date/Surgical Date  07/30/17    Next MD Visit  09/26/17      Circumferential Edema   Circumferential - Right  42.75cm joint  line (over compression garment) was 42cm joint line      ROM / Strength   AROM / PROM / Strength  AROM;Strength      AROM   Right Knee Extension  14 was 17    Right Knee Flexion  86 was 74      Strength   Right Hip Flexion  5/5 was 4+    Right Hip Extension  3-/5 was3-    Right Hip ABduction  4-/5 was 4-    Left Hip Extension  3/5 was 3    Left Hip ABduction  4+/5 was4+    Right Knee Flexion  4+/5 was4    Right Knee Extension  5/5 through available range; was4+      Palpation   Palpation comment  continued medial and lateral joint line and VLO/VMO tenderness      Ambulation/Gait   Ambulation Distance (Feet)  418 Feet 3MWT    Assistive device  Rolling walker    Gait Comments  was 466ft with RW for 3MWT;      Balance   Balance Assessed  Yes      Static York Balance   Static York - Balance  Support  No upper extremity supported    Static York Balance -  Activities   Single Leg Stance - Right Leg;Single Leg Stance - Left Leg    Static York - Comment/# of Minutes  R:< 1 sec with 1 HHA on RW L: 1 sec with 1 fingertip assist was <1sec BLE      Standardized Balance Assessment   Standardized Balance Assessment  Five Times Sit to Stand;Timed Up and Go Test    Five times sit to stand comments   18.4sec, chair, no UE, weight shifted off RLE was 16.6sec from chair, no UE, decreased weight shift over R      Timed Up and Go Test   TUG  Normal TUG    Normal TUG (seconds)  16.6 RW    TUG Comments  was 15.9sec with RW             [facility] Adult PT Treatment/Exercise - 09/18/17 0001      Knee/Hip Exercises: Aerobic   Nustep  16mins, L2, AA/ROM knee flexion, arms and seat on 9            PT Education - 09/18/17 1520    Education provided  Yes    Education Details  reassessment findings, importance of getting ROM normalized, HEP    Person(s) Educated  Patient;Child(ren)    Methods  Explanation;Demonstration;Handout    Comprehension  Verbalized  understanding;Returned demonstration       PT Short Term Goals - 09/18/17 1037      PT SHORT TERM GOAL #1   Title  Pt will be independent with HEP and perform consistently in order to decrease pain and maximize ROM.    Baseline  3/20: reports compliance with HEP 2x daily    Time  3    Period  Weeks    Status  Achieved      PT SHORT TERM GOAL #2   Title  Pt will have improved R knee AROM from 5-95 deg in order to decrease pain and maximize gait.    Baseline  3/20: 14-86deg    Time  3    Period  Weeks    Status  On-going      PT SHORT TERM GOAL #3   Title  Pt will have decreased joint line edema by 4cm in order to decrease pain and maximize ROM.    Baseline  3/20: 42.75cm at joint line (measured over compresison garment)    Time  3    Period  Weeks    Status  On-going      PT SHORT TERM GOAL #4   Title  Pt will be able to perform bil SLS for at least 10 sec with no UE in order to maximize gait and balance.    Baseline  3/20: 1 sec on R with 1HHA, 1 sec on L with 1 fingertip assist    Time  3    Period  Weeks    Status  On-going        PT Long Term Goals - 09/18/17 1037      PT LONG TERM GOAL #1   Title  Pt will have improved R knee AROM to 0-115deg in order to maximize gait, stair ambulation, and overall return to PLOF.    Baseline  3/20: 14-86deg    Time  6    Period  Weeks    Status  On-going      PT LONG TERM GOAL #2   Title  Pt will have improved MMT of all muscle groups tested by 1 grade or > in order to maximize gait, balance, and overall functional strength.    Baseline  3/20: see MMT    Time  6    Period  Weeks    Status  On-going      PT LONG TERM GOAL #3   Title  Pt will be able to perform 5xSTS in 12 sec or < with proper mechanics and no UE support to demo improved functional BLE strength and balance.    Baseline  3/20: 18.8sec, RLE extended    Time  6    Period  Weeks    Status  On-going      PT LONG TERM GOAL #4   Title  Pt will be able to  perform the TUG with LRAD in 10 sec or < to demo improved functional strength, balance, and maximize community ambulation.    Baseline  3/20: 16.6sec, RW    Time  6    Period  Weeks    Status  On-going      PT LONG TERM GOAL #5   Title  Pt will have improved 3MWT by 125ft or > with LRAD and gait WFL in order to demo improved functional strength, gait, and maximize community access.    Baseline  3/20: 441ft RW, gait deviations continued    Time  6    Period  Weeks    Status  On-going            Plan - 09/18/17 1530    Clinical Impression Statement  PT reassessed pt's goals and outcome measures this date. He has made progress towards goals as illustrated above. He continues to be deficient in AROM, MMT, and edema. His swelling has remained relatively unchanged (was 42.75cm this date at joint line, but was measuring over compression garment as he had it donned and did not wish to pull it down for measurement purposes). His knee MMT improved but his proximal hip musculature is still significantly weak and his AROM was 14-86deg this date (was 17-74deg at evaluation 3 weeks ago). Pt received his thigh high compression garment yesterday and reports compliance in wearing it; this should facilitate decreasing his joint line edema and thus improving overall ROM. Pt continues with gait deviations with RW as illustrated above. PT educated pt on the importance of regaining his ROM and decreasing his edema. During previous bouts of manual therapy, pt's tibiofemoral joint is noted to be moving well, especially in PA direction, so PT suspects pt's ROM deficiencies is due to lingering edema. Will continue to follow and address accordingly. Pt needs continued skilled PT intervention to address remaining deficits in order to decreased edema, improve ROM, and maximize return to PLOF.    Rehab Potential  Good    PT Frequency  3x / week    PT Duration  6 weeks    PT Treatment/Interventions  ADLs/Self Care Home  Management;Cryotherapy;Electrical Stimulation;Moist Heat;Ultrasound;DME Instruction;Gait training;Stair training;Functional mobility training;Therapeutic activities;Therapeutic exercise;Balance training;Neuromuscular re-education;Patient/family education;Manual techniques;Scar mobilization;Passive range of motion;Dry needling;Energy conservation;Taping    PT Next Visit Plan  carefull with aggressive overpressure during stretching d/t historical acute increase in edema and pain;  continue to focus on mobility, progressing very slowly; trial very light contract relax and manual quad stretch in prone; continue SAQ and TKE    PT Home Exercise Plan  eval: heel slides and supine HS stretch; 3/1: seated knee flexion stretch, quad set; 3/6: York  calf stretch, SAQ; 3/18: prone quad stretch with belt    Consulted and Agree with Plan of Care  Patient       Patient will benefit from skilled therapeutic intervention in order to improve the following deficits and impairments:  Abnormal gait, Decreased activity tolerance, Decreased balance, Decreased mobility, Decreased range of motion, Decreased scar mobility, Decreased strength, Difficulty walking, Hypomobility, Increased edema, Increased fascial restricitons, Increased muscle spasms, Impaired flexibility, Improper body mechanics, Pain  Visit Diagnosis: Stiffness of right knee, not elsewhere classified  Localized edema  Difficulty in walking, not elsewhere classified  Muscle weakness (generalized)     Problem List Patient Active Problem List   Diagnosis Date Noted  . Primary osteoarthritis of right knee 07/30/2017  . S/P TKR (total knee replacement) using cement, right 07/30/2017  . Colon cancer screening 10/28/2013  . Umbilical hernia without mention of obstruction or gangrene 10/28/2013       Geraldine Solar PT, DPT  [facility] [address] [place], [place], [address] Phone: [phone]   Fax:   [phone]  Name: [name] MRN: [id number] Date of Birth: [birth year]

## 2017-09-18 NOTE — Patient Instructions (Signed)
  Dorsiflexion self- mobilization  Place affected foot on step and other on the ground. With the foot placed firmly on the step, drive the knee forward out over the foot.   Perform 2x/day, 1-2 sets of 10-15 reps holding for 10-15 seconds each at end range knee bend

## 2017-09-19 NOTE — Telephone Encounter (Signed)
Left message to use ice and to look through things from the hospital because they should have given him a pair of compression socks from the hospital

## 2017-09-20 ENCOUNTER — Ambulatory Visit (HOSPITAL_COMMUNITY): Payer: BLUE CROSS/BLUE SHIELD

## 2017-09-20 ENCOUNTER — Encounter (HOSPITAL_COMMUNITY): Payer: Self-pay

## 2017-09-20 DIAGNOSIS — M6281 Muscle weakness (generalized): Secondary | ICD-10-CM

## 2017-09-20 DIAGNOSIS — R600 Localized edema: Secondary | ICD-10-CM

## 2017-09-20 DIAGNOSIS — M25661 Stiffness of right knee, not elsewhere classified: Secondary | ICD-10-CM | POA: Diagnosis not present

## 2017-09-20 DIAGNOSIS — R262 Difficulty in walking, not elsewhere classified: Secondary | ICD-10-CM

## 2017-09-20 NOTE — Therapy (Signed)
[facility] [place], [place], [address] Phone: [phone]   Fax:  [phone]  Physical Therapy Treatment  Patient Details  Name: [name] MRN: [id number] Date of Birth: Jun 23, 1956 Referring Provider: Joni Fears, MD   Encounter Date: 09/20/2017  PT End of Session - 09/20/17 1208    Visit Number  10    Number of Visits  19    Date for PT Re-Evaluation  10/09/17    Authorization Type  [employer] Other    Authorization Time Period  08/28/17 to 10/09/17    PT Start Time  1035 4' on bike, no charge    PT Stop Time  1119    PT Time Calculation (min)  44 min    Activity Tolerance  Patient tolerated treatment well;No increased pain    Behavior During Therapy  WFL for tasks assessed/performed       Past Medical History:  Diagnosis Date  . Arthritis    "right knee" (07/30/2017)  . CAP (community acquired pneumonia) 2018  . Hypertension   . Hypertension   . Wears glasses     Past Surgical History:  Procedure Laterality Date  . EYE SURGERY    . JOINT REPLACEMENT    . RETINAL DETACHMENT SURGERY Left 2000s  . TOTAL KNEE ARTHROPLASTY Right 07/30/2017  . TOTAL KNEE ARTHROPLASTY Right 07/30/2017   Procedure: RIGHT TOTAL KNEE ARTHROPLASTY;  Surgeon: [name], MD;  Location: [facility];  Service: Orthopedics;  Laterality: Right;  Marland Kitchen VASECTOMY      There were no vitals filed for this visit.  Subjective Assessment - 09/20/17 1035    Subjective  Pt stated he is feeling good today, no reports of pain.  Wearing compression hose today.      Patient Stated Goals  get back close to normal    Currently in Pain?  No/denies         [facility] PT Assessment - 09/20/17 0001      Assessment   Medical Diagnosis  R TKA    Referring Provider  Joni Fears, MD    Onset Date/Surgical Date  07/30/17    Next MD Visit  10/03/17    Prior Therapy  [facility], d/c last week            No data recorded       [facility] Adult PT  Treatment/Exercise - 09/20/17 0001      Knee/Hip Exercises: Stretches   Active Hamstring Stretch  3 reps;30 seconds supine with rope    Quad Stretch  Right;3 reps;30 seconds    Quad Stretch Limitations  prone with rope    Gastroc Stretch  Both;2 reps;30 seconds    Gastroc Stretch Limitations  slant board      Knee/Hip Exercises: Aerobic   Stationary Bike  x4 mins, seat 9, 1/2 revolutions for improved ROM    Nustep  9mins, L2, AA/ROM knee flexion, arms and seat on 9      Knee/Hip Exercises: Standing   Terminal Knee Extension Limitations  10x10" GTB      Knee/Hip Exercises: Supine   Quad Sets  Right;1 set;15 reps    Quad Sets Limitations  cueing to relax gluteal contraction and improve quad sets    Short Arc [employer]  Right;15 reps    Short Arc Quad Sets Limitations  used traction bolster under knee    Heel Slides  Right;15 reps;AROM    Knee Extension  AROM  Knee Extension Limitations  15    Knee Flexion  AROM    Knee Flexion Limitations  90      Knee/Hip Exercises: Prone   Prone Knee Hang  2 minutes manual to gastroc/hamstrings      Manual Therapy   Manual Therapy  Joint mobilization;Soft tissue mobilization;Myofascial release    Manual therapy comments  completed rest of treatment    Joint Mobilization  patellar mobs all directions, especially sup/inf; AP/PA joint mobs for improved ext/flex ROM, Grade III 5x20-30" bouts each    Soft tissue mobilization  STM and instrument assisted to hamstrings and gastroc supine position with knee flexed    Myofascial Release  scar tissue massage               PT Short Term Goals - 09/18/17 1037      PT SHORT TERM GOAL #1   Title  Pt will be independent with HEP and perform consistently in order to decrease pain and maximize ROM.    Baseline  3/20: reports compliance with HEP 2x daily    Time  3    Period  Weeks    Status  Achieved      PT SHORT TERM GOAL #2   Title  Pt will have improved R knee AROM from 5-95 deg in order  to decrease pain and maximize gait.    Baseline  3/20: 14-86deg    Time  3    Period  Weeks    Status  On-going      PT SHORT TERM GOAL #3   Title  Pt will have decreased joint line edema by 4cm in order to decrease pain and maximize ROM.    Baseline  3/20: 42.75cm at joint line (measured over compresison garment)    Time  3    Period  Weeks    Status  On-going      PT SHORT TERM GOAL #4   Title  Pt will be able to perform bil SLS for at least 10 sec with no UE in order to maximize gait and balance.    Baseline  3/20: 1 sec on R with 1HHA, 1 sec on L with 1 fingertip assist    Time  3    Period  Weeks    Status  On-going        PT Long Term Goals - 09/18/17 1037      PT LONG TERM GOAL #1   Title  Pt will have improved R knee AROM to 0-115deg in order to maximize gait, stair ambulation, and overall return to PLOF.    Baseline  3/20: 14-86deg    Time  6    Period  Weeks    Status  On-going      PT LONG TERM GOAL #2   Title  Pt will have improved MMT of all muscle groups tested by 1 grade or > in order to maximize gait, balance, and overall functional strength.    Baseline  3/20: see MMT    Time  6    Period  Weeks    Status  On-going      PT LONG TERM GOAL #3   Title  Pt will be able to perform 5xSTS in 12 sec or < with proper mechanics and no UE support to demo improved functional BLE strength and balance.    Baseline  3/20: 18.8sec, RLE extended    Time  6    Period  Weeks  Status  On-going      PT LONG TERM GOAL #4   Title  Pt will be able to perform the TUG with LRAD in 10 sec or < to demo improved functional strength, balance, and maximize community ambulation.    Baseline  3/20: 16.6sec, RW    Time  6    Period  Weeks    Status  On-going      PT LONG TERM GOAL #5   Title  Pt will have improved 3MWT by 182ft or > with LRAD and gait WFL in order to demo improved functional strength, gait, and maximize community access.    Baseline  3/20: 468ft RW, gait  deviations continued    Time  6    Period  Weeks    Status  On-going            Plan - 09/20/17 1209    Clinical Impression Statement  Moderate cueing to improve quadricep activation and reduce gluteal contraction during quad sets, might benefit from estim to improve awareness of proper musculature activaiotn next sessoin.  Continued session focus wiht knee mobility wiht manual technqiues to address tightness in hamstrings/gastrocnemius and myofascial technqiues to address adhesions on scar tissue.  AROM at EOS 15-90 degrees.  Pt wore compression hose to assist iwht edema control, still present proximal knee though reduced with hose assistance.     Rehab Potential  Good    PT Frequency  3x / week    PT Duration  6 weeks    PT Treatment/Interventions  ADLs/Self Care Home Management;Cryotherapy;Electrical Stimulation;Moist Heat;Ultrasound;DME Instruction;Gait training;Stair training;Functional mobility training;Therapeutic activities;Therapeutic exercise;Balance training;Neuromuscular re-education;Patient/family education;Manual techniques;Scar mobilization;Passive range of motion;Dry needling;Energy conservation;Taping    PT Next Visit Plan  Pt. may benefit from estim 1-2 session to improve awareness of quad contraction and reduce gluteal activation.  carefull with aggressive overpressure during stretching d/t historical acute increase in edema and pain;  continue to focus on mobility, progressing very slowly; trial very light contract relax and manual quad stretch in prone; continue SAQ and TKE    PT Home Exercise Plan  eval: heel slides and supine HS stretch; 3/1: seated knee flexion stretch, quad set; 3/6: standing calf stretch, SAQ; 3/18: prone quad stretch with belt       Patient will benefit from skilled therapeutic intervention in order to improve the following deficits and impairments:  Abnormal gait, Decreased activity tolerance, Decreased balance, Decreased mobility, Decreased range  of motion, Decreased scar mobility, Decreased strength, Difficulty walking, Hypomobility, Increased edema, Increased fascial restricitons, Increased muscle spasms, Impaired flexibility, Improper body mechanics, Pain  Visit Diagnosis: Stiffness of right knee, not elsewhere classified  Localized edema  Difficulty in walking, not elsewhere classified  Muscle weakness (generalized)     Problem List Patient Active Problem List   Diagnosis Date Noted  . Primary osteoarthritis of right knee 07/30/2017  . S/P TKR (total knee replacement) using cement, right 07/30/2017  . Colon cancer screening 10/28/2013  . Umbilical hernia without mention of obstruction or gangrene 10/28/2013   Ihor Austin, LPTA; [place]  Aldona Lento 09/20/2017, 12:15 PM  [facility], [place], [address] Phone: [phone]   Fax:  [phone]  Name: EVANS LEVEE MRN: [id number] Date of Birth: [birth year]

## 2017-09-23 ENCOUNTER — Other Ambulatory Visit: Payer: Self-pay

## 2017-09-23 ENCOUNTER — Ambulatory Visit (HOSPITAL_COMMUNITY): Payer: BLUE CROSS/BLUE SHIELD

## 2017-09-23 ENCOUNTER — Encounter (HOSPITAL_COMMUNITY): Payer: Self-pay

## 2017-09-23 DIAGNOSIS — M25661 Stiffness of right knee, not elsewhere classified: Secondary | ICD-10-CM | POA: Diagnosis not present

## 2017-09-23 DIAGNOSIS — R262 Difficulty in walking, not elsewhere classified: Secondary | ICD-10-CM

## 2017-09-23 DIAGNOSIS — R600 Localized edema: Secondary | ICD-10-CM

## 2017-09-23 DIAGNOSIS — M6281 Muscle weakness (generalized): Secondary | ICD-10-CM

## 2017-09-23 NOTE — Therapy (Signed)
[name], [place], [address] Phone: [phone]   Fax:  [phone]  Physical Therapy Treatment  Patient Details  Name: [name] MRN: [id number] Date of Birth: May 10, 1956 Referring Provider: Joni Fears, MD   Encounter Date: 09/23/2017  PT End of Session - 09/23/17 1049    Visit Number  11    Number of Visits  19    Date for PT Re-Evaluation  10/09/17    Authorization Type  [employer] Other    Authorization Time Period  08/28/17 to 10/09/17    PT Start Time  1041 patient late to therapy    PT Stop Time  1119    PT Time Calculation (min)  38 min    Activity Tolerance  Patient tolerated treatment well;No increased pain    Behavior During Therapy  WFL for tasks assessed/performed       Past Medical History:  Diagnosis Date  . Arthritis    "right knee" (07/30/2017)  . CAP (community acquired pneumonia) 2018  . Hypertension   . Hypertension   . Wears glasses     Past Surgical History:  Procedure Laterality Date  . EYE SURGERY    . JOINT REPLACEMENT    . RETINAL DETACHMENT SURGERY Left 2000s  . TOTAL KNEE ARTHROPLASTY Right 07/30/2017  . TOTAL KNEE ARTHROPLASTY Right 07/30/2017   Procedure: RIGHT TOTAL KNEE ARTHROPLASTY;  Surgeon: [name], MD;  Location: [facility];  Service: Orthopedics;  Laterality: Right;  Marland Kitchen VASECTOMY      There were no vitals filed for this visit.  Subjective Assessment - 09/23/17 1505    Subjective  Patient reports he is diong his HEP and performing his stretches every night before bed. He denies pain today.    Patient Stated Goals  get back close to normal    Currently in Pain?  No/denies        No data recorded    [facility] Adult PT Treatment/Exercise - 09/23/17 0001      Knee/Hip Exercises: Stretches   Active Hamstring Stretch  3 reps;30 seconds;Limitations    Active Hamstring Stretch Limitations  supine with rope    Quad Stretch  Right;3 reps;30 seconds    Quad Stretch Limitations  prone with rope      Knee/Hip Exercises: Aerobic   Nustep  14mins, L2, AA/ROM knee flexion, No UE, seat on 9 patient complained of too much stretch with seat 10      Knee/Hip Exercises: Supine   Quad Sets  Right;1 set;15 reps    Short Arc Quad Sets  Right;15 reps    Heel Slides  Right;15 reps;AROM;2 sets      Manual Therapy   Manual Therapy  Joint mobilization    Manual therapy comments  completed rest of treatment    Joint Mobilization  patellar mobs all directions, especially sup/inf; AP/PA joint mobs for improved ext/flex ROM, Grade III 3x30-45" bouts each, AP/PA glide to tibfem joint for flexion/extension, 3x 30-45" for grade II        PT Education - 09/23/17 1049    Education provided  Yes    Education Details  Edcuated on improtance of HEP participation to improve strength and ROM. Educated on proper stretching technique and feelign tightness which should be tolerabel and nott painful however a noticable stretch at the end of his ROM.    Person(s) Educated  Patient    Methods  Explanation;Verbal cues;Tactile cues    Comprehension  Verbalized understanding       PT Short Term Goals - 09/18/17 1037      PT SHORT TERM GOAL #1   Title  Pt will be independent with HEP and perform consistently in order to decrease pain and maximize ROM.    Baseline  3/20: reports compliance with HEP 2x daily    Time  3    Period  Weeks    Status  Achieved      PT SHORT TERM GOAL #2   Title  Pt will have improved R knee AROM from 5-95 deg in order to decrease pain and maximize gait.    Baseline  3/20: 14-86deg    Time  3    Period  Weeks    Status  On-going      PT SHORT TERM GOAL #3   Title  Pt will have decreased joint line edema by 4cm in order to decrease pain and maximize ROM.    Baseline  3/20: 42.75cm at joint line (measured over compresison garment)    Time  3    Period  Weeks    Status  On-going      PT SHORT TERM GOAL #4   Title  Pt will be able to  perform bil SLS for at least 10 sec with no UE in order to maximize gait and balance.    Baseline  3/20: 1 sec on R with 1HHA, 1 sec on L with 1 fingertip assist    Time  3    Period  Weeks    Status  On-going        PT Long Term Goals - 09/18/17 1037      PT LONG TERM GOAL #1   Title  Pt will have improved R knee AROM to 0-115deg in order to maximize gait, stair ambulation, and overall return to PLOF.    Baseline  3/20: 14-86deg    Time  6    Period  Weeks    Status  On-going      PT LONG TERM GOAL #2   Title  Pt will have improved MMT of all muscle groups tested by 1 grade or > in order to maximize gait, balance, and overall functional strength.    Baseline  3/20: see MMT    Time  6    Period  Weeks    Status  On-going      PT LONG TERM GOAL #3   Title  Pt will be able to perform 5xSTS in 12 sec or < with proper mechanics and no UE support to demo improved functional BLE strength and balance.    Baseline  3/20: 18.8sec, RLE extended    Time  6    Period  Weeks    Status  On-going      PT LONG TERM GOAL #4   Title  Pt will be able to perform the TUG with LRAD in 10 sec or < to demo improved functional strength, balance, and maximize community ambulation.    Baseline  3/20: 16.6sec, RW    Time  6    Period  Weeks    Status  On-going      PT LONG TERM GOAL #5   Title  Pt will have improved 3MWT by 13ft or > with LRAD and gait WFL in order to demo improved functional strength, gait, and maximize community access.    Baseline  3/20: 490ft RW, gait deviations continued    Time  6    Period  Weeks    Status  On-going         Plan - 09/23/17 1050    Clinical Impression Statement  Patient continues to require verbal/tactile cuing to perform exercises with proper form and to full end ROM. He demonstrated poor technique with supine hamstring stretch/prone quad stretch and required manual assist/cues to stretch at end ROM. He continues to wear compression hose to assist with  edema control, which appears to be improving. He has a follow up with his surgeon on April 6th and will benefit from early re-assessment to send to physician. He will continue to benefit from skilled PT services to address ROM deficits and ongoing gait and mobility impairments.     Rehab Potential  Good    PT Frequency  3x / week    PT Duration  6 weeks    PT Treatment/Interventions  ADLs/Self Care Home Management;Cryotherapy;Electrical Stimulation;Moist Heat;Ultrasound;DME Instruction;Gait training;Stair training;Functional mobility training;Therapeutic activities;Therapeutic exercise;Balance training;Neuromuscular re-education;Patient/family education;Manual techniques;Scar mobilization;Passive range of motion;Dry needling;Energy conservation;Taping    PT Next Visit Plan  Pt. may benefit from estim 1-2 session to improve awareness of quad contraction and reduce gluteal activation.  carefull with aggressive overpressure during stretching d/t historical acute increase in edema and pain;  continue to focus on mobility, progressing very slowly; trial very light contract relax and manual quad stretch in prone; continue SAQ and TKE    PT Home Exercise Plan  eval: heel slides and supine HS stretch; 3/1: seated knee flexion stretch, quad set; 3/6: standing calf stretch, SAQ; 3/18: prone quad stretch with belt    Consulted and Agree with Plan of Care  Patient       Patient will benefit from skilled therapeutic intervention in order to improve the following deficits and impairments:  Abnormal gait, Decreased activity tolerance, Decreased balance, Decreased mobility, Decreased range of motion, Decreased scar mobility, Decreased strength, Difficulty walking, Hypomobility, Increased edema, Increased fascial restricitons, Increased muscle spasms, Impaired flexibility, Improper body mechanics, Pain  Visit Diagnosis: Stiffness of right knee, not elsewhere classified  Localized edema  Difficulty in walking, not  elsewhere classified  Muscle weakness (generalized)     Problem List Patient Active Problem List   Diagnosis Date Noted  . Primary osteoarthritis of right knee 07/30/2017  . S/P TKR (total knee replacement) using cement, right 07/30/2017  . Colon cancer screening 10/28/2013  . Umbilical hernia without mention of obstruction or gangrene 10/28/2013    Kipp Brood, PT, DPT Physical Therapist with [facility]  09/23/2017 3:15 PM    [place], [place], [address] Phone: [phone]   Fax:  [phone]  Name: [name] MRN: [id number] Date of Birth: 02-Jun-1956

## 2017-09-25 ENCOUNTER — Encounter (HOSPITAL_COMMUNITY): Payer: Self-pay

## 2017-09-25 ENCOUNTER — Ambulatory Visit (HOSPITAL_COMMUNITY): Payer: BLUE CROSS/BLUE SHIELD

## 2017-09-25 DIAGNOSIS — R262 Difficulty in walking, not elsewhere classified: Secondary | ICD-10-CM

## 2017-09-25 DIAGNOSIS — R600 Localized edema: Secondary | ICD-10-CM

## 2017-09-25 DIAGNOSIS — M25661 Stiffness of right knee, not elsewhere classified: Secondary | ICD-10-CM | POA: Diagnosis not present

## 2017-09-25 DIAGNOSIS — M6281 Muscle weakness (generalized): Secondary | ICD-10-CM

## 2017-09-25 NOTE — Therapy (Signed)
[facility] [address] [place], [place], [address] Phone: [phone]   Fax:  [phone]  Physical Therapy Treatment  Patient Details  Name: [name] MRN: [id number] Date of Birth: 1956-03-27 Referring Provider: Joni Fears, MD   Encounter Date: 09/25/2017  PT End of Session - 09/25/17 1033    Visit Number  12    Number of Visits  19    Date for PT Re-Evaluation  10/09/17    Authorization Type  [employer]BS Other    Authorization Time Period  08/28/17 to 10/09/17    PT Start Time  1030    PT Stop Time  1119    PT Time Calculation (min)  49 min    Activity Tolerance  Patient tolerated treatment well;No increased pain    Behavior During Therapy  WFL for tasks assessed/performed       Past Medical History:  Diagnosis Date  . Arthritis    "right knee" (07/30/2017)  . CAP (community acquired pneumonia) 2018  . Hypertension   . Hypertension   . Wears glasses     Past Surgical History:  Procedure Laterality Date  . EYE SURGERY    . JOINT REPLACEMENT    . RETINAL DETACHMENT SURGERY Left 2000s  . TOTAL KNEE ARTHROPLASTY Right 07/30/2017  . TOTAL KNEE ARTHROPLASTY Right 07/30/2017   Procedure: RIGHT TOTAL KNEE ARTHROPLASTY;  Surgeon: [name], MD;  Location: [facility];  Service: Orthopedics;  Laterality: Right;  Marland Kitchen VASECTOMY      There were no vitals filed for this visit.  Subjective Assessment - 09/25/17 1033    Subjective  Pt reports that he is feeling well this morning. He denies any knee pain. His compression garment was donned inside out and was noted to be around his ankle when he was on the Nustep, which he was unaware of.    Patient Stated Goals  get back close to normal    Currently in Pain?  No/denies         [facility] PT Assessment - 09/25/17 0001      Circumferential Edema   Circumferential - Right  42cm joint line           [facility] Adult PT Treatment/Exercise - 09/25/17 0001      Knee/Hip Exercises:  Aerobic   Nustep  15mins, L2, AA/ROM knee flexion, No UE, seat on 9      Knee/Hip Exercises: Supine   Quad Sets  Right    Quad Sets Limitations  x6 mins with [place] estim to R quad    Short Arc [employer]  Right    Short Arc [employer] Limitations  x6 mins with [place] estim to R quad    Knee Extension Limitations  16    Knee Flexion Limitations  90      Modalities   Modalities  Electrical engineer Stimulation Location  R VMO and rectus femoris (distal VMO and approximately middle rectus femoris)    Electrical Stimulation Action  muscle contraction    Electrical Stimulation Parameters  Russian; 10/10, 65mA    Electrical Stimulation Goals  Strength;Neuromuscular facilitation      Manual Therapy   Manual Therapy  Edema management;Joint mobilization;Soft tissue mobilization;Myofascial release    Manual therapy comments  completed rest of treatment    Edema Management  retro massage with BLE elevated    Joint Mobilization  patellar mobs all directions, especially sup/inf;  AP/PA joint mobs for improved ext/flex ROM, Grade III 3x30-45" bouts each    Soft tissue mobilization  STM to distal VLO and VMO    Myofascial Release  scar tissue massage             PT Education - 09/25/17 1126    Education provided  Yes    Education Details  benefits of Russian stim; could potentially have increased muscular fatigue following; proper quad contraction    Person(s) Educated  Patient    Methods  Explanation;Demonstration;Tactile cues;Verbal cues    Comprehension  Verbalized understanding;Need further instruction       PT Short Term Goals - 09/18/17 1037      PT SHORT TERM GOAL #1   Title  Pt will be independent with HEP and perform consistently in order to decrease pain and maximize ROM.    Baseline  3/20: reports compliance with HEP 2x daily    Time  3    Period  Weeks    Status  Achieved      PT SHORT TERM GOAL #2   Title  Pt will have  improved R knee AROM from 5-95 deg in order to decrease pain and maximize gait.    Baseline  3/20: 14-86deg    Time  3    Period  Weeks    Status  On-going      PT SHORT TERM GOAL #3   Title  Pt will have decreased joint line edema by 4cm in order to decrease pain and maximize ROM.    Baseline  3/20: 42.75cm at joint line (measured over compresison garment)    Time  3    Period  Weeks    Status  On-going      PT SHORT TERM GOAL #4   Title  Pt will be able to perform bil SLS for at least 10 sec with no UE in order to maximize gait and balance.    Baseline  3/20: 1 sec on R with 1HHA, 1 sec on L with 1 fingertip assist    Time  3    Period  Weeks    Status  On-going        PT Long Term Goals - 09/18/17 1037      PT LONG TERM GOAL #1   Title  Pt will have improved R knee AROM to 0-115deg in order to maximize gait, stair ambulation, and overall return to PLOF.    Baseline  3/20: 14-86deg    Time  6    Period  Weeks    Status  On-going      PT LONG TERM GOAL #2   Title  Pt will have improved MMT of all muscle groups tested by 1 grade or > in order to maximize gait, balance, and overall functional strength.    Baseline  3/20: see MMT    Time  6    Period  Weeks    Status  On-going      PT LONG TERM GOAL #3   Title  Pt will be able to perform 5xSTS in 12 sec or < with proper mechanics and no UE support to demo improved functional BLE strength and balance.    Baseline  3/20: 18.8sec, RLE extended    Time  6    Period  Weeks    Status  On-going      PT LONG TERM GOAL #4   Title  Pt will be able to perform the  TUG with LRAD in 10 sec or < to demo improved functional strength, balance, and maximize community ambulation.    Baseline  3/20: 16.6sec, RW    Time  6    Period  Weeks    Status  On-going      PT LONG TERM GOAL #5   Title  Pt will have improved 3MWT by 166ft or > with LRAD and gait WFL in order to demo improved functional strength, gait, and maximize community  access.    Baseline  3/20: 451ft RW, gait deviations continued    Time  6    Period  Weeks    Status  On-going            Plan - 09/25/17 1127    Clinical Impression Statement  Pt presents to therapy with his compression garment down around his ankle because he had unknowingly donned it inside out. PT assisted pt in correcting at EOS. Introduced pt to [place] to R quad for improved quad contraction, proprioception, and overall improved extension ROM. Good quad contraction elicited with estim, but he continued to require max cueing for proper technique with quad sets to decrease gluteal compensation and with SAQ to decreased hip flexion compensation. Performed x6 mins of quad sets with stim and x6 mins of SAQ with estim. Ended session with manual for edema control and joint mobs for improved ROM. Pt's AROM 16 to 90deg this date. Pt's circumferential edema at joint line 42cm this date so a slight improvement from his last measurement.     Rehab Potential  Good    PT Frequency  3x / week    PT Duration  6 weeks    PT Treatment/Interventions  ADLs/Self Care Home Management;Cryotherapy;Electrical Stimulation;Moist Heat;Ultrasound;DME Instruction;Gait training;Stair training;Functional mobility training;Therapeutic activities;Therapeutic exercise;Balance training;Neuromuscular re-education;Patient/family education;Manual techniques;Scar mobilization;Passive range of motion;Dry needling;Energy conservation;Taping    PT Next Visit Plan  continue [place] for awareness of quad contraction and reduce gluteal activation.  carefull with aggressive overpressure during stretching d/t historical acute increase in edema and pain;  continue to focus on mobility, progressing very slowly; trial very light contract relax and manual quad stretch in prone; continue SAQ and TKE    PT Home Exercise Plan  eval: heel slides and supine HS stretch; 3/1: seated knee flexion stretch, quad set; 3/6: standing calf  stretch, SAQ; 3/18: prone quad stretch with belt    Consulted and Agree with Plan of Care  Patient       Patient will benefit from skilled therapeutic intervention in order to improve the following deficits and impairments:  Abnormal gait, Decreased activity tolerance, Decreased balance, Decreased mobility, Decreased range of motion, Decreased scar mobility, Decreased strength, Difficulty walking, Hypomobility, Increased edema, Increased fascial restricitons, Increased muscle spasms, Impaired flexibility, Improper body mechanics, Pain  Visit Diagnosis: Stiffness of right knee, not elsewhere classified  Localized edema  Difficulty in walking, not elsewhere classified  Muscle weakness (generalized)     Problem List Patient Active Problem List   Diagnosis Date Noted  . Primary osteoarthritis of right knee 07/30/2017  . S/P TKR (total knee replacement) using cement, right 07/30/2017  . Colon cancer screening 10/28/2013  . Umbilical hernia without mention of obstruction or gangrene 10/28/2013      Geraldine Solar PT, DPT  [facility] [address] [place], [place], [address] Phone: [phone]   Fax:  [phone]  Name: SAED HUDLOW MRN: [id number] Date of Birth: [birth year]

## 2017-09-27 ENCOUNTER — Ambulatory Visit (HOSPITAL_COMMUNITY): Payer: BLUE CROSS/BLUE SHIELD

## 2017-09-27 ENCOUNTER — Encounter (HOSPITAL_COMMUNITY): Payer: Self-pay

## 2017-09-27 DIAGNOSIS — R262 Difficulty in walking, not elsewhere classified: Secondary | ICD-10-CM

## 2017-09-27 DIAGNOSIS — M25661 Stiffness of right knee, not elsewhere classified: Secondary | ICD-10-CM | POA: Diagnosis not present

## 2017-09-27 DIAGNOSIS — R600 Localized edema: Secondary | ICD-10-CM

## 2017-09-27 DIAGNOSIS — M6281 Muscle weakness (generalized): Secondary | ICD-10-CM

## 2017-09-27 NOTE — Therapy (Signed)
[facility] [place], [place], [address] Phone: [phone]   Fax:  [phone]  Physical Therapy Treatment  Patient Details  Name: [name] MRN: [id number] Date of Birth: 09/04/1955 Referring Provider: Joni Fears, MD   Encounter Date: 09/27/2017  PT End of Session - 09/27/17 1036    Visit Number  13    Number of Visits  19    Date for PT Re-Evaluation  10/09/17    Authorization Type  [employer] Other    Authorization Time Period  08/28/17 to 10/09/17    PT Start Time  1033    PT Stop Time  1121    PT Time Calculation (min)  48 min    Activity Tolerance  Patient tolerated treatment well;No increased pain    Behavior During Therapy  WFL for tasks assessed/performed       Past Medical History:  Diagnosis Date  . Arthritis    "right knee" (07/30/2017)  . CAP (community acquired pneumonia) 2018  . Hypertension   . Hypertension   . Wears glasses     Past Surgical History:  Procedure Laterality Date  . EYE SURGERY    . JOINT REPLACEMENT    . RETINAL DETACHMENT SURGERY Left 2000s  . TOTAL KNEE ARTHROPLASTY Right 07/30/2017  . TOTAL KNEE ARTHROPLASTY Right 07/30/2017   Procedure: RIGHT TOTAL KNEE ARTHROPLASTY;  Surgeon: [name], MD;  Location: [facility];  Service: Orthopedics;  Laterality: Right;  Marland Kitchen VASECTOMY      There were no vitals filed for this visit.  Subjective Assessment - 09/27/17 1036    Subjective  Pt states he feels like he can tighten his quad better after the estim. He denies any pain.     Patient Stated Goals  get back close to normal    Currently in Pain?  No/denies           [facility] Adult PT Treatment/Exercise - 09/27/17 0001      Knee/Hip Exercises: Stretches   Active Hamstring Stretch  Right;2 reps;30 seconds    Active Hamstring Stretch Limitations  supine with rope    Passive Hamstring Stretch  Right;3 reps;20 seconds    Passive Hamstring Stretch Limitations  supine, manually  from therapist      Knee/Hip Exercises: Aerobic   Nustep  94mins, L3, AA/ROM knee flexion, No UE, seat on 8      Knee/Hip Exercises: Supine   Quad Sets  Right    Quad Sets Limitations  x6 mins with [place] estim to R quad; 10 reps after estim with great contraction noted    Short [employer]  Right    Short [employer] Limitations  x6 mins with [place] estim to R quad    Knee Extension Limitations  13    Knee Flexion Limitations  93      Modalities   Modalities  Electrical engineer Stimulation Location  R VMO and rectus femoris (distal VMO and approximately middle rectus femoris)    Electrical Stimulation Action  muscle contraction    Electrical Stimulation Parameters  Russian; 10/10, 21mA    Electrical Stimulation Goals  Strength;Neuromuscular facilitation      Manual Therapy   Manual Therapy  Edema management;Soft tissue mobilization    Manual therapy comments  completed rest of treatment    Edema Management  retro massage with BLE elevated    Soft tissue mobilization  Medial > lateral HS during prone knee hang x4 mins           PT Education - 09/27/17 1126    Education provided  Yes    Education Details  heavy focus on quad set this weekend and added prone knee hang for improved knee extension; educated pt on importance of reducing edema and improving ROM prior to initiating heavy strengthening    Person(s) Educated  Patient    Methods  Explanation;Demonstration;Handout    Comprehension  Verbalized understanding;Need further instruction       PT Short Term Goals - 09/18/17 1037      PT SHORT TERM GOAL #1   Title  Pt will be independent with HEP and perform consistently in order to decrease pain and maximize ROM.    Baseline  3/20: reports compliance with HEP 2x daily    Time  3    Period  Weeks    Status  Achieved      PT SHORT TERM GOAL #2   Title  Pt will have improved R knee AROM from 5-95 deg in order to  decrease pain and maximize gait.    Baseline  3/20: 14-86deg    Time  3    Period  Weeks    Status  On-going      PT SHORT TERM GOAL #3   Title  Pt will have decreased joint line edema by 4cm in order to decrease pain and maximize ROM.    Baseline  3/20: 42.75cm at joint line (measured over compresison garment)    Time  3    Period  Weeks    Status  On-going      PT SHORT TERM GOAL #4   Title  Pt will be able to perform bil SLS for at least 10 sec with no UE in order to maximize gait and balance.    Baseline  3/20: 1 sec on R with 1HHA, 1 sec on L with 1 fingertip assist    Time  3    Period  Weeks    Status  On-going        PT Long Term Goals - 09/18/17 1037      PT LONG TERM GOAL #1   Title  Pt will have improved R knee AROM to 0-115deg in order to maximize gait, stair ambulation, and overall return to PLOF.    Baseline  3/20: 14-86deg    Time  6    Period  Weeks    Status  On-going      PT LONG TERM GOAL #2   Title  Pt will have improved MMT of all muscle groups tested by 1 grade or > in order to maximize gait, balance, and overall functional strength.    Baseline  3/20: see MMT    Time  6    Period  Weeks    Status  On-going      PT LONG TERM GOAL #3   Title  Pt will be able to perform 5xSTS in 12 sec or < with proper mechanics and no UE support to demo improved functional BLE strength and balance.    Baseline  3/20: 18.8sec, RLE extended    Time  6    Period  Weeks    Status  On-going      PT LONG TERM GOAL #4   Title  Pt will be able to perform the TUG with LRAD in 10 sec or < to demo improved  functional strength, balance, and maximize community ambulation.    Baseline  3/20: 16.6sec, RW    Time  6    Period  Weeks    Status  On-going      PT LONG TERM GOAL #5   Title  Pt will have improved 3MWT by 119ft or > with LRAD and gait WFL in order to demo improved functional strength, gait, and maximize community access.    Baseline  3/20: 474ft RW, gait  deviations continued    Time  6    Period  Weeks    Status  On-going            Plan - 09/27/17 1126    Clinical Impression Statement  Pt able to tolerate increased seat on Nustep for more knee flexion; no pain reported. Continued with [place] estim to R quad. Pt able to demonstrate great quad contraction afterwards with no estim. Rest of session focused on mobility and addressing edema. He continues to have edema in RLE AEB pitting edema noted after having compression garment around distal knee joint during estim. Added prone knee hang with manual to medial HS > lateral HS. PT educated pt to put heavy emphasis on quad set this weekend during HEP since his quad contraction has improved; PT also educated pt to perform prone knee hang at home. AROM 13-93 this date. Continue as planned, progressing as able.    Rehab Potential  Good    PT Frequency  3x / week    PT Duration  6 weeks    PT Treatment/Interventions  ADLs/Self Care Home Management;Cryotherapy;Electrical Stimulation;Moist Heat;Ultrasound;DME Instruction;Gait training;Stair training;Functional mobility training;Therapeutic activities;Therapeutic exercise;Balance training;Neuromuscular re-education;Patient/family education;Manual techniques;Scar mobilization;Passive range of motion;Dry needling;Energy conservation;Taping    PT Next Visit Plan  continue [place] for 1 more session for awareness of quad contraction and reduce gluteal activation.  continue prone knee hang +manual to HS; careful with aggressive overpressure during stretching d/t historical acute increase in edema and pain; continue to focus on mobility, progressing very slowly; trial very light contract relax; continue SAQ and TKE, resume standing knee flexion mobility exercises    PT Home Exercise Plan  eval: heel slides and supine HS stretch; 3/1: seated knee flexion stretch, quad set; 3/6: standing calf stretch, SAQ; 3/18: prone quad stretch with belt; 3/29: prone knee  hang    Consulted and Agree with Plan of Care  Patient       Patient will benefit from skilled therapeutic intervention in order to improve the following deficits and impairments:  Abnormal gait, Decreased activity tolerance, Decreased balance, Decreased mobility, Decreased range of motion, Decreased scar mobility, Decreased strength, Difficulty walking, Hypomobility, Increased edema, Increased fascial restricitons, Increased muscle spasms, Impaired flexibility, Improper body mechanics, Pain  Visit Diagnosis: Stiffness of right knee, not elsewhere classified  Localized edema  Difficulty in walking, not elsewhere classified  Muscle weakness (generalized)     Problem List Patient Active Problem List   Diagnosis Date Noted  . Primary osteoarthritis of right knee 07/30/2017  . S/P TKR (total knee replacement) using cement, right 07/30/2017  . Colon cancer screening 10/28/2013  . Umbilical hernia without mention of obstruction or gangrene 10/28/2013        Geraldine Solar PT, DPT  [facility] [address] [place], [place], [address] Phone: [phone]   Fax:  [phone]  Name: ALBY SCHWABE MRN: [id number] Date of Birth: 11/23/1955

## 2017-09-27 NOTE — Patient Instructions (Signed)
  Prone Knee Hang  Laying on stomach, place a rolled up towel or 2 underneath thigh just above the knee cap. Slide body down to the edge of table until knee cap is hanging off.   Hold for 5-10 mins, 2x/day   QUAD SET  Tighten your top thigh muscle as you attempt to press the back of your knee downward towards the table.

## 2017-09-30 ENCOUNTER — Ambulatory Visit (HOSPITAL_COMMUNITY): Payer: BLUE CROSS/BLUE SHIELD | Attending: Internal Medicine | Admitting: Physical Therapy

## 2017-09-30 DIAGNOSIS — M25661 Stiffness of right knee, not elsewhere classified: Secondary | ICD-10-CM

## 2017-09-30 DIAGNOSIS — R600 Localized edema: Secondary | ICD-10-CM | POA: Insufficient documentation

## 2017-09-30 DIAGNOSIS — R262 Difficulty in walking, not elsewhere classified: Secondary | ICD-10-CM | POA: Diagnosis present

## 2017-09-30 DIAGNOSIS — M6281 Muscle weakness (generalized): Secondary | ICD-10-CM | POA: Insufficient documentation

## 2017-09-30 NOTE — Therapy (Addendum)
[facility] [place], [place], [address] Phone: [phone]   Fax:  [phone]  Physical Therapy Treatment  Patient Details  Name: [name] MRN: [id number] Date of Birth: 10/26/1955 Referring Provider: Joni Fears, MD   Encounter Date: 09/30/2017    Past Medical History:  Diagnosis Date  . Arthritis    "right knee" (07/30/2017)  . CAP (community acquired pneumonia) 2018  . Hypertension   . Hypertension   . Wears glasses     Past Surgical History:  Procedure Laterality Date  . EYE SURGERY    . JOINT REPLACEMENT    . RETINAL DETACHMENT SURGERY Left 2000s  . TOTAL KNEE ARTHROPLASTY Right 07/30/2017  . TOTAL KNEE ARTHROPLASTY Right 07/30/2017   Procedure: RIGHT TOTAL KNEE ARTHROPLASTY;  Surgeon: [name], MD;  Location: [facility];  Service: Orthopedics;  Laterality: Right;  Marland Kitchen VASECTOMY       PT End of Session - 09/30/17 1545   Visit Number  14   Number of Visits  19    Date for PT Re-Evaluation  10/09/17    Authorization Type  [employer] Other    Authorization Time Period  08/28/17 to 10/09/17    PT Start Time  1300    PT Stop Time  1345   PT Time Calculation (min)  45 min    Activity Tolerance  Patient tolerated treatment well;No increased pain    Behavior During Therapy  [facility] for tasks assessed/performed               There were no vitals filed for this visit.  Subjective Assessment - 09/30/17 1538    Subjective  Pt states he goes back to his orthopedist sometime this week.  States he's doing his exercises.  Denies pain just tightness.    Currently in Pain?  No/denies               [facility] Adult PT Treatment/Exercise - 09/30/17 0001      Ambulation/Gait   Gait Comments  gait with SPC 150 feet X 2 with cues      Knee/Hip Exercises: Stretches   Active Hamstring Stretch  Right;2 reps;30 seconds    Active Hamstring Stretch Limitations  standing 12"    Knee: Self-Stretch  to increase Flexion  Right;5 reps;10 seconds      Knee/Hip Exercises: Aerobic   Nustep  84mins, L3, AA/ROM knee flexion, No UE, seat on 8      Knee/Hip Exercises: Seated   Long Arc Quad  Right;10 reps      Knee/Hip Exercises: Supine   Quad Sets  Right;15 reps    Short Arc Quad Sets  Right;10 reps    Knee Extension Limitations  12    Knee Flexion Limitations  93      Manual Therapy   Manual Therapy  Myofascial release;Soft tissue mobilization    Manual therapy comments  completed rest of treatment    Soft tissue mobilization  STM permieter of knee to loosen adhesions and promote mobiltiy    Myofascial Release  scar tissue massage and with prone knee hang               PT Short Term Goals - 09/18/17 1037      PT SHORT TERM GOAL #1   Title  Pt will be independent with HEP and perform consistently in order to decrease pain and maximize ROM.    Baseline  3/20: reports compliance with HEP  2x daily    Time  3    Period  Weeks    Status  Achieved      PT SHORT TERM GOAL #2   Title  Pt will have improved R knee AROM from 5-95 deg in order to decrease pain and maximize gait.    Baseline  3/20: 14-86deg    Time  3    Period  Weeks    Status  On-going      PT SHORT TERM GOAL #3   Title  Pt will have decreased joint line edema by 4cm in order to decrease pain and maximize ROM.    Baseline  3/20: 42.75cm at joint line (measured over compresison garment)    Time  3    Period  Weeks    Status  On-going      PT SHORT TERM GOAL #4   Title  Pt will be able to perform bil SLS for at least 10 sec with no UE in order to maximize gait and balance.    Baseline  3/20: 1 sec on R with 1HHA, 1 sec on L with 1 fingertip assist    Time  3    Period  Weeks    Status  On-going        PT Long Term Goals - 09/18/17 1037      PT LONG TERM GOAL #1   Title  Pt will have improved R knee AROM to 0-115deg in order to maximize gait, stair ambulation, and overall return to PLOF.    Baseline   3/20: 14-86deg    Time  6    Period  Weeks    Status  On-going      PT LONG TERM GOAL #2   Title  Pt will have improved MMT of all muscle groups tested by 1 grade or > in order to maximize gait, balance, and overall functional strength.    Baseline  3/20: see MMT    Time  6    Period  Weeks    Status  On-going      PT LONG TERM GOAL #3   Title  Pt will be able to perform 5xSTS in 12 sec or < with proper mechanics and no UE support to demo improved functional BLE strength and balance.    Baseline  3/20: 18.8sec, RLE extended    Time  6    Period  Weeks    Status  On-going      PT LONG TERM GOAL #4   Title  Pt will be able to perform the TUG with LRAD in 10 sec or < to demo improved functional strength, balance, and maximize community ambulation.    Baseline  3/20: 16.6sec, RW    Time  6    Period  Weeks    Status  On-going      PT LONG TERM GOAL #5   Title  Pt will have improved 3MWT by 170ft or > with LRAD and gait WFL in order to demo improved functional strength, gait, and maximize community access.    Baseline  3/20: 436ft RW, gait deviations continued    Time  6    Period  Weeks    Status  On-going            Plan - 09/30/17 1539    Clinical Impression Statement  continued with focus on knee ROM.  Pt able to demonstrate improved quad contraction, even completeing LAQ in available ROM.  Noted with significant antalgia with gait, spending extral time on this as well as using SPC instead of RW.  PT tends to have less antalgia using SPC as he is not leaning and shifting his weight off of his Rt LE.  Also used the mirrror for biofeedback to make corrections.  Pt with continued tightness perimeter of knee.  completed soft tissue and myofasical techinques to help loosen tightness.  End ROM 12-93 today.  Pt encouraged to work hard on his ROM.     Rehab Potential  Good    PT Frequency  3x / week    PT Duration  6 weeks    PT Treatment/Interventions  ADLs/Self Care Home  Management;Cryotherapy;Electrical Stimulation;Moist Heat;Ultrasound;DME Instruction;Gait training;Stair training;Functional mobility training;Therapeutic activities;Therapeutic exercise;Balance training;Neuromuscular re-education;Patient/family education;Manual techniques;Scar mobilization;Passive range of motion;Dry needling;Energy conservation;Taping    PT Next Visit Plan  Continue prone knee hang +manual to HS; careful with aggressive overpressure during stretching d/t historical acute increase in edema and pain; continue to focus on mobility, progressing very slowly; trial very light contract relax. Continue to progress towards goals.    PT Home Exercise Plan  eval: heel slides and supine HS stretch; 3/1: seated knee flexion stretch, quad set; 3/6: standing calf stretch, SAQ; 3/18: prone quad stretch with belt; 3/29: prone knee hang    Consulted and Agree with Plan of Care  Patient       Patient will benefit from skilled therapeutic intervention in order to improve the following deficits and impairments:  Abnormal gait, Decreased activity tolerance, Decreased balance, Decreased mobility, Decreased range of motion, Decreased scar mobility, Decreased strength, Difficulty walking, Hypomobility, Increased edema, Increased fascial restricitons, Increased muscle spasms, Impaired flexibility, Improper body mechanics, Pain  Visit Diagnosis: Stiffness of right knee, not elsewhere classified  Localized edema  Difficulty in walking, not elsewhere classified     Problem List Patient Active Problem List   Diagnosis Date Noted  . Primary osteoarthritis of right knee 07/30/2017  . S/P TKR (total knee replacement) using cement, right 07/30/2017  . Colon cancer screening 10/28/2013  . Umbilical hernia without mention of obstruction or gangrene 10/28/2013   Teena Irani, PTA/CLT [phone]  Teena Irani 09/30/2017, 4:14 PM  [facility] [place], [place], [address] Phone: [phone]   Fax:  [phone]  Name: [name] MRN: [id number] Date of Birth: [birth year]

## 2017-10-02 ENCOUNTER — Encounter (INDEPENDENT_AMBULATORY_CARE_PROVIDER_SITE_OTHER): Payer: Self-pay | Admitting: Orthopaedic Surgery

## 2017-10-02 ENCOUNTER — Ambulatory Visit (HOSPITAL_COMMUNITY): Payer: BLUE CROSS/BLUE SHIELD

## 2017-10-02 ENCOUNTER — Encounter (HOSPITAL_COMMUNITY): Payer: Self-pay

## 2017-10-02 ENCOUNTER — Inpatient Hospital Stay (INDEPENDENT_AMBULATORY_CARE_PROVIDER_SITE_OTHER): Payer: BLUE CROSS/BLUE SHIELD | Admitting: Orthopaedic Surgery

## 2017-10-02 ENCOUNTER — Ambulatory Visit (INDEPENDENT_AMBULATORY_CARE_PROVIDER_SITE_OTHER): Payer: BLUE CROSS/BLUE SHIELD | Admitting: Orthopaedic Surgery

## 2017-10-02 DIAGNOSIS — M6281 Muscle weakness (generalized): Secondary | ICD-10-CM

## 2017-10-02 DIAGNOSIS — R262 Difficulty in walking, not elsewhere classified: Secondary | ICD-10-CM

## 2017-10-02 DIAGNOSIS — R600 Localized edema: Secondary | ICD-10-CM

## 2017-10-02 DIAGNOSIS — M25661 Stiffness of right knee, not elsewhere classified: Secondary | ICD-10-CM

## 2017-10-02 DIAGNOSIS — Z96651 Presence of right artificial knee joint: Secondary | ICD-10-CM

## 2017-10-02 NOTE — Progress Notes (Signed)
Office Visit Note   Patient: [name]           Date of Birth: [birth year]           MRN: [id number] Visit Date: 10/02/2017              Requested by: Celene Squibb, MD [address] [place], [place] [address] PCP: Celene Squibb, MD   Assessment & Plan: Visit Diagnoses:  1. History of total right knee replacement     Plan: 2 months status post primary right total knee replacement.  Slow progress in physical with outpatient physical therapy.  Long discussion regarding rage of motion.  No work.  Office 1 month.  Continue with aggressive range of motion exercises  Follow-Up Instructions: Return in about 1 month (around 11/01/2017).   Orders:  No orders of the defined types were placed in this encounter.  No orders of the defined types were placed in this encounter.     Procedures: No procedures performed   Clinical Data: No additional findings.   Subjective: No chief complaint on file. Just over 2 months status post primary right total knee replacement .appeared to have cellulitis postoperatively.  This has resolved.  Still some swelling and "stiffness".  Still using a walker as he is having a problem with his balance.  No fever or chills.  HPI  Review of Systems  Constitutional: Negative for fever.  HENT: Negative for ear pain.   Eyes: Negative for pain.  Respiratory: Negative for cough.   Cardiovascular: Negative for leg swelling.  Gastrointestinal: Negative for constipation and diarrhea.  Genitourinary: Negative for difficulty urinating.  Musculoskeletal: Negative for neck pain.  Skin: Negative for rash.  Allergic/Immunologic: Negative for food allergies.  Neurological: Positive for weakness. Negative for dizziness and headaches.  Hematological: Does not bruise/bleed easily.  Psychiatric/Behavioral: Positive for sleep disturbance.     Objective: Vital Signs: There were no vitals taken for this visit.  Physical Exam  Ortho Exam awake alert and  oriented x3.  Comfortable sitting.  Accompanied by his wife.  Range of motion right knee about -10-93 or 4 degrees with a goniometer.  Neurovascular exam intact.  No instability.  Pain.  Minimal ankle swelling.  Specialty Comments:  No specialty comments available.  Imaging: No results found.   PMFS History: Patient Active Problem List   Diagnosis Date Noted  . Primary osteoarthritis of right knee 07/30/2017  . S/P TKR (total knee replacement) using cement, right 07/30/2017  . Colon cancer screening 10/28/2013  . Umbilical hernia without mention of obstruction or gangrene 10/28/2013   Past Medical History:  Diagnosis Date  . Arthritis    "right knee" (07/30/2017)  . CAP (community acquired pneumonia) 2018  . Hypertension   . Hypertension   . Wears glasses     Family History  Problem Relation Age of Onset  . Colon cancer Neg Hx   . Colon polyps Neg Hx     Past Surgical History:  Procedure Laterality Date  . EYE SURGERY    . JOINT REPLACEMENT    . RETINAL DETACHMENT SURGERY Left 2000s  . TOTAL KNEE ARTHROPLASTY Right 07/30/2017  . TOTAL KNEE ARTHROPLASTY Right 07/30/2017   Procedure: RIGHT TOTAL KNEE ARTHROPLASTY;  Surgeon: [name], MD;  Location: [facility];  Service: Orthopedics;  Laterality: Right;  Marland Kitchen VASECTOMY     Social History   Occupational History  . Not on file  Tobacco Use  . Smoking status:  Never Smoker  . Smokeless tobacco: Never Used  Substance and Sexual Activity  . Alcohol use: No  . Drug use: No  . Sexual activity: Never

## 2017-10-02 NOTE — Therapy (Signed)
[facility] [place], [place], [address] Phone: [phone]   Fax:  [phone]  Physical Therapy Treatment  Patient Details  Name: [name] MRN: [id number] Date of Birth: 1955-10-21 Referring Provider: Joni Fears, MD   Encounter Date: 10/02/2017  PT End of Session - 10/02/17 0950    Visit Number  14    Number of Visits  19    Date for PT Re-Evaluation  10/09/17    Authorization Type  [employer] Other    Authorization Time Period  08/28/17 to 10/09/17    PT Start Time  0942 4' on Nustep, no charge    PT Stop Time  1035    PT Time Calculation (min)  53 min    Activity Tolerance  Patient tolerated treatment well;No increased pain    Behavior During Therapy  WFL for tasks assessed/performed       Past Medical History:  Diagnosis Date  . Arthritis    "right knee" (07/30/2017)  . CAP (community acquired pneumonia) 2018  . Hypertension   . Hypertension   . Wears glasses     Past Surgical History:  Procedure Laterality Date  . EYE SURGERY    . JOINT REPLACEMENT    . RETINAL DETACHMENT SURGERY Left 2000s  . TOTAL KNEE ARTHROPLASTY Right 07/30/2017  . TOTAL KNEE ARTHROPLASTY Right 07/30/2017   Procedure: RIGHT TOTAL KNEE ARTHROPLASTY;  Surgeon: [name], MD;  Location: [facility];  Service: Orthopedics;  Laterality: Right;  Marland Kitchen VASECTOMY      There were no vitals filed for this visit.  Subjective Assessment - 10/02/17 0948    Subjective  Pt stated he is doing well today, no reports of pain.  Stated knee is tight today.  Reports benefits with the compression hose.      Currently in Pain?  No/denies                       [facility] Adult PT Treatment/Exercise - 10/02/17 0001      Ambulation/Gait   Ambulation Distance (Feet)  552 Feet    Assistive device  Straight cane    Gait Pattern  Step-through pattern;Decreased stance time - right;Decreased step length - left;Decreased hip/knee flexion -  right;Decreased dorsiflexion - right;Trendelenburg;Antalgic;Lateral trunk lean to right;Trunk flexed    Gait Comments  gait with SPC with cueing to improve weight bearing, posture and equalize stride length      Knee/Hip Exercises: Stretches   Active Hamstring Stretch  Right;3 reps;30 seconds    Active Hamstring Stretch Limitations  supine    Knee: Self-Stretch to increase Flexion  Right;10 seconds    Knee: Self-Stretch Limitations  standing, 12" step      Knee/Hip Exercises: Aerobic   Nustep  33mins, L3, AA/ROM knee flexion, No UE, seat on 8      Knee/Hip Exercises: Standing   Rocker Board  2 minutes;Limitations    Rocker Board Limitations  R/L      Knee/Hip Exercises: Seated   Long Arc Quad  Right;10 reps      Knee/Hip Exercises: Supine   Quad Sets  Right;15 reps    [employer] Limitations  3" holds    Short Arc Quad Sets  Right;15 reps    Heel Slides  Right;10 reps    Knee Extension  AROM    Knee Extension Limitations  12    Knee Flexion  AROM    Knee Flexion Limitations  93      Knee/Hip Exercises: Prone   Contract/Relax to Increase Flexion  3x gentle for flexion    Prone Knee Hang  2 minutes manual to gastroc/hamstrings       Manual Therapy   Manual Therapy  Myofascial release;Soft tissue mobilization    Manual therapy comments  completed rest of treatment    Joint Mobilization  patellar mobs all directions, especially sup/inf; AP/PA joint mobs for improved ext/flex ROM, Grade III 3x30-45" bouts each    Soft tissue mobilization  STM permieter of knee to loosen adhesions and promote mobiltiy    Myofascial Release  scar tissue massage and with prone knee hang               PT Short Term Goals - 09/18/17 1037      PT SHORT TERM GOAL #1   Title  Pt will be independent with HEP and perform consistently in order to decrease pain and maximize ROM.    Baseline  3/20: reports compliance with HEP 2x daily    Time  3    Period  Weeks    Status  Achieved      PT SHORT  TERM GOAL #2   Title  Pt will have improved R knee AROM from 5-95 deg in order to decrease pain and maximize gait.    Baseline  3/20: 14-86deg    Time  3    Period  Weeks    Status  On-going      PT SHORT TERM GOAL #3   Title  Pt will have decreased joint line edema by 4cm in order to decrease pain and maximize ROM.    Baseline  3/20: 42.75cm at joint line (measured over compresison garment)    Time  3    Period  Weeks    Status  On-going      PT SHORT TERM GOAL #4   Title  Pt will be able to perform bil SLS for at least 10 sec with no UE in order to maximize gait and balance.    Baseline  3/20: 1 sec on R with 1HHA, 1 sec on L with 1 fingertip assist    Time  3    Period  Weeks    Status  On-going        PT Long Term Goals - 09/18/17 1037      PT LONG TERM GOAL #1   Title  Pt will have improved R knee AROM to 0-115deg in order to maximize gait, stair ambulation, and overall return to PLOF.    Baseline  3/20: 14-86deg    Time  6    Period  Weeks    Status  On-going      PT LONG TERM GOAL #2   Title  Pt will have improved MMT of all muscle groups tested by 1 grade or > in order to maximize gait, balance, and overall functional strength.    Baseline  3/20: see MMT    Time  6    Period  Weeks    Status  On-going      PT LONG TERM GOAL #3   Title  Pt will be able to perform 5xSTS in 12 sec or < with proper mechanics and no UE support to demo improved functional BLE strength and balance.    Baseline  3/20: 18.8sec, RLE extended    Time  6    Period  Weeks    Status  On-going  PT LONG TERM GOAL #4   Title  Pt will be able to perform the TUG with LRAD in 10 sec or < to demo improved functional strength, balance, and maximize community ambulation.    Baseline  3/20: 16.6sec, RW    Time  6    Period  Weeks    Status  On-going      PT LONG TERM GOAL #5   Title  Pt will have improved 3MWT by 146ft or > with LRAD and gait WFL in order to demo improved functional  strength, gait, and maximize community access.    Baseline  3/20: 472ft RW, gait deviations continued    Time  6    Period  Weeks    Status  On-going            Plan - 10/02/17 1257    Clinical Impression Statement  Continued with session focus on knee ROM.  Pt continues to have difficutly activating quadriceps contractin and reduce gluteal activaiton.  Gait training with [place] with moderated cueing to improve weight bearing Rt LE with gait.  Continues to have myofascial restrictions proximal knee, manual technqiues complete to assist.  Added gentle contract/relax techniques to improve flexion.  Minimal edema restricitons noted this session, pt wearing compression hose.  EOS ROM at 12-93 degrees.  Pt encouraged to continue HEP with focus on ROM and reviewed ice application/time for pain and edema control.      Rehab Potential  Good    PT Frequency  3x / week    PT Duration  6 weeks    PT Treatment/Interventions  ADLs/Self Care Home Management;Cryotherapy;Electrical Stimulation;Moist Heat;Ultrasound;DME Instruction;Gait training;Stair training;Functional mobility training;Therapeutic activities;Therapeutic exercise;Balance training;Neuromuscular re-education;Patient/family education;Manual techniques;Scar mobilization;Passive range of motion;Dry needling;Energy conservation;Taping    PT Next Visit Plan  F/U with MD.  Continues session focus with ROM and improving weight bearing with gait with LRAD.  Continue prone knee hang +manual to HS; careful with aggressive overpressure during stretching d/t historical acute increase in edema and pain; continue to focus on mobility, progressing very slowly.  Continue to progress towards goals.    PT Home Exercise Plan  eval: heel slides and supine HS stretch; 3/1: seated knee flexion stretch, quad set; 3/6: standing calf stretch, SAQ; 3/18: prone quad stretch with belt; 3/29: prone knee hang       Patient will benefit from skilled therapeutic intervention  in order to improve the following deficits and impairments:  Abnormal gait, Decreased activity tolerance, Decreased balance, Decreased mobility, Decreased range of motion, Decreased scar mobility, Decreased strength, Difficulty walking, Hypomobility, Increased edema, Increased fascial restricitons, Increased muscle spasms, Impaired flexibility, Improper body mechanics, Pain  Visit Diagnosis: Stiffness of right knee, not elsewhere classified  Localized edema  Difficulty in walking, not elsewhere classified  Muscle weakness (generalized)     Problem List Patient Active Problem List   Diagnosis Date Noted  . Primary osteoarthritis of right knee 07/30/2017  . S/P TKR (total knee replacement) using cement, right 07/30/2017  . Colon cancer screening 10/28/2013  . Umbilical hernia without mention of obstruction or gangrene 10/28/2013   Ihor Austin, LPTA; [place]  Aldona Lento 10/02/2017, 1:04 PM  [facility] [place], [place], [address] Phone: [phone]   Fax:  [phone]  Name: [name] MRN: [id number] Date of Birth: Jun 06, 1956

## 2017-10-07 ENCOUNTER — Other Ambulatory Visit: Payer: Self-pay

## 2017-10-07 ENCOUNTER — Encounter (HOSPITAL_COMMUNITY): Payer: Self-pay

## 2017-10-07 ENCOUNTER — Ambulatory Visit (HOSPITAL_COMMUNITY): Payer: BLUE CROSS/BLUE SHIELD

## 2017-10-07 DIAGNOSIS — R600 Localized edema: Secondary | ICD-10-CM

## 2017-10-07 DIAGNOSIS — M25661 Stiffness of right knee, not elsewhere classified: Secondary | ICD-10-CM

## 2017-10-07 DIAGNOSIS — R262 Difficulty in walking, not elsewhere classified: Secondary | ICD-10-CM

## 2017-10-07 DIAGNOSIS — M6281 Muscle weakness (generalized): Secondary | ICD-10-CM

## 2017-10-07 NOTE — Therapy (Signed)
[facility] [address] [place], [place], [address] Phone: [phone]   Fax:  [phone]  Physical Therapy Treatment  Patient Details  Name: [name] MRN: [id number] Date of Birth: 09/10/1955 Referring Provider: Joni Fears, MD   Encounter Date: 10/07/2017  PT End of Session - 10/07/17 1642    Visit Number  15    Number of Visits  19    Date for PT Re-Evaluation  10/09/17    Authorization Type  [employer] Other    Authorization Time Period  08/28/17 to 10/09/17    PT Start Time  1304    PT Stop Time  1346    PT Time Calculation (min)  42 min    Activity Tolerance  Patient tolerated treatment well;No increased pain    Behavior During Therapy  WFL for tasks assessed/performed       Past Medical History:  Diagnosis Date  . Arthritis    "right knee" (07/30/2017)  . CAP (community acquired pneumonia) 2018  . Hypertension   . Hypertension   . Wears glasses     Past Surgical History:  Procedure Laterality Date  . EYE SURGERY    . JOINT REPLACEMENT    . RETINAL DETACHMENT SURGERY Left 2000s  . TOTAL KNEE ARTHROPLASTY Right 07/30/2017  . TOTAL KNEE ARTHROPLASTY Right 07/30/2017   Procedure: RIGHT TOTAL KNEE ARTHROPLASTY;  Surgeon: [name], MD;  Location: [facility];  Service: Orthopedics;  Laterality: Right;  Marland Kitchen VASECTOMY      There were no vitals filed for this visit.  Subjective Assessment - 10/07/17 1309    Subjective  Patient reports he is doing well today and denies pain. He asks about walking on tm at local gym to increase his activity level and reports his MD wants him to do more stretching for his knee due to the limited ROM.     Limitations  Walking    Patient Stated Goals  get back close to normal    Currently in Pain?  No/denies        [facility] Adult PT Treatment/Exercise - 10/07/17 0001      Knee/Hip Exercises: Stretches   Active Hamstring Stretch  Right;3 reps;30 seconds    Active Hamstring Stretch  Limitations  12" box    Knee: Self-Stretch to increase Flexion  Right;30 seconds;3 reps    Knee: Self-Stretch Limitations  standing, 12" step      Knee/Hip Exercises: Aerobic   Nustep  91mins, L3, AA/ROM knee flexion, No UE, seat on 8      Knee/Hip Exercises: Seated   Other Seated Knee/Hip Exercises  Biodex for 10 minutes for PROM. Began patient at 75% and progressed to 90% of available ROM based onmachine limits, will se further limits next session.      Knee/Hip Exercises: Supine   Quad Sets  Right;15 reps    Quad Sets Limitations  3 sec holds; cues for quad activaiton versus glut activation    Heel Slides  Right;10 reps    Knee Extension  AROM    Knee Extension Limitations  13    Knee Flexion  AROM    Knee Flexion Limitations  95      Manual Therapy   Manual Therapy  Joint mobilization;Myofascial release    Manual therapy comments  completed rest of treatment    Joint Mobilization  AP glide for flexion 3x 30-45 seconds, grade III/IV    Myofascial Release  scar tissue massage for adhesions  on incision       PT Education - 10/07/17 1350    Education provided  Yes    Education Details  Educated on improtance of stretching into full end range of motion. Discussed assessing gait on treadmill next session for returning to gym to perform. Educated on scar massage to reduce adhesions for improved mobility.    Person(s) Educated  Patient    Methods  Explanation    Comprehension  Verbalized understanding       PT Short Term Goals - 09/18/17 1037      PT SHORT TERM GOAL #1   Title  Pt will be independent with HEP and perform consistently in order to decrease pain and maximize ROM.    Baseline  3/20: reports compliance with HEP 2x daily    Time  3    Period  Weeks    Status  Achieved      PT SHORT TERM GOAL #2   Title  Pt will have improved R knee AROM from 5-95 deg in order to decrease pain and maximize gait.    Baseline  3/20: 14-86deg    Time  3    Period  Weeks    Status   On-going      PT SHORT TERM GOAL #3   Title  Pt will have decreased joint line edema by 4cm in order to decrease pain and maximize ROM.    Baseline  3/20: 42.75cm at joint line (measured over compresison garment)    Time  3    Period  Weeks    Status  On-going      PT SHORT TERM GOAL #4   Title  Pt will be able to perform bil SLS for at least 10 sec with no UE in order to maximize gait and balance.    Baseline  3/20: 1 sec on R with 1HHA, 1 sec on L with 1 fingertip assist    Time  3    Period  Weeks    Status  On-going        PT Long Term Goals - 09/18/17 1037      PT LONG TERM GOAL #1   Title  Pt will have improved R knee AROM to 0-115deg in order to maximize gait, stair ambulation, and overall return to PLOF.    Baseline  3/20: 14-86deg    Time  6    Period  Weeks    Status  On-going      PT LONG TERM GOAL #2   Title  Pt will have improved MMT of all muscle groups tested by 1 grade or > in order to maximize gait, balance, and overall functional strength.    Baseline  3/20: see MMT    Time  6    Period  Weeks    Status  On-going      PT LONG TERM GOAL #3   Title  Pt will be able to perform 5xSTS in 12 sec or < with proper mechanics and no UE support to demo improved functional BLE strength and balance.    Baseline  3/20: 18.8sec, RLE extended    Time  6    Period  Weeks    Status  On-going      PT LONG TERM GOAL #4   Title  Pt will be able to perform the TUG with LRAD in 10 sec or < to demo improved functional strength, balance, and maximize community ambulation.    Baseline  3/20: 16.6sec, RW    Time  6    Period  Weeks    Status  On-going      PT LONG TERM GOAL #5   Title  Pt will have improved 3MWT by 159ft or > with LRAD and gait WFL in order to demo improved functional strength, gait, and maximize community access.    Baseline  3/20: 447ft RW, gait deviations continued    Time  6    Period  Weeks    Status  On-going          Plan - 10/07/17 1343     Clinical Impression Statement  This session focused on PROM for Rt knee and stretching exercises emphasizing to patient importance of stretching into end range of motion. Pt continues to have difficulty activating quadriceps and compensates with glute activation. Initiated Biodex today for ROM and when patient began to feel a stretch into flexion he weight shifted towards Lt hip to decreased stretch requiring verbal cues to deter this compensation. Patient was not wearing his compression garment this session and pitting edema was noted edema surrounding Rt knee. He was educated on continued use of compression garment to decrease swelling. AROM this session 13-95 degrees. Patient was educated to continue with stretches and perform them into slight discomfort to achieve end range stretch. Educated to continue with ice and elevation.     Rehab Potential  Good    PT Frequency  3x / week    PT Duration  6 weeks    PT Treatment/Interventions  ADLs/Self Care Home Management;Cryotherapy;Electrical Stimulation;Moist Heat;Ultrasound;DME Instruction;Gait training;Stair training;Functional mobility training;Therapeutic activities;Therapeutic exercise;Balance training;Neuromuscular re-education;Patient/family education;Manual techniques;Scar mobilization;Passive range of motion;Dry needling;Energy conservation;Taping    PT Next Visit Plan  (MD wants patient to continue - re-assess next session and send new auth for 3 more weeks). Continue with Biodex and begin more aggressive ROM per MD. Initiate gait training with SPC and trial walking on treadmill for patient to return to gym .Continue prone knee hang +manual to HS; careful with aggressive overpressure during stretching d/t historical acute increase in edema and pain; continue to focus on mobility, progressing very slowly.  Continue to educate on edema control.    PT Home Exercise Plan  eval: heel slides and supine HS stretch; 3/1: seated knee flexion stretch, quad set;  3/6: standing calf stretch, SAQ; 3/18: prone quad stretch with belt; 3/29: prone knee hang    Consulted and Agree with Plan of Care  Patient;Family member/caregiver son    Family Member Consulted  son       Patient will benefit from skilled therapeutic intervention in order to improve the following deficits and impairments:  Abnormal gait, Decreased activity tolerance, Decreased balance, Decreased mobility, Decreased range of motion, Decreased scar mobility, Decreased strength, Difficulty walking, Hypomobility, Increased edema, Increased fascial restricitons, Increased muscle spasms, Impaired flexibility, Improper body mechanics, Pain  Visit Diagnosis: Stiffness of right knee, not elsewhere classified  Localized edema  Difficulty in walking, not elsewhere classified  Muscle weakness (generalized)     Problem List Patient Active Problem List   Diagnosis Date Noted  . Primary osteoarthritis of right knee 07/30/2017  . S/P TKR (total knee replacement) using cement, right 07/30/2017  . Colon cancer screening 10/28/2013  . Umbilical hernia without mention of obstruction or gangrene 10/28/2013    Kipp Brood, PT, DPT Physical Therapist with [facility]  10/07/2017 4:56 PM    [address]  [place], [place], [address] Phone: [phone]   Fax:  [phone]  Name: BERNARD SLAYDEN MRN: [id number] Date of Birth: 10-Nov-1955

## 2017-10-09 ENCOUNTER — Encounter (HOSPITAL_COMMUNITY): Payer: Self-pay

## 2017-10-09 ENCOUNTER — Ambulatory Visit (HOSPITAL_COMMUNITY): Payer: BLUE CROSS/BLUE SHIELD

## 2017-10-09 DIAGNOSIS — R600 Localized edema: Secondary | ICD-10-CM

## 2017-10-09 DIAGNOSIS — R262 Difficulty in walking, not elsewhere classified: Secondary | ICD-10-CM

## 2017-10-09 DIAGNOSIS — M6281 Muscle weakness (generalized): Secondary | ICD-10-CM

## 2017-10-09 DIAGNOSIS — M25661 Stiffness of right knee, not elsewhere classified: Secondary | ICD-10-CM

## 2017-10-10 NOTE — Therapy (Signed)
[facility] [address] [place], [place], [address] Phone: [phone]   Fax:  [phone]  Physical Therapy Re-Evaluation/Treatment  Patient Details  Name: [name] MRN: [id number] Date of Birth: Aug 14, 1955 Referring Provider: Joni Fears MD   Encounter Date: 10/09/2017  PT End of Session - 10/10/17 0947    Visit Number  16    Number of Visits  19    Date for PT Re-Evaluation  10/30/17    Authorization Type  BC[employer] Other    Authorization Time Period  08/28/17 to 10/09/17    PT Start Time  1040    PT Stop Time  1116    PT Time Calculation (min)  36 min    Activity Tolerance  Patient tolerated treatment well;No increased pain    Behavior During Therapy  WFL for tasks assessed/performed       Past Medical History:  Diagnosis Date  . Arthritis    "right knee" (07/30/2017)  . CAP (community acquired pneumonia) 2018  . Hypertension   . Hypertension   . Wears glasses     Past Surgical History:  Procedure Laterality Date  . EYE SURGERY    . JOINT REPLACEMENT    . RETINAL DETACHMENT SURGERY Left 2000s  . TOTAL KNEE ARTHROPLASTY Right 07/30/2017  . TOTAL KNEE ARTHROPLASTY Right 07/30/2017   Procedure: RIGHT TOTAL KNEE ARTHROPLASTY;  Surgeon: [name], MD;  Location: [facility];  Service: Orthopedics;  Laterality: Right;  Marland Kitchen VASECTOMY      There were no vitals filed for this visit.  Subjective Assessment - 10/09/17 1049    Subjective  Patient no pain; wants to get more ROM in rt knee. Not sure if he is going to be able to return to work standing all day. Knows he still favors his Rt knee when he walks.    How long can you sit comfortably?  45 minutes    How long can you stand comfortably?  40-55 mins    How long can you walk comfortably?  1/2 block    Patient Stated Goals  get back close to normal    Currently in Pain?  No/denies knee feels weak    Aggravating Factors   bending    Pain Relieving Factors  rubbing,  keep it moving    Effect of Pain on Daily Activities  decreases activity level         [facility] PT Assessment - 10/10/17 0001      Assessment   Medical Diagnosis  R THA    Referring Provider  Joni Fears MD    Onset Date/Surgical Date  07/30/17      Observation/Other Assessments   Focus on Therapeutic Outcomes (FOTO)   53% limited was 66% limited      Observation/Other Assessments-Edema    Edema  Circumferential      Circumferential Edema   Circumferential - Right  47.8 cm joint line      AROM   Right Knee Extension  16 was 14    Right Knee Flexion  86 was 86      Strength   Right Hip Extension  4-/5 waas 3-/5    Right Hip ABduction  4-/5 was 4-/5    Left Hip Extension  4/5 was 3/5    Left Hip ABduction  4+/5 was 4+/5    Right Knee Flexion  4-/5 was 4+/5      Transfers   Five time sit to stand comments  22 sec (was 18)     Ambulation/Gait   Ambulation Distance (Feet)  602 Feet was 552    Assistive device  Rolling walker    Gait Pattern  Step-through pattern;Decreased step length - left;Decreased stance time - right;Decreased stride length;Decreased weight shift to right;Right flexed knee in stance;Antalgic;Trunk flexed    Gait velocity  1.11 m/s      Balance   Balance Assessed  Yes      Static Standing Balance   Static Standing - Balance Support  No upper extremity supported    Static Standing - Level of Assistance  5: Stand by assistance    Static Standing Balance -  Activities   Single Leg Stance - Right Leg;Single Leg Stance - Left Leg    Static Standing - Comment/# of Minutes  Lt 2 sec; Rt 0 sec      Timed Up and Go Test   TUG  Normal TUG    Normal TUG (seconds)  15 was 17    TUG Comments  RW                           PT Education - 10/10/17 0946    Education provided  Yes    Education Details  reviewed progress and discussed importance of performing HEP.     Person(s) Educated  Patient    Methods  Explanation    Comprehension   Verbalized understanding       PT Short Term Goals - 10/10/17 1000      PT SHORT TERM GOAL #1   Title  Pt will be independent with HEP and perform consistently in order to decrease pain and maximize ROM.    Baseline  3/20: reports compliance with HEP 2x daily    Time  3    Period  Weeks    Status  Achieved      PT SHORT TERM GOAL #2   Title  Pt will have improved R knee AROM from 5-95 deg in order to decrease pain and maximize gait.    Baseline  3/20: 14-86deg; 4/10: 16-86    Time  3    Period  Weeks    Status  On-going    Target Date  10/30/17      PT SHORT TERM GOAL #3   Title  Pt will have decreased joint line edema by 4cm in order to decrease pain and maximize ROM.    Baseline  3/20: 42.75cm at joint line (measured over compresison garment); 4/10: 47.8 cm at joint line (measured over compresison garment)    Time  3    Period  Weeks    Status  On-going      PT SHORT TERM GOAL #4   Title  Pt will be able to perform bil SLS for at least 10 sec with no UE in order to maximize gait and balance.    Baseline  3/20: 1 sec on R with 1HHA, 1 sec on L with 1 fingertip assist; 4/10: Rt 0 sec; Lt 2 sec    Time  3    Period  Weeks    Status  On-going        PT Long Term Goals - 10/10/17 1002      PT LONG TERM GOAL #1   Title  Pt will have improved R knee AROM to 0-115deg in order to maximize gait, stair ambulation, and overall return to PLOF.  Baseline  3/20: 14-86deg; 4/10: 16-86    Time  6    Period  Weeks    Status  On-going    Target Date  10/30/17      PT LONG TERM GOAL #2   Title  Pt will have improved MMT of all muscle groups tested by 1 grade or > in order to maximize gait, balance, and overall functional strength.    Baseline  3/20: see MMT; 4/10: see MMT    Time  3    Period  Weeks    Status  On-going    Target Date  10/30/17      PT LONG TERM GOAL #3   Title  Pt will be able to perform 5xSTS in 12 sec or < with proper mechanics and no UE support to demo  improved functional BLE strength and balance.    Baseline  3/20: 18.8sec, RLE extended; 4/10: 22 sec    Time  3    Period  Weeks    Status  On-going      PT LONG TERM GOAL #4   Title  Pt will be able to perform the TUG with LRAD in 10 sec or < to demo improved functional strength, balance, and maximize community ambulation.    Baseline  3/20: 16.6sec, RW; 4/10: 15 sec RW    Time  3    Period  Weeks    Status  On-going      PT LONG TERM GOAL #5   Title  Pt will have improved 3MWT by 164ft or > with LRAD and gait WFL in order to demo improved functional strength, gait, and maximize community access.    Baseline  3/20: 435ft RW, gait deviations continued 4/10: 602 ft RW gait deviations continue    Time  3    Period  Weeks    Status  On-going            Plan - 10/10/17 0954    Clinical Impression Statement  Re-evaluation performed for additional visits. Patient exhibited improvement in most measurements, however, AROM  Rt knee flexion and extension are largely unchanged and Rt knee flexion MMT has decreased. Patient continues to exhibit deficits in gait, strength, balance, endurance, range of motion and functional ability. Patient would continue to benefit from skilled physical therapy to continued to improve the aforementioned deficits and improved his overall functional level in hopes patient will be able to tolerate the standing position long enough to return to work.    History and Personal Factors relevant to plan of care:  h/o OA, CAP, HTN, chronic LBP with subjective reports of sciatica    Clinical Presentation  Stable    Clinical Presentation due to:  ROM, MMT, edema, SLS, 5xSTS, 3MWT, TUG, functional strength    Clinical Decision Making  Low    Rehab Potential  Fair    PT Frequency  3x / week    PT Duration  3 weeks    PT Treatment/Interventions  ADLs/Self Care Home Management;Cryotherapy;Electrical Stimulation;Moist Heat;Ultrasound;DME Instruction;Gait training;Stair  training;Functional mobility training;Therapeutic activities;Therapeutic exercise;Balance training;Neuromuscular re-education;Patient/family education;Manual techniques;Scar mobilization;Passive range of motion;Dry needling;Energy conservation;Taping;Biofeedback    PT Next Visit Plan  Continue with Biodex and begin more aggressive ROM per MD. Initiate gait training with SPC and trial walking on treadmill for patient to return to gym. Continue prone knee hang +manual to HS; careful with aggressive overpressure during stretching d/t historical acute increase in edema and pain; continue to focus on mobility, progressing  very slowly.  Continue to educate on edema control.    PT Home Exercise Plan  eval: heel slides and supine HS stretch; 3/1: seated knee flexion stretch, quad set; 3/6: standing calf stretch, SAQ; 3/18: prone quad stretch with belt; 3/29: prone knee hang    Consulted and Agree with Plan of Care  Patient       Patient will benefit from skilled therapeutic intervention in order to improve the following deficits and impairments:  Abnormal gait, Decreased activity tolerance, Decreased balance, Decreased mobility, Decreased range of motion, Decreased scar mobility, Decreased strength, Difficulty walking, Hypomobility, Increased edema, Increased fascial restricitons, Increased muscle spasms, Impaired flexibility, Improper body mechanics, Pain, Decreased endurance  Visit Diagnosis: Stiffness of right knee, not elsewhere classified  Localized edema  Difficulty in walking, not elsewhere classified  Muscle weakness (generalized)     Problem List Patient Active Problem List   Diagnosis Date Noted  . Primary osteoarthritis of right knee 07/30/2017  . S/P TKR (total knee replacement) using cement, right 07/30/2017  . Colon cancer screening 10/28/2013  . Umbilical hernia without mention of obstruction or gangrene 10/28/2013    [name]. Hartnett-Rands, MS, PT [facility] #[id number] 10/10/2017, 10:07 AM  [facility] [address] [place], [place], [address] Phone: [phone]   Fax:  [phone]  Name: [name] MRN: [id number] Date of Birth: 04-12-1956

## 2017-10-10 NOTE — Addendum Note (Signed)
Addended by: Jeannie Done on: 10/10/2017 10:11 AM   Modules accepted: Orders

## 2017-10-11 ENCOUNTER — Ambulatory Visit (HOSPITAL_COMMUNITY): Payer: BLUE CROSS/BLUE SHIELD

## 2017-10-11 DIAGNOSIS — M6281 Muscle weakness (generalized): Secondary | ICD-10-CM

## 2017-10-11 DIAGNOSIS — M25661 Stiffness of right knee, not elsewhere classified: Secondary | ICD-10-CM

## 2017-10-11 DIAGNOSIS — R600 Localized edema: Secondary | ICD-10-CM

## 2017-10-11 DIAGNOSIS — R262 Difficulty in walking, not elsewhere classified: Secondary | ICD-10-CM

## 2017-10-11 NOTE — Therapy (Signed)
[facility] [address] [place], [place], [address] Phone: [phone]   Fax:  [phone]  Physical Therapy Treatment  Patient Details  Name: [name] MRN: [id number] Date of Birth: 1955/08/30 Referring Provider: Joni Fears MD   Encounter Date: 10/11/2017  PT End of Session - 10/11/17 1051    Visit Number  17    Number of Visits  19    Date for PT Re-Evaluation  10/30/17    Authorization Type  [employer] Other    Authorization Time Period  08/28/17 to 10/09/17; 4/10-5/1     PT Start Time  1032    PT Stop Time  1112    PT Time Calculation (min)  40 min    Activity Tolerance  Patient tolerated treatment well;No increased pain    Behavior During Therapy  WFL for tasks assessed/performed       Past Medical History:  Diagnosis Date  . Arthritis    "right knee" (07/30/2017)  . CAP (community acquired pneumonia) 2018  . Hypertension   . Hypertension   . Wears glasses     Past Surgical History:  Procedure Laterality Date  . EYE SURGERY    . JOINT REPLACEMENT    . RETINAL DETACHMENT SURGERY Left 2000s  . TOTAL KNEE ARTHROPLASTY Right 07/30/2017  . TOTAL KNEE ARTHROPLASTY Right 07/30/2017   Procedure: RIGHT TOTAL KNEE ARTHROPLASTY;  Surgeon: [name], MD;  Location: [facility];  Service: Orthopedics;  Laterality: Right;  Marland Kitchen VASECTOMY      There were no vitals filed for this visit.  Subjective Assessment - 10/11/17 1039    Subjective  Pt reports he is doine well today. He says his knee is coming along. He asks about trying the treadmill today, but educated that it's not appropriate yet at this point in his recovery. He says hes excited to get back to the gym soon. HisHEP is going fair.     Currently in Pain?  No/denies                       [facility] Adult PT Treatment/Exercise - 10/11/17 0001      Knee/Hip Exercises: Stretches   Quad Stretch  Right Prone low-load-long-duration stretching knee flexion    Quad Stretch Limitations  5x60sec, knee ~85 degrees Strong Rectus limitations     Other Knee/Hip Stretches  Gentle AA/ROM Rt knee using NuStep Seat 9, arms 10, level 2, x53min      Knee/Hip Exercises: Supine   Knee Extension  PROM    Knee Extension Limitations  21    Knee Flexion  PROM    Knee Flexion Limitations  95             PT Education - 10/10/17 0946    Education provided  Yes    Education Details  reviewed progress and discussed importance of performing HEP.     Person(s) Educated  Patient    Methods  Explanation    Comprehension  Verbalized understanding       PT Short Term Goals - 10/10/17 1000      PT SHORT TERM GOAL #1   Title  Pt will be independent with HEP and perform consistently in order to decrease pain and maximize ROM.    Baseline  3/20: reports compliance with HEP 2x daily    Time  3    Period  Weeks    Status  Achieved  PT SHORT TERM GOAL #2   Title  Pt will have improved R knee AROM from 5-95 deg in order to decrease pain and maximize gait.    Baseline  3/20: 14-86deg; 4/10: 16-86    Time  3    Period  Weeks    Status  On-going    Target Date  10/30/17      PT SHORT TERM GOAL #3   Title  Pt will have decreased joint line edema by 4cm in order to decrease pain and maximize ROM.    Baseline  3/20: 42.75cm at joint line (measured over compresison garment); 4/10: 47.8 cm at joint line (measured over compresison garment)    Time  3    Period  Weeks    Status  On-going      PT SHORT TERM GOAL #4   Title  Pt will be able to perform bil SLS for at least 10 sec with no UE in order to maximize gait and balance.    Baseline  3/20: 1 sec on R with 1HHA, 1 sec on L with 1 fingertip assist; 4/10: Rt 0 sec; Lt 2 sec    Time  3    Period  Weeks    Status  On-going        PT Long Term Goals - 10/10/17 1002      PT LONG TERM GOAL #1   Title  Pt will have improved R knee AROM to 0-115deg in order to maximize gait, stair ambulation, and overall  return to PLOF.    Baseline  3/20: 14-86deg; 4/10: 16-86    Time  6    Period  Weeks    Status  On-going    Target Date  10/30/17      PT LONG TERM GOAL #2   Title  Pt will have improved MMT of all muscle groups tested by 1 grade or > in order to maximize gait, balance, and overall functional strength.    Baseline  3/20: see MMT; 4/10: see MMT    Time  3    Period  Weeks    Status  On-going    Target Date  10/30/17      PT LONG TERM GOAL #3   Title  Pt will be able to perform 5xSTS in 12 sec or < with proper mechanics and no UE support to demo improved functional BLE strength and balance.    Baseline  3/20: 18.8sec, RLE extended; 4/10: 22 sec    Time  3    Period  Weeks    Status  On-going      PT LONG TERM GOAL #4   Title  Pt will be able to perform the TUG with LRAD in 10 sec or < to demo improved functional strength, balance, and maximize community ambulation.    Baseline  3/20: 16.6sec, RW; 4/10: 15 sec RW    Time  3    Period  Weeks    Status  On-going      PT LONG TERM GOAL #5   Title  Pt will have improved 3MWT by 156ft or > with LRAD and gait WFL in order to demo improved functional strength, gait, and maximize community access.    Baseline  3/20: 419ft RW, gait deviations continued 4/10: 602 ft RW gait deviations continue    Time  3    Period  Weeks    Status  On-going  Plan - 10/11/17 1059    Clinical Impression Statement  Focal focus on targeted specific low-load-long-duration stretching. Knee remains unrelentingly immobile at less than 100 degrees flexion. Noted muscular restrictions from rectus, which are unlikely to blame for entirety of ROM loss, whereas joint effusion/peripheral edema are also suspected to be contributing heavily, espeically considering extension ROM deficits concurrently. Presentaiton consistent with joint arthrofibrosis from what limited information is available. These presentations are difficult to respond to typical PT  interventions and often unsuccessful. Quads activtion remains poor with heavy gait compensations. This Pryor Curia has concerns that heavy focus on ROM without sufficicent emphasis on strength will limit the ability of the patient to develop requisite compensatory strategy and functional use of the joitn without chronic adjacent segment disorder of the hip/ SIJ or lumbar spine.     Rehab Potential  Poor    PT Frequency  3x / week    PT Duration  3 weeks    PT Treatment/Interventions  ADLs/Self Care Home Management;Cryotherapy;Electrical Stimulation;Moist Heat;Ultrasound;DME Instruction;Gait training;Stair training;Functional mobility training;Therapeutic activities;Therapeutic exercise;Balance training;Neuromuscular re-education;Patient/family education;Manual techniques;Scar mobilization;Passive range of motion;Dry needling;Energy conservation;Taping;Biofeedback    PT Next Visit Plan  Continue with Biodex and begin more aggressive ROM per MD. Initiate gait training with SPC. Continue prone knee hang +manual to HS; careful with aggressive overpressure during stretching d/t historical acute increase in edema and pain; continue to focus on mobility, progressing very slowly.  Continue to educate on edema control.    PT Home Exercise Plan  eval: heel slides and supine HS stretch; 3/1: seated knee flexion stretch, quad set; 3/6: standing calf stretch, SAQ; 3/18: prone quad stretch with belt; 3/29: prone knee hang    Consulted and Agree with Plan of Care  Patient       Patient will benefit from skilled therapeutic intervention in order to improve the following deficits and impairments:  Abnormal gait, Decreased activity tolerance, Decreased balance, Decreased mobility, Decreased range of motion, Decreased scar mobility, Decreased strength, Difficulty walking, Hypomobility, Increased edema, Increased fascial restricitons, Increased muscle spasms, Impaired flexibility, Improper body mechanics, Pain, Decreased  endurance  Visit Diagnosis: Stiffness of right knee, not elsewhere classified  Localized edema  Difficulty in walking, not elsewhere classified  Muscle weakness (generalized)     Problem List Patient Active Problem List   Diagnosis Date Noted  . Primary osteoarthritis of right knee 07/30/2017  . S/P TKR (total knee replacement) using cement, right 07/30/2017  . Colon cancer screening 10/28/2013  . Umbilical hernia without mention of obstruction or gangrene 10/28/2013   11:21 AM, 10/11/17 Etta Grandchild, PT, DPT Physical Therapist at [facility] [phone] (office)      Etta Grandchild 10/11/2017, 11:21 AM  [facility] [place], [place], [address] Phone: [phone]   Fax:  [phone]  Name: OAKLEN THIAM MRN: [id number] Date of Birth: January 13, 1956

## 2017-10-15 ENCOUNTER — Ambulatory Visit (HOSPITAL_COMMUNITY): Payer: BLUE CROSS/BLUE SHIELD

## 2017-10-15 ENCOUNTER — Encounter (HOSPITAL_COMMUNITY): Payer: Self-pay

## 2017-10-15 DIAGNOSIS — R600 Localized edema: Secondary | ICD-10-CM

## 2017-10-15 DIAGNOSIS — M25661 Stiffness of right knee, not elsewhere classified: Secondary | ICD-10-CM | POA: Diagnosis not present

## 2017-10-15 DIAGNOSIS — M6281 Muscle weakness (generalized): Secondary | ICD-10-CM

## 2017-10-15 DIAGNOSIS — R262 Difficulty in walking, not elsewhere classified: Secondary | ICD-10-CM

## 2017-10-15 NOTE — Therapy (Signed)
[place], [place], [address] Phone: [phone]   Fax:  [phone]  Physical Therapy Treatment  Patient Details  Name: [name] MRN: [id number] Date of Birth: May 31, 1956 Referring Provider: Joni Fears MD   Encounter Date: 10/15/2017  PT End of Session - 10/15/17 1118    Visit Number  18    Number of Visits  28 +9 3x/3weeks per reassessment 4/10    Date for PT Re-Evaluation  10/30/17    Authorization Type  [employer] Other    Authorization Time Period  08/28/17 to 10/09/17; 4/10-5/1     PT Start Time  1112 Began on Nustep, no charge x 71min    PT Stop Time  1205    PT Time Calculation (min)  53 min    Activity Tolerance  Patient tolerated treatment well;No increased pain    Behavior During Therapy  WFL for tasks assessed/performed       Past Medical History:  Diagnosis Date  . Arthritis    "right knee" (07/30/2017)  . CAP (community acquired pneumonia) 2018  . Hypertension   . Hypertension   . Wears glasses     Past Surgical History:  Procedure Laterality Date  . EYE SURGERY    . JOINT REPLACEMENT    . RETINAL DETACHMENT SURGERY Left 2000s  . TOTAL KNEE ARTHROPLASTY Right 07/30/2017  . TOTAL KNEE ARTHROPLASTY Right 07/30/2017   Procedure: RIGHT TOTAL KNEE ARTHROPLASTY;  Surgeon: [name], MD;  Location: [facility];  Service: Orthopedics;  Laterality: Right;  Marland Kitchen VASECTOMY      There were no vitals filed for this visit.  Subjective Assessment - 10/15/17 1118    Subjective  Pt stated knee is feeling good today, no reports of pain.      Patient Stated Goals  get back close to normal    Currently in Pain?  No/denies                       [facility] Adult PT Treatment/Exercise - 10/15/17 0001      Ambulation/Gait   Ambulation Distance (Feet)  552 Feet    Assistive device  Rolling walker    Gait Comments  gait with RW with moderate cueing to reduce flat foot and improve heel  strike, cueing for posture, weight bearing Rt LE and heel to toe pattern      Knee/Hip Exercises: Aerobic   Nustep  22mins, L3, AA/ROM knee flexion, No UE, seat on 8      Knee/Hip Exercises: Machines for Strengthening   Other Machine  Biodex PROM / - 89%, flexion at 88%      Knee/Hip Exercises: Standing   Gait Training  226 x 2 ; adjusted RW height to improve posture, mod-max cueing for heel strike (tendency to flat foot)    Other Standing Knee Exercises  retro gait x 15 ft (education on where to push off toes behind      Knee/Hip Exercises: Seated   Long Arc Quad  Right;15 reps 5" holds      Knee/Hip Exercises: Supine   Short Arc Quad Sets  Right;15 reps    Knee Extension  PROM;3 sets;AROM PROM 3x 30" per pt tolerance    Knee Extension Limitations  19 AAROM 15    Knee Flexion  PROM    Knee Flexion Limitations  AROM 95 AAROM 100      Knee/Hip Exercises: Prone   Contract/Relax to  Increase Flexion  3x gentle for flexion      Manual Therapy   Manual Therapy  Joint mobilization;Myofascial release;Passive ROM    Manual therapy comments  completed rest of treatment    Joint Mobilization  AP glide for flexion 3x 30-45 seconds, grade III/IV    Soft tissue mobilization  STM with focus on quadriceps to address restrictions with rectus    Passive ROM  Supine with bolster under ankle for extension/ contract relax for flexion 5 reps with 10" quad activation then 20" relax for flexion               PT Short Term Goals - 10/10/17 1000      PT SHORT TERM GOAL #1   Title  Pt will be independent with HEP and perform consistently in order to decrease pain and maximize ROM.    Baseline  3/20: reports compliance with HEP 2x daily    Time  3    Period  Weeks    Status  Achieved      PT SHORT TERM GOAL #2   Title  Pt will have improved R knee AROM from 5-95 deg in order to decrease pain and maximize gait.    Baseline  3/20: 14-86deg; 4/10: 16-86    Time  3    Period  Weeks    Status   On-going    Target Date  10/30/17      PT SHORT TERM GOAL #3   Title  Pt will have decreased joint line edema by 4cm in order to decrease pain and maximize ROM.    Baseline  3/20: 42.75cm at joint line (measured over compresison garment); 4/10: 47.8 cm at joint line (measured over compresison garment)    Time  3    Period  Weeks    Status  On-going      PT SHORT TERM GOAL #4   Title  Pt will be able to perform bil SLS for at least 10 sec with no UE in order to maximize gait and balance.    Baseline  3/20: 1 sec on R with 1HHA, 1 sec on L with 1 fingertip assist; 4/10: Rt 0 sec; Lt 2 sec    Time  3    Period  Weeks    Status  On-going        PT Long Term Goals - 10/10/17 1002      PT LONG TERM GOAL #1   Title  Pt will have improved R knee AROM to 0-115deg in order to maximize gait, stair ambulation, and overall return to PLOF.    Baseline  3/20: 14-86deg; 4/10: 16-86    Time  6    Period  Weeks    Status  On-going    Target Date  10/30/17      PT LONG TERM GOAL #2   Title  Pt will have improved MMT of all muscle groups tested by 1 grade or > in order to maximize gait, balance, and overall functional strength.    Baseline  3/20: see MMT; 4/10: see MMT    Time  3    Period  Weeks    Status  On-going    Target Date  10/30/17      PT LONG TERM GOAL #3   Title  Pt will be able to perform 5xSTS in 12 sec or < with proper mechanics and no UE support to demo improved functional BLE strength and balance.  Baseline  3/20: 18.8sec, RLE extended; 4/10: 22 sec    Time  3    Period  Weeks    Status  On-going      PT LONG TERM GOAL #4   Title  Pt will be able to perform the TUG with LRAD in 10 sec or < to demo improved functional strength, balance, and maximize community ambulation.    Baseline  3/20: 16.6sec, RW; 4/10: 15 sec RW    Time  3    Period  Weeks    Status  On-going      PT LONG TERM GOAL #5   Title  Pt will have improved 3MWT by 180ft or > with LRAD and gait WFL in  order to demo improved functional strength, gait, and maximize community access.    Baseline  3/20: 463ft RW, gait deviations continued 4/10: 602 ft RW gait deviations continue    Time  3    Period  Weeks    Status  On-going            Plan - 10/15/17 1216    Clinical Impression Statement  Session focus on improving gait mechanics to improve knee mobility and quad strengthening.  Pt continues to require cueing for posture and heel strike gait mechanics, pt educated on importance of mechanics to improve knee extension.  Cotninues to have poor quad activation and tendency to ER hip due to weakness.  Resumed LAQ/SAQ this session for extension strengthening.  Pt toleraned well BIODEX today and PROM for both extension (supine) and prone (contract/relax).  No reports of increased pain through session.  Reviewed frequency/duration with ice application, encouraged to increase frequency to address edema.    Rehab Potential  Poor    PT Frequency  3x / week    PT Duration  3 weeks    PT Treatment/Interventions  ADLs/Self Care Home Management;Cryotherapy;Electrical Stimulation;Moist Heat;Ultrasound;DME Instruction;Gait training;Stair training;Functional mobility training;Therapeutic activities;Therapeutic exercise;Balance training;Neuromuscular re-education;Patient/family education;Manual techniques;Scar mobilization;Passive range of motion;Dry needling;Energy conservation;Taping;Biofeedback    PT Next Visit Plan  Continue with Biodex and begin more aggressive ROM per MD. Initiate gait training with SPC. Continue prone knee hang +manual to HS; careful with aggressive overpressure during stretching d/t historical acute increase in edema and pain; continue to focus on mobility, progressing very slowly.  Continue to educate on edema control.    PT Home Exercise Plan  eval: heel slides and supine HS stretch; 3/1: seated knee flexion stretch, quad set; 3/6: standing calf stretch, SAQ; 3/18: prone quad stretch  with belt; 3/29: prone knee hang       Patient will benefit from skilled therapeutic intervention in order to improve the following deficits and impairments:  Abnormal gait, Decreased activity tolerance, Decreased balance, Decreased mobility, Decreased range of motion, Decreased scar mobility, Decreased strength, Difficulty walking, Hypomobility, Increased edema, Increased fascial restricitons, Increased muscle spasms, Impaired flexibility, Improper body mechanics, Pain, Decreased endurance  Visit Diagnosis: Stiffness of right knee, not elsewhere classified  Localized edema  Difficulty in walking, not elsewhere classified  Muscle weakness (generalized)     Problem List Patient Active Problem List   Diagnosis Date Noted  . Primary osteoarthritis of right knee 07/30/2017  . S/P TKR (total knee replacement) using cement, right 07/30/2017  . Colon cancer screening 10/28/2013  . Umbilical hernia without mention of obstruction or gangrene 10/28/2013   Ihor Austin, LPTA; [place]  Aldona Lento 10/15/2017, 12:24 PM  [facility]  [place], [place], [address] Phone: [phone]   Fax:  [phone]  Name: [name] MRN: [id number] Date of Birth: 06/24/1956

## 2017-10-16 ENCOUNTER — Ambulatory Visit (HOSPITAL_COMMUNITY): Payer: BLUE CROSS/BLUE SHIELD

## 2017-10-16 ENCOUNTER — Encounter (HOSPITAL_COMMUNITY): Payer: Self-pay

## 2017-10-16 DIAGNOSIS — M25661 Stiffness of right knee, not elsewhere classified: Secondary | ICD-10-CM | POA: Diagnosis not present

## 2017-10-16 DIAGNOSIS — R262 Difficulty in walking, not elsewhere classified: Secondary | ICD-10-CM

## 2017-10-16 DIAGNOSIS — M6281 Muscle weakness (generalized): Secondary | ICD-10-CM

## 2017-10-16 DIAGNOSIS — R600 Localized edema: Secondary | ICD-10-CM

## 2017-10-16 NOTE — Therapy (Signed)
[place], [place], [address] Phone: [phone]   Fax:  [phone]  Physical Therapy Treatment  Patient Details  Name: [name] MRN: [id number] Date of Birth: Apr 30, 1956 Referring Provider: Joni Fears MD   Encounter Date: 10/16/2017  PT End of Session - 10/16/17 1120    Visit Number  19    Number of Visits  28 +9 3x/3weeks per reassessment 4/10    Date for PT Re-Evaluation  10/30/17    Authorization Type  BCBS Other    Authorization Time Period  08/28/17 to 10/09/17; 4/10-5/1     PT Start Time  1116    PT Stop Time  1205    PT Time Calculation (min)  49 min    Activity Tolerance  Patient tolerated treatment well;No increased pain    Behavior During Therapy  WFL for tasks assessed/performed       Past Medical History:  Diagnosis Date  . Arthritis    "right knee" (07/30/2017)  . CAP (community acquired pneumonia) 2018  . Hypertension   . Hypertension   . Wears glasses     Past Surgical History:  Procedure Laterality Date  . EYE SURGERY    . JOINT REPLACEMENT    . RETINAL DETACHMENT SURGERY Left 2000s  . TOTAL KNEE ARTHROPLASTY Right 07/30/2017  . TOTAL KNEE ARTHROPLASTY Right 07/30/2017   Procedure: RIGHT TOTAL KNEE ARTHROPLASTY;  Surgeon: [name], MD;  Location: [facility];  Service: Orthopedics;  Laterality: Right;  Marland Kitchen VASECTOMY      There were no vitals filed for this visit.  Subjective Assessment - 10/16/17 1120    Subjective  Pt reports that he feels good today. He thinks his knee hasn't swollen recently. He is not in his compression garment currently stating he needed a break from it.    Patient Stated Goals  get back close to normal    Currently in Pain?  No/denies           [facility] Adult PT Treatment/Exercise - 10/16/17 0001      Knee/Hip Exercises: Aerobic   Nustep  30mins, L3, AA/ROM knee flexion, No UE, seat on 8      Knee/Hip Exercises: Machines for Strengthening    Other Machine  Biodex PROM: end % 92% flexion and extension      Knee/Hip Exercises: Standing   Walking with Sports Cord  gait with SPC x1 lap focusing on sequencing and heel strike    Gait Training  x257ft with RW with cues for heel to toe    Other Standing Knee Exercises  retro gait 28ft x2RT (education on where to push off toes behind      Knee/Hip Exercises: Seated   Long Arc Quad  Right;10 reps;Limitations    Long Arc Quad Limitations  3#, 3" holds      Knee/Hip Exercises: Supine   Short Arc [employer]  Right;10 reps    Short Arc Quad Sets Limitations  3#, 5" holds    Knee Extension Limitations  17 AROM    Knee Flexion Limitations  AROM 95 AAROM 98    Other Supine Knee/Hip Exercises  Over pressure for knee extension with foot on bolster 3x30" holds      Knee/Hip Exercises: Prone   Contract/Relax to Increase Flexion  3x gentle for flexion      Manual Therapy   Manual Therapy  Joint mobilization    Manual therapy comments  completed rest of  treatment    Joint Mobilization  Grade IV AP and PA knee joint mobs for flexion and extension ROM          PT Education - 10/16/17 1121    Education provided  Yes    Education Details  exercise technique, gait with SPC; gait sequencing    Person(s) Educated  Patient    Methods  Explanation;Demonstration    Comprehension  Verbalized understanding;Need further instruction;Returned demonstration       PT Short Term Goals - 10/10/17 1000      PT SHORT TERM GOAL #1   Title  Pt will be independent with HEP and perform consistently in order to decrease pain and maximize ROM.    Baseline  3/20: reports compliance with HEP 2x daily    Time  3    Period  Weeks    Status  Achieved      PT SHORT TERM GOAL #2   Title  Pt will have improved R knee AROM from 5-95 deg in order to decrease pain and maximize gait.    Baseline  3/20: 14-86deg; 4/10: 16-86    Time  3    Period  Weeks    Status  On-going    Target Date  10/30/17      PT  SHORT TERM GOAL #3   Title  Pt will have decreased joint line edema by 4cm in order to decrease pain and maximize ROM.    Baseline  3/20: 42.75cm at joint line (measured over compresison garment); 4/10: 47.8 cm at joint line (measured over compresison garment)    Time  3    Period  Weeks    Status  On-going      PT SHORT TERM GOAL #4   Title  Pt will be able to perform bil SLS for at least 10 sec with no UE in order to maximize gait and balance.    Baseline  3/20: 1 sec on R with 1HHA, 1 sec on L with 1 fingertip assist; 4/10: Rt 0 sec; Lt 2 sec    Time  3    Period  Weeks    Status  On-going        PT Long Term Goals - 10/10/17 1002      PT LONG TERM GOAL #1   Title  Pt will have improved R knee AROM to 0-115deg in order to maximize gait, stair ambulation, and overall return to PLOF.    Baseline  3/20: 14-86deg; 4/10: 16-86    Time  6    Period  Weeks    Status  On-going    Target Date  10/30/17      PT LONG TERM GOAL #2   Title  Pt will have improved MMT of all muscle groups tested by 1 grade or > in order to maximize gait, balance, and overall functional strength.    Baseline  3/20: see MMT; 4/10: see MMT    Time  3    Period  Weeks    Status  On-going    Target Date  10/30/17      PT LONG TERM GOAL #3   Title  Pt will be able to perform 5xSTS in 12 sec or < with proper mechanics and no UE support to demo improved functional BLE strength and balance.    Baseline  3/20: 18.8sec, RLE extended; 4/10: 22 sec    Time  3    Period  Weeks    Status  On-going      PT LONG TERM GOAL #4   Title  Pt will be able to perform the TUG with LRAD in 10 sec or < to demo improved functional strength, balance, and maximize community ambulation.    Baseline  3/20: 16.6sec, RW; 4/10: 15 sec RW    Time  3    Period  Weeks    Status  On-going      PT LONG TERM GOAL #5   Title  Pt will have improved 3MWT by 166ft or > with LRAD and gait WFL in order to demo improved functional strength,  gait, and maximize community access.    Baseline  3/20: 454ft RW, gait deviations continued 4/10: 602 ft RW gait deviations continue    Time  3    Period  Weeks    Status  On-going            Plan - 10/16/17 1222    Clinical Impression Statement  Continued with established POC focusing on improving R knee mobility. PT trialed gait with SPC this date and pt actually able to attain more upright posturing and decreased trunk flexion. Max cues still required for proper heel strike and gait mechanics with SPC, but feel pt would benefit from transition to [facility] to begin to normalize gait pattern and promote functional RLE strength. AROM still significantly limited as he was 17-95deg AROM this date with AAROM flexion to 98deg. PT educated pt to donn his compression garment when he got home as he still had mild pitting edema around distal knee joint following manual joint mobs; he verbalized understanding. Continue as planned, progressing as able.    Rehab Potential  Poor    PT Frequency  3x / week    PT Duration  3 weeks    PT Treatment/Interventions  ADLs/Self Care Home Management;Cryotherapy;Electrical Stimulation;Moist Heat;Ultrasound;DME Instruction;Gait training;Stair training;Functional mobility training;Therapeutic activities;Therapeutic exercise;Balance training;Neuromuscular re-education;Patient/family education;Manual techniques;Scar mobilization;Passive range of motion;Dry needling;Energy conservation;Taping;Biofeedback    PT Next Visit Plan  Continue with Biodex and begin more aggressive ROM per MD. continue gait training with SPC. Continue prone knee hang +manual to HS; careful with aggressive overpressure during stretching d/t historical acute increase in edema and pain; continue to focus on mobility, progressing very slowly.  Continue to educate on edema control.    PT Home Exercise Plan  eval: heel slides and supine HS stretch; 3/1: seated knee flexion stretch, quad set; 3/6: standing calf  stretch, SAQ; 3/18: prone quad stretch with belt; 3/29: prone knee hang    Consulted and Agree with Plan of Care  Patient       Patient will benefit from skilled therapeutic intervention in order to improve the following deficits and impairments:  Abnormal gait, Decreased activity tolerance, Decreased balance, Decreased mobility, Decreased range of motion, Decreased scar mobility, Decreased strength, Difficulty walking, Hypomobility, Increased edema, Increased fascial restricitons, Increased muscle spasms, Impaired flexibility, Improper body mechanics, Pain, Decreased endurance  Visit Diagnosis: Stiffness of right knee, not elsewhere classified  Localized edema  Difficulty in walking, not elsewhere classified  Muscle weakness (generalized)     Problem List Patient Active Problem List   Diagnosis Date Noted  . Primary osteoarthritis of right knee 07/30/2017  . S/P TKR (total knee replacement) using cement, right 07/30/2017  . Colon cancer screening 10/28/2013  . Umbilical hernia without mention of obstruction or gangrene 10/28/2013       Geraldine Solar PT, DPT  [address]  [place], [place], [address] Phone: [phone]   Fax:  [phone]  Name: [name] MRN: [id number] Date of Birth: [birth year]

## 2017-10-18 ENCOUNTER — Encounter (HOSPITAL_COMMUNITY): Payer: Self-pay

## 2017-10-18 ENCOUNTER — Ambulatory Visit (HOSPITAL_COMMUNITY): Payer: BLUE CROSS/BLUE SHIELD

## 2017-10-18 DIAGNOSIS — R600 Localized edema: Secondary | ICD-10-CM

## 2017-10-18 DIAGNOSIS — M25661 Stiffness of right knee, not elsewhere classified: Secondary | ICD-10-CM

## 2017-10-18 DIAGNOSIS — M6281 Muscle weakness (generalized): Secondary | ICD-10-CM

## 2017-10-18 DIAGNOSIS — R262 Difficulty in walking, not elsewhere classified: Secondary | ICD-10-CM

## 2017-10-18 NOTE — Therapy (Signed)
[facility] [address] [place], [place], [address] Phone: [phone]   Fax:  [phone]  Physical Therapy Treatment  Patient Details  Name: [name] MRN: [id number] Date of Birth: 11/30/1955 Referring Provider: Joni Fears MD   Encounter Date: 10/18/2017  PT End of Session - 10/18/17 0949    Visit Number  20    Number of Visits  28 +9 3x/3weeks per reassessment 4/10    Date for PT Re-Evaluation  10/30/17    Authorization Type  BC[employer] Other    Authorization Time Period  08/28/17 to 10/09/17; 4/10-5/1     PT Start Time  0945    PT Stop Time  1028    PT Time Calculation (min)  43 min    Activity Tolerance  Patient tolerated treatment well;No increased pain    Behavior During Therapy  WFL for tasks assessed/performed       Past Medical History:  Diagnosis Date  . Arthritis    "right knee" (07/30/2017)  . CAP (community acquired pneumonia) 2018  . Hypertension   . Hypertension   . Wears glasses     Past Surgical History:  Procedure Laterality Date  . EYE SURGERY    . JOINT REPLACEMENT    . RETINAL DETACHMENT SURGERY Left 2000s  . TOTAL KNEE ARTHROPLASTY Right 07/30/2017  . TOTAL KNEE ARTHROPLASTY Right 07/30/2017   Procedure: RIGHT TOTAL KNEE ARTHROPLASTY;  Surgeon: [name], MD;  Location: [facility];  Service: Orthopedics;  Laterality: Right;  Marland Kitchen VASECTOMY      There were no vitals filed for this visit.  Subjective Assessment - 10/18/17 0950    Subjective  Pt reports that he's feeling pretty good. He has his [facility] and he likes it much better.     Patient Stated Goals  get back close to normal    Currently in Pain?  No/denies           [facility] Adult PT Treatment/Exercise - 10/18/17 0001      Knee/Hip Exercises: Aerobic   Nustep  23mins, L3, AA/ROM knee flexion, No UE, seat on 7      Knee/Hip Exercises: Machines for Strengthening   Other Machine  Biodex PROM: end % 92% flexion and extension      Knee/Hip Exercises: Standing   Terminal Knee Extension Limitations  15x5" holds, small pink ball against wall    Lateral Step Up  Right;10 reps;Hand Hold: 2;Step Height: 4";Limitations    Lateral Step Up Limitations  cues for TKE at top    Gait Training  gait with SPC x1 lap focusing on sequencing and heel strike      Knee/Hip Exercises: Seated   Long Arc Quad  Right;10 reps    Long Arc Quad Limitations  5#, 3" holds      Knee/Hip Exercises: Supine   Other Supine Knee/Hip Exercises  Over pressure for knee extension with foot on bolster 3x30" holds      Manual Therapy   Manual Therapy  Joint mobilization    Manual therapy comments  completed rest of treatment    Joint Mobilization  Grade IV AP and PA knee joint mobs for flexion and extension ROM         PT Education - 10/18/17 0949    Education provided  Yes    Education Details  gait wtih SPC, continue HEP, really focusing on ROM    Person(s) Educated  Patient    Methods  Explanation;Demonstration  Comprehension  Verbalized understanding;Returned demonstration       PT Short Term Goals - 10/10/17 1000      PT SHORT TERM GOAL #1   Title  Pt will be independent with HEP and perform consistently in order to decrease pain and maximize ROM.    Baseline  3/20: reports compliance with HEP 2x daily    Time  3    Period  Weeks    Status  Achieved      PT SHORT TERM GOAL #2   Title  Pt will have improved R knee AROM from 5-95 deg in order to decrease pain and maximize gait.    Baseline  3/20: 14-86deg; 4/10: 16-86    Time  3    Period  Weeks    Status  On-going    Target Date  10/30/17      PT SHORT TERM GOAL #3   Title  Pt will have decreased joint line edema by 4cm in order to decrease pain and maximize ROM.    Baseline  3/20: 42.75cm at joint line (measured over compresison garment); 4/10: 47.8 cm at joint line (measured over compresison garment)    Time  3    Period  Weeks    Status  On-going      PT SHORT TERM GOAL  #4   Title  Pt will be able to perform bil SLS for at least 10 sec with no UE in order to maximize gait and balance.    Baseline  3/20: 1 sec on R with 1HHA, 1 sec on L with 1 fingertip assist; 4/10: Rt 0 sec; Lt 2 sec    Time  3    Period  Weeks    Status  On-going        PT Long Term Goals - 10/10/17 1002      PT LONG TERM GOAL #1   Title  Pt will have improved R knee AROM to 0-115deg in order to maximize gait, stair ambulation, and overall return to PLOF.    Baseline  3/20: 14-86deg; 4/10: 16-86    Time  6    Period  Weeks    Status  On-going    Target Date  10/30/17      PT LONG TERM GOAL #2   Title  Pt will have improved MMT of all muscle groups tested by 1 grade or > in order to maximize gait, balance, and overall functional strength.    Baseline  3/20: see MMT; 4/10: see MMT    Time  3    Period  Weeks    Status  On-going    Target Date  10/30/17      PT LONG TERM GOAL #3   Title  Pt will be able to perform 5xSTS in 12 sec or < with proper mechanics and no UE support to demo improved functional BLE strength and balance.    Baseline  3/20: 18.8sec, RLE extended; 4/10: 22 sec    Time  3    Period  Weeks    Status  On-going      PT LONG TERM GOAL #4   Title  Pt will be able to perform the TUG with LRAD in 10 sec or < to demo improved functional strength, balance, and maximize community ambulation.    Baseline  3/20: 16.6sec, RW; 4/10: 15 sec RW    Time  3    Period  Weeks    Status  On-going  PT LONG TERM GOAL #5   Title  Pt will have improved 3MWT by 152ft or > with LRAD and gait WFL in order to demo improved functional strength, gait, and maximize community access.    Baseline  3/20: 465ft RW, gait deviations continued 4/10: 602 ft RW gait deviations continue    Time  3    Period  Weeks    Status  On-going            Plan - 10/18/17 1030    Clinical Impression Statement  Pt presents to therapy with his SPC this date; PT adjusted height of it as it  was slightly too high. He continues to demo decreased heel strike, but his posture is improved with the gait. Continued with BIODEX for PROM. Added standing TKE against wall with ball to ensure proper quad contraction and decrease gluteal compensation. Also added lateral step ups this date for quad strengthening in hopes of improved knee extension ROM; he tolerated all new additions well, not reporting any pain. Ended with manual joint mobs for improved ROM. Pt 14-86deg this date; decrease in flexion could be due to rainy weather and pt donning compression garment this date.     Rehab Potential  Poor    PT Frequency  3x / week    PT Duration  3 weeks    PT Treatment/Interventions  ADLs/Self Care Home Management;Cryotherapy;Electrical Stimulation;Moist Heat;Ultrasound;DME Instruction;Gait training;Stair training;Functional mobility training;Therapeutic activities;Therapeutic exercise;Balance training;Neuromuscular re-education;Patient/family education;Manual techniques;Scar mobilization;Passive range of motion;Dry needling;Energy conservation;Taping;Biofeedback    PT Next Visit Plan  Continue with Biodex and begin more aggressive ROM per MD. continue gait training with SPC. Continue prone knee hang +manual to HS; careful with aggressive overpressure during stretching d/t historical acute increase in edema and pain; continue to focus on mobility, progressing very slowly.  Continue to educate on edema control.    PT Home Exercise Plan  eval: heel slides and supine HS stretch; 3/1: seated knee flexion stretch, quad set; 3/6: standing calf stretch, SAQ; 3/18: prone quad stretch with belt; 3/29: prone knee hang    Consulted and Agree with Plan of Care  Patient       Patient will benefit from skilled therapeutic intervention in order to improve the following deficits and impairments:  Abnormal gait, Decreased activity tolerance, Decreased balance, Decreased mobility, Decreased range of motion, Decreased scar  mobility, Decreased strength, Difficulty walking, Hypomobility, Increased edema, Increased fascial restricitons, Increased muscle spasms, Impaired flexibility, Improper body mechanics, Pain, Decreased endurance  Visit Diagnosis: Stiffness of right knee, not elsewhere classified  Localized edema  Difficulty in walking, not elsewhere classified  Muscle weakness (generalized)     Problem List Patient Active Problem List   Diagnosis Date Noted  . Primary osteoarthritis of right knee 07/30/2017  . S/P TKR (total knee replacement) using cement, right 07/30/2017  . Colon cancer screening 10/28/2013  . Umbilical hernia without mention of obstruction or gangrene 10/28/2013        Geraldine Solar PT, DPT  [facility] [address] [place], [place], [address] Phone: [phone]   Fax:  [phone]  Name: MICHAELJAMES MILNES MRN: [id number] Date of Birth: 02-09-1956

## 2017-10-22 ENCOUNTER — Encounter (HOSPITAL_COMMUNITY): Payer: Self-pay

## 2017-10-22 ENCOUNTER — Ambulatory Visit (HOSPITAL_COMMUNITY): Payer: BLUE CROSS/BLUE SHIELD

## 2017-10-22 DIAGNOSIS — M6281 Muscle weakness (generalized): Secondary | ICD-10-CM

## 2017-10-22 DIAGNOSIS — R262 Difficulty in walking, not elsewhere classified: Secondary | ICD-10-CM

## 2017-10-22 DIAGNOSIS — M25661 Stiffness of right knee, not elsewhere classified: Secondary | ICD-10-CM | POA: Diagnosis not present

## 2017-10-22 DIAGNOSIS — R600 Localized edema: Secondary | ICD-10-CM

## 2017-10-22 NOTE — Therapy (Signed)
[facility] [address] [place], [place], [address] Phone: [phone]   Fax:  [phone]  Physical Therapy Treatment  Patient Details  Name: [name] MRN: [id number] Date of Birth: 02/11/1956 Referring Provider: Joni Fears MD   Encounter Date: 10/22/2017  PT End of Session - 10/22/17 1118    Visit Number  21    Number of Visits  28 +9 3x/3weeks per reassessment 4/10    Date for PT Re-Evaluation  10/30/17    Authorization Type  [employer] Other    Authorization Time Period  08/28/17 to 10/09/17; 4/10-5/1     PT Start Time  1115    PT Stop Time  1205    PT Time Calculation (min)  50 min    Activity Tolerance  Patient tolerated treatment well;No increased pain    Behavior During Therapy  WFL for tasks assessed/performed       Past Medical History:  Diagnosis Date  . Arthritis    "right knee" (07/30/2017)  . CAP (community acquired pneumonia) 2018  . Hypertension   . Hypertension   . Wears glasses     Past Surgical History:  Procedure Laterality Date  . EYE SURGERY    . JOINT REPLACEMENT    . RETINAL DETACHMENT SURGERY Left 2000s  . TOTAL KNEE ARTHROPLASTY Right 07/30/2017  . TOTAL KNEE ARTHROPLASTY Right 07/30/2017   Procedure: RIGHT TOTAL KNEE ARTHROPLASTY;  Surgeon: [name], MD;  Location: [facility];  Service: Orthopedics;  Laterality: Right;  Marland Kitchen VASECTOMY      There were no vitals filed for this visit.  Subjective Assessment - 10/22/17 1119    Subjective  Pt states that he does not have any pain this morning. He feels like he is doing well with his SPC.    Patient Stated Goals  get back close to normal    Currently in Pain?  No/denies           [facility] PT Assessment - 10/22/17 0001      Circumferential Edema   Circumferential - Right  41.5cm joint line         [facility] Adult PT Treatment/Exercise - 10/22/17 0001      Knee/Hip Exercises: Stretches   Passive Hamstring Stretch  Right    Passive  Hamstring Stretch Limitations  in supine, manually from therapist    Quad Stretch Limitations  in prone, x2 mins sustained quad stretch manually by PT, increaing stretch as tolerated    Knee: Self-Stretch to increase Flexion  Right    Knee: Self-Stretch Limitations  10x10", standing on 12" box    Gastroc Stretch  Both;2 reps;30 seconds    Gastroc Stretch Limitations  slant board      Knee/Hip Exercises: Aerobic   Nustep  104mins, L3, AA/ROM knee flexion, No UE, seat on 7      Knee/Hip Exercises: Machines for Strengthening   Other Machine  Biodex PROM: end % 94% flexion and extension      Knee/Hip Exercises: Standing   Terminal Knee Extension Limitations  15x5" holds, small pink ball against wall    Lateral Step Up  Right;10 reps;Hand Hold: 2;Step Height: 4";Limitations    Lateral Step Up Limitations  cues for TKE at top and cues to decrease trunk flexion      Knee/Hip Exercises: Supine   Knee Extension Limitations  15    Knee Flexion Limitations  97  PT Education - 10/22/17 1118    Education provided  Yes    Education Details  heel strike in gait with SPC, continue HEP; continue compression garment    Person(s) Educated  Patient    Methods  Explanation;Demonstration    Comprehension  Verbalized understanding;Returned demonstration       PT Short Term Goals - 10/10/17 1000      PT SHORT TERM GOAL #1   Title  Pt will be independent with HEP and perform consistently in order to decrease pain and maximize ROM.    Baseline  3/20: reports compliance with HEP 2x daily    Time  3    Period  Weeks    Status  Achieved      PT SHORT TERM GOAL #2   Title  Pt will have improved R knee AROM from 5-95 deg in order to decrease pain and maximize gait.    Baseline  3/20: 14-86deg; 4/10: 16-86    Time  3    Period  Weeks    Status  On-going    Target Date  10/30/17      PT SHORT TERM GOAL #3   Title  Pt will have decreased joint line edema by 4cm in order to decrease pain  and maximize ROM.    Baseline  3/20: 42.75cm at joint line (measured over compresison garment); 4/10: 47.8 cm at joint line (measured over compresison garment)    Time  3    Period  Weeks    Status  On-going      PT SHORT TERM GOAL #4   Title  Pt will be able to perform bil SLS for at least 10 sec with no UE in order to maximize gait and balance.    Baseline  3/20: 1 sec on R with 1HHA, 1 sec on L with 1 fingertip assist; 4/10: Rt 0 sec; Lt 2 sec    Time  3    Period  Weeks    Status  On-going        PT Long Term Goals - 10/10/17 1002      PT LONG TERM GOAL #1   Title  Pt will have improved R knee AROM to 0-115deg in order to maximize gait, stair ambulation, and overall return to PLOF.    Baseline  3/20: 14-86deg; 4/10: 16-86    Time  6    Period  Weeks    Status  On-going    Target Date  10/30/17      PT LONG TERM GOAL #2   Title  Pt will have improved MMT of all muscle groups tested by 1 grade or > in order to maximize gait, balance, and overall functional strength.    Baseline  3/20: see MMT; 4/10: see MMT    Time  3    Period  Weeks    Status  On-going    Target Date  10/30/17      PT LONG TERM GOAL #3   Title  Pt will be able to perform 5xSTS in 12 sec or < with proper mechanics and no UE support to demo improved functional BLE strength and balance.    Baseline  3/20: 18.8sec, RLE extended; 4/10: 22 sec    Time  3    Period  Weeks    Status  On-going      PT LONG TERM GOAL #4   Title  Pt will be able to perform the TUG with LRAD in  10 sec or < to demo improved functional strength, balance, and maximize community ambulation.    Baseline  3/20: 16.6sec, RW; 4/10: 15 sec RW    Time  3    Period  Weeks    Status  On-going      PT LONG TERM GOAL #5   Title  Pt will have improved 3MWT by 19ft or > with LRAD and gait WFL in order to demo improved functional strength, gait, and maximize community access.    Baseline  3/20: 437ft RW, gait deviations continued 4/10: 602  ft RW gait deviations continue    Time  3    Period  Weeks    Status  On-going            Plan - 10/22/17 1207    Clinical Impression Statement  Continued with established POC focusing on knee mobility for flexion and extension. Resumed standing stretching exercises and pt noted to obtain grossly 90 deg with knee flexion drives and still lacked approximately 10-15deg of knee extension during calf stretching. Continued with trial of performing lateral step ups for quad strengthening for overall improved knee extension ROM; cues for proper form required and perform TKE to obtain full extension, though he was still in 10-15deg knee flexion at top. He continues to demo poor quad set contraction during extension ROM measurements but is able to perform active SAQ and LAQ; unsure of what is causing the discrepancy. AROM at end of session 17-97 deg, a slight improvement in flexion ROM from previous sessions, with extension remaining about the same. Continue heavy focus on mobility of R knee, progressing as able.    Rehab Potential  Poor    PT Frequency  3x / week    PT Duration  3 weeks    PT Treatment/Interventions  ADLs/Self Care Home Management;Cryotherapy;Electrical Stimulation;Moist Heat;Ultrasound;DME Instruction;Gait training;Stair training;Functional mobility training;Therapeutic activities;Therapeutic exercise;Balance training;Neuromuscular re-education;Patient/family education;Manual techniques;Scar mobilization;Passive range of motion;Dry needling;Energy conservation;Taping;Biofeedback    PT Next Visit Plan  Continue with Biodex and begin more aggressive ROM per MD. continue gait training with SPC. Continue prone knee hang +manual to HS; careful with aggressive overpressure during stretching d/t historical acute increase in edema and pain; continue to focus on mobility, progressing very slowly.  Continue to educate on edema control.    PT Home Exercise Plan  eval: heel slides and supine HS  stretch; 3/1: seated knee flexion stretch, quad set; 3/6: standing calf stretch, SAQ; 3/18: prone quad stretch with belt; 3/29: prone knee hang    Consulted and Agree with Plan of Care  Patient       Patient will benefit from skilled therapeutic intervention in order to improve the following deficits and impairments:  Abnormal gait, Decreased activity tolerance, Decreased balance, Decreased mobility, Decreased range of motion, Decreased scar mobility, Decreased strength, Difficulty walking, Hypomobility, Increased edema, Increased fascial restricitons, Increased muscle spasms, Impaired flexibility, Improper body mechanics, Pain, Decreased endurance  Visit Diagnosis: Stiffness of right knee, not elsewhere classified  Localized edema  Difficulty in walking, not elsewhere classified  Muscle weakness (generalized)     Problem List Patient Active Problem List   Diagnosis Date Noted  . Primary osteoarthritis of right knee 07/30/2017  . S/P TKR (total knee replacement) using cement, right 07/30/2017  . Colon cancer screening 10/28/2013  . Umbilical hernia without mention of obstruction or gangrene 10/28/2013       Geraldine Solar PT, DPT  [facility]  [place], [place], [address] Phone: [phone]   Fax:  [phone]  Name: [name] MRN: [id number] Date of Birth: 05/30/1956

## 2017-10-23 ENCOUNTER — Encounter (HOSPITAL_COMMUNITY): Payer: Self-pay

## 2017-10-23 ENCOUNTER — Ambulatory Visit (HOSPITAL_COMMUNITY): Payer: BLUE CROSS/BLUE SHIELD

## 2017-10-23 DIAGNOSIS — M25661 Stiffness of right knee, not elsewhere classified: Secondary | ICD-10-CM | POA: Diagnosis not present

## 2017-10-23 DIAGNOSIS — M6281 Muscle weakness (generalized): Secondary | ICD-10-CM

## 2017-10-23 DIAGNOSIS — R600 Localized edema: Secondary | ICD-10-CM

## 2017-10-23 DIAGNOSIS — R262 Difficulty in walking, not elsewhere classified: Secondary | ICD-10-CM

## 2017-10-23 NOTE — Therapy (Signed)
[facility] [address] [place], [place], [address] Phone: [phone]   Fax:  [phone]  Physical Therapy Treatment  Patient Details  Name: [name] MRN: [id number] Date of Birth: 1956-02-28 Referring Provider: Joni Fears MD   Encounter Date: 10/23/2017  PT End of Session - 10/23/17 1113    Visit Number  22    Number of Visits  28 +9 3x/3weeks per reassessment 4/10    Date for PT Re-Evaluation  10/30/17    Authorization Type  BC[employer] Other    Authorization Time Period  08/28/17 to 10/09/17; 4/10-5/1     PT Start Time  1113    PT Stop Time  1203    PT Time Calculation (min)  50 min    Activity Tolerance  Patient tolerated treatment well;No increased pain    Behavior During Therapy  WFL for tasks assessed/performed       Past Medical History:  Diagnosis Date  . Arthritis    "right knee" (07/30/2017)  . CAP (community acquired pneumonia) 2018  . Hypertension   . Hypertension   . Wears glasses     Past Surgical History:  Procedure Laterality Date  . EYE SURGERY    . JOINT REPLACEMENT    . RETINAL DETACHMENT SURGERY Left 2000s  . TOTAL KNEE ARTHROPLASTY Right 07/30/2017  . TOTAL KNEE ARTHROPLASTY Right 07/30/2017   Procedure: RIGHT TOTAL KNEE ARTHROPLASTY;  Surgeon: [name], MD;  Location: [facility];  Service: Orthopedics;  Laterality: Right;  Marland Kitchen VASECTOMY      There were no vitals filed for this visit.  Subjective Assessment - 10/23/17 1113    Subjective  Pt states that he was tired from his treatment session yesterday. No pain currently. He had to take his granddaughter to the hospital because she was having an asthma attack but she's okay; he just did a lot of walking.    Patient Stated Goals  get back close to normal    Currently in Pain?  No/denies           [facility] Adult PT Treatment/Exercise - 10/23/17 0001      Knee/Hip Exercises: Stretches   Knee: Self-Stretch to increase Flexion  Right    Knee: Self-Stretch Limitations  10x10", standing on 12" box    Gastroc Stretch  Both;2 reps;30 seconds    Gastroc Stretch Limitations  at stairs on 7" step      Knee/Hip Exercises: Aerobic   Stationary Bike  x4 mins, seat 10, 1/2 revolutions for mobility      Knee/Hip Exercises: Standing   Knee Flexion  Right;15 reps;Limitations    Knee Flexion Limitations  cues for proper technique and to decrease hip flexion    Terminal Knee Extension Limitations  15x5" holds, small pink ball against wall    Lateral Step Up  Right;Hand Hold: 2;Step Height: 4"    Lateral Step Up Limitations  cues for TKE at top and cues to decrease trunk flexion    Step Down  Right;10 reps;Hand Hold: 2;Step Height: 4"      Knee/Hip Exercises: Seated   Long Arc Quad  Right;10 reps    Long Arc Quad Limitations  5#, 3" holds      Knee/Hip Exercises: Supine   Short Arc Quad Sets  Right;10 reps    Short Arc Quad Sets Limitations  5#, 3" holds    Knee Extension Limitations  16    Knee Flexion Limitations  94  Manual Therapy   Manual Therapy  Joint mobilization;Myofascial release;Edema management    Manual therapy comments  completed rest of treatment    Edema Management  retro massage with BLE elevated    Joint Mobilization  Grade IV AP and PA knee joint mobs for flexion and extension ROM    Myofascial Release  along proximal scar and distal quad to decrease adhesions            PT Education - 10/23/17 1117    Education provided  Yes    Education Details  proper gait mechanics, exercise technique, continue compression garment    Person(s) Educated  Patient    Methods  Explanation;Demonstration    Comprehension  Verbalized understanding;Returned demonstration;Need further instruction       PT Short Term Goals - 10/10/17 1000      PT SHORT TERM GOAL #1   Title  Pt will be independent with HEP and perform consistently in order to decrease pain and maximize ROM.    Baseline  3/20: reports compliance  with HEP 2x daily    Time  3    Period  Weeks    Status  Achieved      PT SHORT TERM GOAL #2   Title  Pt will have improved R knee AROM from 5-95 deg in order to decrease pain and maximize gait.    Baseline  3/20: 14-86deg; 4/10: 16-86    Time  3    Period  Weeks    Status  On-going    Target Date  10/30/17      PT SHORT TERM GOAL #3   Title  Pt will have decreased joint line edema by 4cm in order to decrease pain and maximize ROM.    Baseline  3/20: 42.75cm at joint line (measured over compresison garment); 4/10: 47.8 cm at joint line (measured over compresison garment)    Time  3    Period  Weeks    Status  On-going      PT SHORT TERM GOAL #4   Title  Pt will be able to perform bil SLS for at least 10 sec with no UE in order to maximize gait and balance.    Baseline  3/20: 1 sec on R with 1HHA, 1 sec on L with 1 fingertip assist; 4/10: Rt 0 sec; Lt 2 sec    Time  3    Period  Weeks    Status  On-going        PT Long Term Goals - 10/10/17 1002      PT LONG TERM GOAL #1   Title  Pt will have improved R knee AROM to 0-115deg in order to maximize gait, stair ambulation, and overall return to PLOF.    Baseline  3/20: 14-86deg; 4/10: 16-86    Time  6    Period  Weeks    Status  On-going    Target Date  10/30/17      PT LONG TERM GOAL #2   Title  Pt will have improved MMT of all muscle groups tested by 1 grade or > in order to maximize gait, balance, and overall functional strength.    Baseline  3/20: see MMT; 4/10: see MMT    Time  3    Period  Weeks    Status  On-going    Target Date  10/30/17      PT LONG TERM GOAL #3   Title  Pt will be able to  perform 5xSTS in 12 sec or < with proper mechanics and no UE support to demo improved functional BLE strength and balance.    Baseline  3/20: 18.8sec, RLE extended; 4/10: 22 sec    Time  3    Period  Weeks    Status  On-going      PT LONG TERM GOAL #4   Title  Pt will be able to perform the TUG with LRAD in 10 sec or < to  demo improved functional strength, balance, and maximize community ambulation.    Baseline  3/20: 16.6sec, RW; 4/10: 15 sec RW    Time  3    Period  Weeks    Status  On-going      PT LONG TERM GOAL #5   Title  Pt will have improved 3MWT by 161ft or > with LRAD and gait WFL in order to demo improved functional strength, gait, and maximize community access.    Baseline  3/20: 427ft RW, gait deviations continued 4/10: 602 ft RW gait deviations continue    Time  3    Period  Weeks    Status  On-going            Plan - 10/23/17 1206    Clinical Impression Statement  Started pt on the stationary bike this date versus Nustep for knee mobility; he was only able to do 1/2 revolutions due to limitations in mobility. Continued with standing mobility work and attempted to progress to lateral step ups on 6" step but pt with significantly impaired form, likely due to weakness. Did add fwd step down on RLE for functional knee flexion mobility work. All tolerated well, just min cues for proper exercise technique. Ended with manual for edema, soft tissue restrictions, and joint mobility. He continues to demo difficulty contracting quad during extension measurements, even with max verbal and tactile cueing. Did not perform biodex this date due to time constraints; will resume as needed. AROM 16 to 94deg this date.     Rehab Potential  Poor    PT Frequency  3x / week    PT Duration  3 weeks    PT Treatment/Interventions  ADLs/Self Care Home Management;Cryotherapy;Electrical Stimulation;Moist Heat;Ultrasound;DME Instruction;Gait training;Stair training;Functional mobility training;Therapeutic activities;Therapeutic exercise;Balance training;Neuromuscular re-education;Patient/family education;Manual techniques;Scar mobilization;Passive range of motion;Dry needling;Energy conservation;Taping;Biofeedback    PT Next Visit Plan  progress pt's mobility work; Continue with Biodex and begin more aggressive ROM per MD.  continue gait training with SPC. Continue prone knee hang +manual to HS; careful with aggressive overpressure during stretching d/t historical acute increase in edema and pain; continue to focus on mobility, progressing very slowly.  Continue to educate on edema control.    PT Home Exercise Plan  eval: heel slides and supine HS stretch; 3/1: seated knee flexion stretch, quad set; 3/6: standing calf stretch, SAQ; 3/18: prone quad stretch with belt; 3/29: prone knee hang    Consulted and Agree with Plan of Care  Patient       Patient will benefit from skilled therapeutic intervention in order to improve the following deficits and impairments:  Abnormal gait, Decreased activity tolerance, Decreased balance, Decreased mobility, Decreased range of motion, Decreased scar mobility, Decreased strength, Difficulty walking, Hypomobility, Increased edema, Increased fascial restricitons, Increased muscle spasms, Impaired flexibility, Improper body mechanics, Pain, Decreased endurance  Visit Diagnosis: Stiffness of right knee, not elsewhere classified  Localized edema  Difficulty in walking, not elsewhere classified  Muscle weakness (generalized)     Problem List  Patient Active Problem List   Diagnosis Date Noted  . Primary osteoarthritis of right knee 07/30/2017  . S/P TKR (total knee replacement) using cement, right 07/30/2017  . Colon cancer screening 10/28/2013  . Umbilical hernia without mention of obstruction or gangrene 10/28/2013        Geraldine Solar PT, DPT  [facility] [address] [place], [place], [address] Phone: [phone]   Fax:  [phone]  Name: [name] MRN: [id number] Date of Birth: May 13, 1956

## 2017-10-25 ENCOUNTER — Encounter (HOSPITAL_COMMUNITY): Payer: BLUE CROSS/BLUE SHIELD

## 2017-10-28 ENCOUNTER — Ambulatory Visit (HOSPITAL_COMMUNITY): Payer: BLUE CROSS/BLUE SHIELD

## 2017-10-28 ENCOUNTER — Encounter (HOSPITAL_COMMUNITY): Payer: Self-pay

## 2017-10-28 DIAGNOSIS — M25661 Stiffness of right knee, not elsewhere classified: Secondary | ICD-10-CM

## 2017-10-28 DIAGNOSIS — R600 Localized edema: Secondary | ICD-10-CM

## 2017-10-28 DIAGNOSIS — M6281 Muscle weakness (generalized): Secondary | ICD-10-CM

## 2017-10-28 DIAGNOSIS — R262 Difficulty in walking, not elsewhere classified: Secondary | ICD-10-CM

## 2017-10-28 NOTE — Therapy (Signed)
[facility] [address] [place], [place], [address] Phone: [phone]   Fax:  [phone]  Physical Therapy Treatment  Patient Details  Name: [name] MRN: [id number] Date of Birth: May 06, 1956 Referring Provider: Joni Fears MD   Encounter Date: 10/28/2017  PT End of Session - 10/28/17 1119    Visit Number  23    Number of Visits  28 +9 3x/3weeks per reassessment 4/10    Date for PT Re-Evaluation  10/30/17    Authorization Type  [employer]BS Other    Authorization Time Period  08/28/17 to 10/09/17; 4/10-5/1     PT Start Time  1116    PT Stop Time  1203    PT Time Calculation (min)  47 min    Activity Tolerance  Patient tolerated treatment well;No increased pain    Behavior During Therapy  WFL for tasks assessed/performed       Past Medical History:  Diagnosis Date  . Arthritis    "right knee" (07/30/2017)  . CAP (community acquired pneumonia) 2018  . Hypertension   . Hypertension   . Wears glasses     Past Surgical History:  Procedure Laterality Date  . EYE SURGERY    . JOINT REPLACEMENT    . RETINAL DETACHMENT SURGERY Left 2000s  . TOTAL KNEE ARTHROPLASTY Right 07/30/2017  . TOTAL KNEE ARTHROPLASTY Right 07/30/2017   Procedure: RIGHT TOTAL KNEE ARTHROPLASTY;  Surgeon: [name], MD;  Location: [facility];  Service: Orthopedics;  Laterality: Right;  Marland Kitchen VASECTOMY      There were no vitals filed for this visit.  Subjective Assessment - 10/28/17 1120    Subjective  Pt reports he had a great weekend. His knee did well all weekend long. He was able to get his yard mowed.    Patient Stated Goals  get back close to normal    Currently in Pain?  No/denies          [facility] PT Assessment - 10/28/17 0001      Circumferential Edema   Circumferential - Right  41cm joint line           [facility] Adult PT Treatment/Exercise - 10/28/17 0001      Knee/Hip Exercises: Stretches   Knee: Self-Stretch to increase Flexion   Right    Knee: Self-Stretch Limitations  10x10", standing on 12" box    Gastroc Stretch  Both;2 reps;30 seconds    Gastroc Stretch Limitations  slant board      Knee/Hip Exercises: Aerobic   Stationary Bike  x4 mins, seat 10, 1/2 revolutions for mobility      Knee/Hip Exercises: Standing   Terminal Knee Extension Limitations  15x5" holds, small pink ball against wall    Lateral Step Up  Right;15 reps;Hand Hold: 2;Step Height: 4"    Lateral Step Up Limitations  cues for TKE at top and cues to decrease trunk flexion    Step Down  Right;10 reps;Hand Hold: 2;Step Height: 4"      Knee/Hip Exercises: Seated   Long Arc Quad  Right;15 reps    Long Arc Quad Limitations  5#, 3" holds      Knee/Hip Exercises: Supine   Quad Sets  Right;10 reps    Quad Sets Limitations  +manual OP from PT for 10"    Short Arc [employer]  Right;15 reps    Short Arc Quad Sets Limitations  5#, 3" holds    Knee Extension Limitations  13  Knee Flexion Limitations  100      Manual Therapy   Manual Therapy  Joint mobilization;Myofascial release;Edema management    Manual therapy comments  completed rest of treatment    Edema Management  retro massage with BLE elevated    Joint Mobilization  Grade IV AP and PA knee joint mobs for flexion and extension ROM    Myofascial Release  along proximal scar and distal quad to decrease adhesions             PT Education - 10/28/17 1120    Education provided  Yes    Education Details  continue HEP and compression garment at home    Person(s) Educated  Patient    Methods  Explanation;Demonstration    Comprehension  Returned demonstration;Verbalized understanding       PT Short Term Goals - 10/10/17 1000      PT SHORT TERM GOAL #1   Title  Pt will be independent with HEP and perform consistently in order to decrease pain and maximize ROM.    Baseline  3/20: reports compliance with HEP 2x daily    Time  3    Period  Weeks    Status  Achieved      PT SHORT  TERM GOAL #2   Title  Pt will have improved R knee AROM from 5-95 deg in order to decrease pain and maximize gait.    Baseline  3/20: 14-86deg; 4/10: 16-86    Time  3    Period  Weeks    Status  On-going    Target Date  10/30/17      PT SHORT TERM GOAL #3   Title  Pt will have decreased joint line edema by 4cm in order to decrease pain and maximize ROM.    Baseline  3/20: 42.75cm at joint line (measured over compresison garment); 4/10: 47.8 cm at joint line (measured over compresison garment)    Time  3    Period  Weeks    Status  On-going      PT SHORT TERM GOAL #4   Title  Pt will be able to perform bil SLS for at least 10 sec with no UE in order to maximize gait and balance.    Baseline  3/20: 1 sec on R with 1HHA, 1 sec on L with 1 fingertip assist; 4/10: Rt 0 sec; Lt 2 sec    Time  3    Period  Weeks    Status  On-going        PT Long Term Goals - 10/10/17 1002      PT LONG TERM GOAL #1   Title  Pt will have improved R knee AROM to 0-115deg in order to maximize gait, stair ambulation, and overall return to PLOF.    Baseline  3/20: 14-86deg; 4/10: 16-86    Time  6    Period  Weeks    Status  On-going    Target Date  10/30/17      PT LONG TERM GOAL #2   Title  Pt will have improved MMT of all muscle groups tested by 1 grade or > in order to maximize gait, balance, and overall functional strength.    Baseline  3/20: see MMT; 4/10: see MMT    Time  3    Period  Weeks    Status  On-going    Target Date  10/30/17      PT LONG TERM GOAL #3  Title  Pt will be able to perform 5xSTS in 12 sec or < with proper mechanics and no UE support to demo improved functional BLE strength and balance.    Baseline  3/20: 18.8sec, RLE extended; 4/10: 22 sec    Time  3    Period  Weeks    Status  On-going      PT LONG TERM GOAL #4   Title  Pt will be able to perform the TUG with LRAD in 10 sec or < to demo improved functional strength, balance, and maximize community ambulation.     Baseline  3/20: 16.6sec, RW; 4/10: 15 sec RW    Time  3    Period  Weeks    Status  On-going      PT LONG TERM GOAL #5   Title  Pt will have improved 3MWT by 136ft or > with LRAD and gait WFL in order to demo improved functional strength, gait, and maximize community access.    Baseline  3/20: 492ft RW, gait deviations continued 4/10: 602 ft RW gait deviations continue    Time  3    Period  Weeks    Status  On-going            Plan - 10/28/17 1205    Clinical Impression Statement  Continued with established POC focusing on improving overall mobility. He continues to demo increased limitations in R knee extension and flexion. Pt with much improved quad set this date compared to previous sessions, though he still had difficulty holding the contraction for longer than 2 seconds. Lots of manual performed this date with OP during quad sets, joint mobs, edema management, and MFR for adhesions noted. AROM 13-100deg this date. Pt due for reassessment next visit.     Rehab Potential  Poor    PT Frequency  3x / week    PT Duration  3 weeks    PT Treatment/Interventions  ADLs/Self Care Home Management;Cryotherapy;Electrical Stimulation;Moist Heat;Ultrasound;DME Instruction;Gait training;Stair training;Functional mobility training;Therapeutic activities;Therapeutic exercise;Balance training;Neuromuscular re-education;Patient/family education;Manual techniques;Scar mobilization;Passive range of motion;Dry needling;Energy conservation;Taping;Biofeedback    PT Next Visit Plan  reassessment; progress pt's mobility work; Continue with Biodex and begin more aggressive ROM per MD. continue gait training with SPC. Continue prone knee hang +manual to HS; careful with aggressive overpressure during stretching d/t historical acute increase in edema and pain; continue to focus on mobility, progressing very slowly.  Continue to educate on edema control.    PT Home Exercise Plan  eval: heel slides and supine HS  stretch; 3/1: seated knee flexion stretch, quad set; 3/6: standing calf stretch, SAQ; 3/18: prone quad stretch with belt; 3/29: prone knee hang    Consulted and Agree with Plan of Care  Patient       Patient will benefit from skilled therapeutic intervention in order to improve the following deficits and impairments:  Abnormal gait, Decreased activity tolerance, Decreased balance, Decreased mobility, Decreased range of motion, Decreased scar mobility, Decreased strength, Difficulty walking, Hypomobility, Increased edema, Increased fascial restricitons, Increased muscle spasms, Impaired flexibility, Improper body mechanics, Pain, Decreased endurance  Visit Diagnosis: Stiffness of right knee, not elsewhere classified  Localized edema  Difficulty in walking, not elsewhere classified  Muscle weakness (generalized)     Problem List Patient Active Problem List   Diagnosis Date Noted  . Primary osteoarthritis of right knee 07/30/2017  . S/P TKR (total knee replacement) using cement, right 07/30/2017  . Colon cancer screening 10/28/2013  . Umbilical hernia without  mention of obstruction or gangrene 10/28/2013      . Geraldine Solar PT, [facility] [address] [place], [place], [address] Phone: [phone]   Fax:  [phone]  Name: [name] MRN: [id number] Date of Birth: [birth year]

## 2017-10-30 ENCOUNTER — Ambulatory Visit (HOSPITAL_COMMUNITY): Payer: BLUE CROSS/BLUE SHIELD | Attending: Internal Medicine

## 2017-10-30 ENCOUNTER — Encounter (HOSPITAL_COMMUNITY): Payer: Self-pay

## 2017-10-30 DIAGNOSIS — R262 Difficulty in walking, not elsewhere classified: Secondary | ICD-10-CM | POA: Diagnosis present

## 2017-10-30 DIAGNOSIS — R600 Localized edema: Secondary | ICD-10-CM

## 2017-10-30 DIAGNOSIS — M6281 Muscle weakness (generalized): Secondary | ICD-10-CM | POA: Diagnosis present

## 2017-10-30 DIAGNOSIS — M25661 Stiffness of right knee, not elsewhere classified: Secondary | ICD-10-CM

## 2017-10-30 NOTE — Therapy (Signed)
[place], [place], [address] Phone: [phone]   Fax:  [phone]    Progress Note Reporting Period 10/09/17 to 10/30/17  See note below for Objective Data and Assessment of Progress/Goals.     Physical Therapy Treatment/Reassessment  Patient Details  Name: [name] MRN: [id number] Date of Birth: April 23, 1956 Referring Provider: Joni Fears, MD   Encounter Date: 10/30/2017  PT End of Session - 10/30/17 1121    Visit Number  24    Number of Visits  28 +9 3x/3weeks per reassessment 4/10    Date for PT Re-Evaluation  10/30/17    Authorization Type  [employer] Other    Authorization Time Period  NEW: 10/30/17 to 11/11/17 (f/u visit following MD f/u appt on 11/06/17)    PT Start Time  1118    PT Stop Time  1201    PT Time Calculation (min)  43 min    Activity Tolerance  Patient tolerated treatment well;No increased pain    Behavior During Therapy  WFL for tasks assessed/performed       Past Medical History:  Diagnosis Date  . Arthritis    "right knee" (07/30/2017)  . CAP (community acquired pneumonia) 2018  . Hypertension   . Hypertension   . Wears glasses     Past Surgical History:  Procedure Laterality Date  . EYE SURGERY    . JOINT REPLACEMENT    . RETINAL DETACHMENT SURGERY Left 2000s  . TOTAL KNEE ARTHROPLASTY Right 07/30/2017  . TOTAL KNEE ARTHROPLASTY Right 07/30/2017   Procedure: RIGHT TOTAL KNEE ARTHROPLASTY;  Surgeon: [name], MD;  Location: [facility];  Service: Orthopedics;  Laterality: Right;  Marland Kitchen VASECTOMY      There were no vitals filed for this visit.  Subjective Assessment - 10/30/17 1122    Subjective  Pt states he's doing well. No knee pain. He sees his MD on 11/06/17.    Patient Stated Goals  get back close to normal    Currently in Pain?  No/denies         [facility] PT Assessment - 10/30/17 0001      Assessment   Medical Diagnosis  R TKA    Referring Provider  Joni Fears, MD    Onset Date/Surgical Date  07/30/17    Next MD Visit  11/06/17      Circumferential Edema   Circumferential - Right  41cm joint line was 41.5cm joint line 10/22/17      AROM   Right Knee Extension  15 was 16    Right Knee Flexion  95 was 86      Strength   Right Hip Extension  4/5 was 4-    Right Hip ABduction  4-/5 was 4-    Left Hip Extension  4/5 was 4    Left Hip ABduction  4+/5 was 4+    Right Knee Flexion  4-/5 was 4-    Right Knee Extension  5/5 through available range      Ambulation/Gait   Ambulation Distance (Feet)  440 Feet was 532ft with RW    Assistive device  Straight cane    Gait Pattern  Step-through pattern;Decreased stance time - right;Decreased hip/knee flexion - right;Trendelenburg;Trunk flexed decr heel strike, limp on RLE, decr knee flexion       Balance   Balance Assessed  Yes      Static Standing Balance   Static Standing - Balance Support  No upper extremity supported    Static Standing Balance -  Activities   Single Leg Stance - Right Leg;Single Leg Stance - Left Leg    Static Standing - Comment/# of Minutes  R: 2 sec L: 3 sec      Standardized Balance Assessment   Standardized Balance Assessment  Five Times Sit to Stand    Five times sit to stand comments   17 sec, no UE, RLE extended was 18.4sec from chair, no UE      Timed Up and Go Test   TUG  Normal TUG    Normal TUG (seconds)  -- was 15 sec with RW    TUG Comments  with [facility]         PT Education - 10/30/17 1121    Education provided  Yes    Education Details  reassessment findings, will extend cert to cover a f/u visit wtih PT after his f/u with Dr. Durward Fortes on 11/06/17; will establish future/furhter POC following that    Person(s) Educated  Patient    Methods  Explanation    Comprehension  Verbalized understanding       PT Short Term Goals - 10/30/17 1126      PT SHORT TERM GOAL #1   Title  Pt will be independent with HEP and perform consistently in order to decrease  pain and maximize ROM.    Baseline  3/20: reports compliance with HEP 2x daily    Time  3    Period  Weeks    Status  Achieved      PT SHORT TERM GOAL #2   Title  Pt will have improved R knee AROM from 5-95 deg in order to decrease pain and maximize gait.    Baseline  5/1: 15-95deg (was 16-86deg last reassessment)    Time  3    Period  Weeks    Status  On-going      PT SHORT TERM GOAL #3   Title  Pt will have decreased joint line edema by 4cm in order to decrease pain and maximize ROM.    Baseline  5/1: 41cm joint line (was 43.5cm at eval and 42.75cm on 3/20)    Time  3    Period  Weeks    Status  On-going      PT SHORT TERM GOAL #4   Title  Pt will be able to perform bil SLS for at least 10 sec with no UE in order to maximize gait and balance.    Baseline  4/20: 2 sec RLE, 3 sec LLE    Time  3    Period  Weeks    Status  On-going        PT Long Term Goals - 10/30/17 1127      PT LONG TERM GOAL #1   Title  Pt will have improved R knee AROM to 0-115deg in order to maximize gait, stair ambulation, and overall return to PLOF.    Baseline  5/1: 15-95deg (was 16-86deg last reassessment)    Time  6    Period  Weeks    Status  On-going      PT LONG TERM GOAL #2   Title  Pt will have improved MMT of all muscle groups tested by 1 grade or > in order to maximize gait, balance, and overall functional strength.    Baseline  5/1: see MMT    Time  3    Period  Weeks  Status  On-going      PT LONG TERM GOAL #3   Title  Pt will be able to perform 5xSTS in 12 sec or < with proper mechanics and no UE support to demo improved functional BLE strength and balance.    Baseline  5/1: 17sec, was 18.8s    Time  3    Period  Weeks    Status  On-going      PT LONG TERM GOAL #4   Title  Pt will be able to perform the TUG with LRAD in 10 sec or < to demo improved functional strength, balance, and maximize community ambulation.    Baseline  5/1: 15 sec SPC was 15 sec RW    Time  3     Period  Weeks    Status  On-going      PT LONG TERM GOAL #5   Title  Pt will have improved 3MWT by 163ft or > with LRAD and gait WFL in order to demo improved functional strength, gait, and maximize community access.    Baseline  5/1: 470ft with SPC, deviations continue; was 634ft RW gait deviations continue    Time  3    Period  Weeks    Status  On-going            Plan - 10/30/17 1213    Clinical Impression Statement  PT reassessed pt's goals and outcome measures this date. Pt has made minimal progress towards goals since his last reassessment. He was, however, able to perform the TUG and 3MWT with SPC this date compared to previous reassessments when he used RW. His AROM is still significantly impaired as he was 15-95deg this date (was able to get to 100deg last visit on 10/28/17) and he still presents with increased swelling via objective measurements and gross assessment. PT services up to this point have utilized a multitude of measures to attempt to improve pt's ROM and overall function AEB utilizing BIODEX machine, e-stim for quad contractions, manual therapy for joint and soft tissue restrictions, manual for edema control, obtaining compression garment for edema management, and recently an increased focus on quad strengthening has been implemented, all without major success in improving pt's knee ROM. At this time, PT recommends holding PT services until pt f/u with Dr. Durward Fortes on 11/06/17 in order to see what his plan is going forward with the pt; pt has f/u PT appointment scheduled for 11/11/17 after his MD appointment in order to establish any new PT POC going forward at that time.     Rehab Potential  Poor    PT Frequency  Other (comment) one f/u visit on 11/11/17 after check up with MD on 11/06/17    PT Treatment/Interventions  ADLs/Self Care Home Management;Cryotherapy;Electrical Stimulation;Moist Heat;Ultrasound;DME Instruction;Gait training;Stair training;Functional mobility  training;Therapeutic activities;Therapeutic exercise;Balance training;Neuromuscular re-education;Patient/family education;Manual techniques;Scar mobilization;Passive range of motion;Dry needling;Energy conservation;Taping;Biofeedback    PT Next Visit Plan  f/u with what Dr. Durward Fortes said and extend cert as needed from that point    PT Home Exercise Plan  eval: heel slides and supine HS stretch; 3/1: seated knee flexion stretch, quad set; 3/6: standing calf stretch, SAQ; 3/18: prone quad stretch with belt; 3/29: prone knee hang    Consulted and Agree with Plan of Care  Patient       Patient will benefit from skilled therapeutic intervention in order to improve the following deficits and impairments:  Abnormal gait, Decreased activity tolerance, Decreased balance, Decreased mobility, Decreased range  of motion, Decreased scar mobility, Decreased strength, Difficulty walking, Hypomobility, Increased edema, Increased fascial restricitons, Increased muscle spasms, Impaired flexibility, Improper body mechanics, Pain, Decreased endurance  Visit Diagnosis: Stiffness of right knee, not elsewhere classified - Plan: PT plan of care cert/re-cert  Localized edema - Plan: PT plan of care cert/re-cert  Difficulty in walking, not elsewhere classified - Plan: PT plan of care cert/re-cert  Muscle weakness (generalized) - Plan: PT plan of care cert/re-cert     Problem List Patient Active Problem List   Diagnosis Date Noted  . Primary osteoarthritis of right knee 07/30/2017  . S/P TKR (total knee replacement) using cement, right 07/30/2017  . Colon cancer screening 10/28/2013  . Umbilical hernia without mention of obstruction or gangrene 10/28/2013       Geraldine Solar PT, DPT  [facility] [address] [place], [place], [address] Phone: [phone]   Fax:  [phone]  Name: [name] MRN: [id number] Date of Birth: January 16, 1956

## 2017-11-06 ENCOUNTER — Encounter (INDEPENDENT_AMBULATORY_CARE_PROVIDER_SITE_OTHER): Payer: Self-pay | Admitting: Orthopaedic Surgery

## 2017-11-06 ENCOUNTER — Ambulatory Visit (INDEPENDENT_AMBULATORY_CARE_PROVIDER_SITE_OTHER): Payer: BLUE CROSS/BLUE SHIELD | Admitting: Orthopaedic Surgery

## 2017-11-06 VITALS — BP 151/77 | HR 65 | Resp 18 | Ht 67.0 in | Wt 240.0 lb

## 2017-11-06 DIAGNOSIS — Z96651 Presence of right artificial knee joint: Secondary | ICD-10-CM

## 2017-11-06 NOTE — Progress Notes (Signed)
Office Visit Note   Patient: [name]           Date of Birth: 20-Sep-1955           MRN: [id number] Visit Date: 11/06/2017              Requested by: Celene Squibb, MD [address] [place], [place] [address] PCP: Celene Squibb, MD   Assessment & Plan: Visit Diagnoses:  1. History of total right knee replacement     Plan: Just over 3 months status post primary right total knee replacement.  Pain is not the issue.  Range of motion and strength still a problem.  Still going to physical therapy and wants to continue.  I would agree is not ready to return to work for at least 2 months.  See him back at that time continue with therapy to work on strengthening and range of motion  Follow-Up Instructions: Return in about 2 months (around 01/06/2018).   Orders:  No orders of the defined types were placed in this encounter.  No orders of the defined types were placed in this encounter.     Procedures: No procedures performed   Clinical Data: No additional findings.   Subjective: Chief Complaint  Patient presents with  . Right Knee - Pain  . Follow-up    R KNEE F/U 07/30/17 SURGERY  Just over 3 months status post primary right total knee replacement.  Has not had any fever chills shortness of breath.  Still uses a cane.  Still going to physical therapy.  His job at [employer] requires him to bend stoop squat and lift.  Is not ready for that yet. HPI  Review of Systems  Constitutional: Negative for fatigue and fever.  HENT: Negative for ear pain.   Eyes: Negative for pain.  Respiratory: Negative for cough and shortness of breath.   Cardiovascular: Positive for leg swelling.  Gastrointestinal: Negative for constipation and diarrhea.  Genitourinary: Negative for difficulty urinating.  Musculoskeletal: Negative for back pain and neck pain.  Skin: Negative for rash.  Allergic/Immunologic: Negative for food allergies.  Neurological: Positive for weakness.  Negative for numbness.  Hematological: Does not bruise/bleed easily.  Psychiatric/Behavioral: Positive for sleep disturbance.     Objective: Vital Signs: BP (!) 151/77 (BP Location: Right Arm, Patient Position: Sitting, Cuff Size: Normal)   Pulse 65   Resp 18   Ht 5\' 7"  (1.702 m)   Wt 240 lb (108.9 kg)   BMI 37.59 kg/m   Physical Exam  Constitutional: He is oriented to person, place, and time. He appears well-developed and well-nourished.  HENT:  Mouth/Throat: Oropharynx is clear and moist.  Eyes: Pupils are equal, round, and reactive to light. EOM are normal.  Pulmonary/Chest: Effort normal.  Neurological: He is alert and oriented to person, place, and time.  Skin: Skin is warm and dry.  Psychiatric: He has a normal mood and affect. His behavior is normal.    Ortho Exam awake alert and oriented x3.  Comfortable sitting.  Uses a cane for ambulation.  Lacks just a few degrees to full knee extension.  No instability.  Has some edematous changes that appear to be soft tissue about the right knee.  I am not sure he had much of an effusion.  No opening with varus or valgus stress.  I measured his knee flexion to be about 100 degrees.  No calf pain.  Very mild swelling of his right ankle  compared to his left.  Neurovascular exam intact.  No calf pain.  Specialty Comments:  No specialty comments available.  Imaging: No results found.   PMFS History: Patient Active Problem List   Diagnosis Date Noted  . Primary osteoarthritis of right knee 07/30/2017  . S/P TKR (total knee replacement) using cement, right 07/30/2017  . Colon cancer screening 10/28/2013  . Umbilical hernia without mention of obstruction or gangrene 10/28/2013   Past Medical History:  Diagnosis Date  . Arthritis    "right knee" (07/30/2017)  . CAP (community acquired pneumonia) 2018  . Hypertension   . Hypertension   . Wears glasses     Family History  Problem Relation Age of Onset  . Colon cancer Neg Hx     . Colon polyps Neg Hx     Past Surgical History:  Procedure Laterality Date  . EYE SURGERY    . JOINT REPLACEMENT    . RETINAL DETACHMENT SURGERY Left 2000s  . TOTAL KNEE ARTHROPLASTY Right 07/30/2017  . TOTAL KNEE ARTHROPLASTY Right 07/30/2017   Procedure: RIGHT TOTAL KNEE ARTHROPLASTY;  Surgeon: [name], MD;  Location: [facility];  Service: Orthopedics;  Laterality: Right;  Marland Kitchen VASECTOMY     Social History   Occupational History  . Not on file  Tobacco Use  . Smoking status: Never Smoker  . Smokeless tobacco: Never Used  Substance and Sexual Activity  . Alcohol use: No  . Drug use: No  . Sexual activity: Never

## 2017-11-08 ENCOUNTER — Telehealth (INDEPENDENT_AMBULATORY_CARE_PROVIDER_SITE_OTHER): Payer: Self-pay | Admitting: Orthopaedic Surgery

## 2017-11-08 NOTE — Telephone Encounter (Signed)
Left message for pt to call back with claim number so I can fax it to [place]

## 2017-11-08 NOTE — Telephone Encounter (Signed)
Patient's wife [name] called stating that [employer] needs his last office visit notes with the claim number on each page faxed to:  # [phone]  Patient will not receive his check until the information is faxed to their office.  Patient states you can call their office at [phone] if you have any questions.

## 2017-11-08 NOTE — Telephone Encounter (Signed)
Received claim number and faxed office visit with claim number on each paper

## 2017-11-11 ENCOUNTER — Ambulatory Visit (HOSPITAL_COMMUNITY): Payer: BLUE CROSS/BLUE SHIELD

## 2017-11-11 ENCOUNTER — Telehealth (INDEPENDENT_AMBULATORY_CARE_PROVIDER_SITE_OTHER): Payer: Self-pay | Admitting: Orthopaedic Surgery

## 2017-11-11 DIAGNOSIS — M25661 Stiffness of right knee, not elsewhere classified: Secondary | ICD-10-CM | POA: Diagnosis not present

## 2017-11-11 DIAGNOSIS — R262 Difficulty in walking, not elsewhere classified: Secondary | ICD-10-CM

## 2017-11-11 DIAGNOSIS — M6281 Muscle weakness (generalized): Secondary | ICD-10-CM

## 2017-11-11 DIAGNOSIS — R600 Localized edema: Secondary | ICD-10-CM

## 2017-11-11 NOTE — Telephone Encounter (Signed)
SENT FAX WITH CLAIM NUMBER ON PAPERWORK

## 2017-11-11 NOTE — Telephone Encounter (Signed)
Patient's wife [name] left a voicemail stating she was returning a call with the claim #[id number] for [name]''s short term disability.

## 2017-11-11 NOTE — Therapy (Signed)
[place], [place], [address] Phone: [phone]   Fax:  [phone]  Physical Therapy Treatment  Patient Details  Name: [name] MRN: [id number] Date of Birth: 02-17-1956 Referring Provider: Joni Fears   Encounter Date: 11/11/2017  PT End of Session - 11/11/17 1029    Visit Number  25    Number of Visits  71 mini reassess 12/02/17    Date for PT Re-Evaluation  12/23/17    Authorization Type  [employer] Other    Authorization Time Period  NEW: 11/11/17 tp 12/23/17    PT Start Time  1030    PT Stop Time  1118    PT Time Calculation (min)  48 min    Activity Tolerance  Patient tolerated treatment well;No increased pain    Behavior During Therapy  WFL for tasks assessed/performed       Past Medical History:  Diagnosis Date  . Arthritis    "right knee" (07/30/2017)  . CAP (community acquired pneumonia) 2018  . Hypertension   . Hypertension   . Wears glasses     Past Surgical History:  Procedure Laterality Date  . EYE SURGERY    . JOINT REPLACEMENT    . RETINAL DETACHMENT SURGERY Left 2000s  . TOTAL KNEE ARTHROPLASTY Right 07/30/2017  . TOTAL KNEE ARTHROPLASTY Right 07/30/2017   Procedure: RIGHT TOTAL KNEE ARTHROPLASTY;  Surgeon: [name], MD;  Location: [facility];  Service: Orthopedics;  Laterality: Right;  Marland Kitchen VASECTOMY      There were no vitals filed for this visit.  Subjective Assessment - 11/11/17 1029    Subjective  Pt reports that he is doing well, no pain. He staets that Dr. Durward Fortes told him that he wasn't really swollen in his knee but that he did have scar tissue which is what was limiting his ROM and balance. Dr. Durward Fortes wants him to continue therapy for strengthening and ROM.     Limitations  Walking    Patient Stated Goals  get back close to normal    Currently in Pain?  No/denies           [facility] PT Assessment - 11/11/17 0001      Assessment   Medical Diagnosis  R TKA     Referring Provider  Joni Fears    Onset Date/Surgical Date  07/30/17    Next MD Visit  two months, around 01/01/18      AROM   Right Knee Extension  11    Right Knee Flexion  100      Ambulation/Gait   Ambulation Distance (Feet)  100 Feet when entering/exiting gym    Assistive device  Straight cane    Gait Pattern  Step-through pattern;Decreased stance time - right;Decreased hip/knee flexion - right;Trendelenburg;Trunk flexed decr heel strike R, limp on R, decr knee flexion           [facility] Adult PT Treatment/Exercise - 11/11/17 0001      Knee/Hip Exercises: Stretches   Sports administrator  Right;3 reps;30 seconds    Quad Stretch Limitations  prone with rope      Knee/Hip Exercises: Seated   Long Arc [employer] reps    Long [employer] Limitations  5#, 3" holds      Knee/Hip Exercises: Supine   Short Arc [employer]  Right;20 reps    Short Arc Quad Sets Limitations  5#, 3" holds, basketball    Knee Extension Limitations  11    Knee Flexion Limitations  100      Knee/Hip Exercises: Prone   Hamstring Curl  20 reps    Hamstring Curl Limitations  2#    Contract/Relax to Increase Flexion  5x10" holds    Prone Knee Hang  4 minutes;Weights    Prone Knee Hang Weights (lbs)  2      Manual Therapy   Manual Therapy  Soft tissue mobilization    Manual therapy comments  completed rest of treatment    Soft tissue mobilization  instrument-assisted STM to distal quad/proximal scar to decrease adhesions          PT Education - 11/11/17 1029    Education provided  Yes    Education Details  reassessment findings, POC, HEP    Person(s) Educated  Patient    Methods  Explanation;Demonstration    Comprehension  Verbalized understanding;Returned demonstration       PT Short Term Goals - 11/11/17 1212      PT SHORT TERM GOAL #1   Title  Pt will be independent with HEP and perform consistently in order to decrease pain and maximize ROM.    Baseline  3/20: reports compliance with  HEP 2x daily    Time  3    Period  Weeks    Status  Achieved      PT SHORT TERM GOAL #2   Title  Pt will have improved R knee AROM from 5-95 deg in order to decrease pain and maximize gait.    Baseline  5/13: 11-100deg    Time  3    Period  Weeks    Status  Partially Met      PT SHORT TERM GOAL #3   Title  Pt will have decreased joint line edema by 4cm in order to decrease pain and maximize ROM.    Baseline  5/1: 41cm joint line (was 43.5cm at eval and 42.75cm on 3/20)    Time  3    Period  Weeks    Status  On-going      PT SHORT TERM GOAL #4   Title  Pt will be able to perform bil SLS for at least 10 sec with no UE in order to maximize gait and balance.    Baseline  4/20: 2 sec RLE, 3 sec LLE    Time  3    Period  Weeks    Status  On-going        PT Long Term Goals - 11/11/17 1213      PT LONG TERM GOAL #1   Title  Pt will have improved R knee AROM to 0-115deg in order to maximize gait, stair ambulation, and overall return to PLOF.    Baseline  5/30: 11-100deg    Time  6    Period  Weeks    Status  On-going      PT LONG TERM GOAL #2   Title  Pt will have improved MMT of all muscle groups tested by 1 grade or > in order to maximize gait, balance, and overall functional strength.    Baseline  5/1: see MMT    Time  3    Period  Weeks    Status  On-going      PT LONG TERM GOAL #3   Title  Pt will be able to perform 5xSTS in 12 sec or < with proper mechanics and no UE support to demo improved functional BLE strength and  balance.    Baseline  5/1: 17sec, was 18.8s    Time  3    Period  Weeks    Status  On-going      PT LONG TERM GOAL #4   Title  Pt will be able to perform the TUG with LRAD in 10 sec or < to demo improved functional strength, balance, and maximize community ambulation.    Baseline  5/1: 15 sec SPC was 15 sec RW    Time  3    Period  Weeks    Status  On-going      PT LONG TERM GOAL #5   Title  Pt will have improved 3MWT by 158f or > with LRAD and  gait WFL in order to demo improved functional strength, gait, and maximize community access.    Baseline  5/1: 4459fwith SPC, deviations continue; was 60296fW gait deviations continue    Time  3    Period  Weeks    Status  On-going            Plan - 11/11/17 1211    Clinical Impression Statement  Pt presents to therapy after f/u with Dr. WhiDurward Fortes 11/06/17; Dr. WhiDurward Fortesnts him to continue therapy for 6 more weeks for ROM and strengthening. Per pt, Dr. WhiDurward Fortesld him that he has developed scar tissue and doesn't think it is edema. PT performed functional reassessment during session but did not do formal reassessment this date as he was recently officially reassessed on 10/30/17 and has likely not made any progress towards his goals since then. Today's session focused on his ROM and strengthening. Ended with STM with tool to address scar tissue/soft tissue restrictions; noted to have increased restrictions/scar tissue along medial scar. AROM was 11 to 100 deg at EOS. PT recommends continuation of therapy for 3x/week for 6 weeks, focusing on addressing scar tissue and isolated quad and HS strengthening in order to improve ROM and overall strength; will potentially decrease frequency to 2x/week after mini reassessment, pending progress. Pt needs skilled PT intervention to address impairments in order to maximize his overall R knee mobility, strength, and return to PLOF. Did not update goals this date as PT feels they are still appropriate for pt.    Rehab Potential  Poor    PT Frequency  Other (comment) one f/u visit on 11/11/17 after check up with MD on 11/06/17    PT Treatment/Interventions  ADLs/Self Care Home Management;Cryotherapy;Electrical Stimulation;Moist Heat;Ultrasound;DME Instruction;Gait training;Stair training;Functional mobility training;Therapeutic activities;Therapeutic exercise;Balance training;Neuromuscular re-education;Patient/family education;Manual techniques;Scar  mobilization;Passive range of motion;Dry needling;Energy conservation;Taping;Biofeedback    PT Next Visit Plan  address scar tissue/soft tissue restrictions along distal quad/proximal scar; isolated quad and HS strengthening for ROM; can progress to functional strengthening as ROM improves    PT Home Exercise Plan  eval: heel slides and supine HS stretch; 3/1: seated knee flexion stretch, quad set; 3/6: standing calf stretch, SAQ; 3/18: prone quad stretch with belt; 3/29: prone knee hang    Consulted and Agree with Plan of Care  Patient       Patient will benefit from skilled therapeutic intervention in order to improve the following deficits and impairments:  Abnormal gait, Decreased activity tolerance, Decreased balance, Decreased mobility, Decreased range of motion, Decreased scar mobility, Decreased strength, Difficulty walking, Hypomobility, Increased edema, Increased fascial restricitons, Increased muscle spasms, Impaired flexibility, Improper body mechanics, Pain, Decreased endurance  Visit Diagnosis: Stiffness of right knee, not elsewhere classified  Localized edema  Difficulty  in walking, not elsewhere classified  Muscle weakness (generalized)     Problem List Patient Active Problem List   Diagnosis Date Noted  . Primary osteoarthritis of right knee 07/30/2017  . S/P TKR (total knee replacement) using cement, right 07/30/2017  . Colon cancer screening 10/28/2013  . Umbilical hernia without mention of obstruction or gangrene 10/28/2013        Geraldine Solar PT, DPT  [facility] [address] [place], [place], [address] Phone: [phone]   Fax:  [phone]  Name: [name] MRN: [id number] Date of Birth: 12/18/1955

## 2017-11-13 ENCOUNTER — Telehealth (HOSPITAL_COMMUNITY): Payer: Self-pay | Admitting: Internal Medicine

## 2017-11-13 ENCOUNTER — Encounter

## 2017-11-13 NOTE — Telephone Encounter (Signed)
11/13/17  I called to offer an appt for this morning and his wife asked him if he wanted to come in and he said he would stay like he was and come Friday.

## 2017-11-15 ENCOUNTER — Ambulatory Visit (HOSPITAL_COMMUNITY): Payer: BLUE CROSS/BLUE SHIELD

## 2017-11-15 DIAGNOSIS — R262 Difficulty in walking, not elsewhere classified: Secondary | ICD-10-CM

## 2017-11-15 DIAGNOSIS — M6281 Muscle weakness (generalized): Secondary | ICD-10-CM

## 2017-11-15 DIAGNOSIS — R600 Localized edema: Secondary | ICD-10-CM

## 2017-11-15 DIAGNOSIS — M25661 Stiffness of right knee, not elsewhere classified: Secondary | ICD-10-CM | POA: Diagnosis not present

## 2017-11-15 NOTE — Therapy (Signed)
[place], [place], [address] Phone: [phone]   Fax:  [phone]  Physical Therapy Treatment  Patient Details  Name: ANDRIY SHERK MRN: [id number] Date of Birth: Jul 28, 1955 Referring Provider: Joni Fears   Encounter Date: 11/15/2017  PT End of Session - 11/15/17 0946    Visit Number  26    Number of Visits  45 mini reassess 12/02/17    Date for PT Re-Evaluation  12/23/17    Authorization Type  [employer]BS Other    Authorization Time Period  NEW: 11/11/17 tp 12/23/17    PT Start Time  0945    PT Stop Time  1031    PT Time Calculation (min)  46 min    Activity Tolerance  Patient tolerated treatment well;No increased pain    Behavior During Therapy  WFL for tasks assessed/performed       Past Medical History:  Diagnosis Date  . Arthritis    "right knee" (07/30/2017)  . CAP (community acquired pneumonia) 2018  . Hypertension   . Hypertension   . Wears glasses     Past Surgical History:  Procedure Laterality Date  . EYE SURGERY    . JOINT REPLACEMENT    . RETINAL DETACHMENT SURGERY Left 2000s  . TOTAL KNEE ARTHROPLASTY Right 07/30/2017  . TOTAL KNEE ARTHROPLASTY Right 07/30/2017   Procedure: RIGHT TOTAL KNEE ARTHROPLASTY;  Surgeon: [name], MD;  Location: [facility];  Service: Orthopedics;  Laterality: Right;  Marland Kitchen VASECTOMY      There were no vitals filed for this visit.  Subjective Assessment - 11/15/17 0946    Subjective  Pt states that his knee is on the stiff side this morning. No pain, just stiff. He is going to his granddaughters field day today.    Limitations  Walking    Patient Stated Goals  get back close to normal    Currently in Pain?  No/denies           [facility] Adult PT Treatment/Exercise - 11/15/17 0001      Knee/Hip Exercises: Stretches   Passive Hamstring Stretch  Right;2 reps;30 seconds    Passive Hamstring Stretch Limitations  standing, 12" step    Gastroc Stretch   Both;2 reps;30 seconds    Gastroc Stretch Limitations  slant board      Knee/Hip Exercises: Aerobic   Stationary Bike  x4 mins, seat 10, 1/2 revolutions for mobility      Knee/Hip Exercises: Standing   Lateral Step Up  Right;15 reps;Hand Hold: 1;Step Height: 4"    Lateral Step Up Limitations  cues for TKE and decreased pelvic rotation      Knee/Hip Exercises: Seated   Long Arc Quad  Right;20 reps    Long Arc Quad Limitations  6#, 3" holds    Sit to [employer]  2 sets;10 reps;without UE support 2" step LLE; tactile cues to increase weight shift over RLE      Knee/Hip Exercises: Supine   Short Arc [employer]  Right;20 reps    Short Arc Quad Sets Limitations  6#, 3" holds, basketball    Knee Extension Limitations  13    Knee Flexion Limitations  100      Manual Therapy   Manual Therapy  Soft tissue mobilization    Manual therapy comments  completed rest of treatment    Soft tissue mobilization  instrument-assisted STM to distal quad/proximal scar to decrease adhesions  PT Short Term Goals - 11/11/17 1212      PT SHORT TERM GOAL #1   Title  Pt will be independent with HEP and perform consistently in order to decrease pain and maximize ROM.    Baseline  3/20: reports compliance with HEP 2x daily    Time  3    Period  Weeks    Status  Achieved      PT SHORT TERM GOAL #2   Title  Pt will have improved R knee AROM from 5-95 deg in order to decrease pain and maximize gait.    Baseline  5/13: 11-100deg    Time  3    Period  Weeks    Status  Partially Met      PT SHORT TERM GOAL #3   Title  Pt will have decreased joint line edema by 4cm in order to decrease pain and maximize ROM.    Baseline  5/1: 41cm joint line (was 43.5cm at eval and 42.75cm on 3/20)    Time  3    Period  Weeks    Status  On-going      PT SHORT TERM GOAL #4   Title  Pt will be able to perform bil SLS for at least 10 sec with no UE in order to maximize gait and balance.    Baseline  4/20: 2  sec RLE, 3 sec LLE    Time  3    Period  Weeks    Status  On-going        PT Long Term Goals - 11/11/17 1213      PT LONG TERM GOAL #1   Title  Pt will have improved R knee AROM to 0-115deg in order to maximize gait, stair ambulation, and overall return to PLOF.    Baseline  5/30: 11-100deg    Time  6    Period  Weeks    Status  On-going      PT LONG TERM GOAL #2   Title  Pt will have improved MMT of all muscle groups tested by 1 grade or > in order to maximize gait, balance, and overall functional strength.    Baseline  5/1: see MMT    Time  3    Period  Weeks    Status  On-going      PT LONG TERM GOAL #3   Title  Pt will be able to perform 5xSTS in 12 sec or < with proper mechanics and no UE support to demo improved functional BLE strength and balance.    Baseline  5/1: 17sec, was 18.8s    Time  3    Period  Weeks    Status  On-going      PT LONG TERM GOAL #4   Title  Pt will be able to perform the TUG with LRAD in 10 sec or < to demo improved functional strength, balance, and maximize community ambulation.    Baseline  5/1: 15 sec SPC was 15 sec RW    Time  3    Period  Weeks    Status  On-going      PT LONG TERM GOAL #5   Title  Pt will have improved 3MWT by 165f or > with LRAD and gait WFL in order to demo improved functional strength, gait, and maximize community access.    Baseline  5/1: 4438fwith SPC, deviations continue; was 60255fW gait deviations continue    Time  3  Period  Weeks    Status  On-going            Plan - 11/15/17 1032    Clinical Impression Statement  Continued with POC focusing on decreasing R knee adhesions and improving R knee strength. He required cues for proper exercise technique throughout and cues to decrease compensations. Resumed lateral step ups for quad strengthening. Began STS this date and pt demo'ing increased weights shift over to LLE; tactile cues to correct. He continues to demo deficits in quad strength AEB  difficulty with SAQ and LAQ Ended with manual to address soft tissue restrictions. AROM 13 to 100 deg at EOS.     Rehab Potential  Poor    PT Frequency  Other (comment) one f/u visit on 11/11/17 after check up with MD on 11/06/17    PT Treatment/Interventions  ADLs/Self Care Home Management;Cryotherapy;Electrical Stimulation;Moist Heat;Ultrasound;DME Instruction;Gait training;Stair training;Functional mobility training;Therapeutic activities;Therapeutic exercise;Balance training;Neuromuscular re-education;Patient/family education;Manual techniques;Scar mobilization;Passive range of motion;Dry needling;Energy conservation;Taping;Biofeedback    PT Next Visit Plan  address scar tissue/soft tissue restrictions along distal quad/proximal scar; isolated quad and HS strengthening for ROM; can progress to functional strengthening as ROM improves    PT Home Exercise Plan  eval: heel slides and supine HS stretch; 3/1: seated knee flexion stretch, quad set; 3/6: standing calf stretch, SAQ; 3/18: prone quad stretch with belt; 3/29: prone knee hang    Consulted and Agree with Plan of Care  Patient       Patient will benefit from skilled therapeutic intervention in order to improve the following deficits and impairments:  Abnormal gait, Decreased activity tolerance, Decreased balance, Decreased mobility, Decreased range of motion, Decreased scar mobility, Decreased strength, Difficulty walking, Hypomobility, Increased edema, Increased fascial restricitons, Increased muscle spasms, Impaired flexibility, Improper body mechanics, Pain, Decreased endurance  Visit Diagnosis: Stiffness of right knee, not elsewhere classified  Localized edema  Difficulty in walking, not elsewhere classified  Muscle weakness (generalized)     Problem List Patient Active Problem List   Diagnosis Date Noted  . Primary osteoarthritis of right knee 07/30/2017  . S/P TKR (total knee replacement) using cement, right 07/30/2017  .  Colon cancer screening 10/28/2013  . Umbilical hernia without mention of obstruction or gangrene 10/28/2013       Geraldine Solar PT, DPT  [facility] [address] [place], [place], [address] Phone: [phone]   Fax:  [phone]  Name: HARDING THOMURE MRN: [id number] Date of Birth: [birth year]

## 2017-11-20 ENCOUNTER — Ambulatory Visit (HOSPITAL_COMMUNITY): Payer: BLUE CROSS/BLUE SHIELD | Admitting: Physical Therapy

## 2017-11-20 ENCOUNTER — Encounter (HOSPITAL_COMMUNITY): Payer: Self-pay | Admitting: Physical Therapy

## 2017-11-20 DIAGNOSIS — M25661 Stiffness of right knee, not elsewhere classified: Secondary | ICD-10-CM | POA: Diagnosis not present

## 2017-11-20 DIAGNOSIS — M6281 Muscle weakness (generalized): Secondary | ICD-10-CM

## 2017-11-20 DIAGNOSIS — R262 Difficulty in walking, not elsewhere classified: Secondary | ICD-10-CM

## 2017-11-20 NOTE — Therapy (Signed)
[place], [place], [address] Phone: [phone]   Fax:  [phone]  Physical Therapy Treatment  Patient Details  Name: [name] MRN: [id number] Date of Birth: [birth year] Referring Provider: Joni Fears   Encounter Date: 11/20/2017  PT End of Session - 11/20/17 1037    Visit Number  27    Number of Visits  42 mini reassess 12/02/17    Date for PT Re-Evaluation  12/23/17    Authorization Type  BC[employer] Other    Authorization Time Period  NEW: 11/11/17 tp 12/23/17    PT Start Time  1031    PT Stop Time  1117    PT Time Calculation (min)  46 min    Equipment Utilized During Treatment  Gait belt    Activity Tolerance  Patient tolerated treatment well;No increased pain    Behavior During Therapy  WFL for tasks assessed/performed       Past Medical History:  Diagnosis Date  . Arthritis    "right knee" (07/30/2017)  . CAP (community acquired pneumonia) 2018  . Hypertension   . Hypertension   . Wears glasses     Past Surgical History:  Procedure Laterality Date  . EYE SURGERY    . JOINT REPLACEMENT    . RETINAL DETACHMENT SURGERY Left 2000s  . TOTAL KNEE ARTHROPLASTY Right 07/30/2017  . TOTAL KNEE ARTHROPLASTY Right 07/30/2017   Procedure: RIGHT TOTAL KNEE ARTHROPLASTY;  Surgeon: [name], MD;  Location: [facility];  Service: Orthopedics;  Laterality: Right;  Marland Kitchen VASECTOMY      There were no vitals filed for this visit.  Subjective Assessment - 11/20/17 1034    Subjective  Patient stated he is doing well with his exercises at home. Patient reported that he is not having any pain, but that he has some stiffness in his right knee.     Limitations  Walking    Patient Stated Goals  get back close to normal    Currently in Pain?  No/denies                       [facility] Adult PT Treatment/Exercise - 11/20/17 0001      Knee/Hip Exercises: Stretches   Passive Hamstring Stretch  Right;2  reps;30 seconds    Passive Hamstring Stretch Limitations  standing, 12" step    Gastroc Stretch  Both;2 reps;30 seconds    Gastroc Stretch Limitations  slant board      Knee/Hip Exercises: Aerobic   Stationary Bike  x4 mins, seat 10, 1/2 revolutions for mobility      Knee/Hip Exercises: Standing   Terminal Knee Extension  Right;15 reps;AROM;Strengthening    Terminal Knee Extension Limitations  15x5" holds, small pink ball against wall    Lateral Step Up  Right;15 reps;Hand Hold: 1;Step Height: 4"    Lateral Step Up Limitations  Tactile cues for decreased pelvic rotation      Knee/Hip Exercises: Seated   Long Arc Quad  Right;20 reps    Long Arc Quad Limitations  6#, 3" holds    Sit to [employer]  2 sets;10 reps;without UE support 2 in step under LLE and cues increase weight shift to Rt.       Knee/Hip Exercises: Supine   Short Arc Quad Sets  Right;20 reps    Short Arc Quad Sets Limitations  6# ankle weight, 3'' holds, knee over basketball    Knee Extension Limitations  12    Knee Flexion Limitations  100      Manual Therapy   Manual Therapy  Soft tissue mobilization    Manual therapy comments  All manual therapy completed separately from othe skilled interventions    Soft tissue mobilization  instrument-assisted STM to distal quad/proximal scar to decrease adhesions             PT Education - 11/20/17 1035    Education provided  Yes    Education Details  Purpose and technique of exercises throughout session.     Person(s) Educated  Patient    Methods  Explanation;Tactile cues;Verbal cues;Demonstration    Comprehension  Verbalized understanding;Returned demonstration;Verbal cues required;Tactile cues required       PT Short Term Goals - 11/11/17 1212      PT SHORT TERM GOAL #1   Title  Pt will be independent with HEP and perform consistently in order to decrease pain and maximize ROM.    Baseline  3/20: reports compliance with HEP 2x daily    Time  3    Period  Weeks     Status  Achieved      PT SHORT TERM GOAL #2   Title  Pt will have improved R knee AROM from 5-95 deg in order to decrease pain and maximize gait.    Baseline  5/13: 11-100deg    Time  3    Period  Weeks    Status  Partially Met      PT SHORT TERM GOAL #3   Title  Pt will have decreased joint line edema by 4cm in order to decrease pain and maximize ROM.    Baseline  5/1: 41cm joint line (was 43.5cm at eval and 42.75cm on 3/20)    Time  3    Period  Weeks    Status  On-going      PT SHORT TERM GOAL #4   Title  Pt will be able to perform bil SLS for at least 10 sec with no UE in order to maximize gait and balance.    Baseline  4/20: 2 sec RLE, 3 sec LLE    Time  3    Period  Weeks    Status  On-going        PT Long Term Goals - 11/11/17 1213      PT LONG TERM GOAL #1   Title  Pt will have improved R knee AROM to 0-115deg in order to maximize gait, stair ambulation, and overall return to PLOF.    Baseline  5/30: 11-100deg    Time  6    Period  Weeks    Status  On-going      PT LONG TERM GOAL #2   Title  Pt will have improved MMT of all muscle groups tested by 1 grade or > in order to maximize gait, balance, and overall functional strength.    Baseline  5/1: see MMT    Time  3    Period  Weeks    Status  On-going      PT LONG TERM GOAL #3   Title  Pt will be able to perform 5xSTS in 12 sec or < with proper mechanics and no UE support to demo improved functional BLE strength and balance.    Baseline  5/1: 17sec, was 18.8s    Time  3    Period  Weeks    Status  On-going      PT  LONG TERM GOAL #4   Title  Pt will be able to perform the TUG with LRAD in 10 sec or < to demo improved functional strength, balance, and maximize community ambulation.    Baseline  5/1: 15 sec SPC was 15 sec RW    Time  3    Period  Weeks    Status  On-going      PT LONG TERM GOAL #5   Title  Pt will have improved 3MWT by 158f or > with LRAD and gait WFL in order to demo improved functional  strength, gait, and maximize community access.    Baseline  5/1: 4435fwith SPC, deviations continue; was 60243fW gait deviations continue    Time  3    Period  Weeks    Status  On-going            Plan - 11/20/17 1204    Clinical Impression Statement  This session continued with established plan of care. This session continued to demonstrate left weight shift with sit to stands and therefore the 2-inch step as well as tactile cueing was performed to assist patient with performing sit to stands with proper weight shifting. Therapist continued to note increased adhesions in soft tissue and performed manual therapy in order to decrease adhesions. At end of session, patient's right knee AROM was 12 degrees of extension and 100 degrees of flexion.     Rehab Potential  Poor    PT Frequency  Other (comment) one f/u visit on 11/11/17 after check up with MD on 11/06/17    PT Treatment/Interventions  ADLs/Self Care Home Management;Cryotherapy;Electrical Stimulation;Moist Heat;Ultrasound;DME Instruction;Gait training;Stair training;Functional mobility training;Therapeutic activities;Therapeutic exercise;Balance training;Neuromuscular re-education;Patient/family education;Manual techniques;Scar mobilization;Passive range of motion;Dry needling;Energy conservation;Taping;Biofeedback    PT Next Visit Plan  address scar tissue/soft tissue restrictions along distal quad/proximal scar; isolated quad and HS strengthening for ROM; can progress to functional strengthening as ROM improves    PT Home Exercise Plan  eval: heel slides and supine HS stretch; 3/1: seated knee flexion stretch, quad set; 3/6: standing calf stretch, SAQ; 3/18: prone quad stretch with belt; 3/29: prone knee hang    Consulted and Agree with Plan of Care  Patient       Patient will benefit from skilled therapeutic intervention in order to improve the following deficits and impairments:  Abnormal gait, Decreased activity tolerance, Decreased  balance, Decreased mobility, Decreased range of motion, Decreased scar mobility, Decreased strength, Difficulty walking, Hypomobility, Increased edema, Increased fascial restricitons, Increased muscle spasms, Impaired flexibility, Improper body mechanics, Pain, Decreased endurance  Visit Diagnosis: Stiffness of right knee, not elsewhere classified  Difficulty in walking, not elsewhere classified  Muscle weakness (generalized)     Problem List Patient Active Problem List   Diagnosis Date Noted  . Primary osteoarthritis of right knee 07/30/2017  . S/P TKR (total knee replacement) using cement, right 07/30/2017  . Colon cancer screening 10/28/2013  . Umbilical hernia without mention of obstruction or gangrene 10/28/2013   MacClarene Critchley, DPT 12:09 PM, 11/20/17 [id number] [id number]: [phone]:  [phone]: HarKASTIEL SIMONIANN: [phone] of Birth: [birth year]

## 2017-11-20 NOTE — Addendum Note (Signed)
Addended by: Geralyn Corwin on: 11/20/2017 01:29 PM   Modules accepted: Orders

## 2017-11-21 ENCOUNTER — Ambulatory Visit (HOSPITAL_COMMUNITY): Payer: BLUE CROSS/BLUE SHIELD | Admitting: Physical Therapy

## 2017-11-21 DIAGNOSIS — M25661 Stiffness of right knee, not elsewhere classified: Secondary | ICD-10-CM | POA: Diagnosis not present

## 2017-11-21 DIAGNOSIS — R600 Localized edema: Secondary | ICD-10-CM

## 2017-11-21 DIAGNOSIS — M6281 Muscle weakness (generalized): Secondary | ICD-10-CM

## 2017-11-21 DIAGNOSIS — R262 Difficulty in walking, not elsewhere classified: Secondary | ICD-10-CM

## 2017-11-21 NOTE — Therapy (Signed)
[facility] [address] [place], [place], [address] Phone: [phone]   Fax:  [phone]  Physical Therapy Treatment  Patient Details  Name: [name] MRN: [id number] Date of Birth: December 21, 1955 Referring Provider: Joni Fears   Encounter Date: 11/21/2017  PT End of Session - 11/21/17 1206    Visit Number  28    Number of Visits  38 mini reassess 12/02/17    Date for PT Re-Evaluation  12/23/17    Authorization Type  BCBS Other    Authorization Time Period  NEW: 11/11/17 tp 12/23/17    PT Start Time  1117    PT Stop Time  1205    PT Time Calculation (min)  48 min    Equipment Utilized During Treatment  Gait belt    Activity Tolerance  Patient tolerated treatment well;No increased pain    Behavior During Therapy  WFL for tasks assessed/performed       Past Medical History:  Diagnosis Date  . Arthritis    "right knee" (07/30/2017)  . CAP (community acquired pneumonia) 2018  . Hypertension   . Hypertension   . Wears glasses     Past Surgical History:  Procedure Laterality Date  . EYE SURGERY    . JOINT REPLACEMENT    . RETINAL DETACHMENT SURGERY Left 2000s  . TOTAL KNEE ARTHROPLASTY Right 07/30/2017  . TOTAL KNEE ARTHROPLASTY Right 07/30/2017   Procedure: RIGHT TOTAL KNEE ARTHROPLASTY;  Surgeon: [name], MD;  Location: [facility];  Service: Orthopedics;  Laterality: Right;  Marland Kitchen VASECTOMY      There were no vitals filed for this visit.  Subjective Assessment - 11/21/17 1121    Subjective  Pt states he is doing well without pain or issues.     Currently in Pain?  No/denies                       [facility] Adult PT Treatment/Exercise - 11/21/17 0001      Knee/Hip Exercises: Stretches   Passive Hamstring Stretch  Right;2 reps;30 seconds    Passive Hamstring Stretch Limitations  standing, 12" step    Knee: Self-Stretch to increase Flexion  Right;10 seconds;Limitations    Knee: Self-Stretch  Limitations  10 reps on 12" step    Gastroc Stretch  Both;2 reps;30 seconds    Gastroc Stretch Limitations  slant board      Knee/Hip Exercises: Aerobic   Stationary Bike  x4 mins, seat 10, 1/2 revolutions for mobility      Knee/Hip Exercises: Standing   Knee Flexion  Right;15 reps;Limitations    Forward Lunges  Right;10 reps;Limitations    Forward Lunges Limitations  onto 4" step no UE's    Lateral Step Up  Right;15 reps;Hand Hold: 1;Step Height: 2"    Lateral Step Up Limitations  lower step for more control and ultilization of quad instead of hip    Forward Step Up  Right;Hand Hold: 1;Step Height: 4"      Knee/Hip Exercises: Supine   Short Arc Quad Sets  Right;20 reps    Knee Extension  AROM    Knee Extension Limitations  8    Knee Flexion  AROM    Knee Flexion Limitations  100      Manual Therapy   Manual Therapy  Myofascial release    Manual therapy comments  All manual therapy completed separately from othe skilled interventions    Myofascial Release  Rt quad and  proximal scar, surrounding adhesions             PT Education - 11/20/17 1035    Education provided  Yes    Education Details  Purpose and technique of exercises throughout session.     Person(s) Educated  Patient    Methods  Explanation;Tactile cues;Verbal cues;Demonstration    Comprehension  Verbalized understanding;Returned demonstration;Verbal cues required;Tactile cues required       PT Short Term Goals - 11/11/17 1212      PT SHORT TERM GOAL #1   Title  Pt will be independent with HEP and perform consistently in order to decrease pain and maximize ROM.    Baseline  3/20: reports compliance with HEP 2x daily    Time  3    Period  Weeks    Status  Achieved      PT SHORT TERM GOAL #2   Title  Pt will have improved R knee AROM from 5-95 deg in order to decrease pain and maximize gait.    Baseline  5/13: 11-100deg    Time  3    Period  Weeks    Status  Partially Met      PT SHORT TERM GOAL #3    Title  Pt will have decreased joint line edema by 4cm in order to decrease pain and maximize ROM.    Baseline  5/1: 41cm joint line (was 43.5cm at eval and 42.75cm on 3/20)    Time  3    Period  Weeks    Status  On-going      PT SHORT TERM GOAL #4   Title  Pt will be able to perform bil SLS for at least 10 sec with no UE in order to maximize gait and balance.    Baseline  4/20: 2 sec RLE, 3 sec LLE    Time  3    Period  Weeks    Status  On-going        PT Long Term Goals - 11/11/17 1213      PT LONG TERM GOAL #1   Title  Pt will have improved R knee AROM to 0-115deg in order to maximize gait, stair ambulation, and overall return to PLOF.    Baseline  5/30: 11-100deg    Time  6    Period  Weeks    Status  On-going      PT LONG TERM GOAL #2   Title  Pt will have improved MMT of all muscle groups tested by 1 grade or > in order to maximize gait, balance, and overall functional strength.    Baseline  5/1: see MMT    Time  3    Period  Weeks    Status  On-going      PT LONG TERM GOAL #3   Title  Pt will be able to perform 5xSTS in 12 sec or < with proper mechanics and no UE support to demo improved functional BLE strength and balance.    Baseline  5/1: 17sec, was 18.8s    Time  3    Period  Weeks    Status  On-going      PT LONG TERM GOAL #4   Title  Pt will be able to perform the TUG with LRAD in 10 sec or < to demo improved functional strength, balance, and maximize community ambulation.    Baseline  5/1: 15 sec SPC was 15 sec RW    Time  3  Period  Weeks    Status  On-going      PT LONG TERM GOAL #5   Title  Pt will have improved 3MWT by 117f or > with LRAD and gait WFL in order to demo improved functional strength, gait, and maximize community access.    Baseline  5/1: 4433fwith SPC, deviations continue; was 60250fW gait deviations continue    Time  3    Period  Weeks    Status  On-going            Plan - 11/21/17 1206    Clinical Impression  Statement  conitnued focus on ROM (both extension and flexion) and functional strengthening.  Decreased step to 2" with lateral step ups as patient was only using hip musculature and quad too weak to complete without bilateral UE assist.  IMproved form and utilization of quad with lowered step.  Myofascial techniques completed to adhesions/scar tissue with resultant decreased tightness.  AROm today 8-100 degrees, improved extension from previous 12 degrees.  Pt     Rehab Potential  Poor    PT Frequency  Other (comment) one f/u visit on 11/11/17 after check up with MD on 11/06/17    PT Treatment/Interventions  ADLs/Self Care Home Management;Cryotherapy;Electrical Stimulation;Moist Heat;Ultrasound;DME Instruction;Gait training;Stair training;Functional mobility training;Therapeutic activities;Therapeutic exercise;Balance training;Neuromuscular re-education;Patient/family education;Manual techniques;Scar mobilization;Passive range of motion;Dry needling;Energy conservation;Taping;Biofeedback    PT Next Visit Plan  address scar tissue/soft tissue restrictions along distal quad/proximal scar.  continue to improve Rt quad strength and AROM of Rt knee.    PT Home Exercise Plan  eval: heel slides and supine HS stretch; 3/1: seated knee flexion stretch, quad set; 3/6: standing calf stretch, SAQ; 3/18: prone quad stretch with belt; 3/29: prone knee hang    Consulted and Agree with Plan of Care  Patient       Patient will benefit from skilled therapeutic intervention in order to improve the following deficits and impairments:  Abnormal gait, Decreased activity tolerance, Decreased balance, Decreased mobility, Decreased range of motion, Decreased scar mobility, Decreased strength, Difficulty walking, Hypomobility, Increased edema, Increased fascial restricitons, Increased muscle spasms, Impaired flexibility, Improper body mechanics, Pain, Decreased endurance  Visit Diagnosis: Stiffness of right knee, not elsewhere  classified  Difficulty in walking, not elsewhere classified  Muscle weakness (generalized)  Localized edema     Problem List Patient Active Problem List   Diagnosis Date Noted  . Primary osteoarthritis of right knee 07/30/2017  . S/P TKR (total knee replacement) using cement, right 07/30/2017  . Colon cancer screening 10/28/2013  . Umbilical hernia without mention of obstruction or gangrene 10/28/2013   AmyTeena IraniTA/CLT [id number] Irani23/2019, 12:11 PM  [id number]: [phone]:  [phone]: [name]: [id number] of Birth: [birth year]

## 2017-11-22 ENCOUNTER — Encounter

## 2017-11-27 ENCOUNTER — Ambulatory Visit (HOSPITAL_COMMUNITY): Payer: BLUE CROSS/BLUE SHIELD | Admitting: Physical Therapy

## 2017-11-27 DIAGNOSIS — M25661 Stiffness of right knee, not elsewhere classified: Secondary | ICD-10-CM | POA: Diagnosis not present

## 2017-11-27 DIAGNOSIS — R262 Difficulty in walking, not elsewhere classified: Secondary | ICD-10-CM

## 2017-11-27 DIAGNOSIS — M6281 Muscle weakness (generalized): Secondary | ICD-10-CM

## 2017-11-27 NOTE — Therapy (Signed)
[place], [place], [address] Phone: [phone]   Fax:  [phone]  Physical Therapy Treatment  Patient Details  Name: [name] MRN: [id number] Date of Birth: 1955-10-07 Referring Provider: Joni Fears   Encounter Date: 11/27/2017  PT End of Session - 11/27/17 1025    Visit Number  29    Number of Visits  63 mini reassess 12/02/17    Date for PT Re-Evaluation  12/23/17    Authorization Type  [employer] Other    Authorization Time Period  NEW: 11/11/17 tp 12/23/17    PT Start Time  0957    PT Stop Time  1035    PT Time Calculation (min)  38 min    Equipment Utilized During Treatment  Gait belt    Activity Tolerance  Patient tolerated treatment well;No increased pain    Behavior During Therapy  WFL for tasks assessed/performed       Past Medical History:  Diagnosis Date  . Arthritis    "right knee" (07/30/2017)  . CAP (community acquired pneumonia) 2018  . Hypertension   . Hypertension   . Wears glasses     Past Surgical History:  Procedure Laterality Date  . EYE SURGERY    . JOINT REPLACEMENT    . RETINAL DETACHMENT SURGERY Left 2000s  . TOTAL KNEE ARTHROPLASTY Right 07/30/2017  . TOTAL KNEE ARTHROPLASTY Right 07/30/2017   Procedure: RIGHT TOTAL KNEE ARTHROPLASTY;  Surgeon: [name], MD;  Location: [facility];  Service: Orthopedics;  Laterality: Right;  Marland Kitchen VASECTOMY      There were no vitals filed for this visit.  Subjective Assessment - 11/27/17 1210    Subjective  PT running late for appt today.  STates he is doing well without pain.    Currently in Pain?  No/denies                       [facility] Adult PT Treatment/Exercise - 11/27/17 0001      Knee/Hip Exercises: Stretches   Passive Hamstring Stretch  Right;2 reps;30 seconds    Passive Hamstring Stretch Limitations  standing, 12" step    Knee: Self-Stretch to increase Flexion  Right;10 seconds;Limitations    Knee:  Self-Stretch Limitations  10 reps on 12" step    Gastroc Stretch  Both;2 reps;30 seconds    Gastroc Stretch Limitations  slant board      Knee/Hip Exercises: Aerobic   Stationary Bike  x4 mins, seat 10, 1/2 revolutions for mobility      Knee/Hip Exercises: Standing   Knee Flexion  Right;15 reps;Limitations    Forward Lunges  Right;Limitations;15 reps    Forward Lunges Limitations  onto 4" step no UE's    Lateral Step Up  Right;15 reps;Hand Hold: 1;Step Height: 2"    Lateral Step Up Limitations  lower step for more control and ultilization of quad instead of hip    Forward Step Up  Right;Hand Hold: 1;Step Height: 4"               PT Short Term Goals - 11/11/17 1212      PT SHORT TERM GOAL #1   Title  Pt will be independent with HEP and perform consistently in order to decrease pain and maximize ROM.    Baseline  3/20: reports compliance with HEP 2x daily    Time  3    Period  Weeks    Status  Achieved  PT SHORT TERM GOAL #2   Title  Pt will have improved R knee AROM from 5-95 deg in order to decrease pain and maximize gait.    Baseline  5/13: 11-100deg    Time  3    Period  Weeks    Status  Partially Met      PT SHORT TERM GOAL #3   Title  Pt will have decreased joint line edema by 4cm in order to decrease pain and maximize ROM.    Baseline  5/1: 41cm joint line (was 43.5cm at eval and 42.75cm on 3/20)    Time  3    Period  Weeks    Status  On-going      PT SHORT TERM GOAL #4   Title  Pt will be able to perform bil SLS for at least 10 sec with no UE in order to maximize gait and balance.    Baseline  4/20: 2 sec RLE, 3 sec LLE    Time  3    Period  Weeks    Status  On-going        PT Long Term Goals - 11/11/17 1213      PT LONG TERM GOAL #1   Title  Pt will have improved R knee AROM to 0-115deg in order to maximize gait, stair ambulation, and overall return to PLOF.    Baseline  5/30: 11-100deg    Time  6    Period  Weeks    Status  On-going       PT LONG TERM GOAL #2   Title  Pt will have improved MMT of all muscle groups tested by 1 grade or > in order to maximize gait, balance, and overall functional strength.    Baseline  5/1: see MMT    Time  3    Period  Weeks    Status  On-going      PT LONG TERM GOAL #3   Title  Pt will be able to perform 5xSTS in 12 sec or < with proper mechanics and no UE support to demo improved functional BLE strength and balance.    Baseline  5/1: 17sec, was 18.8s    Time  3    Period  Weeks    Status  On-going      PT LONG TERM GOAL #4   Title  Pt will be able to perform the TUG with LRAD in 10 sec or < to demo improved functional strength, balance, and maximize community ambulation.    Baseline  5/1: 15 sec SPC was 15 sec RW    Time  3    Period  Weeks    Status  On-going      PT LONG TERM GOAL #5   Title  Pt will have improved 3MWT by 169f or > with LRAD and gait WFL in order to demo improved functional strength, gait, and maximize community access.    Baseline  5/1: 4418fwith SPC, deviations continue; was 60247fW gait deviations continue    Time  3    Period  Weeks    Status  On-going            Plan - 11/27/17 1025    Clinical Impression Statement  Pt was late for appt today, unable to complete all exercises. PT continues to require postural cues as tends to flex forward and compensate for weakness with body movements.  Completed MFR to knee to help improve mobiltiy with  resultant ROM achieved of 8-100 degrees today, unchanged from last session .     Rehab Potential  Poor    PT Frequency  Other (comment) one f/u visit on 11/11/17 after check up with MD on 11/06/17    PT Treatment/Interventions  ADLs/Self Care Home Management;Cryotherapy;Electrical Stimulation;Moist Heat;Ultrasound;DME Instruction;Gait training;Stair training;Functional mobility training;Therapeutic activities;Therapeutic exercise;Balance training;Neuromuscular re-education;Patient/family education;Manual techniques;Scar  mobilization;Passive range of motion;Dry needling;Energy conservation;Taping;Biofeedback    PT Next Visit Plan  address scar tissue/soft tissue restrictions along distal quad/proximal scar.  continue to improve Rt quad strength and AROM of Rt knee.    PT Home Exercise Plan  eval: heel slides and supine HS stretch; 3/1: seated knee flexion stretch, quad set; 3/6: standing calf stretch, SAQ; 3/18: prone quad stretch with belt; 3/29: prone knee hang    Consulted and Agree with Plan of Care  Patient       Patient will benefit from skilled therapeutic intervention in order to improve the following deficits and impairments:  Abnormal gait, Decreased activity tolerance, Decreased balance, Decreased mobility, Decreased range of motion, Decreased scar mobility, Decreased strength, Difficulty walking, Hypomobility, Increased edema, Increased fascial restricitons, Increased muscle spasms, Impaired flexibility, Improper body mechanics, Pain, Decreased endurance  Visit Diagnosis: Stiffness of right knee, not elsewhere classified  Difficulty in walking, not elsewhere classified  Muscle weakness (generalized)     Problem List Patient Active Problem List   Diagnosis Date Noted  . Primary osteoarthritis of right knee 07/30/2017  . S/P TKR (total knee replacement) using cement, right 07/30/2017  . Colon cancer screening 10/28/2013  . Umbilical hernia without mention of obstruction or gangrene 10/28/2013   Teena Irani, PTA/CLT [phone]  Teena Irani 11/27/2017, 12:10 PM  [place] [address] [place], [place], [address] Phone: [phone]   Fax:  [phone]  Name: EMMIT ORILEY MRN: [id number] Date of Birth: 1955-10-08

## 2017-12-02 ENCOUNTER — Ambulatory Visit (HOSPITAL_COMMUNITY): Payer: BLUE CROSS/BLUE SHIELD | Attending: Internal Medicine

## 2017-12-02 ENCOUNTER — Telehealth (HOSPITAL_COMMUNITY): Payer: Self-pay

## 2017-12-02 DIAGNOSIS — R262 Difficulty in walking, not elsewhere classified: Secondary | ICD-10-CM | POA: Insufficient documentation

## 2017-12-02 DIAGNOSIS — R600 Localized edema: Secondary | ICD-10-CM | POA: Insufficient documentation

## 2017-12-02 DIAGNOSIS — M25661 Stiffness of right knee, not elsewhere classified: Secondary | ICD-10-CM | POA: Insufficient documentation

## 2017-12-02 DIAGNOSIS — M6281 Muscle weakness (generalized): Secondary | ICD-10-CM | POA: Insufficient documentation

## 2017-12-02 NOTE — Telephone Encounter (Signed)
No show #1; pt stating he did not have this appointment on his schedule. He states he will be here Wednesday for his next scheduled appointment.   Geraldine Solar PT, DPT

## 2017-12-04 ENCOUNTER — Encounter (HOSPITAL_COMMUNITY): Payer: Self-pay

## 2017-12-04 ENCOUNTER — Telehealth (HOSPITAL_COMMUNITY): Payer: Self-pay

## 2017-12-04 ENCOUNTER — Ambulatory Visit (HOSPITAL_COMMUNITY): Payer: BLUE CROSS/BLUE SHIELD

## 2017-12-04 DIAGNOSIS — M6281 Muscle weakness (generalized): Secondary | ICD-10-CM

## 2017-12-04 DIAGNOSIS — R600 Localized edema: Secondary | ICD-10-CM

## 2017-12-04 DIAGNOSIS — M25661 Stiffness of right knee, not elsewhere classified: Secondary | ICD-10-CM

## 2017-12-04 DIAGNOSIS — R262 Difficulty in walking, not elsewhere classified: Secondary | ICD-10-CM

## 2017-12-04 NOTE — Telephone Encounter (Signed)
PT switched patient to once a week

## 2017-12-04 NOTE — Therapy (Signed)
[place], [place], [address] Phone: [phone]   Fax:  [phone]   Progress Note Reporting Period 11/11/17 to 12/04/17  See note below for Objective Data and Assessment of Progress/Goals.    Physical Therapy Treatment  Patient Details  Name: OSLO HUNTSMAN MRN: [id number] Date of Birth: 18-Jan-1956 Referring Provider: Joni Fears, MD   Encounter Date: 12/04/2017  PT End of Session - 12/04/17 0944    Visit Number  30    Number of Visits  28    Date for PT Re-Evaluation  01/15/18    Authorization Type  [employer] Other    Authorization Time Period  NEW: 11/11/17 tp 12/23/17; NEW: 12/04/17 to 01/15/18 (1x/week for 6 weeks, reassess at end of POC)    PT Start Time  0944    PT Stop Time  1027    PT Time Calculation (min)  43 min    Equipment Utilized During Treatment  --    Activity Tolerance  Patient tolerated treatment well;No increased pain    Behavior During Therapy  WFL for tasks assessed/performed       Past Medical History:  Diagnosis Date  . Arthritis    "right knee" (07/30/2017)  . CAP (community acquired pneumonia) 2018  . Hypertension   . Hypertension   . Wears glasses     Past Surgical History:  Procedure Laterality Date  . EYE SURGERY    . JOINT REPLACEMENT    . RETINAL DETACHMENT SURGERY Left 2000s  . TOTAL KNEE ARTHROPLASTY Right 07/30/2017  . TOTAL KNEE ARTHROPLASTY Right 07/30/2017   Procedure: RIGHT TOTAL KNEE ARTHROPLASTY;  Surgeon: [name], MD;  Location: [facility];  Service: Orthopedics;  Laterality: Right;  Marland Kitchen VASECTOMY      There were no vitals filed for this visit.  Subjective Assessment - 12/04/17 0944    Subjective  Pt reports he has some stiffness but no pain.     Currently in Pain?  No/denies         [facility] PT Assessment - 12/04/17 0001      Assessment   Medical Diagnosis  R TKA    Referring Provider  Joni Fears, MD    Onset Date/Surgical Date  07/30/17    Next MD Visit  two months, around 01/01/18      Circumferential Edema   Circumferential - Right  41cm joint line was 41cm      ROM / Strength   AROM / PROM / Strength  PROM      AROM   Right Knee Extension  12 was 11    Right Knee Flexion  98 was 100      PROM   Right/Left Knee  Right    Right Knee Extension  8    Right Knee Flexion  100      Strength   Right Hip Extension  4+/5 was 4    Right Hip ABduction  4/5 was 4-    Left Hip Extension  4/5 was 4    Left Hip ABduction  4+/5 was 4+    Right Knee Flexion  4+/5 was 4-    Right Knee Extension  5/5      Ambulation/Gait   Ambulation Distance (Feet)  530 Feet was 431f with SPC    Assistive device  Straight cane    Gait Pattern  Step-through pattern;Decreased stance time - right;Decreased hip/knee flexion - right;Trendelenburg;Trunk flexed limp on R, decr knee flexion  Balance   Balance Assessed  Yes      Static Standing Balance   Static Standing - Balance Support  No upper extremity supported    Static Standing Balance -  Activities   Single Leg Stance - Right Leg;Single Leg Stance - Left Leg    Static Standing - Comment/# of Minutes  R: < 2 sec L: <2 sec was 2 sec R, 3 sec L      Standardized Balance Assessment   Standardized Balance Assessment  Five Times Sit to Stand;Timed Up and Go Test    Five times sit to stand comments   17sec, no UE, RLE extended was 17 sec, no UE, RLE extended      Timed Up and Go Test   TUG  Normal TUG    Normal TUG (seconds)  13.5 no AD, gait deviations    TUG Comments  was 15sec with SPC             [facility] Adult PT Treatment/Exercise - 12/04/17 0001      Knee/Hip Exercises: Aerobic   Nustep  8mns, L3, AA/ROM knee flexion, No UE, seat on 7            PT Education - 12/04/17 0944    Education provided  Yes    Education Details  reassessment findings, decrease PT frequency to 1x/week, may seek referral for JAS brace    Person(s) Educated  Patient    Methods  Explanation     Comprehension  Verbalized understanding       PT Short Term Goals - 12/04/17 0947      PT SHORT TERM GOAL #1   Title  Pt will be independent with HEP and perform consistently in order to decrease pain and maximize ROM.    Baseline  3/20: reports compliance with HEP 2x daily    Time  3    Period  Weeks    Status  Achieved      PT SHORT TERM GOAL #2   Title  Pt will have improved R knee AROM from 5-95 deg in order to decrease pain and maximize gait.    Baseline  6/5: 12-98 AROM, 8-100PROM    Time  3    Period  Weeks    Status  Partially Met      PT SHORT TERM GOAL #3   Title  Pt will have decreased joint line edema by 4cm in order to decrease pain and maximize ROM.    Baseline  6/5: 41cm joint line (no change from last reassessment)    Time  3    Period  Weeks    Status  On-going      PT SHORT TERM GOAL #4   Title  Pt will be able to perform bil SLS for at least 10 sec with no UE in order to maximize gait and balance.    Baseline  6/5: <2 sec BLE this date    Time  3    Period  Weeks    Status  On-going        PT Long Term Goals - 12/04/17 0948      PT LONG TERM GOAL #1   Title  Pt will have improved R knee AROM to 0-115deg in order to maximize gait, stair ambulation, and overall return to PLOF.    Baseline  6/5: 12-98AROM, 8-100PROM    Time  6    Period  Weeks    Status  On-going  PT LONG TERM GOAL #2   Title  Pt will have improved MMT of all muscle groups tested by 1 grade or > in order to maximize gait, balance, and overall functional strength.    Baseline  6/5: see MMT    Time  3    Period  Weeks    Status  Partially Met      PT LONG TERM GOAL #3   Title  Pt will be able to perform 5xSTS in 12 sec or < with proper mechanics and no UE support to demo improved functional BLE strength and balance.    Baseline  6/5: 17sec    Time  3    Period  Weeks    Status  On-going      PT LONG TERM GOAL #4   Title  Pt will be able to perform the TUG with LRAD in  10 sec or < to demo improved functional strength, balance, and maximize community ambulation.    Baseline  6/5: 13 sec no AD, was 15 sec SPC    Time  3    Period  Weeks    Status  On-going      PT LONG TERM GOAL #5   Title  Pt will have improved 3MWT by 137f or > with LRAD and gait WFL in order to demo improved functional strength, gait, and maximize community access.    Baseline  6/5: 5320fwith SPC, deviations continue; was 60217fW gait deviations continue    Time  3    Period  Weeks    Status  On-going            Plan - 12/04/17 1241    Clinical Impression Statement  PT reassessed pt's goals and outcome measures this date. Pt has made minimal progress towards goals, though his MMT did slightly improve. His functional outcome measures have remained relatively unchanged for the last few reassessments and his AROM has maintained around 12-98deg with his PROM 8-100deg today. PT recommending decreasing pt's frequency to 1x/week for the next 6 weeks until he sees his MD in July. During this time, a heavy emphasis on strengthening, updating his HEP, while addressing his soft tissue adhesions will be the focus. PT will also seek getting a referral for a JAS brace for the pt to continue to address his ROM deficits.  Pt agreeable to this plan.     Rehab Potential  Poor    PT Frequency  1x / week one f/u visit on 11/11/17 after check up with MD on 11/06/17    PT Duration  6 weeks    PT Treatment/Interventions  ADLs/Self Care Home Management;Cryotherapy;Electrical Stimulation;Moist Heat;Ultrasound;DME Instruction;Gait training;Stair training;Functional mobility training;Therapeutic activities;Therapeutic exercise;Balance training;Neuromuscular re-education;Patient/family education;Manual techniques;Scar mobilization;Passive range of motion;Dry needling;Energy conservation;Taping;Biofeedback    PT Next Visit Plan  heavy emphasis on strengthening and updating his HEP to include such; continue MFR/STM  to address adhesions; f/u on JAS brace referral    PT Home Exercise Plan  eval: heel slides and supine HS stretch; 3/1: seated knee flexion stretch, quad set; 3/6: standing calf stretch, SAQ; 3/18: prone quad stretch with belt; 3/29: prone knee hang    Consulted and Agree with Plan of Care  Patient       Patient will benefit from skilled therapeutic intervention in order to improve the following deficits and impairments:  Abnormal gait, Decreased activity tolerance, Decreased balance, Decreased mobility, Decreased range of motion, Decreased scar mobility, Decreased strength, Difficulty walking, Hypomobility, Increased  edema, Increased fascial restricitons, Increased muscle spasms, Impaired flexibility, Improper body mechanics, Pain, Decreased endurance  Visit Diagnosis: Stiffness of right knee, not elsewhere classified - Plan: PT plan of care cert/re-cert  Difficulty in walking, not elsewhere classified - Plan: PT plan of care cert/re-cert  Muscle weakness (generalized) - Plan: PT plan of care cert/re-cert  Localized edema - Plan: PT plan of care cert/re-cert     Problem List Patient Active Problem List   Diagnosis Date Noted  . Primary osteoarthritis of right knee 07/30/2017  . S/P TKR (total knee replacement) using cement, right 07/30/2017  . Colon cancer screening 10/28/2013  . Umbilical hernia without mention of obstruction or gangrene 10/28/2013        Geraldine Solar PT, DPT  [place] [address] [place], [place], [address] Phone: [phone]   Fax:  [phone]  Name: ALEXSANDER CAVINS MRN: [id number] Date of Birth: [birth year]

## 2017-12-04 NOTE — Telephone Encounter (Signed)
Patient need to take his wife to the MD

## 2017-12-04 NOTE — Telephone Encounter (Signed)
PT swifted him to once a week

## 2017-12-06 ENCOUNTER — Encounter (HOSPITAL_COMMUNITY): Payer: BLUE CROSS/BLUE SHIELD

## 2017-12-09 ENCOUNTER — Encounter (HOSPITAL_COMMUNITY): Payer: BLUE CROSS/BLUE SHIELD

## 2017-12-11 ENCOUNTER — Ambulatory Visit (HOSPITAL_COMMUNITY): Payer: BLUE CROSS/BLUE SHIELD

## 2017-12-11 DIAGNOSIS — M25661 Stiffness of right knee, not elsewhere classified: Secondary | ICD-10-CM

## 2017-12-11 DIAGNOSIS — M6281 Muscle weakness (generalized): Secondary | ICD-10-CM

## 2017-12-11 DIAGNOSIS — R600 Localized edema: Secondary | ICD-10-CM

## 2017-12-11 DIAGNOSIS — R262 Difficulty in walking, not elsewhere classified: Secondary | ICD-10-CM

## 2017-12-11 NOTE — Therapy (Signed)
[place], [place], [address] Phone: [phone]   Fax:  [phone]  Physical Therapy Treatment  Patient Details  Name: [name] MRN: [id number] Date of Birth: [birth year] Referring Provider: Joni Fears, MD   Encounter Date: 12/11/2017  PT End of Session - 12/11/17 0954    Visit Number  31    Number of Visits  15    Date for PT Re-Evaluation  01/15/18    Authorization Type  BCBS Other    Authorization Time Period  NEW: 11/11/17 tp 12/23/17; NEW: 12/04/17 to 01/15/18 (1x/week for 6 weeks, reassess at end of POC)    PT Start Time  0945    PT Stop Time  1029    PT Time Calculation (min)  44 min    Activity Tolerance  Patient tolerated treatment well;No increased pain    Behavior During Therapy  WFL for tasks assessed/performed       Past Medical History:  Diagnosis Date  . Arthritis    "right knee" (07/30/2017)  . CAP (community acquired pneumonia) 2018  . Hypertension   . Hypertension   . Wears glasses     Past Surgical History:  Procedure Laterality Date  . EYE SURGERY    . JOINT REPLACEMENT    . RETINAL DETACHMENT SURGERY Left 2000s  . TOTAL KNEE ARTHROPLASTY Right 07/30/2017  . TOTAL KNEE ARTHROPLASTY Right 07/30/2017   Procedure: RIGHT TOTAL KNEE ARTHROPLASTY;  Surgeon: [name], MD;  Location: [facility];  Service: Orthopedics;  Laterality: Right;  Marland Kitchen VASECTOMY      There were no vitals filed for this visit.  Subjective Assessment - 12/11/17 0955    Subjective  Pt reports he has been feeling great since last week. No pain still. He's trying to keep it moving.    Currently in Pain?  No/denies              [facility] Adult PT Treatment/Exercise - 12/11/17 0001      Knee/Hip Exercises: Aerobic   Stationary Bike  x4 mins seat 12, 1/2 and retro revolutions for mobility      Knee/Hip Exercises: Machines for Strengthening   Cybex Knee Extension  RLE only, 2 plates, 3x10 reps    Cybex Knee Flexion  RLE only, 3 plates, 2x10 reps    Total Gym Leg Press  25deg, BLE x10 reps; 21deg RLE only 2x10 reps       Knee/Hip Exercises: Standing   Knee Flexion  Right;15 reps    Knee Flexion Limitations  3#    Wall Squat  2 sets;10 reps;Limitations    Wall Squat Limitations  cues for proper weight shift over RLE      Knee/Hip Exercises: Supine   Knee Extension Limitations  10    Knee Flexion Limitations  98            PT Education - 12/11/17 0954    Education provided  Yes    Education Details  info provided on JAS brace and will continue to f/u with Dr. Durward Fortes regarding signed referral. updated HEP    Person(s) Educated  Patient    Methods  Explanation;Demonstration;Handout    Comprehension  Verbalized understanding;Returned demonstration       PT Short Term Goals - 12/04/17 0947      PT SHORT TERM GOAL #1   Title  Pt will be independent with HEP and perform consistently in order to decrease pain and maximize ROM.  Baseline  3/20: reports compliance with HEP 2x daily    Time  3    Period  Weeks    Status  Achieved      PT SHORT TERM GOAL #2   Title  Pt will have improved R knee AROM from 5-95 deg in order to decrease pain and maximize gait.    Baseline  6/5: 12-98 AROM, 8-100PROM    Time  3    Period  Weeks    Status  Partially Met      PT SHORT TERM GOAL #3   Title  Pt will have decreased joint line edema by 4cm in order to decrease pain and maximize ROM.    Baseline  6/5: 41cm joint line (no change from last reassessment)    Time  3    Period  Weeks    Status  On-going      PT SHORT TERM GOAL #4   Title  Pt will be able to perform bil SLS for at least 10 sec with no UE in order to maximize gait and balance.    Baseline  6/5: <2 sec BLE this date    Time  3    Period  Weeks    Status  On-going        PT Long Term Goals - 12/04/17 0948      PT LONG TERM GOAL #1   Title  Pt will have improved R knee AROM to 0-115deg in order to maximize  gait, stair ambulation, and overall return to PLOF.    Baseline  6/5: 12-98AROM, 8-100PROM    Time  6    Period  Weeks    Status  On-going      PT LONG TERM GOAL #2   Title  Pt will have improved MMT of all muscle groups tested by 1 grade or > in order to maximize gait, balance, and overall functional strength.    Baseline  6/5: see MMT    Time  3    Period  Weeks    Status  Partially Met      PT LONG TERM GOAL #3   Title  Pt will be able to perform 5xSTS in 12 sec or < with proper mechanics and no UE support to demo improved functional BLE strength and balance.    Baseline  6/5: 17sec    Time  3    Period  Weeks    Status  On-going      PT LONG TERM GOAL #4   Title  Pt will be able to perform the TUG with LRAD in 10 sec or < to demo improved functional strength, balance, and maximize community ambulation.    Baseline  6/5: 13 sec no AD, was 15 sec SPC    Time  3    Period  Weeks    Status  On-going      PT LONG TERM GOAL #5   Title  Pt will have improved 3MWT by 135f or > with LRAD and gait WFL in order to demo improved functional strength, gait, and maximize community access.    Baseline  6/5: 5335fwith SPC, deviations continue; was 60243fW gait deviations continue    Time  3    Period  Weeks    Status  On-going            Plan - 12/11/17 1054    Clinical Impression Statement  Began with new POC emphasizing increased strengthening this  date. Initiated cybex extension and flexion machines as well as power tower leg press for quad, hamstring, and posterior chain strengthening. Pt challenged with single-leg leg press on power tower. Updated pt's strengthening HEP this date as well, per POC. Measured pt for knee JAS brace this date; PT is just awaiting signed physician's referral at this point. Pt educated on JAS brace, provided handouts with info on the brace, and stated he would talk to his MD about it. AROM 10-98deg this date. Unable to perform MFR this date due to time  constraints; will address as needed/able in future sessions. Continued as planned, progressing as able.     Rehab Potential  Poor    PT Frequency  1x / week one f/u visit on 11/11/17 after check up with MD on 11/06/17    PT Duration  6 weeks    PT Treatment/Interventions  ADLs/Self Care Home Management;Cryotherapy;Electrical Stimulation;Moist Heat;Ultrasound;DME Instruction;Gait training;Stair training;Functional mobility training;Therapeutic activities;Therapeutic exercise;Balance training;Neuromuscular re-education;Patient/family education;Manual techniques;Scar mobilization;Passive range of motion;Dry needling;Energy conservation;Taping;Biofeedback    PT Next Visit Plan  continue heavy emphasis on strengthening and updating his HEP to include such; continue cybex and leg press; add hip abd and sidestepping with band; continue MFR/STM to address adhesions; f/u on JAS brace referral    PT Home Exercise Plan  eval: heel slides and supine HS stretch; 3/1: seated knee flexion stretch, quad set; 3/6: standing calf stretch, SAQ; 3/18: prone quad stretch with belt; 3/29: prone knee hang; 6/12: wall squats, standing knee flexion    Recommended Other Services  f/u on JAS brace referral    Consulted and Agree with Plan of Care  Patient       Patient will benefit from skilled therapeutic intervention in order to improve the following deficits and impairments:  Abnormal gait, Decreased activity tolerance, Decreased balance, Decreased mobility, Decreased range of motion, Decreased scar mobility, Decreased strength, Difficulty walking, Hypomobility, Increased edema, Increased fascial restricitons, Increased muscle spasms, Impaired flexibility, Improper body mechanics, Pain, Decreased endurance  Visit Diagnosis: Stiffness of right knee, not elsewhere classified  Difficulty in walking, not elsewhere classified  Muscle weakness (generalized)  Localized edema     Problem List Patient Active Problem List    Diagnosis Date Noted  . Primary osteoarthritis of right knee 07/30/2017  . S/P TKR (total knee replacement) using cement, right 07/30/2017  . Colon cancer screening 10/28/2013  . Umbilical hernia without mention of obstruction or gangrene 10/28/2013       Geraldine Solar PT, DPT  [facility] [address] [place], [place], [address] Phone: [phone]   Fax:  [phone]  Name: [name] MRN: [id number] Date of Birth: 07-21-1955

## 2017-12-13 ENCOUNTER — Encounter (HOSPITAL_COMMUNITY): Payer: BLUE CROSS/BLUE SHIELD

## 2017-12-16 ENCOUNTER — Encounter (HOSPITAL_COMMUNITY): Payer: BLUE CROSS/BLUE SHIELD | Admitting: Physical Therapy

## 2017-12-18 ENCOUNTER — Ambulatory Visit (HOSPITAL_COMMUNITY): Payer: BLUE CROSS/BLUE SHIELD | Admitting: Physical Therapy

## 2017-12-18 DIAGNOSIS — R600 Localized edema: Secondary | ICD-10-CM

## 2017-12-18 DIAGNOSIS — M25661 Stiffness of right knee, not elsewhere classified: Secondary | ICD-10-CM

## 2017-12-18 DIAGNOSIS — M6281 Muscle weakness (generalized): Secondary | ICD-10-CM

## 2017-12-18 DIAGNOSIS — R262 Difficulty in walking, not elsewhere classified: Secondary | ICD-10-CM

## 2017-12-18 NOTE — Patient Instructions (Addendum)
KNEE: Extension, Long Arc Quads - Sitting    Raise leg until knee is straight. __15_ reps per set, _2__ sets per day, __7_ days per week   Strengthening: Wall Slide    Leaning on wall, slowly lower buttocks until thighs are parallel to floor. Hold _5___ seconds. Tighten thigh muscles and return. Repeat _10___ times per set. Do __2__ sets per session. Do _2___ sessions per day.     Single Leg Balance: Eyes Open    Stand on right leg with eyes open. Hold _30__ seconds. _3__ reps _2__ times per day.

## 2017-12-18 NOTE — Therapy (Signed)
[place], [place], [address] Phone: [phone]   Fax:  [phone]  Physical Therapy Treatment  Patient Details  Name: [name] MRN: [id number] Date of Birth: January 02, 1956 Referring Provider: Joni Fears, MD   Encounter Date: 12/18/2017  PT End of Session - 12/18/17 1056    Visit Number  32    Number of Visits  80    Date for PT Re-Evaluation  01/15/18    Authorization Type  BCBS Other    Authorization Time Period  NEW: 11/11/17 tp 12/23/17; NEW: 12/04/17 to 01/15/18 (1x/week for 6 weeks, reassess at end of [facility])    PT Start Time  0953 pt checked in late    PT Stop Time  1038    PT Time Calculation (min)  45 min    Activity Tolerance  Patient tolerated treatment well;No increased pain    Behavior During Therapy  WFL for tasks assessed/performed       Past Medical History:  Diagnosis Date  . Arthritis    "right knee" (07/30/2017)  . CAP (community acquired pneumonia) 2018  . Hypertension   . Hypertension   . Wears glasses     Past Surgical History:  Procedure Laterality Date  . EYE SURGERY    . JOINT REPLACEMENT    . RETINAL DETACHMENT SURGERY Left 2000s  . TOTAL KNEE ARTHROPLASTY Right 07/30/2017  . TOTAL KNEE ARTHROPLASTY Right 07/30/2017   Procedure: RIGHT TOTAL KNEE ARTHROPLASTY;  Surgeon: [name], MD;  Location: [facility];  Service: Orthopedics;  Laterality: Right;  Marland Kitchen VASECTOMY      There were no vitals filed for this visit.  Subjective Assessment - 12/18/17 1020    Subjective  Pt reports no pain today.  States returns to Dr. Arrie Aran in July.  Order has been faxed for JAS brace (per [facility]) but have not received signed order yet.     Currently in Pain?  No/denies                       [facility] Adult PT Treatment/Exercise - 12/18/17 0001      Knee/Hip Exercises: Stretches   Knee: Self-Stretch to increase Flexion  Right;10 seconds;Limitations    Knee: Self-Stretch  Limitations  10 reps on 12" step      Knee/Hip Exercises: Aerobic   Stationary Bike  x4 mins seat 12, 1/2 and retro revolutions for mobility      Knee/Hip Exercises: Machines for Strengthening   Cybex Knee Extension  RLE only, 2 plates, 3x10 reps    Cybex Knee Flexion  RLE only, 3 plates, 2x10 reps    Total Gym Leg Press  25deg, BLE x10 reps; 21deg RLE only 2x10 reps       Knee/Hip Exercises: Standing   Knee Flexion  Right;15 reps    Wall Squat  2 sets;10 reps;Limitations    Wall Squat Limitations  5" holds, cues for proper weight shift over RLE    SLS  Rt only no UE's 30"interval with max of 4"      Knee/Hip Exercises: Seated   Long Arc Quad  Right;15 reps      Knee/Hip Exercises: Supine   Knee Extension  AROM    Knee Extension Limitations  10    Knee Flexion  AROM    Knee Flexion Limitations  98             PT Education - 12/18/17 1055  Education provided  Yes    Education Details  updated HEP to include LAQ, single leg stance and wallslides    Person(s) Educated  Patient    Methods  Explanation;Demonstration;Tactile cues;Handout;Verbal cues    Comprehension  Verbalized understanding;Returned demonstration;Verbal cues required;Tactile cues required       PT Short Term Goals - 12/04/17 0947      PT SHORT TERM GOAL #1   Title  Pt will be independent with HEP and perform consistently in order to decrease pain and maximize ROM.    Baseline  3/20: reports compliance with HEP 2x daily    Time  3    Period  Weeks    Status  Achieved      PT SHORT TERM GOAL #2   Title  Pt will have improved R knee AROM from 5-95 deg in order to decrease pain and maximize gait.    Baseline  6/5: 12-98 AROM, 8-100PROM    Time  3    Period  Weeks    Status  Partially Met      PT SHORT TERM GOAL #3   Title  Pt will have decreased joint line edema by 4cm in order to decrease pain and maximize ROM.    Baseline  6/5: 41cm joint line (no change from last reassessment)    Time  3     Period  Weeks    Status  On-going      PT SHORT TERM GOAL #4   Title  Pt will be able to perform bil SLS for at least 10 sec with no UE in order to maximize gait and balance.    Baseline  6/5: <2 sec BLE this date    Time  3    Period  Weeks    Status  On-going        PT Long Term Goals - 12/04/17 0948      PT LONG TERM GOAL #1   Title  Pt will have improved R knee AROM to 0-115deg in order to maximize gait, stair ambulation, and overall return to PLOF.    Baseline  6/5: 12-98AROM, 8-100PROM    Time  6    Period  Weeks    Status  On-going      PT LONG TERM GOAL #2   Title  Pt will have improved MMT of all muscle groups tested by 1 grade or > in order to maximize gait, balance, and overall functional strength.    Baseline  6/5: see MMT    Time  3    Period  Weeks    Status  Partially Met      PT LONG TERM GOAL #3   Title  Pt will be able to perform 5xSTS in 12 sec or < with proper mechanics and no UE support to demo improved functional BLE strength and balance.    Baseline  6/5: 17sec    Time  3    Period  Weeks    Status  On-going      PT LONG TERM GOAL #4   Title  Pt will be able to perform the TUG with LRAD in 10 sec or < to demo improved functional strength, balance, and maximize community ambulation.    Baseline  6/5: 13 sec no AD, was 15 sec SPC    Time  3    Period  Weeks    Status  On-going      PT LONG TERM GOAL #5  Title  Pt will have improved 3MWT by 170f or > with LRAD and gait WFL in order to demo improved functional strength, gait, and maximize community access.    Baseline  6/5: 5346fwith SPC, deviations continue; was 60231fW gait deviations continue    Time  3    Period  Weeks    Status  On-going            Plan - 12/18/17 1056    Clinical Impression Statement  Continued with established POC with focus on improving ROM and strengthening per PT initiation.  Instructed per PT to add strengthening to HEP, added SLS, wallslides and LAQ.  Pt  able to complete exercises wtih cues to complete at slower, controlled pace and in proper alignment when completing wall slides.  Noted weakness with all actvities and difficulty maintaining SLS greater than 4 seconds without UE assist.  Clinic still has not received signed order for JAS brace.  AROM remains at 10-98 degrees with noted induration and scar tissue perimeter of knee.     Rehab Potential  Poor    PT Frequency  1x / week one f/u visit on 11/11/17 after check up with MD on 11/06/17    PT Duration  6 weeks    PT Treatment/Interventions  ADLs/Self Care Home Management;Cryotherapy;Electrical Stimulation;Moist Heat;Ultrasound;DME Instruction;Gait training;Stair training;Functional mobility training;Therapeutic activities;Therapeutic exercise;Balance training;Neuromuscular re-education;Patient/family education;Manual techniques;Scar mobilization;Passive range of motion;Dry needling;Energy conservation;Taping;Biofeedback    PT Next Visit Plan  continue heavy emphasis on strengthening and updating his HEP to include such; continue cybex and leg press. Continue Myofascial and soft tissue techniques PRN.   Next session add vector stance and sidestepping with band.  f/u on JAS brace referral    PT Home Exercise Plan  eval: heel slides and supine HS stretch; 3/1: seated knee flexion stretch, quad set; 3/6: standing calf stretch, SAQ; 3/18: prone quad stretch with belt; 3/29: prone knee hang; 6/12: wall squats, standing knee flexion  6/19:  LAQ, SLS, wallslides    Consulted and Agree with Plan of Care  Patient       Patient will benefit from skilled therapeutic intervention in order to improve the following deficits and impairments:  Abnormal gait, Decreased activity tolerance, Decreased balance, Decreased mobility, Decreased range of motion, Decreased scar mobility, Decreased strength, Difficulty walking, Hypomobility, Increased edema, Increased fascial restricitons, Increased muscle spasms, Impaired  flexibility, Improper body mechanics, Pain, Decreased endurance  Visit Diagnosis: Stiffness of right knee, not elsewhere classified  Difficulty in walking, not elsewhere classified  Muscle weakness (generalized)  Localized edema     Problem List Patient Active Problem List   Diagnosis Date Noted  . Primary osteoarthritis of right knee 07/30/2017  . S/P TKR (total knee replacement) using cement, right 07/30/2017  . Colon cancer screening 10/28/2013  . Umbilical hernia without mention of obstruction or gangrene 10/28/2013   AmyTeena IraniTA/CLT [id number] Irani19/2019, 11:03 AM  [id number]: [phone]:  [phone]: HarJASPREET HOLLINGSN: [id number] of Birth: [birth year]

## 2017-12-19 ENCOUNTER — Telehealth (HOSPITAL_COMMUNITY): Payer: Self-pay

## 2017-12-19 NOTE — Telephone Encounter (Signed)
Called to schedule - wife states they will call when they get home to schedule so they will have their calendar. NF 12/19/17

## 2017-12-20 ENCOUNTER — Encounter (HOSPITAL_COMMUNITY): Payer: BLUE CROSS/BLUE SHIELD

## 2018-01-03 ENCOUNTER — Ambulatory Visit (HOSPITAL_COMMUNITY): Payer: Self-pay

## 2018-01-03 ENCOUNTER — Telehealth (HOSPITAL_COMMUNITY): Payer: Self-pay

## 2018-01-03 NOTE — Telephone Encounter (Signed)
He will call his insurance and see what is wrong - He doesn't want to be Self-Pay

## 2018-01-06 ENCOUNTER — Telehealth (HOSPITAL_COMMUNITY): Payer: Self-pay

## 2018-01-06 ENCOUNTER — Ambulatory Visit (HOSPITAL_COMMUNITY): Payer: Self-pay

## 2018-01-06 NOTE — Telephone Encounter (Signed)
No show #1; called and spoke to pt's wife regarding no show. She stated that they were still working on his insurance and she would call us back when they hear from that. Reminded her of his next scheduled appointment and asked them to call and cancel if they couldn't make it and she verbalized understanding.   Geraldine Solar PT, DPT

## 2018-01-08 ENCOUNTER — Encounter (INDEPENDENT_AMBULATORY_CARE_PROVIDER_SITE_OTHER): Payer: Self-pay | Admitting: Orthopaedic Surgery

## 2018-01-08 ENCOUNTER — Telehealth (HOSPITAL_COMMUNITY): Payer: Self-pay

## 2018-01-08 ENCOUNTER — Ambulatory Visit (INDEPENDENT_AMBULATORY_CARE_PROVIDER_SITE_OTHER): Payer: Self-pay | Admitting: Orthopaedic Surgery

## 2018-01-08 VITALS — BP 125/74 | HR 64 | Ht 67.0 in | Wt 240.0 lb

## 2018-01-08 DIAGNOSIS — Z96651 Presence of right artificial knee joint: Secondary | ICD-10-CM

## 2018-01-08 NOTE — Telephone Encounter (Signed)
He wants to be on hold until he can work out his [employer] coverage and he will call us back to r/s .NF 01/08/18

## 2018-01-08 NOTE — Progress Notes (Signed)
Office Visit Note   Patient: [name]           Date of Birth: [birth year]           MRN: [id number] Visit Date: 01/08/2018              Requested by: Celene Squibb, MD [address] [name], [name] [address] PCP: Celene Squibb, MD   Assessment & Plan: Visit Diagnoses:  1. History of total right knee replacement     Plan: Mr. [name] is status post primary right total knee replacement in January of thousand 19.  His course has been very slow.  He still is having some stiffness of his knee and uses a cane.  He is is on short-term disability and plans to file for long-term disability.  Urged him to continue working with his exercises and range of motion.  Not taking any pain medicines.  Report he is continued short-term disability I do not believe he is capable of performing his prior employment activity  Follow-Up Instructions: Return in about 3 months (around 04/10/2018).   Orders:  No orders of the defined types were placed in this encounter.  No orders of the defined types were placed in this encounter.     Procedures: No procedures performed   Clinical Data: No additional findings.   Subjective: Chief Complaint  Patient presents with  . Follow-up    R KNEE STILL HAVING PAIN AND STIFFNESS 07/30/2017 STILL DOING PT  6 months status post primary right total knee replacement.  Mr. [name] till having some stiffness and soreness of his knee he is not really having any "pain".  He uses a cane for ambulation.  Biggest issue is stiffness.  He is aware that he "makes a lot of scar tissue".  Not having any fever or chills.  Not experiencing any calf pain.  He is on short-term disability and plans to file for long-term disability.  HPI  Review of Systems  Constitutional: Negative for fatigue and fever.  HENT: Negative for ear pain.   Eyes: Negative for pain.  Respiratory: Negative for cough and shortness of breath.   Cardiovascular: Positive for leg swelling.    Gastrointestinal: Negative for constipation and diarrhea.  Genitourinary: Negative for difficulty urinating.  Musculoskeletal: Negative for back pain and neck pain.  Skin: Negative for rash.  Allergic/Immunologic: Negative for food allergies.  Neurological: Positive for weakness. Negative for numbness.  Hematological: Does not bruise/bleed easily.  Psychiatric/Behavioral: Negative for sleep disturbance.     Objective: Vital Signs: BP 125/74 (BP Location: Right Arm, Patient Position: Sitting, Cuff Size: Normal)   Pulse 64   Ht 5\' 7"  (1.702 m)   Wt 240 lb (108.9 kg)   BMI 37.59 kg/m   Physical Exam  Ortho Exam awake alert and oriented x3.  Comfortable sitting.  Just about full extension of his right knee and at 95 to 98 degrees of flexion.  No instability.  No effusion.  Knee was minimally warm compared to the left.  No hip pain.  No thigh discomfort.  His right knee is somewhat more hypertrophic than the left.  Minimal swelling of his ankle.  Neurovascular exam intact.  Specialty Comments:  No specialty comments available.  Imaging: No results found.   PMFS History: Patient Active Problem List   Diagnosis Date Noted  . Primary osteoarthritis of right knee 07/30/2017  . S/P TKR (total knee replacement) using cement, right 07/30/2017  . Colon cancer  screening 10/28/2013  . Umbilical hernia without mention of obstruction or gangrene 10/28/2013   Past Medical History:  Diagnosis Date  . Arthritis    "right knee" (07/30/2017)  . CAP (community acquired pneumonia) 2018  . Hypertension   . Hypertension   . Wears glasses     Family History  Problem Relation Age of Onset  . Colon cancer Neg Hx   . Colon polyps Neg Hx     Past Surgical History:  Procedure Laterality Date  . EYE SURGERY    . JOINT REPLACEMENT    . RETINAL DETACHMENT SURGERY Left 2000s  . TOTAL KNEE ARTHROPLASTY Right 07/30/2017  . TOTAL KNEE ARTHROPLASTY Right 07/30/2017   Procedure: RIGHT TOTAL KNEE  ARTHROPLASTY;  Surgeon: [name], MD;  Location: [facility];  Service: Orthopedics;  Laterality: Right;  Marland Kitchen VASECTOMY     Social History   Occupational History  . Not on file  Tobacco Use  . Smoking status: Never Smoker  . Smokeless tobacco: Never Used  Substance and Sexual Activity  . Alcohol use: No  . Drug use: No  . Sexual activity: Never

## 2018-01-17 ENCOUNTER — Encounter (HOSPITAL_COMMUNITY): Payer: BLUE CROSS/BLUE SHIELD

## 2018-01-24 ENCOUNTER — Encounter (HOSPITAL_COMMUNITY): Payer: BLUE CROSS/BLUE SHIELD

## 2018-01-27 ENCOUNTER — Telehealth: Payer: Self-pay

## 2018-01-27 NOTE — Telephone Encounter (Signed)
Patient's wife called to see if we had received long term disability paperwork. Please advise.

## 2018-01-28 NOTE — Telephone Encounter (Signed)
Spoke with wife and gave her Tirsa's  number to assist with questions re payment. Pt states she will drop off paperwork at [facility] office to be sent over here to be sent to [facility]

## 2018-03-13 ENCOUNTER — Encounter (HOSPITAL_COMMUNITY): Payer: Self-pay

## 2018-03-13 NOTE — Therapy (Signed)
[facility] [address] [place], [place], [address] Phone: [phone]   Fax:  [phone]  Patient Details  Name: STEPHENSON CICHY MRN: [id number] Date of Birth: 12/12/1955 Referring Provider:  No ref. provider found  Encounter Date: 03/13/2018  PHYSICAL THERAPY DISCHARGE SUMMARY  Visits from Start of Care: 32  Current functional level related to goals / functional outcomes: See last treatment note   Remaining deficits: See last treatment note   Education / Equipment: See last treatment note  Plan: Patient agrees to discharge.  Patient goals were partially met. Patient is being discharged due to not returning since the last visit.  ?????      Geraldine Solar PT, [facility] [address] [place], [place], [address] Phone: [phone]   Fax:  [phone]

## 2018-03-26 ENCOUNTER — Ambulatory Visit (INDEPENDENT_AMBULATORY_CARE_PROVIDER_SITE_OTHER): Payer: Self-pay

## 2018-03-26 ENCOUNTER — Encounter (INDEPENDENT_AMBULATORY_CARE_PROVIDER_SITE_OTHER): Payer: Self-pay | Admitting: Orthopaedic Surgery

## 2018-03-26 ENCOUNTER — Ambulatory Visit (INDEPENDENT_AMBULATORY_CARE_PROVIDER_SITE_OTHER): Payer: Self-pay | Admitting: Orthopaedic Surgery

## 2018-03-26 VITALS — BP 137/66 | HR 67 | Ht 66.0 in | Wt 264.0 lb

## 2018-03-26 DIAGNOSIS — M25511 Pain in right shoulder: Secondary | ICD-10-CM

## 2018-03-26 DIAGNOSIS — G8929 Other chronic pain: Secondary | ICD-10-CM

## 2018-03-26 MED ORDER — BUPIVACAINE HCL 0.5 % IJ SOLN
2.0000 mL | INTRAMUSCULAR | Status: AC | PRN
  Administered 2018-03-26: 2 mL via INTRA_ARTICULAR

## 2018-03-26 MED ORDER — METHYLPREDNISOLONE ACETATE 40 MG/ML IJ SUSP
80.0000 mg | INTRAMUSCULAR | Status: AC | PRN
Start: 2018-03-26 — End: 2018-03-26
  Administered 2018-03-26: 80 mg

## 2018-03-26 MED ORDER — LIDOCAINE HCL 2 % IJ SOLN
2.0000 mL | INTRAMUSCULAR | Status: AC | PRN
  Administered 2018-03-26: 2 mL
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
## 2018-04-15 ENCOUNTER — Encounter: Payer: Self-pay | Admitting: Gastroenterology

## 2018-04-30 ENCOUNTER — Ambulatory Visit (INDEPENDENT_AMBULATORY_CARE_PROVIDER_SITE_OTHER): Payer: Self-pay | Admitting: Orthopaedic Surgery

## 2018-05-07 ENCOUNTER — Encounter (INDEPENDENT_AMBULATORY_CARE_PROVIDER_SITE_OTHER): Payer: Self-pay | Admitting: Orthopaedic Surgery

## 2018-05-07 ENCOUNTER — Ambulatory Visit (INDEPENDENT_AMBULATORY_CARE_PROVIDER_SITE_OTHER): Payer: Self-pay | Admitting: Orthopaedic Surgery

## 2018-05-07 VITALS — BP 134/79 | HR 63 | Ht 66.0 in | Wt 264.0 lb

## 2018-05-07 DIAGNOSIS — G8929 Other chronic pain: Secondary | ICD-10-CM

## 2018-05-07 DIAGNOSIS — Z96651 Presence of right artificial knee joint: Secondary | ICD-10-CM

## 2018-05-07 DIAGNOSIS — M25511 Pain in right shoulder: Secondary | ICD-10-CM | POA: Insufficient documentation
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
## 2018-07-31 ENCOUNTER — Telehealth (HOSPITAL_COMMUNITY): Payer: Self-pay | Admitting: Physical Therapy

## 2018-07-31 NOTE — Telephone Encounter (Signed)
Called came by to sign release of information for last date of service 12/18/2017. Gave reqport to patient. NF 07/31/2018

## 2018-10-08 ENCOUNTER — Ambulatory Visit (INDEPENDENT_AMBULATORY_CARE_PROVIDER_SITE_OTHER): Payer: Medicaid Other

## 2018-10-08 ENCOUNTER — Encounter (INDEPENDENT_AMBULATORY_CARE_PROVIDER_SITE_OTHER): Payer: Self-pay | Admitting: Orthopaedic Surgery

## 2018-10-08 ENCOUNTER — Ambulatory Visit (INDEPENDENT_AMBULATORY_CARE_PROVIDER_SITE_OTHER): Payer: Medicaid Other | Admitting: Orthopaedic Surgery

## 2018-10-08 ENCOUNTER — Other Ambulatory Visit: Payer: Self-pay

## 2018-10-08 VITALS — Ht 66.0 in | Wt 264.0 lb

## 2018-10-08 DIAGNOSIS — G8929 Other chronic pain: Secondary | ICD-10-CM

## 2018-10-08 DIAGNOSIS — M25561 Pain in right knee: Secondary | ICD-10-CM

## 2018-10-08 DIAGNOSIS — Z96651 Presence of right artificial knee joint: Secondary | ICD-10-CM

## 2018-10-08 NOTE — Progress Notes (Signed)
Office Visit Note   Patient: [name]           Date of Birth: 06-10-1956           MRN: [id number] Visit Date: 10/08/2018              Requested by: Celene Squibb, MD [place], [place] [address] PCP: Celene Squibb, MD   Assessment & Plan: Visit Diagnoses:  1. History of total right knee replacement   2. Chronic pain of right knee     Plan: [name] is status post right total knee replacement in January 2019.  He relates that he still uses a cane because of his "balance".  He has difficulty trying to explain his symptoms but he still does not really have any issues with his right knee other than it is "stiff on his exercises.  He is on long-term disability.  He is not had any fever or chills feel that his knee is unstable and does not hurt.  He is not taking any medicines.  Range of motion is 0 to about 97 degrees.  He opens a little bit medially with a valgus stress but clinically does not have any feeling of instability.  I would urge him to continue to work on his exercises.  I do not think he can return to his previous level of employment so he will continue on long-term disability. check him back in about a year.  Discussed need for antibiotics with any invasive procedure.   Follow-Up Instructions: Return if symptoms worsen or fail to improve.   Orders:  Orders Placed This Encounter  Procedures  . XR KNEE 3 VIEW RIGHT   No orders of the defined types were placed in this encounter.     Procedures: No procedures performed   Clinical Data: No additional findings.   Subjective: Chief Complaint  Patient presents with  . Right Knee - Follow-up    DOS 07/30/17  Patient presents today for follow up on his right knee. He had a right total knee arthroplasty on 07/30/17. Patient states that he has pain and stiffness. He has been doing home exercises. He takes Tylenol as needed.  Has a further question [name] he notes that pain is not an issue but  rather stiffness.  This is the same problem that he had postoperatively with only about 95 degrees of flexion.  He does not have any feeling of his knee being unstable but does have an issue with "balance" is not sure if the problem is related to his knee or his overall balance but he still "uses the cane".  He is not had any fever or chills  HPI  Review of Systems   Objective: Vital Signs: Ht 5\' 6"  (1.676 m)   Wt 264 lb (119.7 kg)   BMI 42.61 kg/m   Physical Exam Constitutional:      Appearance: He is well-developed.  Eyes:     Pupils: Pupils are equal, round, and reactive to light.  Pulmonary:     Effort: Pulmonary effort is normal.  Skin:    General: Skin is warm and dry.  Neurological:     Mental Status: He is alert and oriented to person, place, and time.  Psychiatric:        Behavior: Behavior normal.     Ortho Exam awake alert and oriented x3.  Comfortable sitting.  As always he has difficulty explaining his symptoms.  Not  have a feeling of instability.  His right knee range of motion was 0 to about 97 degrees with a goniometer.  He opens up several millimeters medially with a valgus stress but none laterally.  Quick extension.  No patella pain.  No localized areas of tenderness.  He does have some hypertrophic changes of his right knee compared to his left but that did not appear to have any effusion.  The right knee was not hot or red.  Mild ankle swelling.  Significant pes planus bilaterally  Specialty Comments:  No specialty comments available.  Imaging: Xr Knee 3 View Right  Result Date: 10/08/2018 Films of the right total knee replacement were performed and compared to films that were performed in February 2019.  There is no change in the position of the components.  There is no obvious abnormality.  There is some lucency between the methacrylate bone interface in the tibia but not much different than that previously.  No obvious loosening.  As of any acute changes.     PMFS History: Patient Active Problem List   Diagnosis Date Noted  . Chronic pain of right knee 10/08/2018  . Chronic right shoulder pain 05/07/2018  . Primary osteoarthritis of right knee 07/30/2017  . History of total right knee replacement 07/30/2017  . Colon cancer screening 10/28/2013  . Umbilical hernia without mention of obstruction or gangrene 10/28/2013   Past Medical History:  Diagnosis Date  . Arthritis    "right knee" (07/30/2017)  . CAP (community acquired pneumonia) 2018  . Hypertension   . Hypertension   . Wears glasses     Family History  Problem Relation Age of Onset  . Colon cancer Neg Hx   . Colon polyps Neg Hx     Past Surgical History:  Procedure Laterality Date  . EYE SURGERY    . JOINT REPLACEMENT    . RETINAL DETACHMENT SURGERY Left 2000s  . TOTAL KNEE ARTHROPLASTY Right 07/30/2017  . TOTAL KNEE ARTHROPLASTY Right 07/30/2017   Procedure: RIGHT TOTAL KNEE ARTHROPLASTY;  Surgeon: [name], MD;  Location: [facility];  Service: Orthopedics;  Laterality: Right;  Marland Kitchen VASECTOMY     Social History   Occupational History  . Not on file  Tobacco Use  . Smoking status: Never Smoker  . Smokeless tobacco: Never Used  Substance and Sexual Activity  . Alcohol use: No  . Drug use: No  . Sexual activity: Never

## 2018-11-12 ENCOUNTER — Other Ambulatory Visit: Payer: Self-pay

## 2018-11-12 ENCOUNTER — Encounter: Payer: Self-pay | Admitting: Orthopaedic Surgery

## 2018-11-12 ENCOUNTER — Ambulatory Visit (INDEPENDENT_AMBULATORY_CARE_PROVIDER_SITE_OTHER): Payer: Medicaid Other | Admitting: Orthopaedic Surgery

## 2018-11-12 VITALS — Ht 66.0 in | Wt 264.0 lb

## 2018-11-12 DIAGNOSIS — M25511 Pain in right shoulder: Secondary | ICD-10-CM

## 2018-11-12 DIAGNOSIS — G8929 Other chronic pain: Secondary | ICD-10-CM
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
## 2018-11-12 NOTE — Addendum Note (Signed)
Addended by: Lendon Collar on: 11/12/2018 02:13 PM   Modules accepted: Orders

## 2018-11-27 ENCOUNTER — Other Ambulatory Visit: Payer: Self-pay | Admitting: Orthopaedic Surgery

## 2018-11-27 ENCOUNTER — Telehealth: Payer: Self-pay | Admitting: Orthopaedic Surgery

## 2018-11-27 DIAGNOSIS — G8929 Other chronic pain: Secondary | ICD-10-CM

## 2018-11-27 NOTE — Telephone Encounter (Signed)
Patient's wife [name] called stating she was returning [name]'s call.

## 2018-11-27 NOTE — Telephone Encounter (Signed)
Tried to call patient. No answer. Left message for patient to call back so I can relay the following information. MRI was denied due no recent therapy. Sent referral in for therapy and then patient needs to be scheduled for a six week follow up. At that point, MRI can be ordered again.

## 2018-11-28 NOTE — Telephone Encounter (Signed)
I called patient's wife with MRI denial, patient wants to go to [facility] for Rehab, number given to patient to schedule rehab. Office having difficulty reaching patient.

## 2018-12-01 ENCOUNTER — Other Ambulatory Visit: Payer: Self-pay | Admitting: Orthopaedic Surgery

## 2018-12-01 DIAGNOSIS — M25511 Pain in right shoulder: Secondary | ICD-10-CM

## 2018-12-01 DIAGNOSIS — G8929 Other chronic pain: Secondary | ICD-10-CM

## 2018-12-04 ENCOUNTER — Telehealth: Payer: Self-pay | Admitting: *Deleted

## 2018-12-04 ENCOUNTER — Ambulatory Visit: Payer: Medicaid Other | Admitting: Orthopaedic Surgery

## 2018-12-04 NOTE — Telephone Encounter (Signed)
IC advised potentially could cause increase blood sugars.  However advised per Dr Marlou Sa could use toradol or low dose cortisone.

## 2018-12-04 NOTE — Telephone Encounter (Signed)
Pt is supposed to come in today for a cortisone shot. Pt wife forgot to mention that he is on Metformin for diabetes and she just wants to make sure that it is not going to interfere with the shot. Wife is requesting a return call from [place] before 1pm today.

## 2018-12-17 ENCOUNTER — Encounter: Payer: Self-pay | Admitting: Orthopaedic Surgery

## 2018-12-17 ENCOUNTER — Ambulatory Visit (INDEPENDENT_AMBULATORY_CARE_PROVIDER_SITE_OTHER): Payer: Medicaid Other | Admitting: Orthopaedic Surgery

## 2018-12-17 ENCOUNTER — Other Ambulatory Visit: Payer: Self-pay

## 2018-12-17 VITALS — Ht 66.0 in

## 2018-12-17 DIAGNOSIS — M25511 Pain in right shoulder: Secondary | ICD-10-CM | POA: Diagnosis not present

## 2018-12-17 DIAGNOSIS — G8929 Other chronic pain: Secondary | ICD-10-CM | POA: Diagnosis not present

## 2018-12-17 MED ORDER — LIDOCAINE HCL 2 % IJ SOLN
2.0000 mL | INTRAMUSCULAR | Status: AC | PRN
  Administered 2018-12-17: 2 mL

## 2018-12-17 MED ORDER — METHYLPREDNISOLONE ACETATE 40 MG/ML IJ SUSP
80.0000 mg | INTRAMUSCULAR | Status: AC | PRN
  Administered 2018-12-17: 12:00:00 80 mg via INTRA_ARTICULAR

## 2018-12-17 MED ORDER — BUPIVACAINE HCL 0.5 % IJ SOLN
2.0000 mL | INTRAMUSCULAR | Status: AC | PRN
  Administered 2018-12-17: 12:00:00 2 mL via INTRA_ARTICULAR

## 2018-12-17 NOTE — Progress Notes (Signed)
Office Visit Note   Patient: [name]           Date of Birth: Dec 31, 1955           MRN: [id number] Visit Date: 12/17/2018              Requested by: Celene Squibb, MD [place],  [place] [address] PCP: Celene Squibb, MD   Assessment & Plan: Visit Diagnoses:  1. Chronic right shoulder pain     Plan: Persistent right shoulder pain with evidence of impingement.  I had ordered an MRI scan but apparently it was denied without 6 weeks of physical therapy.  Physical therapy has been ordered at any pain.  I will reinject the subacromial space and monitor his response.  Repeat MRI scan at the end of his therapy if he still symptomatic.  Follow-Up Instructions: Return in about 6 weeks (around 01/28/2019).   Orders:  Orders Placed This Encounter  Procedures  . Large Joint Inj: R subacromial bursa   No orders of the defined types were placed in this encounter.     Procedures: Large Joint Inj: R subacromial bursa on 12/17/2018 11:32 AM Indications: pain and diagnostic evaluation Details: 25 G 1.5 in needle, anterolateral approach  Arthrogram: No  Medications: 2 mL lidocaine 2 %; 2 mL bupivacaine 0.5 %; 80 mg methylPREDNISolone acetate 40 MG/ML Consent was given by the patient. Immediately prior to procedure a time out was called to verify the correct patient, procedure, equipment, support staff and site/side marked as required. Patient was prepped and draped in the usual sterile fashion.       Clinical Data: No additional findings.   Subjective: Chief Complaint  Patient presents with  . Right Shoulder - Follow-up  Patient presents today for a one month follow up on his right shoulder. He is wanting a cortisone injection today. He takes Tylenol for pain. Since his last visit he has been diagnosed with diabetes and takes Metformin. He has not had an MRI yet.  MRI scan was denied as he had not had physical therapy.  That has been scheduled he like to have  another shot of cortisone  HPI  Review of Systems   Objective: Vital Signs: Ht 5\' 6"  (1.676 m)   BMI 42.61 kg/m   Physical Exam Constitutional:      Appearance: He is well-developed.  Eyes:     Pupils: Pupils are equal, round, and reactive to light.  Pulmonary:     Effort: Pulmonary effort is normal.  Skin:    General: Skin is warm and dry.  Neurological:     Mental Status: He is alert and oriented to person, place, and time.  Psychiatric:        Behavior: Behavior normal.     Ortho Exam right shoulder with positive impingement and minimally positive empty can testing.  Skin intact.  No pain at the acromioclavicular joint.  Mild discomfort in the subacromial region anteriorly and laterally.  Biceps intact.  No pain referable to his right shoulder with range of motion of the cervical spine.  Able to place right arm fully overhead with circuitous motion related to his pain.  No popping or clicking. Specialty Comments:  No specialty comments available.  Imaging: No results found.   PMFS History: Patient Active Problem List   Diagnosis Date Noted  . Chronic pain of right knee 10/08/2018  . Chronic right shoulder pain 05/07/2018  . Primary osteoarthritis of  right knee 07/30/2017  . History of total right knee replacement 07/30/2017  . Colon cancer screening 10/28/2013  . Umbilical hernia without mention of obstruction or gangrene 10/28/2013   Past Medical History:  Diagnosis Date  . Arthritis    "right knee" (07/30/2017)  . CAP (community acquired pneumonia) 2018  . Hypertension   . Hypertension   . Wears glasses     Family History  Problem Relation Age of Onset  . Colon cancer Neg Hx   . Colon polyps Neg Hx     Past Surgical History:  Procedure Laterality Date  . EYE SURGERY    . JOINT REPLACEMENT    . RETINAL DETACHMENT SURGERY Left 2000s  . TOTAL KNEE ARTHROPLASTY Right 07/30/2017  . TOTAL KNEE ARTHROPLASTY Right 07/30/2017   Procedure: RIGHT TOTAL KNEE  ARTHROPLASTY;  Surgeon: [name], MD;  Location: [facility];  Service: Orthopedics;  Laterality: Right;  Marland Kitchen VASECTOMY     Social History   Occupational History  . Not on file  Tobacco Use  . Smoking status: Never Smoker  . Smokeless tobacco: Never Used  Substance and Sexual Activity  . Alcohol use: No  . Drug use: No  . Sexual activity: Never

## 2018-12-18 ENCOUNTER — Ambulatory Visit (HOSPITAL_COMMUNITY): Payer: Medicaid Other | Attending: Orthopaedic Surgery | Admitting: Occupational Therapy

## 2018-12-18 ENCOUNTER — Encounter (HOSPITAL_COMMUNITY): Payer: Self-pay | Admitting: Occupational Therapy

## 2018-12-18 DIAGNOSIS — G8929 Other chronic pain: Secondary | ICD-10-CM | POA: Diagnosis present

## 2018-12-18 DIAGNOSIS — M25511 Pain in right shoulder: Secondary | ICD-10-CM | POA: Insufficient documentation

## 2018-12-18 DIAGNOSIS — R29898 Other symptoms and signs involving the musculoskeletal system: Secondary | ICD-10-CM | POA: Insufficient documentation
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
## 2018-12-18 NOTE — Patient Instructions (Signed)

## 2018-12-19 ENCOUNTER — Telehealth (HOSPITAL_COMMUNITY): Payer: Self-pay | Admitting: Internal Medicine

## 2018-12-19 NOTE — Telephone Encounter (Signed)
12/19/18  I called at the therapist request to offer patient an apt on 6/25 since the schedule was opened up.  I scheduled him and confirmed with patient and wife.

## 2018-12-25 ENCOUNTER — Ambulatory Visit (HOSPITAL_COMMUNITY): Payer: Medicaid Other

## 2018-12-29 ENCOUNTER — Ambulatory Visit (HOSPITAL_COMMUNITY): Payer: Medicaid Other | Admitting: Occupational Therapy

## 2018-12-29 ENCOUNTER — Other Ambulatory Visit: Payer: Self-pay

## 2018-12-29 ENCOUNTER — Encounter (HOSPITAL_COMMUNITY): Payer: Self-pay | Admitting: Occupational Therapy

## 2018-12-29 DIAGNOSIS — M25511 Pain in right shoulder: Secondary | ICD-10-CM | POA: Diagnosis not present

## 2018-12-29 DIAGNOSIS — R29898 Other symptoms and signs involving the musculoskeletal system: Secondary | ICD-10-CM

## 2018-12-29 DIAGNOSIS — G8929 Other chronic pain: Secondary | ICD-10-CM

## 2018-12-29 NOTE — Therapy (Signed)
[facility] [place], [place], [address] Phone: [phone]   Fax:  [phone]  Occupational Therapy Treatment  Patient Details  Name: [name] MRN: [id number] Date of Birth: Dec 29, 1955 Referring Provider (OT): Dr. Joni Fears   Encounter Date: 12/29/2018  OT End of Session - 12/29/18 0936    Visit Number  2    Number of Visits  4    Date for OT Re-Evaluation  01/22/19    Authorization Type  Medicaid [place] Time Period  3 visits approved 6/23-7/13    Authorization - Visit Number  1    Authorization - Number of Visits  3    OT Start Time  0921    OT Stop Time  1002    OT Time Calculation (min)  41 min    Activity Tolerance  Patient tolerated treatment well    Behavior During Therapy  [facility] for tasks assessed/performed       Past Medical History:  Diagnosis Date  . Arthritis    "right knee" (07/30/2017)  . CAP (community acquired pneumonia) 2018  . Hypertension   . Hypertension   . Wears glasses     Past Surgical History:  Procedure Laterality Date  . EYE SURGERY    . JOINT REPLACEMENT    . RETINAL DETACHMENT SURGERY Left 2000s  . TOTAL KNEE ARTHROPLASTY Right 07/30/2017  . TOTAL KNEE ARTHROPLASTY Right 07/30/2017   Procedure: RIGHT TOTAL KNEE ARTHROPLASTY;  Surgeon: [name], MD;  Location: [facility];  Service: Orthopedics;  Laterality: Right;  Marland Kitchen VASECTOMY      There were no vitals filed for this visit.  Subjective Assessment - 12/29/18 0921    Subjective   S: I think that shot kicked in and it's feeling good.    Currently in Pain?  No/denies         [facility] OT Assessment - 12/29/18 0920      Assessment   Medical Diagnosis  chronic right shoulder pain      Precautions   Precautions  None               OT Treatments/Exercises (OP) - 12/29/18 0923      Exercises   Exercises  Shoulder      Shoulder Exercises: Supine   Protraction  PROM;5 reps;AROM;12  reps    Horizontal ABduction  PROM;5 reps;AROM;12 reps    External Rotation  PROM;5 reps;AROM;12 reps    Internal Rotation  PROM;5 reps;AROM;12 reps    Flexion  PROM;5 reps;AROM;12 reps    ABduction  PROM;5 reps;AROM;12 reps      Shoulder Exercises: Standing   Protraction  AROM;12 reps    Horizontal ABduction  AROM;12 reps    External Rotation  AROM;12 reps    Internal Rotation  AROM;12 reps    Flexion  AROM;12 reps    ABduction  AROM;12 reps    Extension  Theraband;10 reps    Theraband Level (Shoulder Extension)  Level 2 (Red)    Row  Theraband;10 reps    Theraband Level (Shoulder Row)  Level 2 (Red)    Retraction  Theraband;10 reps    Theraband Level (Shoulder Retraction)  Level 2 (Red)      Shoulder Exercises: ROM/Strengthening   UBE (Upper Arm Bike)  Level 1 3' forward 3' reverse    X to V Arms  10X    Proximal Shoulder Strengthening, Supine  10X each, no rest breaks  Proximal Shoulder Strengthening, Seated  10X each, no rest breaks      Manual Therapy   Manual Therapy  Myofascial release    Manual therapy comments  manual therapy completed separately from therapeutic exercises    Myofascial Release  myofascial release completed to right upper arm and trapezius areas to decrease pain and fascial restrictions and increase joint ROM             OT Education - 12/29/18 0944    Education Details  shoulder A/ROM    Person(s) Educated  Patient    Methods  Explanation;Demonstration;Handout    Comprehension  Verbalized understanding;Returned demonstration       OT Short Term Goals - 12/29/18 0935      OT SHORT TERM GOAL #1   Title  Pt will be provided with and educated on HEP for improving RUE use as dominant during ADL completion.    Time  4    Period  Weeks    Status  On-going    Target Date  01/22/19      OT SHORT TERM GOAL #2   Title  Pt will decrease pain in RUE to 4/10 or less to improve ability to perform driving tasks using BUE.    Time  4    Period   Weeks    Status  On-going      OT SHORT TERM GOAL #3   Title  Pt will decrease RUE fascial restrictions to minimal amounts to improve mobility required for functional reaching tasks.    Time  4    Period  Weeks    Status  On-going      OT SHORT TERM GOAL #4   Title  Pt will increase RUE A/ROM to [facility] to improve ability to reach behind back during dressing and bathing tasks.    Time  4    Period  Weeks    Status  On-going      OT SHORT TERM GOAL #5   Title  Pt will increase RUE strength to 4/5 to improve ability to perform housework and yardwork tasks.    Time  4    Period  Weeks    Status  On-going               Plan - 12/29/18 8101    Clinical Impression Statement  A: Pt reporting decreased pain since evaluation, has completed some of HEP. Initiated myofascial release and passive stretching, pt with ROM WNL. Also began A/ROM today, pt with ROM WNL in supine and standing. HEP updated for A/ROM. P completed scapular theraband and proximal shoulder strengthening as well. Max verbal cuing required for form today.    Body Structure / Function / Physical Skills  ADL;UE functional use;Fascial restriction;Flexibility;ROM;IADL;Strength    Plan  P: Follow up on HEP, add scapular theraband to HEP if pt completing with good form. Attempt 1# weight with ROM exercises. Work on improving independence with form       Patient will benefit from skilled therapeutic intervention in order to improve the following deficits and impairments:   Body Structure / Function / Physical Skills: ADL, UE functional use, Fascial restriction, Flexibility, ROM, IADL, Strength       Visit Diagnosis: 1. Chronic right shoulder pain   2. Other symptoms and signs involving the musculoskeletal system       Problem List Patient Active Problem List   Diagnosis Date Noted  . Chronic pain of right knee 10/08/2018  .  Chronic right shoulder pain 05/07/2018  . Primary osteoarthritis of right knee 07/30/2017   . History of total right knee replacement 07/30/2017  . Colon cancer screening 10/28/2013  . Umbilical hernia without mention of obstruction or gangrene 10/28/2013   Guadelupe Sabin, OTR/L  [phone] 12/29/2018, 10:03 AM  [facility] [place], [place], [address] Phone: [phone]   Fax:  [phone]  Name: [name] MRN: [id number] Date of Birth: [birth year]

## 2018-12-29 NOTE — Patient Instructions (Signed)

## 2019-01-05 ENCOUNTER — Ambulatory Visit (HOSPITAL_COMMUNITY): Payer: Medicaid Other | Attending: Orthopaedic Surgery

## 2019-01-05 ENCOUNTER — Encounter (HOSPITAL_COMMUNITY): Payer: Self-pay

## 2019-01-05 ENCOUNTER — Other Ambulatory Visit: Payer: Self-pay

## 2019-01-05 DIAGNOSIS — M25511 Pain in right shoulder: Secondary | ICD-10-CM | POA: Diagnosis present

## 2019-01-05 DIAGNOSIS — G8929 Other chronic pain: Secondary | ICD-10-CM | POA: Insufficient documentation

## 2019-01-05 DIAGNOSIS — R29898 Other symptoms and signs involving the musculoskeletal system: Secondary | ICD-10-CM | POA: Insufficient documentation

## 2019-01-05 DIAGNOSIS — M25661 Stiffness of right knee, not elsewhere classified: Secondary | ICD-10-CM | POA: Insufficient documentation

## 2019-01-05 NOTE — Patient Instructions (Signed)

## 2019-01-05 NOTE — Therapy (Signed)
[place], [place], [address] Phone: [phone]   Fax:  [phone]  Occupational Therapy Treatment  Patient Details  Name: [name] MRN: [id number] Date of Birth: 08-22-1955 Referring Provider (OT): Dr. Joni Fears   Encounter Date: 01/05/2019  OT End of Session - 01/05/19 1049    Visit Number  3    Number of Visits  4    Date for OT Re-Evaluation  01/22/19    Authorization Type  Medicaid [place] Time Period  3 visits approved 6/23-7/13    Authorization - Visit Number  2    Authorization - Number of Visits  3    OT Start Time  2633    OT Stop Time  1115    OT Time Calculation (min)  40 min    Activity Tolerance  Patient tolerated treatment well    Behavior During Therapy  [facility] for tasks assessed/performed       Past Medical History:  Diagnosis Date  . Arthritis    "right knee" (07/30/2017)  . CAP (community acquired pneumonia) 2018  . Hypertension   . Hypertension   . Wears glasses     Past Surgical History:  Procedure Laterality Date  . EYE SURGERY    . JOINT REPLACEMENT    . RETINAL DETACHMENT SURGERY Left 2000s  . TOTAL KNEE ARTHROPLASTY Right 07/30/2017  . TOTAL KNEE ARTHROPLASTY Right 07/30/2017   Procedure: RIGHT TOTAL KNEE ARTHROPLASTY;  Surgeon: [name], MD;  Location: [place];  Service: Orthopedics;  Laterality: Right;  Marland Kitchen VASECTOMY      There were no vitals filed for this visit.  Subjective Assessment - 01/05/19 1048    Subjective   S: It only hurts when I reach up overhead.    Currently in Pain?  No/denies         [facility] OT Assessment - 01/05/19 1048      Assessment   Medical Diagnosis  chronic right shoulder pain      Precautions   Precautions  None               OT Treatments/Exercises (OP) - 01/05/19 1049      Exercises   Exercises  Shoulder      Shoulder Exercises: Supine   Protraction  PROM;5 reps;Strengthening;12 reps     Protraction Weight (lbs)  2    Horizontal ABduction  PROM;5 reps;Strengthening;12 reps    Horizontal ABduction Weight (lbs)  2    External Rotation  PROM;5 reps;Strengthening;12 reps    External Rotation Weight (lbs)  2    Internal Rotation  PROM;5 reps;Strengthening    Internal Rotation Weight (lbs)  2    Flexion  PROM;5 reps;Strengthening;12 reps    Shoulder Flexion Weight (lbs)  2    ABduction  PROM;5 reps;Strengthening;12 reps    Shoulder ABduction Weight (lbs)  2      Shoulder Exercises: Standing   Protraction  Strengthening;10 reps    Protraction Weight (lbs)  2    Horizontal ABduction  Strengthening;10 reps    Horizontal ABduction Weight (lbs)  2    External Rotation  Strengthening;10 reps    External Rotation Weight (lbs)  2    Internal Rotation  Strengthening;10 reps    Internal Rotation Weight (lbs)  2    Flexion  Strengthening;10 reps    Shoulder Flexion Weight (lbs)  2    ABduction  Strengthening;10 reps  Shoulder ABduction Weight (lbs)  2    Extension  Theraband;10 reps    Theraband Level (Shoulder Extension)  Level 2 (Red)    Row  Theraband;10 reps    Theraband Level (Shoulder Row)  Level 2 (Red)    Retraction  Theraband;10 reps    Theraband Level (Shoulder Retraction)  Level 2 (Red)      Shoulder Exercises: ROM/Strengthening   X to V Arms  10X with 1#    Proximal Shoulder Strengthening, Supine  10X each, no rest breaks    Proximal Shoulder Strengthening, Seated  10X each, no rest breaks      Manual Therapy   Manual Therapy  Myofascial release    Manual therapy comments  manual therapy completed separately from therapeutic exercises    Myofascial Release  myofascial release completed to right upper arm and trapezius areas to decrease pain and fascial restrictions and increase joint ROM             OT Education - 01/05/19 1124    Education Details  red band scapular strengthening    Person(s) Educated  Patient    Methods   Explanation;Demonstration;Verbal cues;Handout;Tactile cues    Comprehension  Returned demonstration;Verbalized understanding       OT Short Term Goals - 12/29/18 0935      OT SHORT TERM GOAL #1   Title  Pt will be provided with and educated on HEP for improving RUE use as dominant during ADL completion.    Time  4    Period  Weeks    Status  On-going    Target Date  01/22/19      OT SHORT TERM GOAL #2   Title  Pt will decrease pain in RUE to 4/10 or less to improve ability to perform driving tasks using BUE.    Time  4    Period  Weeks    Status  On-going      OT SHORT TERM GOAL #3   Title  Pt will decrease RUE fascial restrictions to minimal amounts to improve mobility required for functional reaching tasks.    Time  4    Period  Weeks    Status  On-going      OT SHORT TERM GOAL #4   Title  Pt will increase RUE A/ROM to [facility] to improve ability to reach behind back during dressing and bathing tasks.    Time  4    Period  Weeks    Status  On-going      OT SHORT TERM GOAL #5   Title  Pt will increase RUE strength to 4/5 to improve ability to perform housework and yardwork tasks.    Time  4    Period  Weeks    Status  On-going               Plan - 01/05/19 1050    Clinical Impression Statement  A: Patient required verbal and visual cueing to complete exercises during session. Pt was able to progress to 1# and 2# hand weights for supine and standing exercises. Pt with full ROM passively and actively. Min fascial restrictions noted in right upper trapezius region with manual techniques completed to address.    Body Structure / Function / Physical Skills  ADL;UE functional use;Fascial restriction;Flexibility;ROM;IADL;Strength    Plan  P: D/C passive stretching. reassess to determine if 1 more visit is needed for medicaid. Request 1 more.    Consulted and Agree with Plan of  Care  Patient       Patient will benefit from skilled therapeutic intervention in order to  improve the following deficits and impairments:   Body Structure / Function / Physical Skills: ADL, UE functional use, Fascial restriction, Flexibility, ROM, IADL, Strength       Visit Diagnosis: 1. Other symptoms and signs involving the musculoskeletal system   2. Chronic right shoulder pain       Problem List Patient Active Problem List   Diagnosis Date Noted  . Chronic pain of right knee 10/08/2018  . Chronic right shoulder pain 05/07/2018  . Primary osteoarthritis of right knee 07/30/2017  . History of total right knee replacement 07/30/2017  . Colon cancer screening 10/28/2013  . Umbilical hernia without mention of obstruction or gangrene 10/28/2013   Ailene Ravel, OTR/L,CBIS  [phone]  01/05/2019, 11:27 AM  [place], [place], [address] Phone: [phone]   Fax:  [phone]  Name: [name] MRN: [id number] Date of Birth: [birth year]

## 2019-01-12 ENCOUNTER — Other Ambulatory Visit: Payer: Self-pay

## 2019-01-12 ENCOUNTER — Ambulatory Visit (HOSPITAL_COMMUNITY): Payer: Medicaid Other

## 2019-01-12 DIAGNOSIS — M25661 Stiffness of right knee, not elsewhere classified: Secondary | ICD-10-CM

## 2019-01-12 DIAGNOSIS — G8929 Other chronic pain: Secondary | ICD-10-CM

## 2019-01-12 DIAGNOSIS — R29898 Other symptoms and signs involving the musculoskeletal system: Secondary | ICD-10-CM

## 2019-01-12 NOTE — Therapy (Signed)
[place] [address] [place], [place], [address] Phone: [phone]   Fax:  [phone]  Occupational Therapy Treatment Reassessment/discharge Patient Details  Name: [name] MRN: [id number] Date of Birth: 1956/02/17 Referring Provider (OT): Dr. Joni Fears   Encounter Date: 01/12/2019  OT End of Session - 01/12/19 1108    Visit Number  4    Number of Visits  4    Authorization Type  Medicaid [place] Time Period  3 visits approved 6/23-7/13    Authorization - Visit Number  3    Authorization - Number of Visits  3    OT Start Time  9030   reasses and discharge   OT Stop Time  1105    OT Time Calculation (min)  29 min    Activity Tolerance  Patient tolerated treatment well    Behavior During Therapy  [facility] for tasks assessed/performed       Past Medical History:  Diagnosis Date  . Arthritis    "right knee" (07/30/2017)  . CAP (community acquired pneumonia) 2018  . Hypertension   . Hypertension   . Wears glasses     Past Surgical History:  Procedure Laterality Date  . EYE SURGERY    . JOINT REPLACEMENT    . RETINAL DETACHMENT SURGERY Left 2000s  . TOTAL KNEE ARTHROPLASTY Right 07/30/2017  . TOTAL KNEE ARTHROPLASTY Right 07/30/2017   Procedure: RIGHT TOTAL KNEE ARTHROPLASTY;  Surgeon: [name], MD;  Location: [place];  Service: Orthopedics;  Laterality: Right;  Marland Kitchen VASECTOMY      There were no vitals filed for this visit.  Subjective Assessment - 01/12/19 1102    Subjective   S: It only nothers me when I try to relax with my hand behind my head.    Currently in Pain?  No/denies         [facility] OT Assessment - 01/12/19 1043      Assessment   Medical Diagnosis  chronic right shoulder pain    Referring Provider (OT)  Dr. Joni Fears    Onset Date/Surgical Date  06/18/17      Precautions   Precautions  None      Prior Function   Level of Independence  Independent      ROM  / Strength   AROM / PROM / Strength  AROM;PROM;Strength      Palpation   Palpation comment  Min fascial restrictions noted in right medial deltoid region      AROM   Overall AROM Comments  Assessed seated, er/IR adducted    AROM Assessment Site  Shoulder    Right/Left Shoulder  Right    Right Shoulder Flexion  170 Degrees   previous: 102   Right Shoulder ABduction  170 Degrees   previous: 70   Right Shoulder Internal Rotation  90 Degrees   previous: 90   Right Shoulder External Rotation  75 Degrees   previous: 55     PROM   Overall PROM Comments  Assessed supine, er/IR adducted    PROM Assessment Site  Shoulder    Right/Left Shoulder  Right    Right Shoulder Flexion  180 Degrees   previous: 110   Right Shoulder ABduction  180 Degrees   previous: 102   Right Shoulder Internal Rotation  90 Degrees   previous: same   Right Shoulder External Rotation  75 Degrees   previous: 65     Strength  Overall Strength Comments  Assessed seated, er/IR adducted    Strength Assessment Site  Shoulder    Right/Left Shoulder  Right    Right Shoulder Flexion  5/5   previous: 3-/5   Right Shoulder ABduction  5/5   previous: 3-/5   Right Shoulder Internal Rotation  5/5   previous: 4/5   Right Shoulder External Rotation  5/5   previous: 3+/5              OT Treatments/Exercises (OP) - 01/12/19 1058      Exercises   Exercises  Shoulder      Shoulder Exercises: Standing   Extension  Theraband;10 reps    Theraband Level (Shoulder Extension)  Level 2 (Red)    Row  Theraband;10 reps    Theraband Level (Shoulder Row)  Level 2 (Red)    Retraction  Theraband;10 reps    Theraband Level (Shoulder Retraction)  Level 2 (Red)      Manual Therapy   Manual Therapy  Myofascial release    Manual therapy comments  manual therapy completed separately from therapeutic exercises    Myofascial Release  myofascial release completed to right upper arm and trapezius areas to decrease pain and  fascial restrictions and increase joint ROM             OT Education - 01/12/19 1102    Education Details  reviewed therapy goals and progress in therapy. Reviewed HEP. Recommended continuing with only scapular strengthening with theraband. No further OT needs at this time.    Person(s) Educated  Patient    Methods  Explanation;Verbal cues    Comprehension  Verbalized understanding;Returned demonstration       OT Short Term Goals - 01/12/19 1054      OT SHORT TERM GOAL #1   Title  Pt will be provided with and educated on HEP for improving RUE use as dominant during ADL completion.    Time  4    Period  Weeks    Status  Achieved    Target Date  01/22/19      OT SHORT TERM GOAL #2   Title  Pt will decrease pain in RUE to 4/10 or less to improve ability to perform driving tasks using BUE.    Time  4    Period  Weeks    Status  Achieved      OT SHORT TERM GOAL #3   Title  Pt will decrease RUE fascial restrictions to minimal amounts to improve mobility required for functional reaching tasks.    Time  4    Period  Weeks    Status  Achieved      OT SHORT TERM GOAL #4   Title  Pt will increase RUE A/ROM to [facility] to improve ability to reach behind back during dressing and bathing tasks.    Time  4    Period  Weeks    Status  Achieved      OT SHORT TERM GOAL #5   Title  Pt will increase RUE strength to 4/5 to improve ability to perform housework and yardwork tasks.    Time  4    Period  Weeks    Status  Achieved               Plan - 01/12/19 1109    Clinical Impression Statement  A: Reassessment completed this date. patient presents with close to full ROM actively in RUE. His strength is 5/5 in all  shoulder ranges. He reports that his ROM has improved and he notices a decrease in shoulder pain. The only deficit he voices is pain/discomfort when relaxing with his hand behind his head (external rotation with shoulder abducted). At this time, discharge is recommended as  all therapy goals have been met. Patient is to follow up with MD on Wednesday. HEP was reviewed and patient is independent with completion at home. Pt verbalizes understanding with recommendation.    Body Structure / Function / Physical Skills  ADL;UE functional use;Fascial restriction;Flexibility;ROM;IADL;Strength    Plan  P: D/C from OT services with HEP. Follow with MD on Wednesday.    Consulted and Agree with Plan of Care  Patient       Patient will benefit from skilled therapeutic intervention in order to improve the following deficits and impairments:   Body Structure / Function / Physical Skills: ADL, UE functional use, Fascial restriction, Flexibility, ROM, IADL, Strength       Visit Diagnosis: 1. Chronic right shoulder pain   2. Other symptoms and signs involving the musculoskeletal system   3. Stiffness of right knee, not elsewhere classified       Problem List Patient Active Problem List   Diagnosis Date Noted  . Chronic pain of right knee 10/08/2018  . Chronic right shoulder pain 05/07/2018  . Primary osteoarthritis of right knee 07/30/2017  . History of total right knee replacement 07/30/2017  . Colon cancer screening 10/28/2013  . Umbilical hernia without mention of obstruction or gangrene 10/28/2013   OCCUPATIONAL THERAPY DISCHARGE SUMMARY  Visits from Start of Care: 4  Current functional level related to goals / functional outcomes: See above   Remaining deficits: See above   Education / Equipment: See above Plan: Patient agrees to discharge.  Patient goals were met. Patient is being discharged due to meeting the stated rehab goals.  ?????       Ailene Ravel, OTR/L,CBIS  [phone]  01/12/2019, 11:12 AM  [place] [address] [place], [place], [address] Phone: [phone]   Fax:  [phone]  Name: [name] MRN: [id number] Date of Birth: December 02, 1955

## 2019-01-14 ENCOUNTER — Encounter: Payer: Self-pay | Admitting: Orthopaedic Surgery

## 2019-01-14 ENCOUNTER — Ambulatory Visit (INDEPENDENT_AMBULATORY_CARE_PROVIDER_SITE_OTHER): Payer: Medicaid Other | Admitting: Orthopaedic Surgery

## 2019-01-14 ENCOUNTER — Other Ambulatory Visit: Payer: Self-pay

## 2019-01-14 VITALS — BP 151/77 | HR 65 | Ht 66.0 in | Wt 264.0 lb

## 2019-01-14 DIAGNOSIS — M25511 Pain in right shoulder: Secondary | ICD-10-CM | POA: Diagnosis not present

## 2019-01-14 DIAGNOSIS — G8929 Other chronic pain: Secondary | ICD-10-CM

## 2019-01-14 NOTE — Progress Notes (Signed)
Office Visit Note   Patient: [name]           Date of Birth: 04-Jul-1955           MRN: [id number] Visit Date: 01/14/2019              Requested by: Celene Squibb, MD [place],  [place] [address] PCP: Celene Squibb, MD   Assessment & Plan: Visit Diagnoses:  1. Chronic right shoulder pain     Plan: Mr. Stavros relates that the cortisone injection recently in the right shoulder made a big difference and he no longer is symptomatic.  Will urged him to continue with his exercises and return to see me as needed  Follow-Up Instructions: Return if symptoms worsen or fail to improve.   Orders:  No orders of the defined types were placed in this encounter.  No orders of the defined types were placed in this encounter.     Procedures: No procedures performed   Clinical Data: No additional findings.   Subjective: Chief Complaint  Patient presents with  . Right Shoulder - Follow-up  Patient presents today for a one month follow up on his right shoulder. He received a cortisone injection on 12/17/2018. He has also completed his therapy and doing home exercises now. He said that he has improved. He has no complaints and no pain.   HPI  Review of Systems   Objective: Vital Signs: BP (!) 151/77   Pulse 65   Ht 5\' 6"  (1.676 m)   Wt 264 lb (119.7 kg)   BMI 42.61 kg/m   Physical Exam Constitutional:      Appearance: He is well-developed.  Eyes:     Pupils: Pupils are equal, round, and reactive to light.  Pulmonary:     Effort: Pulmonary effort is normal.  Skin:    General: Skin is warm and dry.  Neurological:     Mental Status: He is alert and oriented to person, place, and time.  Psychiatric:        Behavior: Behavior normal.     Ortho Exam full overhead motion of right shoulder with abduction well over 90 degrees.  No pain with impingement testing or empty can testing.  Good grip and good release.  No crepitation or localized areas of  tenderness.  Normal strength testing  Specialty Comments:  No specialty comments available.  Imaging: No results found.   PMFS History: Patient Active Problem List   Diagnosis Date Noted  . Chronic pain of right knee 10/08/2018  . Chronic right shoulder pain 05/07/2018  . Primary osteoarthritis of right knee 07/30/2017  . History of total right knee replacement 07/30/2017  . Colon cancer screening 10/28/2013  . Umbilical hernia without mention of obstruction or gangrene 10/28/2013   Past Medical History:  Diagnosis Date  . Arthritis    "right knee" (07/30/2017)  . CAP (community acquired pneumonia) 2018  . Hypertension   . Hypertension   . Wears glasses     Family History  Problem Relation Age of Onset  . Colon cancer Neg Hx   . Colon polyps Neg Hx     Past Surgical History:  Procedure Laterality Date  . EYE SURGERY    . JOINT REPLACEMENT    . RETINAL DETACHMENT SURGERY Left 2000s  . TOTAL KNEE ARTHROPLASTY Right 07/30/2017  . TOTAL KNEE ARTHROPLASTY Right 07/30/2017   Procedure: RIGHT TOTAL KNEE ARTHROPLASTY;  Surgeon: [name], MD;  Location: [place];  Service: Orthopedics;  Laterality: Right;  Marland Kitchen VASECTOMY     Social History   Occupational History  . Not on file  Tobacco Use  . Smoking status: Never Smoker  . Smokeless tobacco: Never Used  Substance and Sexual Activity  . Alcohol use: No  . Drug use: No  . Sexual activity: Never

## 2019-01-19 ENCOUNTER — Ambulatory Visit (HOSPITAL_COMMUNITY): Payer: Medicaid Other

## 2019-02-10 ENCOUNTER — Ambulatory Visit: Payer: Medicaid Other | Admitting: General Surgery

## 2019-03-10 ENCOUNTER — Ambulatory Visit: Payer: Medicaid Other | Admitting: General Surgery

## 2019-03-16 ENCOUNTER — Telehealth: Payer: Self-pay | Admitting: Radiology

## 2019-03-16 NOTE — Telephone Encounter (Signed)
Great! Thank You!

## 2019-03-16 NOTE — Telephone Encounter (Signed)
Received call on triage line from Dr. Hermelinda Medicus who requested peer to peer with Dr. Durward Fortes. After speaking with Ivin Booty, I advised Dr. Durward Fortes is out of the office until Thursday around 12:45p.  Dr. Hermelinda Medicus wants to discuss whether or not Dr. Durward Fortes agrees with long term disability and restrictions for patient.  He may be reached at [phone].

## 2019-03-16 NOTE — Telephone Encounter (Signed)
Please see below. It will be out of the peer to peer time frame on Thursday when you return.

## 2019-03-16 NOTE — Telephone Encounter (Signed)
I I called [name] and the peer to peer-all done! Thanks

## 2019-05-18 ENCOUNTER — Encounter: Payer: Self-pay | Admitting: Gastroenterology

## 2019-06-03 ENCOUNTER — Encounter: Payer: Self-pay | Admitting: Orthopaedic Surgery

## 2019-06-03 ENCOUNTER — Ambulatory Visit (INDEPENDENT_AMBULATORY_CARE_PROVIDER_SITE_OTHER): Payer: Medicaid Other | Admitting: Orthopaedic Surgery

## 2019-06-03 ENCOUNTER — Other Ambulatory Visit: Payer: Self-pay

## 2019-06-03 VITALS — Ht 65.5 in | Wt 280.0 lb

## 2019-06-03 DIAGNOSIS — Z96651 Presence of right artificial knee joint: Secondary | ICD-10-CM | POA: Diagnosis not present

## 2019-06-03 DIAGNOSIS — M1711 Unilateral primary osteoarthritis, right knee: Secondary | ICD-10-CM

## 2019-06-03 NOTE — Progress Notes (Signed)
Office Visit Note   Patient: [name]           Date of Birth: 1955-07-24           MRN: [id number] Visit Date: 06/03/2019              Requested by: Celene Squibb, MD [address] [place],  [place] [address] PCP: Celene Squibb, MD   Assessment & Plan: Visit Diagnoses:  1. Primary osteoarthritis of right knee   2. History of total right knee replacement     Plan: Several years status post primary right total knee replacement and doing relatively well.  No history of fever or chills.  Still has some limited range of motion from 0 to 90 degrees.  Presently on Social Security disability for a combination of factors including issue with his balance and neuropathy in both feet in addition to the knee replacement.  He was working as a Engineer, maintenance (IT) at [employer].  I do not think he is capable of going back to that position and we will plan to see him back as needed.  Doing well in terms of his right knee replacement with the exception of the limited flexion.  Given his present status this does not appear to be a functional limitation for activities of daily living  Follow-Up Instructions: Return if symptoms worsen or fail to improve.   Orders:  No orders of the defined types were placed in this encounter.  No orders of the defined types were placed in this encounter.     Procedures: No procedures performed   Clinical Data: No additional findings.   Subjective: Chief Complaint  Patient presents with  . Right Knee - Follow-up  Patient presents today for follow up on his right knee. He had a right total knee arthroplasty on 07/30/2017. He is now almost two years out from surgery. He states that he is doing well. No complaints.  On Social Security disability for a combination of factors including an issue with his balance for which he uses a cane and some neuropathy in his feet.  He is diabetic.  His prior job as a Engineer, maintenance (IT) at [employer] required him to bend  stoop and squat which he is unable to do  HPI  Review of Systems   Objective: Vital Signs: Ht 5' 5.5" (1.664 m)   Wt 280 lb (127 kg)   BMI 45.89 kg/m   Physical Exam Constitutional:      Appearance: He is well-developed.  Eyes:     Pupils: Pupils are equal, round, and reactive to light.  Pulmonary:     Effort: Pulmonary effort is normal.  Skin:    General: Skin is warm and dry.  Neurological:     Mental Status: He is alert and oriented to person, place, and time.  Psychiatric:        Behavior: Behavior normal.     Ortho Exam awake alert and oriented x3.  Comfortable sitting right knee with full quick extension and flexion at to 90 degrees.  No instability.  No localized areas of tenderness.  No effusion.  Wound is healed without a problem.  Does have some tingling in his feet and some altered sensibility consistent with his diabetic neuropathy.  No calf pain.  No pain with range of motion of either hip.  Motor exam intact  Specialty Comments:  No specialty comments available.  Imaging: No results found.   PMFS History:  Patient Active Problem List   Diagnosis Date Noted  . Chronic pain of right knee 10/08/2018  . Chronic right shoulder pain 05/07/2018  . Primary osteoarthritis of right knee 07/30/2017  . History of total right knee replacement 07/30/2017  . Colon cancer screening 10/28/2013  . Umbilical hernia without mention of obstruction or gangrene 10/28/2013   Past Medical History:  Diagnosis Date  . Arthritis    "right knee" (07/30/2017)  . CAP (community acquired pneumonia) 2018  . Hypertension   . Hypertension   . Wears glasses     Family History  Problem Relation Age of Onset  . Colon cancer Neg Hx   . Colon polyps Neg Hx     Past Surgical History:  Procedure Laterality Date  . EYE SURGERY    . JOINT REPLACEMENT    . RETINAL DETACHMENT SURGERY Left 2000s  . TOTAL KNEE ARTHROPLASTY Right 07/30/2017  . TOTAL KNEE ARTHROPLASTY Right 07/30/2017    Procedure: RIGHT TOTAL KNEE ARTHROPLASTY;  Surgeon: [name], MD;  Location: [place];  Service: Orthopedics;  Laterality: Right;  Marland Kitchen VASECTOMY     Social History   Occupational History  . Not on file  Tobacco Use  . Smoking status: Never Smoker  . Smokeless tobacco: Never Used  Substance and Sexual Activity  . Alcohol use: No  . Drug use: No  . Sexual activity: Never

## 2019-06-15 ENCOUNTER — Encounter: Payer: Self-pay | Admitting: *Deleted

## 2019-06-15 ENCOUNTER — Other Ambulatory Visit: Payer: Self-pay

## 2019-06-15 ENCOUNTER — Ambulatory Visit (INDEPENDENT_AMBULATORY_CARE_PROVIDER_SITE_OTHER): Payer: Self-pay | Admitting: *Deleted

## 2019-06-15 DIAGNOSIS — Z86010 Personal history of colonic polyps: Secondary | ICD-10-CM

## 2019-06-15 MED ORDER — NA SULFATE-K SULFATE-MG SULF 17.5-3.13-1.6 GM/177ML PO SOLN: 1.0000 | Freq: Once | ORAL | 0 refills | Status: AC

## 2019-06-15 NOTE — Progress Notes (Signed)
Mailed letter to pt with diabetes medication adjustments.   

## 2019-06-15 NOTE — Progress Notes (Addendum)
Gastroenterology Pre-Procedure Review  Request Date: 06/15/2019 Requesting Physician: Dr. Wende Neighbors, Last TCS 06/04/2008 done by Dr. Oneida Alar, tubular adenoma  PATIENT REVIEW QUESTIONS: The patient responded to the following health history questions as indicated:    1. Diabetes Melitis: yes 2. Joint replacements in the past 12 months: no 3. Major health problems in the past 3 months: no 4. Has an artificial valve or MVP: no 5. Has a defibrillator: no 6. Has been advised in past to take antibiotics in advance of a procedure like teeth cleaning: no 7. Family history of colon cancer: no  8. Alcohol Use: no 9. Illicit drug Use: no 10. History of sleep apnea: no  11. History of coronary artery or other vascular stents placed within the last 12 months: no 12. History of any prior anesthesia complications: no 13. There is no height or weight on file to calculate BMI.ht: 5'6 wt: 240 lbs    MEDICATIONS & ALLERGIES:    Patient reports the following regarding taking any blood thinners:   Plavix? no Aspirin? no Coumadin? no Brilinta? no Xarelto? no Eliquis? no Pradaxa? no Savaysa? no Effient? no  Patient confirms/reports the following medications:  Current Outpatient Medications  Medication Sig Dispense Refill  . acetaminophen (TYLENOL) 325 MG tablet Take 2 tablets (650 mg total) by mouth every 4 (four) hours as needed. (Patient taking differently: Take 650 mg by mouth as needed. )    . losartan-hydrochlorothiazide (HYZAAR) 50-12.5 MG tablet Take 1 tablet by mouth daily.    . metFORMIN (GLUCOPHAGE) 500 MG tablet Take 500 mg by mouth daily.      No current facility-administered medications for this visit.    Patient confirms/reports the following allergies:  Allergies  Allergen Reactions  . Flexeril [Cyclobenzaprine] Shortness Of Breath    Can not take any muscle relaxer causes respirator depression   . Robaxin [Methocarbamol] Shortness Of Breath    Can not take any muscle relaxer  causes respirator depression    No orders of the defined types were placed in this encounter.   AUTHORIZATION INFORMATION Primary Insurance: Medicare, ID# [id number] Pre-Cert/Auth required: No, not required  Secondary Insurance:Medicaid [place] Access,  ID #: [id number] Pre-Cert / Josem Kaufmann required: No, not required  SCHEDULE INFORMATION: Procedure has been scheduled as follows:  Date: 10/15/2019, Time: 9:30 Location: [facility] with Dr. Oneida Alar  This Gastroenterology Pre-Precedure Review Form is being routed to the following provider(s): Aliene Altes, PA

## 2019-06-15 NOTE — Patient Instructions (Signed)
[name]  03-03-1956 MRN: [id number]     Procedure Date: 10/15/2019 Time to register: 8:30 am Place to register: [facility] Procedure Time: 9:30 am Scheduled provider: Dr. Oneida Alar    PREPARATION FOR COLONOSCOPY WITH SUPREP BOWEL PREP KIT  Note: Suprep Bowel Prep Kit is a split-dose (2day) regimen. Consumption of BOTH 6-ounce bottles is required for a complete prep.  Please notify us immediately if you are diabetic, take iron supplements, or if you are on Coumadin or any other blood thinners.  Please hold the following medications: See letter                                                                                                                                                 1 DAY BEFORE PROCEDURE:  DATE: 10/14/2019   DAY: Wednesday Continue clear liquids the entire day - NO SOLID FOOD.   Diabetic medications adjustments for today: See letter  At 6:00pm: Complete steps 1 through 4 below, using ONE (1) 6-ounce bottle, before going to bed. Step 1:  Pour ONE (1) 6-ounce bottle of SUPREP liquid into the mixing container.  Step 2:  Add cool drinking water to the 16 ounce line on the container and mix.  Note: Dilute the solution concentrate as directed prior to use. Step 3:  DRINK ALL the liquid in the container. Step 4:  You MUST drink an additional two (2) or more 16 ounce containers of water over the next one (1) hour.   Continue clear liquids.  DAY OF PROCEDURE:   DATE: 10/15/2019   DAY: Thursday If you take medications for your heart, blood pressure, or breathing, you may take these medications.  Diabetic medications adjustments for today: See letter  5 hours before your procedure at :  4:30 am Step 1:  Pour ONE (1) 6-ounce bottle of SUPREP liquid into the mixing container.  Step 2:  Add cool drinking water to the 16 ounce line on the container and mix.  Note: Dilute the solution concentrate as directed prior to use. Step 3:  DRINK ALL the liquid in  the container. Step 4:  You MUST drink an additional two (2) or more 16 ounce containers of water over the next one (1) hour. You MUST complete the final glass of water at least 3 hours before your colonoscopy. Nothing by mouth past 6:30 am  You may take your morning medications with sip of water unless we have instructed otherwise.    Please see below for Dietary Information.  CLEAR LIQUIDS INCLUDE:  Water Jello (NOT red in color)   Ice Popsicles (NOT red in color)   Tea (sugar ok, no milk/cream) Powdered fruit flavored drinks  Coffee (sugar ok, no milk/cream) Gatorade/ Lemonade/ Kool-Aid  (NOT red in color)   Juice: apple, white grape, white cranberry Soft drinks  Clear bullion,  consomme, broth (fat free beef/chicken/vegetable)  Carbonated beverages (any kind)  Strained chicken noodle soup Hard Candy   Remember: Clear liquids are liquids that will allow you to see your fingers on the other side of a clear glass. Be sure liquids are NOT red in color, and not cloudy, but CLEAR.  DO NOT EAT OR DRINK ANY OF THE FOLLOWING:  Dairy products of any kind   Cranberry juice Tomato juice / V8 juice   Grapefruit juice Orange juice     Red grape juice  Do not eat any solid foods, including such foods as: cereal, oatmeal, yogurt, fruits, vegetables, creamed soups, eggs, bread, crackers, pureed foods in a blender, etc.   HELPFUL HINTS FOR DRINKING PREP SOLUTION:   Make sure prep is extremely cold. Mix and refrigerate the the morning of the prep. You may also put in the freezer.   You may try mixing some Crystal Light or Country Time Lemonade if you prefer. Mix in small amounts; add more if necessary.  Try drinking through a straw  Rinse mouth with water or a mouthwash between glasses, to remove after-taste.  Try sipping on a cold beverage /ice/ popsicles between glasses of prep.  Place a piece of sugar-free hard candy in mouth between glasses.  If you become nauseated, try consuming  smaller amounts, or stretch out the time between glasses. Stop for 30-60 minutes, then slowly start back drinking.     OTHER INSTRUCTIONS  You will need a responsible adult at least 61 years of age to accompany you and drive you home. This person must remain in the waiting room during your procedure. The hospital will cancel your procedure if you do not have a responsible adult with you.   1. Wear loose fitting clothing that is easily removed. 2. Leave jewelry and other valuables at home.  3. Remove all body piercing jewelry and leave at home. 4. Total time from sign-in until discharge is approximately 2-3 hours. 5. You should go home directly after your procedure and rest. You can resume normal activities the day after your procedure. 6. The day of your procedure you should not:  Drive  Make legal decisions  Operate machinery  Drink alcohol  Return to work   You may call the office (Dept: [phone]) before 5:00pm, or page the doctor on call [phone]) after 5:00pm, for further instructions, if necessary.   Insurance Information YOU WILL NEED TO CHECK WITH YOUR INSURANCE COMPANY FOR THE BENEFITS OF COVERAGE YOU HAVE FOR THIS PROCEDURE.  UNFORTUNATELY, NOT ALL INSURANCE COMPANIES HAVE BENEFITS TO COVER ALL OR PART OF THESE TYPES OF PROCEDURES.  IT IS YOUR RESPONSIBILITY TO CHECK YOUR BENEFITS, HOWEVER, WE WILL BE GLAD TO ASSIST YOU WITH ANY CODES YOUR INSURANCE COMPANY MAY NEED.    PLEASE NOTE THAT MOST INSURANCE COMPANIES WILL NOT COVER A SCREENING COLONOSCOPY FOR PEOPLE UNDER THE AGE OF 50  IF YOU HAVE BCBS INSURANCE, YOU MAY HAVE BENEFITS FOR A SCREENING COLONOSCOPY BUT IF POLYPS ARE FOUND THE DIAGNOSIS WILL CHANGE AND THEN YOU MAY HAVE A DEDUCTIBLE THAT WILL NEED TO BE MET. SO PLEASE MAKE SURE YOU CHECK YOUR BENEFITS FOR A SCREENING COLONOSCOPY AS WELL AS A DIAGNOSTIC COLONOSCOPY.

## 2019-06-15 NOTE — Progress Notes (Signed)
Ok to schedule.   Diabetes medication adjustments: 1 day prior: 1/2 dose metformin (250 mg) Day of: Hold metformin the morning of his procedure.

## 2019-06-15 NOTE — Addendum Note (Signed)
Addended by: Metro Kung on: 06/15/2019 02:12 PM   Modules accepted: Orders, SmartSet

## 2019-07-06 ENCOUNTER — Other Ambulatory Visit: Payer: Self-pay

## 2019-07-06 ENCOUNTER — Ambulatory Visit (HOSPITAL_COMMUNITY)
Admission: RE | Admit: 2019-07-06 | Discharge: 2019-07-06 | Disposition: A | Discharge status: Home / Self Care | Payer: Medicaid Other | Source: Ambulatory Visit | Attending: Internal Medicine | Admitting: Internal Medicine

## 2019-07-06 ENCOUNTER — Other Ambulatory Visit (HOSPITAL_COMMUNITY): Payer: Self-pay | Admitting: Internal Medicine

## 2019-07-06 DIAGNOSIS — M79672 Pain in left foot: Secondary | ICD-10-CM | POA: Diagnosis present

## 2019-07-06 DIAGNOSIS — M25572 Pain in left ankle and joints of left foot: Secondary | ICD-10-CM

## 2019-07-06 IMAGING — DX DG ANKLE COMPLETE 3+V*L*
3 series · 3 of 3 positions shown · non-contrast
Comparison: None.

CLINICAL DATA: Ankle pain

EXAM:
LEFT ANKLE COMPLETE - 3+ VIEW

[ankle ap]
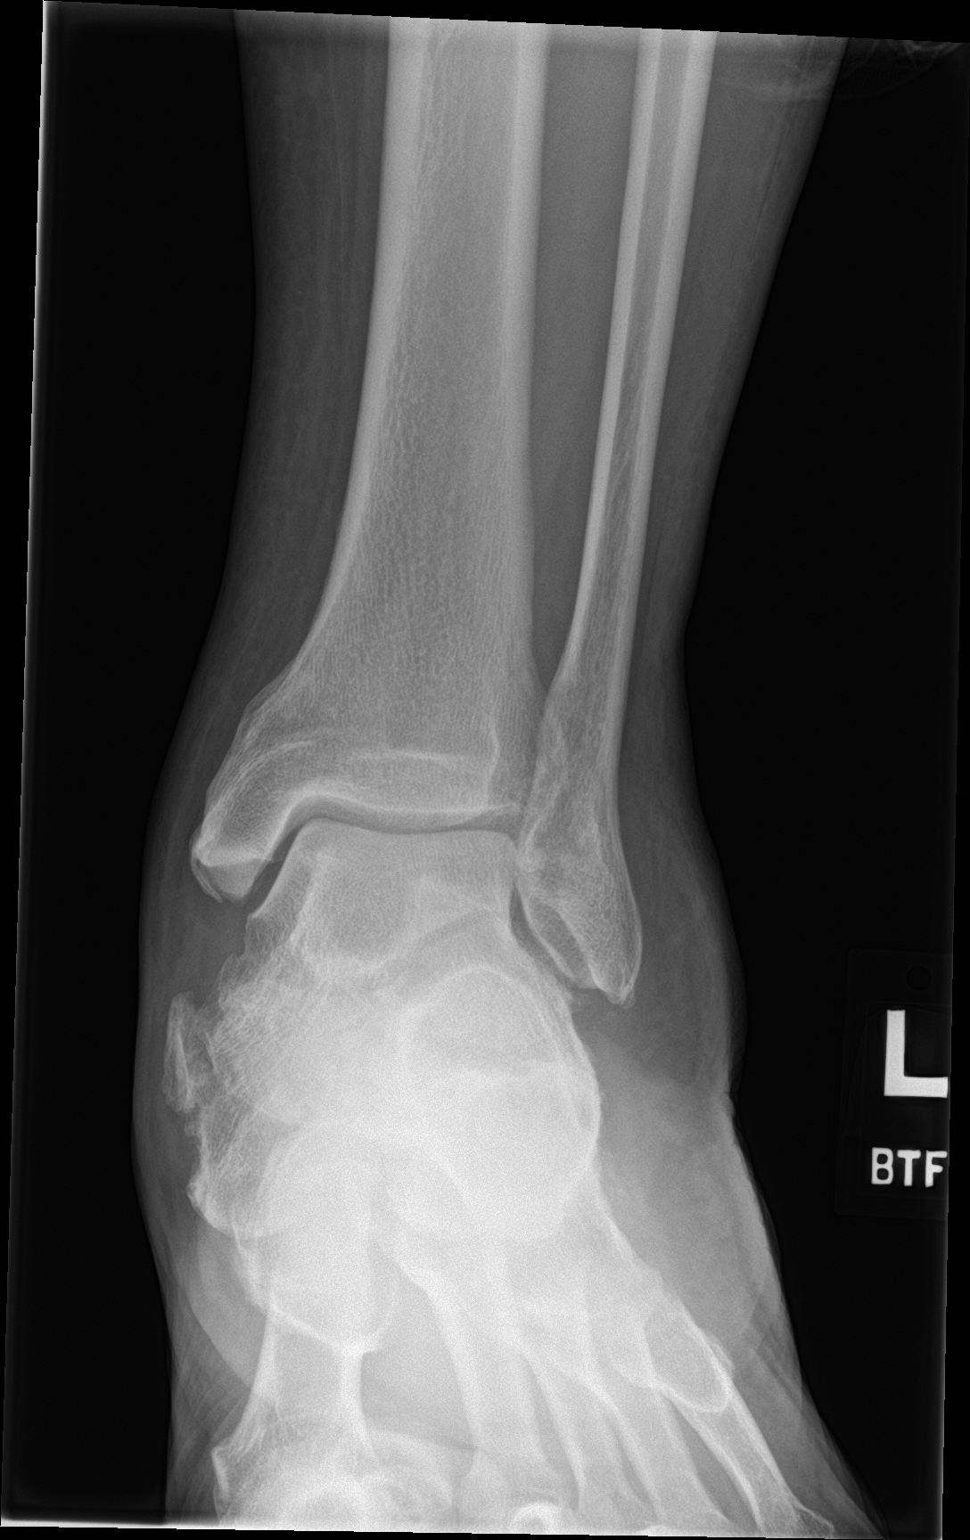

[ankle obl]
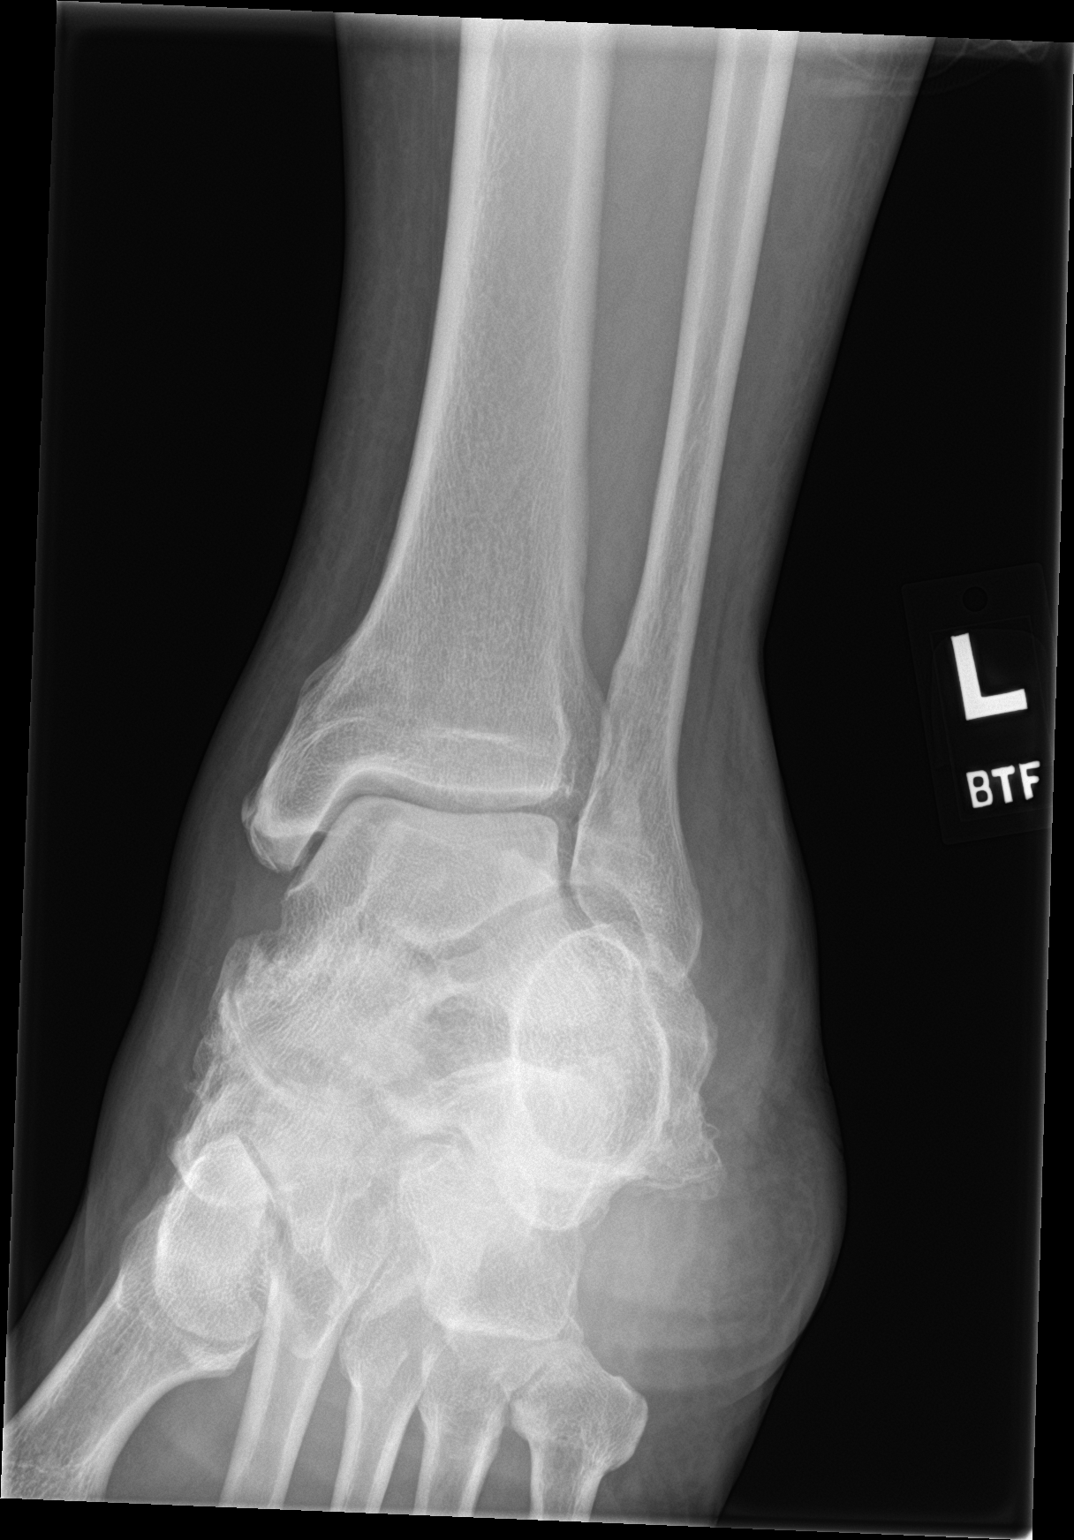

[ankle lat]
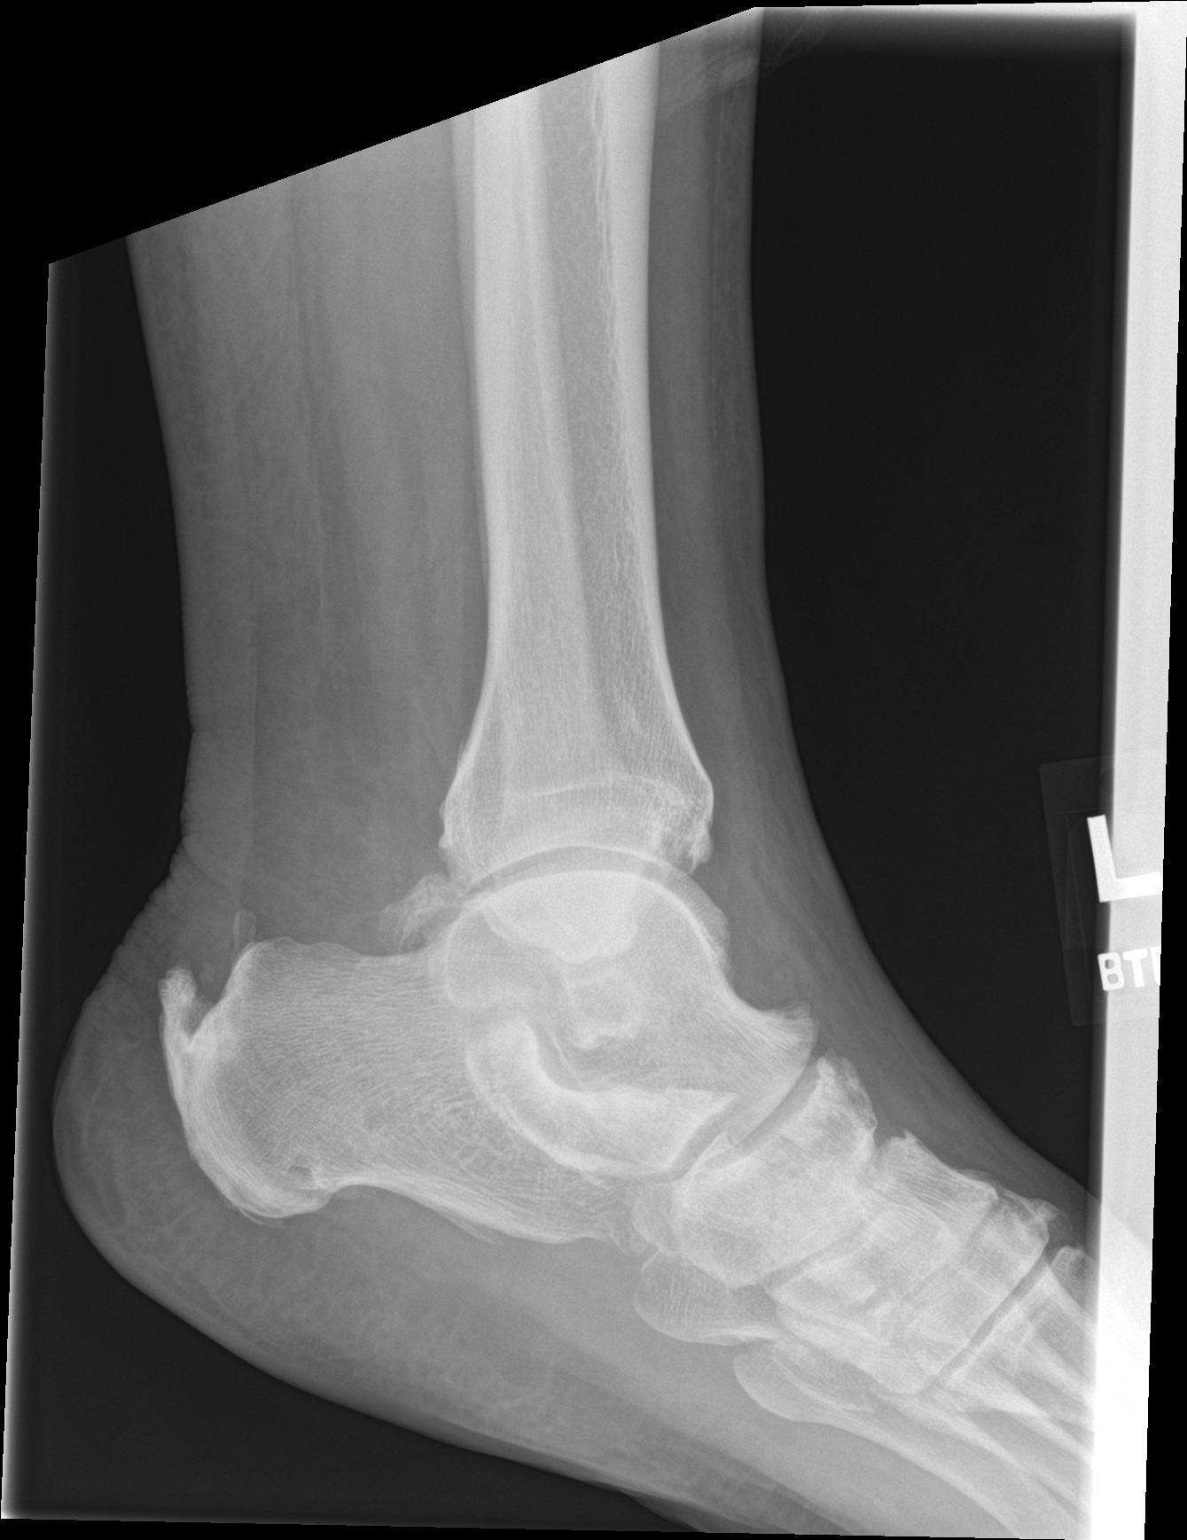

[3 of 3 positions shown; findings below may reference images not displayed]

FINDINGS: There are degenerative changes of the mortise joint. There is soft
tissue swelling about the lateral malleolus. There is no acute
displaced fracture or dislocation. There is some fragmentation
involving the medial calcaneus which is likely related to an old
remote injury. Achilles tendon enthesophyte is noted. There are some
degenerative changes of the midfoot.
IMPRESSION: 1. No acute displaced fracture or dislocation.
2. Degenerative changes as above.

## 2019-07-06 IMAGING — DX DG FOOT COMPLETE 3+V*L*
3 series · 3 of 3 positions shown · non-contrast
Comparison: None.

CLINICAL DATA: Left foot and ankle pain following right knee
replacement.

EXAM:
LEFT FOOT - COMPLETE 3+ VIEW

[foot ap]
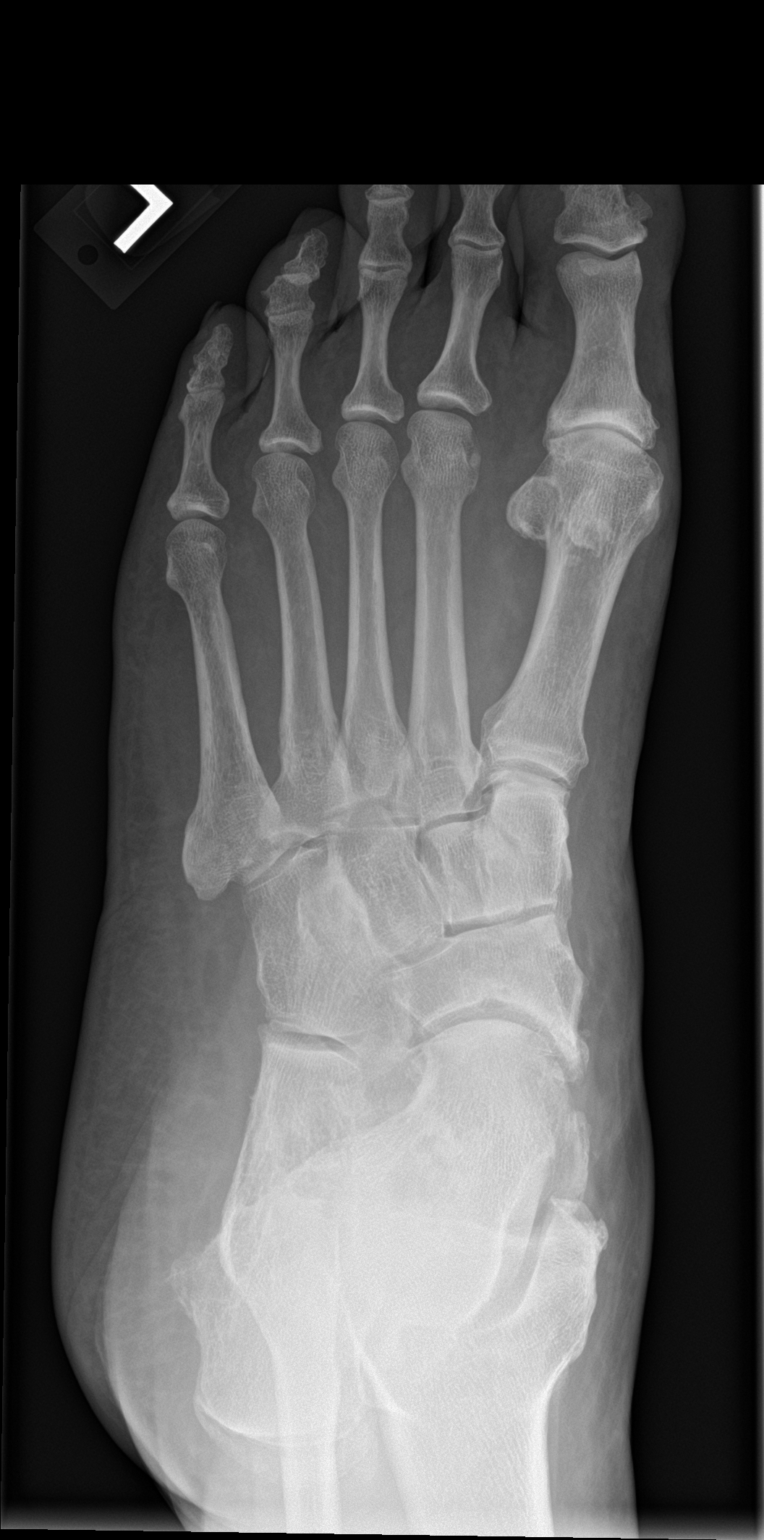

[foot obl]
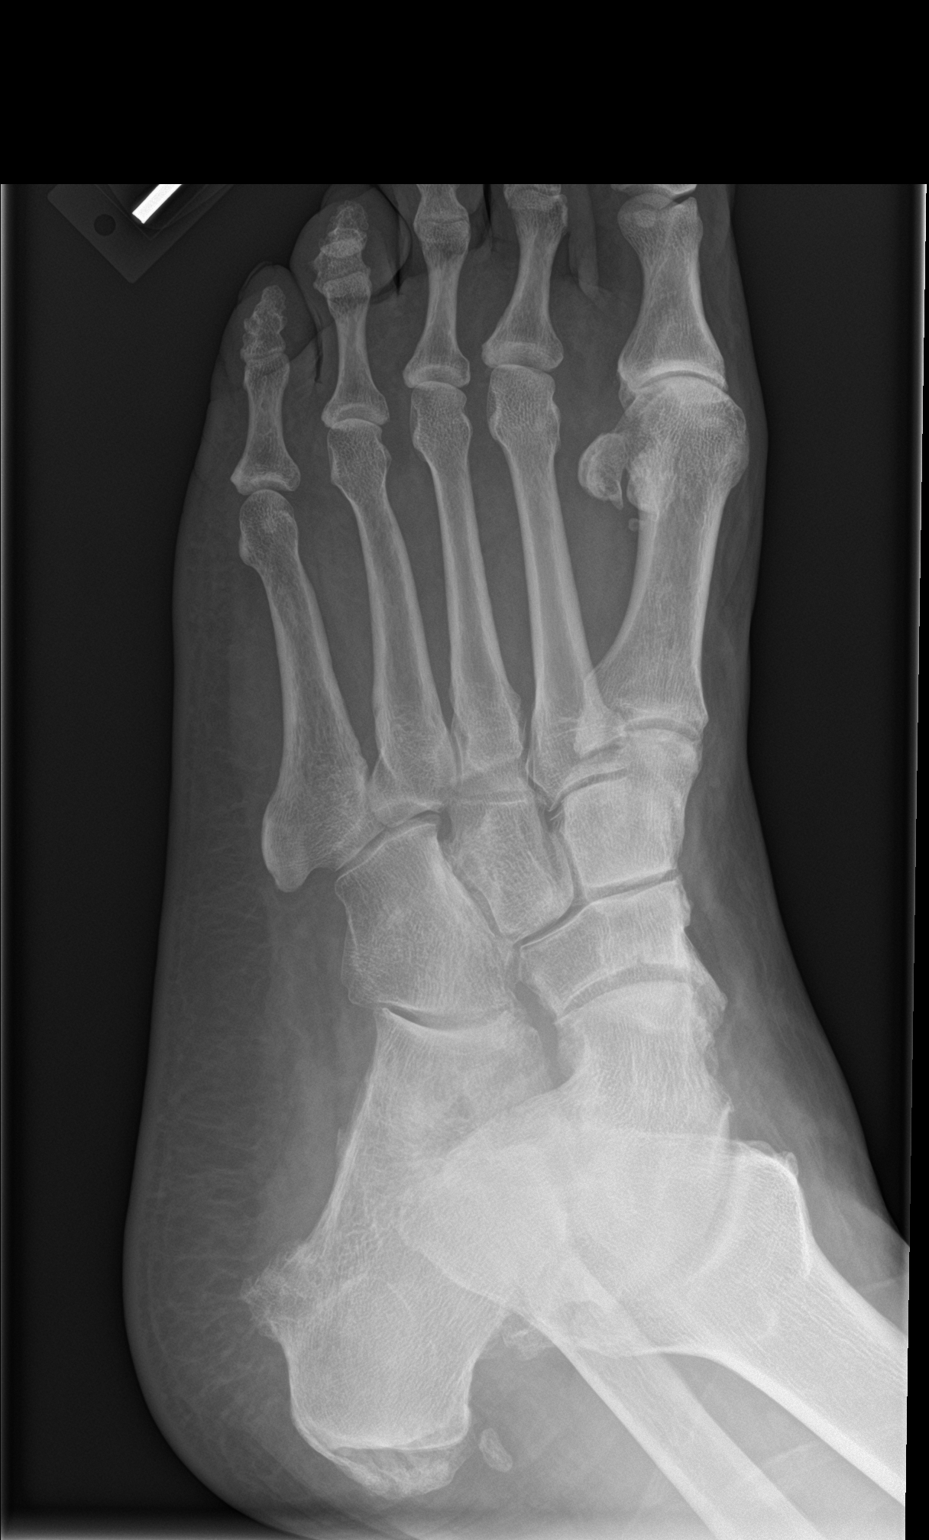

[foot lat]
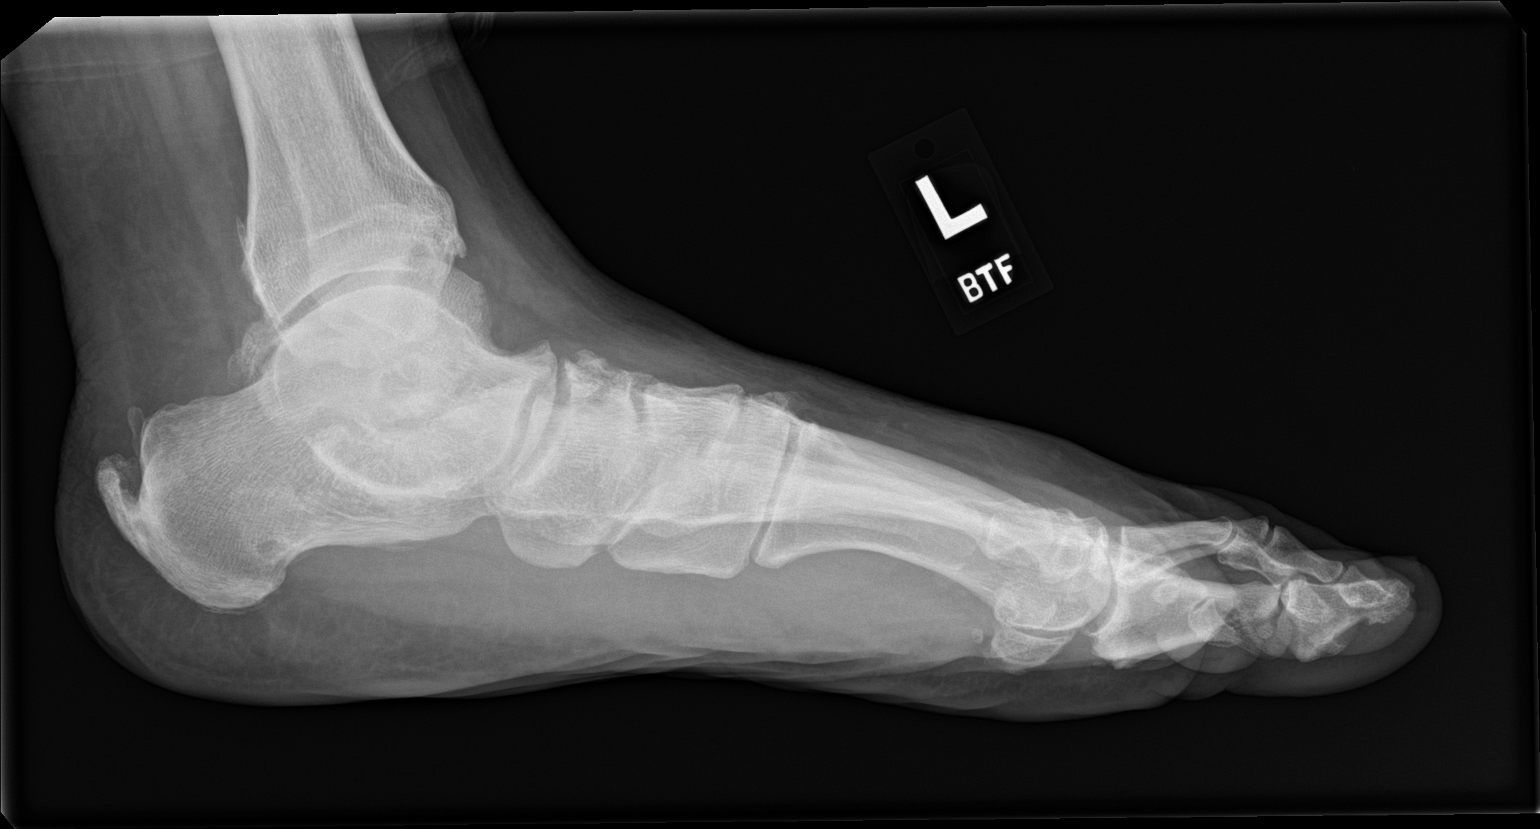

[3 of 3 positions shown; findings below may reference images not displayed]

FINDINGS: There is flattening of the arch. No acute or healing fractures are
present. Degenerative changes are present at the ankle and hindfoot.
IMPRESSION: 1. No acute or healing fractures.
2. Chronic degenerative changes of the ankle and hindfoot.

## 2019-09-19 ENCOUNTER — Other Ambulatory Visit: Payer: Self-pay

## 2019-09-19 ENCOUNTER — Encounter (HOSPITAL_COMMUNITY): Payer: Self-pay | Admitting: *Deleted

## 2019-09-19 ENCOUNTER — Emergency Department (HOSPITAL_COMMUNITY)
Admission: EM | Admit: 2019-09-19 | Discharge: 2019-09-19 | Disposition: A | Discharge status: Home / Self Care | Payer: Medicaid Other | Attending: Emergency Medicine | Admitting: Emergency Medicine

## 2019-09-19 ENCOUNTER — Emergency Department (HOSPITAL_COMMUNITY): Payer: Medicaid Other

## 2019-09-19 DIAGNOSIS — R0789 Other chest pain: Secondary | ICD-10-CM | POA: Insufficient documentation

## 2019-09-19 DIAGNOSIS — I10 Essential (primary) hypertension: Secondary | ICD-10-CM | POA: Diagnosis not present

## 2019-09-19 IMAGING — DX DG CHEST 2V
2 series · 2 of 2 positions shown · non-contrast
Comparison: [DATE]

CLINICAL DATA: Chest pain.

EXAM:
CHEST - 2 VIEW

[chest pa]
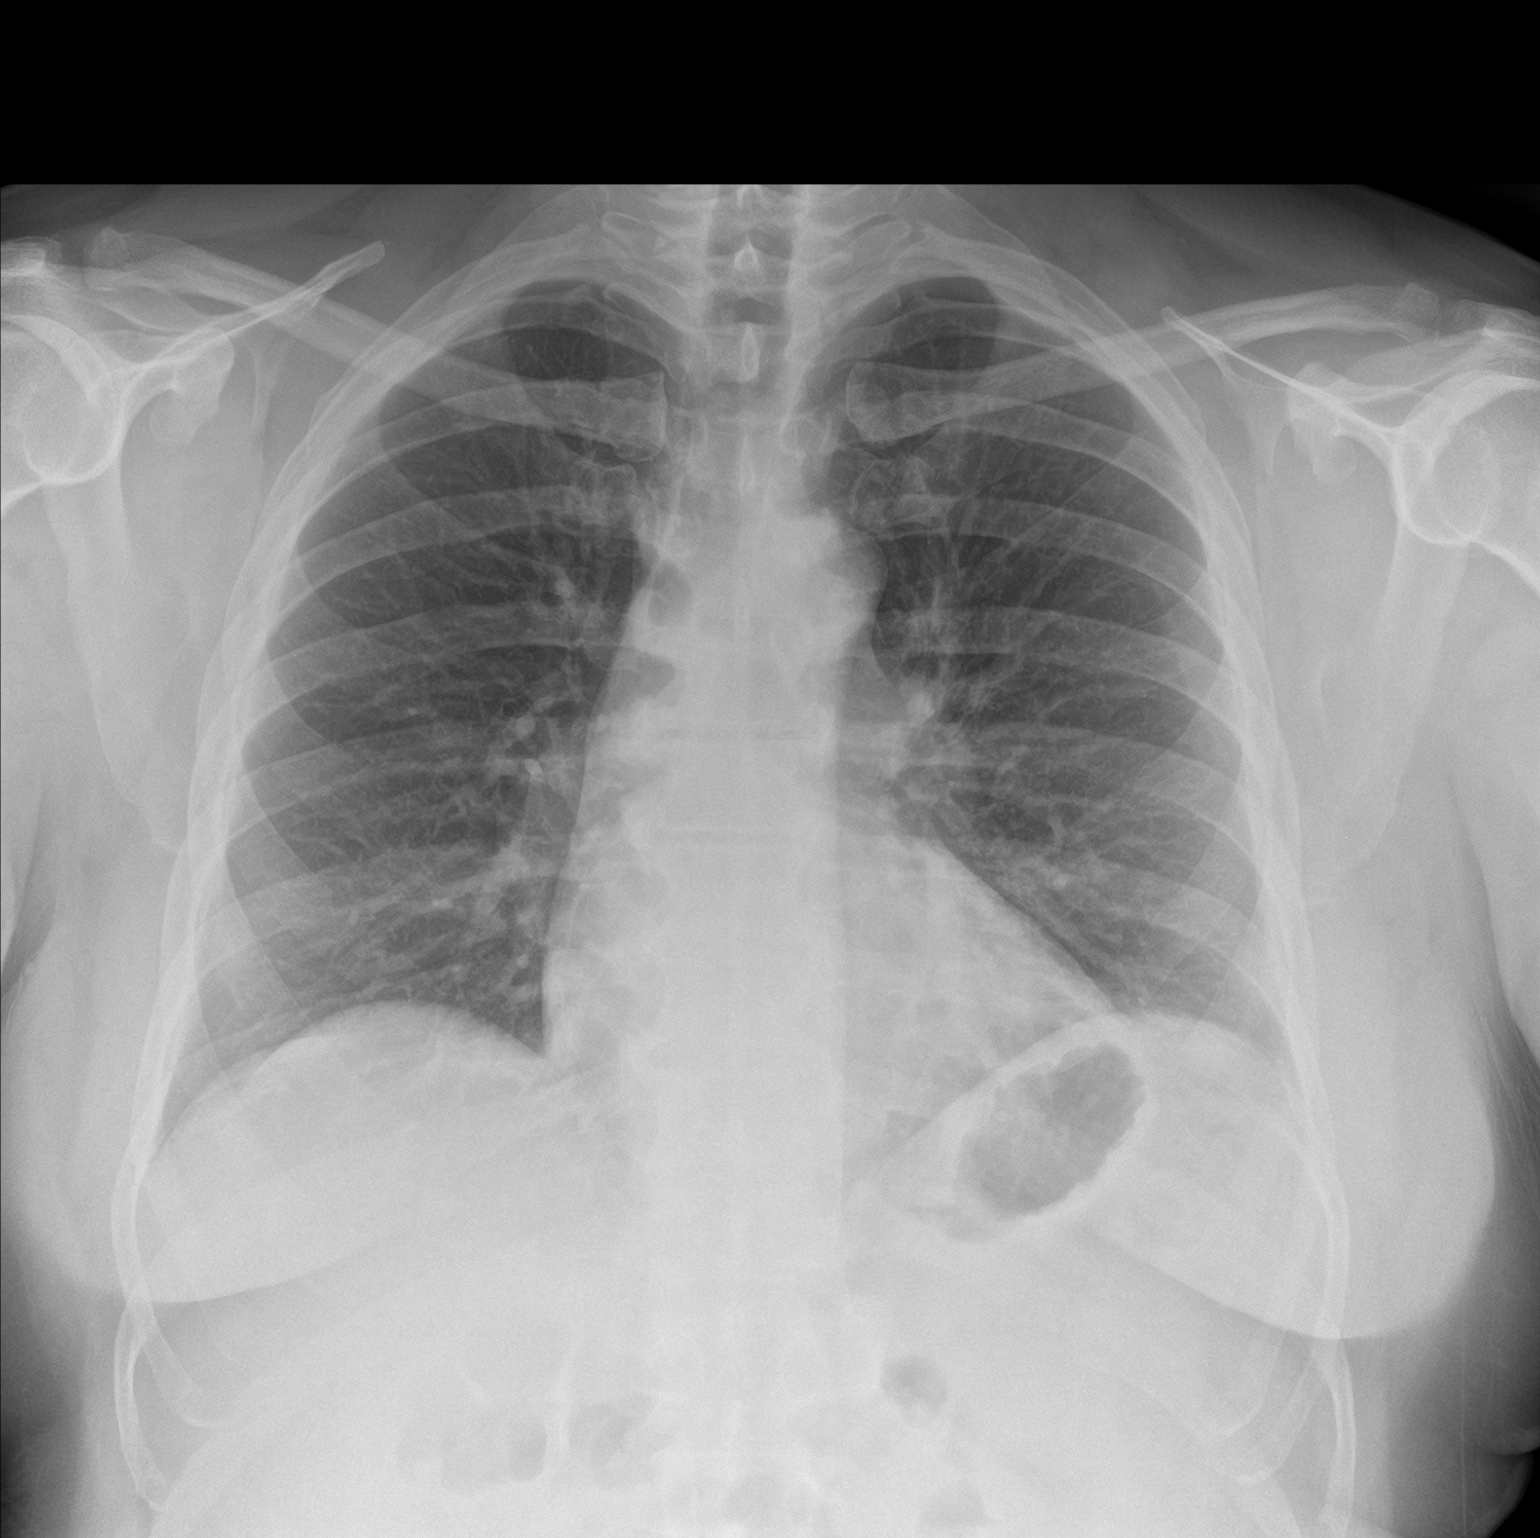

[chest lat]
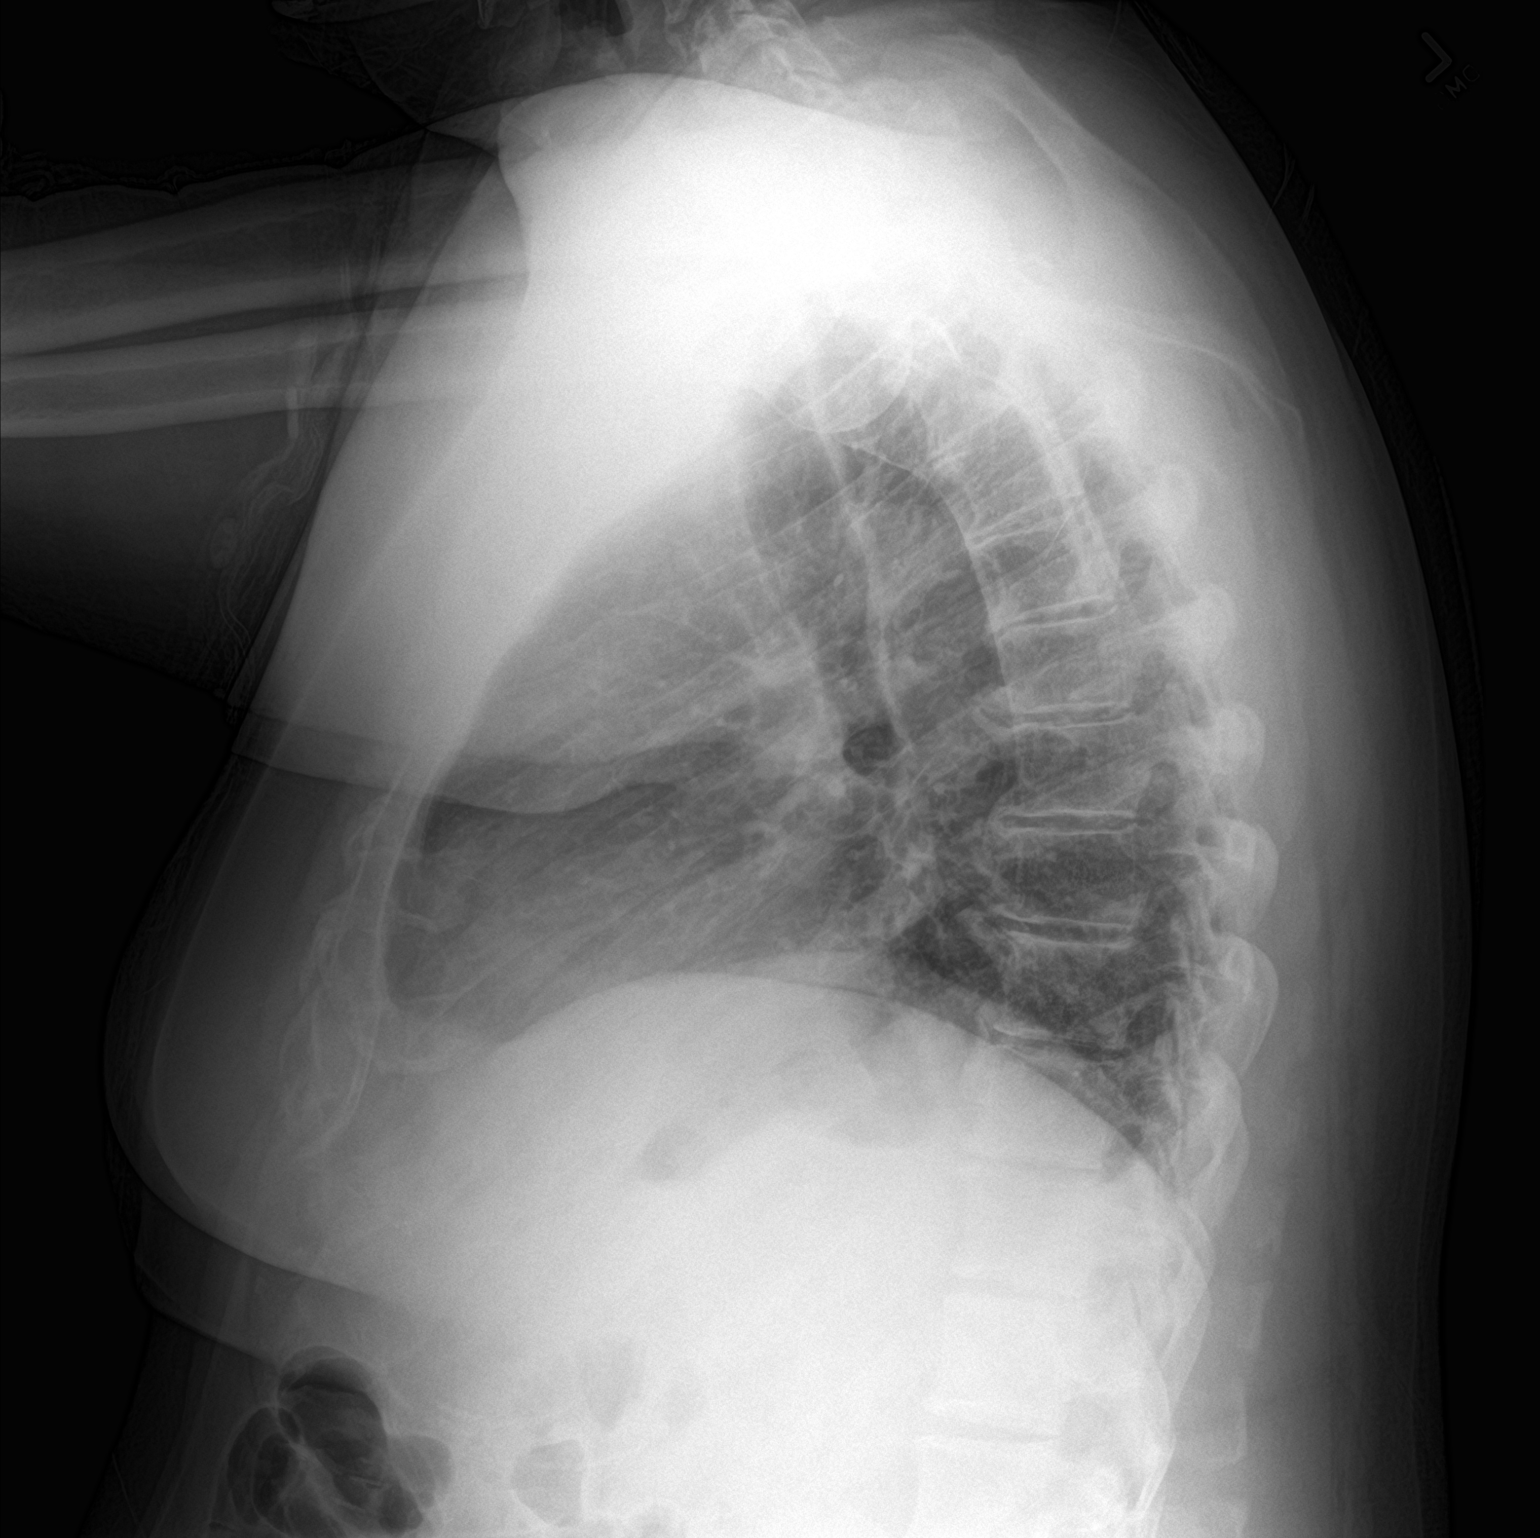

[2 of 2 positions shown; findings below may reference images not displayed]

FINDINGS: The heart size and mediastinal contours are within normal limits.
Both lungs are clear. The visualized skeletal structures are
unremarkable.
IMPRESSION: No active cardiopulmonary disease.

## 2019-09-19 MED ORDER — OXYCODONE-ACETAMINOPHEN 5-325 MG PO TABS: 1.0000 | ORAL_TABLET | Freq: Four times a day (QID) | ORAL | 0 refills | Status: DC | PRN

## 2019-09-19 MED ORDER — HYDROMORPHONE HCL 1 MG/ML IJ SOLN
1.0000 mg | Freq: Once | INTRAMUSCULAR | Status: AC
  Administered 2019-09-19: 1 mg via INTRAMUSCULAR
  Filled 2019-09-19: qty 1

## 2019-09-19 NOTE — ED Triage Notes (Signed)
Pt c/o mid center chest pain that is worse with movement, pt states that he was getting into his son car this am and felt like he pulled a muscle in his chest area.

## 2019-09-19 NOTE — Discharge Instructions (Addendum)
Follow-up with your family doctor in a week if not improving

## 2019-09-22 NOTE — ED Provider Notes (Signed)
[facility] EMERGENCY DEPARTMENT Provider Note   CSN: [id number] Arrival date & time: 09/19/19  1946     History Chief Complaint  Patient presents with  . Chest Pain    [name] is a 64 y.o. male.  Patient complains of left chest pain after lifting something heavy.  Pain is worse with movement  The history is provided by the patient. No language interpreter was used.  Chest Pain Pain location:  L chest Pain quality: aching   Pain radiates to:  Does not radiate Pain severity:  Moderate Onset quality:  Sudden Timing:  Constant Progression:  Worsening Chronicity:  New Context: not breathing   Relieved by:  Nothing Worsened by:  Movement Ineffective treatments:  None tried Associated symptoms: no abdominal pain, no back pain, no cough, no fatigue and no headache        Past Medical History:  Diagnosis Date  . Arthritis    "right knee" (07/30/2017)  . CAP (community acquired pneumonia) 2018  . Hypertension   . Hypertension   . Wears glasses     Patient Active Problem List   Diagnosis Date Noted  . Chronic pain of right knee 10/08/2018  . Chronic right shoulder pain 05/07/2018  . Primary osteoarthritis of right knee 07/30/2017  . History of total right knee replacement 07/30/2017  . Colon cancer screening 10/28/2013  . Umbilical hernia without mention of obstruction or gangrene 10/28/2013    Past Surgical History:  Procedure Laterality Date  . EYE SURGERY    . JOINT REPLACEMENT    . RETINAL DETACHMENT SURGERY Left 2000s  . TOTAL KNEE ARTHROPLASTY Right 07/30/2017  . TOTAL KNEE ARTHROPLASTY Right 07/30/2017   Procedure: RIGHT TOTAL KNEE ARTHROPLASTY;  Surgeon: [name], MD;  Location: [place];  Service: Orthopedics;  Laterality: Right;  Marland Kitchen VASECTOMY         Family History  Problem Relation Age of Onset  . Colon cancer Neg Hx   . Colon polyps Neg Hx     Social History   Tobacco Use  . Smoking status: Never Smoker  . Smokeless  tobacco: Never Used  Substance Use Topics  . Alcohol use: No  . Drug use: No    Home Medications Prior to Admission medications   Medication Sig Start Date End Date Taking? Authorizing Provider  acetaminophen (TYLENOL) 325 MG tablet Take 2 tablets (650 mg total) by mouth every 4 (four) hours as needed. Patient taking differently: Take 650 mg by mouth as needed.  08/01/17   Cherylann Ratel, PA-C  losartan-hydrochlorothiazide (HYZAAR) 50-12.5 MG tablet Take 1 tablet by mouth daily.    [provider]  metFORMIN (GLUCOPHAGE) 500 MG tablet Take 500 mg by mouth daily.  05/19/19   [provider]  oxyCODONE-acetaminophen (PERCOCET/ROXICET) 5-325 MG tablet Take 1 tablet by mouth every 6 (six) hours as needed. 09/19/19   Milton Ferguson, MD    Allergies    Flexeril [cyclobenzaprine] and Robaxin [methocarbamol]  Review of Systems   Review of Systems  Constitutional: Negative for appetite change and fatigue.  HENT: Negative for congestion, ear discharge and sinus pressure.   Eyes: Negative for discharge.  Respiratory: Negative for cough.   Cardiovascular: Positive for chest pain.  Gastrointestinal: Negative for abdominal pain and diarrhea.  Genitourinary: Negative for frequency and hematuria.  Musculoskeletal: Negative for back pain.  Skin: Negative for rash.  Neurological: Negative for seizures and headaches.  Psychiatric/Behavioral: Negative for hallucinations.    Physical Exam Updated  Vital Signs BP 131/74 (BP Location: Right Arm)   Pulse 67   Temp 98.7 F (37.1 C) (Oral)   Resp 18   Ht 5\' 6"  (1.676 m)   Wt 117.3 kg   SpO2 95%   BMI 41.76 kg/m   Physical Exam Vitals and nursing note reviewed.  Constitutional:      Appearance: He is well-developed.  HENT:     Head: Normocephalic.     Nose: Nose normal.  Eyes:     General: No scleral icterus.    Conjunctiva/sclera: Conjunctivae normal.  Neck:     Thyroid: No thyromegaly.  Cardiovascular:     Rate  and Rhythm: Normal rate and regular rhythm.     Heart sounds: No murmur. No friction rub. No gallop.   Pulmonary:     Breath sounds: No stridor. No wheezing or rales.  Chest:     Chest wall: No tenderness.  Abdominal:     General: There is no distension.     Tenderness: There is no abdominal tenderness. There is no rebound.  Musculoskeletal:        General: Normal range of motion.     Cervical back: Neck supple.     Comments: Tender pectoralis muscle on the left.  Worse with movement  Lymphadenopathy:     Cervical: No cervical adenopathy.  Skin:    Findings: No erythema or rash.  Neurological:     Mental Status: He is oriented to person, place, and time.     Motor: No abnormal muscle tone.     Coordination: Coordination normal.  Psychiatric:        Behavior: Behavior normal.     ED Results / Procedures / Treatments   Labs (all labs ordered are listed, but only abnormal results are displayed) Labs Reviewed - No data to display  EKG EKG Interpretation  Date/Time:  Saturday September 19 2019 20:28:25 EDT Ventricular Rate:  67 PR Interval:  128 QRS Duration: 100 QT Interval:  412 QTC Calculation: 435 R Axis:   38 Text Interpretation: Normal sinus rhythm Incomplete right bundle branch block Nonspecific ST abnormality Abnormal ECG Confirmed by Milton Ferguson [phone]) on 09/19/2019 9:40:33 PM   Radiology No results found.  Procedures Procedures (including critical care time)  Medications Ordered in ED Medications  HYDROmorphone (DILAUDID) injection 1 mg (1 mg Intramuscular Given 09/19/19 2158)    ED Course  I have reviewed the triage vital signs and the nursing notes.  Pertinent labs & imaging results that were available during my care of the patient were reviewed by me and considered in my medical decision making (see chart for details).    MDM Rules/Calculators/A&P                      Suspect chest discomfort is related to muscle inflammation.  Labs chest x-ray  EKG unremarkable.  Patient given pain medicine and will follow up with PCP Final Clinical Impression(s) / ED Diagnoses Final diagnoses:  Chest wall pain    Rx / DC Orders ED Discharge Orders         Ordered    oxyCODONE-acetaminophen (PERCOCET/ROXICET) 5-325 MG tablet  Every 6 hours PRN     09/19/19 2232           Milton Ferguson, MD 09/22/19 1046

## 2019-10-07 ENCOUNTER — Ambulatory Visit: Payer: Self-pay

## 2019-10-07 ENCOUNTER — Other Ambulatory Visit: Payer: Self-pay

## 2019-10-07 ENCOUNTER — Encounter: Payer: Self-pay | Admitting: Orthopaedic Surgery

## 2019-10-07 ENCOUNTER — Ambulatory Visit (INDEPENDENT_AMBULATORY_CARE_PROVIDER_SITE_OTHER): Payer: Medicaid Other | Admitting: Orthopaedic Surgery

## 2019-10-07 VITALS — Ht 66.0 in

## 2019-10-07 DIAGNOSIS — G8929 Other chronic pain: Secondary | ICD-10-CM | POA: Diagnosis not present

## 2019-10-07 DIAGNOSIS — M1712 Unilateral primary osteoarthritis, left knee: Secondary | ICD-10-CM | POA: Diagnosis not present

## 2019-10-07 DIAGNOSIS — M25562 Pain in left knee: Secondary | ICD-10-CM | POA: Diagnosis not present

## 2019-10-07 MED ORDER — METHYLPREDNISOLONE ACETATE 40 MG/ML IJ SUSP
80.0000 mg | INTRAMUSCULAR | Status: AC | PRN
  Administered 2019-10-07: 15:00:00 80 mg via INTRA_ARTICULAR

## 2019-10-07 MED ORDER — LIDOCAINE HCL 1 % IJ SOLN
2.0000 mL | INTRAMUSCULAR | Status: AC | PRN
  Administered 2019-10-07: 2 mL

## 2019-10-07 MED ORDER — BUPIVACAINE HCL 0.5 % IJ SOLN
2.0000 mL | INTRAMUSCULAR | Status: AC | PRN
  Administered 2019-10-07: 2 mL via INTRA_ARTICULAR

## 2019-10-07 NOTE — Progress Notes (Signed)
Office Visit Note   Patient: [name]           Date of Birth: Nov 03, 1955           MRN: [id number] Visit Date: 10/07/2019              Requested by: Celene Squibb, MD [place],  [place] [address] PCP: Celene Squibb, MD   Assessment & Plan: Visit Diagnoses:  1. Chronic pain of left knee   2. Unilateral primary osteoarthritis, left knee   3. Morbid (severe) obesity due to excess calories ([place])     Plan: Moderate osteoarthritis left knee.  Prior successful right total knee replacement.  Long discussion regarding treatment options.  We will try cortisone injection and monitor response.  Might be a good candidate for viscosupplementation.  Also discussed his weight and need for weight loss.  Follow-Up Instructions: Return if symptoms worsen or fail to improve.   Orders:  Orders Placed This Encounter  Procedures  . Large Joint Inj: L knee  . XR KNEE 3 VIEW LEFT   No orders of the defined types were placed in this encounter.     Procedures: Large Joint Inj: L knee on 10/07/2019 3:06 PM Indications: pain and diagnostic evaluation Details: 25 G 1.5 in needle, anteromedial approach  Arthrogram: No  Medications: 2 mL lidocaine 1 %; 2 mL bupivacaine 0.5 %; 80 mg methylPREDNISolone acetate 40 MG/ML Procedure, treatment alternatives, risks and benefits explained, specific risks discussed. Consent was given by the patient. Patient was prepped and draped in the usual sterile fashion.       Clinical Data: No additional findings.   Subjective: Chief Complaint  Patient presents with  . Left Knee - Pain  Patient presents today for left knee pain. He has been having pain on and off for years. He states that it seems to be worse now. He points to the anterior area as the area of pain. No grinding, giving way, swelling, or catching. He said that rest helps with his knee pain. He has taken Tylenol if needed. He has a history of a right total knee arthroplasty two  years ago with Dr.Saafir Abdullah. He is diabetic.  [name] lost his wife to congestive heart failure several months ago HPI  Review of Systems   Objective: Vital Signs: Ht 5\' 6"  (1.676 m)   BMI 41.76 kg/m   Physical Exam Constitutional:      Appearance: He is well-developed.  Eyes:     Pupils: Pupils are equal, round, and reactive to light.  Pulmonary:     Effort: Pulmonary effort is normal.  Skin:    General: Skin is warm and dry.  Neurological:     Mental Status: He is alert and oriented to person, place, and time.  Psychiatric:        Behavior: Behavior normal.     Ortho Exam awake alert and oriented x3.  Comfortable sitting.  Accompanied by his son.  Left knee was not hot red warm or swollen.  No effusion.  Slight varus with weightbearing.  Predominant medial joint pain.  No instability.  No popliteal pain or mass.  No calf pain.  Motor exam intact.  Painless range of motion both hips  Specialty Comments:  No specialty comments available.  Imaging: XR KNEE 3 VIEW LEFT  Result Date: 10/07/2019 Films of the left knee obtained in 3 projections standing.  There is mild decrease in the medial joint space  with some subchondral sclerosis of the femur.  No ectopic calcification.  Degenerative changes about the patellofemoral joint with minimal joint space narrowing but peripheral osteophytes.  Films are consistent with moderate osteoarthritis predominately of the medial and patellofemoral joints    PMFS History: Patient Active Problem List   Diagnosis Date Noted  . Unilateral primary osteoarthritis, left knee 10/07/2019  . Morbid (severe) obesity due to excess calories ([place]) 10/07/2019  . Chronic pain of right knee 10/08/2018  . Chronic right shoulder pain 05/07/2018  . Primary osteoarthritis of right knee 07/30/2017  . History of total right knee replacement 07/30/2017  . Colon cancer screening 10/28/2013  . Umbilical hernia without mention of obstruction or gangrene  10/28/2013   Past Medical History:  Diagnosis Date  . Arthritis    "right knee" (07/30/2017)  . CAP (community acquired pneumonia) 2018  . Hypertension   . Hypertension   . Wears glasses     Family History  Problem Relation Age of Onset  . Colon cancer Neg Hx   . Colon polyps Neg Hx     Past Surgical History:  Procedure Laterality Date  . EYE SURGERY    . JOINT REPLACEMENT    . RETINAL DETACHMENT SURGERY Left 2000s  . TOTAL KNEE ARTHROPLASTY Right 07/30/2017  . TOTAL KNEE ARTHROPLASTY Right 07/30/2017   Procedure: RIGHT TOTAL KNEE ARTHROPLASTY;  Surgeon: [name], MD;  Location: [place];  Service: Orthopedics;  Laterality: Right;  Marland Kitchen VASECTOMY     Social History   Occupational History  . Not on file  Tobacco Use  . Smoking status: Never Smoker  . Smokeless tobacco: Never Used  Substance and Sexual Activity  . Alcohol use: No  . Drug use: No  . Sexual activity: Never

## 2019-10-13 ENCOUNTER — Other Ambulatory Visit (HOSPITAL_COMMUNITY)
Admission: RE | Admit: 2019-10-13 | Discharge: 2019-10-13 | Disposition: A | Discharge status: Home / Self Care | Payer: Medicaid Other | Source: Ambulatory Visit | Attending: Gastroenterology | Admitting: Gastroenterology

## 2019-10-13 ENCOUNTER — Other Ambulatory Visit: Payer: Self-pay

## 2019-10-14 ENCOUNTER — Telehealth: Payer: Self-pay | Admitting: *Deleted

## 2019-10-14 ENCOUNTER — Encounter: Payer: Self-pay | Admitting: *Deleted

## 2019-10-14 NOTE — Telephone Encounter (Signed)
Tried to call pt to inform him that his procedure has to be cancelled for tomorrow since he did not go for his Covid screening.  Cancelled procedure and Melanie in Endo is aware.  Will mail out letter to patient since we were unable to reach him by phone informing him to contact our office to reschedule his procedure.

## 2019-10-14 NOTE — Telephone Encounter (Signed)
REVIEWED. AGREE. NO ADDITIONAL RECOMMENDATIONS. 

## 2019-10-14 NOTE — Telephone Encounter (Signed)
Received call from endo patient was no show for covid testing. Procedure scheduled for tomorrow

## 2019-10-14 NOTE — Telephone Encounter (Signed)
Tried to call pt but vm full 

## 2019-10-14 NOTE — Telephone Encounter (Signed)
Tried to call pt again but went to vm and it was full.

## 2019-10-15 ENCOUNTER — Encounter (HOSPITAL_COMMUNITY): Admission: RE | Payer: Self-pay | Source: Home / Self Care

## 2019-10-15 ENCOUNTER — Ambulatory Visit (HOSPITAL_COMMUNITY): Admission: RE | Admit: 2019-10-15 | Payer: Medicaid Other | Source: Home / Self Care | Admitting: Gastroenterology

## 2019-10-15 SURGERY — COLONOSCOPY
Anesthesia: Moderate Sedation

## 2019-11-06 ENCOUNTER — Other Ambulatory Visit: Payer: Self-pay

## 2019-11-06 ENCOUNTER — Ambulatory Visit: Payer: Medicaid Other | Admitting: Gastroenterology

## 2019-11-06 ENCOUNTER — Encounter: Payer: Self-pay | Admitting: Gastroenterology

## 2019-11-06 ENCOUNTER — Telehealth: Payer: Self-pay

## 2019-11-06 DIAGNOSIS — Z86010 Personal history of colonic polyps: Secondary | ICD-10-CM | POA: Diagnosis not present

## 2019-11-06 DIAGNOSIS — K625 Hemorrhage of anus and rectum: Secondary | ICD-10-CM

## 2019-11-06 MED ORDER — CLENPIQ 10-3.5-12 MG-GM -GM/160ML PO SOLN: 1.0000 | Freq: Once | ORAL | 0 refills | Status: AC

## 2019-11-06 NOTE — Progress Notes (Signed)
Primary Care Physician:  Celene Squibb, MD  Primary Gastroenterologist:  Barney Drain, MD   Chief Complaint  Patient presents with  . Rectal Bleeding    HPI:  [name] is a 64 y.o. male here to schedule colonoscopy.  He has a history of adenomatous colon polyps.  He was scheduled back in April but unfortunately his wife passed away and he was unable to complete the colonoscopy at that time. Last colonoscopy in December 2009, 3 polyps removed, tubular adenomas.  BM regular. No melena. Had some brbpr a couple of times. No rectal pain. Adjusting to not having his wife around to cook for him. No heartburn, dysphagia, vomiting. Umbilical hernia but no significant discomfort. Not interested in repair at this time.   Current Outpatient Medications  Medication Sig Dispense Refill  . acetaminophen (TYLENOL) 325 MG tablet Take 2 tablets (650 mg total) by mouth every 4 (four) hours as needed. (Patient taking differently: Take 650 mg by mouth as needed. )    . losartan-hydrochlorothiazide (HYZAAR) 50-12.5 MG tablet Take 1 tablet by mouth daily.    . metFORMIN (GLUCOPHAGE) 500 MG tablet Take 500 mg by mouth daily.      No current facility-administered medications for this visit.    Allergies as of 11/06/2019 - Review Complete 11/06/2019  Allergen Reaction Noted  . Flexeril [cyclobenzaprine] Shortness Of Breath 07/21/2013  . Robaxin [methocarbamol] Shortness Of Breath 07/21/2013    Past Medical History:  Diagnosis Date  . Arthritis    "right knee" (07/30/2017)  . CAP (community acquired pneumonia) 2018  . Hypertension   . Hypertension   . Wears glasses     Past Surgical History:  Procedure Laterality Date  . COLONOSCOPY  2009   Fields: 3 small tubular adenomas removed.  Marland Kitchen EYE SURGERY    . JOINT REPLACEMENT    . RETINAL DETACHMENT SURGERY Left 2000s  . TOTAL KNEE ARTHROPLASTY Right 07/30/2017  . TOTAL KNEE ARTHROPLASTY Right 07/30/2017   Procedure: RIGHT TOTAL KNEE ARTHROPLASTY;   Surgeon: [name], MD;  Location: [facility];  Service: Orthopedics;  Laterality: Right;  Marland Kitchen VASECTOMY      Family History  Problem Relation Age of Onset  . Colon cancer Neg Hx   . Colon polyps Neg Hx     Social History   Socioeconomic History  . Marital status: Married    Spouse name: Not on file  . Number of children: Not on file  . Years of education: Not on file  . Highest education level: Not on file  Occupational History  . Not on file  Tobacco Use  . Smoking status: Never Smoker  . Smokeless tobacco: Never Used  Substance and Sexual Activity  . Alcohol use: No  . Drug use: No  . Sexual activity: Never  Other Topics Concern  . Not on file  Social History Narrative  . Not on file   Social Determinants of Health   Financial Resource Strain:   . Difficulty of Paying Living Expenses:   Food Insecurity:   . Worried About Charity fundraiser in the Last Year:   . Arboriculturist in the Last Year:   Transportation Needs:   . Film/video editor (Medical):   Marland Kitchen Lack of Transportation (Non-Medical):   Physical Activity:   . Days of Exercise per Week:   . Minutes of Exercise per Session:   Stress:   . Feeling of Stress :   Social Connections:   .  Frequency of Communication with Friends and Family:   . Frequency of Social Gatherings with Friends and Family:   . Attends Religious Services:   . Active Member of Clubs or Organizations:   . Attends Archivist Meetings:   Marland Kitchen Marital Status:   Intimate Partner Violence:   . Fear of Current or Ex-Partner:   . Emotionally Abused:   Marland Kitchen Physically Abused:   . Sexually Abused:       ROS:  General: Negative for anorexia, weight loss, fever, chills, fatigue, weakness. Eyes: Negative for vision changes.  ENT: Negative for hoarseness, difficulty swallowing , nasal congestion. CV: Negative for chest pain, angina, palpitations, dyspnea on exertion, peripheral edema.  Respiratory: Negative for dyspnea at  rest, dyspnea on exertion, cough, sputum, wheezing.  GI: See history of present illness. GU:  Negative for dysuria, hematuria, urinary incontinence, urinary frequency, nocturnal urination.  MS: Negative for joint pain, low back pain.  Derm: Negative for rash or itching.  Neuro: Negative for weakness, abnormal sensation, seizure, frequent headaches, memory loss, confusion.  Psych: Negative for anxiety, depression, suicidal ideation, hallucinations.  Endo: Negative for unusual weight change.  Heme: Negative for bruising or bleeding. Allergy: Negative for rash or hives.    Physical Examination:  BP (!) 151/76   Pulse 63   Temp (!) 96.9 F (36.1 C) (Temporal)   Ht 5\' 6"  (1.676 m)   Wt 251 lb 9.6 oz (114.1 kg)   BMI 40.61 kg/m    General: Well-nourished, well-developed in no acute distress.  Head: Normocephalic, atraumatic.   Eyes: Conjunctiva pink, no icterus. Mouth: masked Neck: Supple without thyromegaly, masses, or lymphadenopathy.  Lungs: Clear to auscultation bilaterally.  Heart: Regular rate and rhythm, no murmurs rubs or gallops.  Abdomen: Bowel sounds are normal, nontender, nondistended, no hepatosplenomegaly or masses, no abdominal bruits, no rebound or guarding.  Small umb hernia, easily reducible and nontender Rectal: not performed Extremities: No lower extremity edema. No clubbing or deformities.  Neuro: Alert and oriented x 4 , grossly normal neurologically.  Skin: Warm and dry, no rash or jaundice.   Psych: Alert and cooperative, normal mood and affect.   Imaging Studies: XR KNEE 3 VIEW LEFT  Result Date: 10/07/2019 Films of the left knee obtained in 3 projections standing.  There is mild decrease in the medial joint space with some subchondral sclerosis of the femur.  No ectopic calcification.  Degenerative changes about the patellofemoral joint with minimal joint space narrowing but peripheral osteophytes.  Films are consistent with moderate osteoarthritis  predominately of the medial and patellofemoral joints

## 2019-11-06 NOTE — Telephone Encounter (Signed)
TCS instructions and covid test appt letter mailed to [address], [address]; [place], [place] per pt's request.

## 2019-11-06 NOTE — Assessment & Plan Note (Signed)
H/o adenomatous colon polyps, overdue for surveillance colonoscopy. Couple of episodes of small volume rectal bleeding. Plan for colonoscopy in the near future. ASA II. I have discussed the risks, alternatives, benefits with regards to but not limited to the risk of reaction to medication, bleeding, infection, perforation and the patient is agreeable to proceed. Written consent to be obtained.

## 2019-11-06 NOTE — H&P (View-Only) (Signed)
Primary Care Physician:  Celene Squibb, MD  Primary Gastroenterologist:  Barney Drain, MD   Chief Complaint  Patient presents with  . Rectal Bleeding    HPI:  [name] is a 64 y.o. male here to schedule colonoscopy.  He has a history of adenomatous colon polyps.  He was scheduled back in April but unfortunately his wife passed away and he was unable to complete the colonoscopy at that time. Last colonoscopy in December 2009, 3 polyps removed, tubular adenomas.  BM regular. No melena. Had some brbpr a couple of times. No rectal pain. Adjusting to not having his wife around to cook for him. No heartburn, dysphagia, vomiting. Umbilical hernia but no significant discomfort. Not interested in repair at this time.   Current Outpatient Medications  Medication Sig Dispense Refill  . acetaminophen (TYLENOL) 325 MG tablet Take 2 tablets (650 mg total) by mouth every 4 (four) hours as needed. (Patient taking differently: Take 650 mg by mouth as needed. )    . losartan-hydrochlorothiazide (HYZAAR) 50-12.5 MG tablet Take 1 tablet by mouth daily.    . metFORMIN (GLUCOPHAGE) 500 MG tablet Take 500 mg by mouth daily.      No current facility-administered medications for this visit.    Allergies as of 11/06/2019 - Review Complete 11/06/2019  Allergen Reaction Noted  . Flexeril [cyclobenzaprine] Shortness Of Breath 07/21/2013  . Robaxin [methocarbamol] Shortness Of Breath 07/21/2013    Past Medical History:  Diagnosis Date  . Arthritis    "right knee" (07/30/2017)  . CAP (community acquired pneumonia) 2018  . Hypertension   . Hypertension   . Wears glasses     Past Surgical History:  Procedure Laterality Date  . COLONOSCOPY  2009   Fields: 3 small tubular adenomas removed.  Marland Kitchen EYE SURGERY    . JOINT REPLACEMENT    . RETINAL DETACHMENT SURGERY Left 2000s  . TOTAL KNEE ARTHROPLASTY Right 07/30/2017  . TOTAL KNEE ARTHROPLASTY Right 07/30/2017   Procedure: RIGHT TOTAL KNEE ARTHROPLASTY;   Surgeon: [name], MD;  Location: [facility];  Service: Orthopedics;  Laterality: Right;  Marland Kitchen VASECTOMY      Family History  Problem Relation Age of Onset  . Colon cancer Neg Hx   . Colon polyps Neg Hx     Social History   Socioeconomic History  . Marital status: Married    Spouse name: Not on file  . Number of children: Not on file  . Years of education: Not on file  . Highest education level: Not on file  Occupational History  . Not on file  Tobacco Use  . Smoking status: Never Smoker  . Smokeless tobacco: Never Used  Substance and Sexual Activity  . Alcohol use: No  . Drug use: No  . Sexual activity: Never  Other Topics Concern  . Not on file  Social History Narrative  . Not on file   Social Determinants of Health   Financial Resource Strain:   . Difficulty of Paying Living Expenses:   Food Insecurity:   . Worried About Charity fundraiser in the Last Year:   . Arboriculturist in the Last Year:   Transportation Needs:   . Film/video editor (Medical):   Marland Kitchen Lack of Transportation (Non-Medical):   Physical Activity:   . Days of Exercise per Week:   . Minutes of Exercise per Session:   Stress:   . Feeling of Stress :   Social Connections:   .  Frequency of Communication with Friends and Family:   . Frequency of Social Gatherings with Friends and Family:   . Attends Religious Services:   . Active Member of Clubs or Organizations:   . Attends Archivist Meetings:   Marland Kitchen Marital Status:   Intimate Partner Violence:   . Fear of Current or Ex-Partner:   . Emotionally Abused:   Marland Kitchen Physically Abused:   . Sexually Abused:       ROS:  General: Negative for anorexia, weight loss, fever, chills, fatigue, weakness. Eyes: Negative for vision changes.  ENT: Negative for hoarseness, difficulty swallowing , nasal congestion. CV: Negative for chest pain, angina, palpitations, dyspnea on exertion, peripheral edema.  Respiratory: Negative for dyspnea at  rest, dyspnea on exertion, cough, sputum, wheezing.  GI: See history of present illness. GU:  Negative for dysuria, hematuria, urinary incontinence, urinary frequency, nocturnal urination.  MS: Negative for joint pain, low back pain.  Derm: Negative for rash or itching.  Neuro: Negative for weakness, abnormal sensation, seizure, frequent headaches, memory loss, confusion.  Psych: Negative for anxiety, depression, suicidal ideation, hallucinations.  Endo: Negative for unusual weight change.  Heme: Negative for bruising or bleeding. Allergy: Negative for rash or hives.    Physical Examination:  BP (!) 151/76   Pulse 63   Temp (!) 96.9 F (36.1 C) (Temporal)   Ht 5\' 6"  (1.676 m)   Wt 251 lb 9.6 oz (114.1 kg)   BMI 40.61 kg/m    General: Well-nourished, well-developed in no acute distress.  Head: Normocephalic, atraumatic.   Eyes: Conjunctiva pink, no icterus. Mouth: masked Neck: Supple without thyromegaly, masses, or lymphadenopathy.  Lungs: Clear to auscultation bilaterally.  Heart: Regular rate and rhythm, no murmurs rubs or gallops.  Abdomen: Bowel sounds are normal, nontender, nondistended, no hepatosplenomegaly or masses, no abdominal bruits, no rebound or guarding.  Small umb hernia, easily reducible and nontender Rectal: not performed Extremities: No lower extremity edema. No clubbing or deformities.  Neuro: Alert and oriented x 4 , grossly normal neurologically.  Skin: Warm and dry, no rash or jaundice.   Psych: Alert and cooperative, normal mood and affect.   Imaging Studies: XR KNEE 3 VIEW LEFT  Result Date: 10/07/2019 Films of the left knee obtained in 3 projections standing.  There is mild decrease in the medial joint space with some subchondral sclerosis of the femur.  No ectopic calcification.  Degenerative changes about the patellofemoral joint with minimal joint space narrowing but peripheral osteophytes.  Films are consistent with moderate osteoarthritis  predominately of the medial and patellofemoral joints

## 2019-11-06 NOTE — Patient Instructions (Signed)
1. Colonoscopy as scheduled. See separate instructions.  

## 2019-11-11 ENCOUNTER — Ambulatory Visit: Payer: Medicaid Other | Admitting: Nurse Practitioner

## 2019-12-01 ENCOUNTER — Other Ambulatory Visit: Payer: Self-pay

## 2019-12-01 ENCOUNTER — Other Ambulatory Visit (HOSPITAL_COMMUNITY)
Admission: RE | Admit: 2019-12-01 | Discharge: 2019-12-01 | Disposition: A | Discharge status: Home / Self Care | Payer: Medicaid Other | Source: Ambulatory Visit | Attending: Internal Medicine | Admitting: Internal Medicine

## 2019-12-01 DIAGNOSIS — Z20822 Contact with and (suspected) exposure to covid-19: Secondary | ICD-10-CM | POA: Diagnosis not present

## 2019-12-01 DIAGNOSIS — Z01812 Encounter for preprocedural laboratory examination: Secondary | ICD-10-CM | POA: Diagnosis not present

## 2019-12-01 LAB — SARS CORONAVIRUS 2 (TAT 6-24 HRS): SARS Coronavirus 2: NEGATIVE

## 2019-12-02 ENCOUNTER — Ambulatory Visit (HOSPITAL_COMMUNITY)
Admission: RE | Admit: 2019-12-02 | Discharge: 2019-12-02 | Disposition: A | Discharge status: Home / Self Care | Payer: Medicaid Other | Attending: Internal Medicine | Admitting: Internal Medicine

## 2019-12-02 ENCOUNTER — Other Ambulatory Visit: Payer: Self-pay

## 2019-12-02 ENCOUNTER — Encounter (HOSPITAL_COMMUNITY): Payer: Self-pay | Admitting: Internal Medicine

## 2019-12-02 ENCOUNTER — Encounter (HOSPITAL_COMMUNITY)
Admission: RE | Disposition: A | Discharge status: Home / Self Care | Payer: Self-pay | Source: Home / Self Care | Attending: Internal Medicine

## 2019-12-02 DIAGNOSIS — Z96651 Presence of right artificial knee joint: Secondary | ICD-10-CM | POA: Insufficient documentation

## 2019-12-02 DIAGNOSIS — M1711 Unilateral primary osteoarthritis, right knee: Secondary | ICD-10-CM | POA: Insufficient documentation

## 2019-12-02 DIAGNOSIS — I10 Essential (primary) hypertension: Secondary | ICD-10-CM | POA: Insufficient documentation

## 2019-12-02 DIAGNOSIS — Q438 Other specified congenital malformations of intestine: Secondary | ICD-10-CM | POA: Insufficient documentation

## 2019-12-02 DIAGNOSIS — Z888 Allergy status to other drugs, medicaments and biological substances status: Secondary | ICD-10-CM | POA: Diagnosis not present

## 2019-12-02 DIAGNOSIS — Z79899 Other long term (current) drug therapy: Secondary | ICD-10-CM | POA: Insufficient documentation

## 2019-12-02 DIAGNOSIS — K921 Melena: Secondary | ICD-10-CM | POA: Insufficient documentation

## 2019-12-02 DIAGNOSIS — D120 Benign neoplasm of cecum: Secondary | ICD-10-CM | POA: Insufficient documentation

## 2019-12-02 DIAGNOSIS — K635 Polyp of colon: Secondary | ICD-10-CM | POA: Insufficient documentation

## 2019-12-02 DIAGNOSIS — K640 First degree hemorrhoids: Secondary | ICD-10-CM | POA: Diagnosis not present

## 2019-12-02 HISTORY — DX: Type 2 diabetes mellitus without complications: E11.9

## 2019-12-02 HISTORY — PX: POLYPECTOMY: SHX5525

## 2019-12-02 HISTORY — PX: COLONOSCOPY: SHX5424

## 2019-12-02 LAB — GLUCOSE, CAPILLARY: Glucose-Capillary: 85 mg/dL (ref 70–99)

## 2019-12-02 SURGERY — COLONOSCOPY
Anesthesia: Moderate Sedation

## 2019-12-02 MED ORDER — MEPERIDINE HCL 50 MG/ML IJ SOLN
INTRAMUSCULAR | Status: AC
  Filled 2019-12-02: qty 1

## 2019-12-02 MED ORDER — MEPERIDINE HCL 100 MG/ML IJ SOLN
INTRAMUSCULAR | Status: DC | PRN
  Administered 2019-12-02: 15 mg
  Administered 2019-12-02: 25 mg

## 2019-12-02 MED ORDER — ONDANSETRON HCL 4 MG/2ML IJ SOLN
INTRAMUSCULAR | Status: AC
  Filled 2019-12-02: qty 2

## 2019-12-02 MED ORDER — SODIUM CHLORIDE 0.9 % IV SOLN: INTRAVENOUS | Status: DC

## 2019-12-02 MED ORDER — ONDANSETRON HCL 4 MG/2ML IJ SOLN
INTRAMUSCULAR | Status: DC | PRN
  Administered 2019-12-02: 4 mg via INTRAVENOUS

## 2019-12-02 MED ORDER — MIDAZOLAM HCL 5 MG/5ML IJ SOLN
INTRAMUSCULAR | Status: DC | PRN
  Administered 2019-12-02 (×3): 1 mg via INTRAVENOUS
  Administered 2019-12-02: 2 mg via INTRAVENOUS
  Administered 2019-12-02 (×3): 1 mg via INTRAVENOUS

## 2019-12-02 MED ORDER — MIDAZOLAM HCL 5 MG/5ML IJ SOLN
INTRAMUSCULAR | Status: AC
  Filled 2019-12-02: qty 10

## 2019-12-02 NOTE — Op Note (Signed)
[facility] Patient Name: [name] Procedure Date: 12/02/2019 12:38 PM MRN: [id number] Date of Birth: May 01, 1956 Attending MD: Norvel Richards , MD CSN: [id number] Age: 64 Admit Type: Outpatient Procedure:                Colonoscopy Indications:              Hematochezia Providers:                Norvel Richards, MD, Janeece Riggers, RN, Randa Spike, Technician Referring MD:              Medicines:                Midazolam 8 mg IV, Meperidine 40 mg IV, Ondansetron                            4 mg IV Complications:            No immediate complications. Estimated Blood Loss:     Estimated blood loss was minimal. Procedure:                Pre-Anesthesia Assessment:                           - Prior to the procedure, a History and Physical                            was performed, and patient medications and                            allergies were reviewed. The patient's tolerance of                            previous anesthesia was also reviewed. The risks                            and benefits of the procedure and the sedation                            options and risks were discussed with the patient.                            All questions were answered, and informed consent                            was obtained. Prior Anticoagulants: The patient has                            taken no previous anticoagulant or antiplatelet                            agents. ASA Grade Assessment: III - A patient with  severe systemic disease. After reviewing the risks                            and benefits, the patient was deemed in                            satisfactory condition to undergo the procedure.                           After obtaining informed consent, the colonoscope                            was passed under direct vision. Throughout the                            procedure, the patient's blood pressure,  pulse, and                            oxygen saturations were monitored continuously. The                            [id number] scope was introduced through                            the anus and advanced to the the cecum, identified                            by appendiceal orifice and ileocecal valve. The                            colonoscopy was performed without difficulty. The                            patient tolerated the procedure well. The quality                            of the bowel preparation was adequate. Scope In: 12:54:04 PM Scope Out: 1:41:01 PM Scope Withdrawal Time: 0 hours 11 minutes 51 seconds  Total Procedure Duration: 0 hours 46 minutes 57 seconds  Findings:      The perianal and digital rectal examinations were normal. Redundant       colon. Required external abdominal pressure and changing of the       patient's position multiple times to reach the cecum.      Three sessile polyps were found in the sigmoid colon and ileocecal       valve. The polyps were 4 to 5 mm in size. These polyps were removed with       a cold snare. Resection and retrieval were complete. Estimated blood       loss was minimal.      The exam was otherwise without abnormality on direct and retroflexion       views.      Non-bleeding internal hemorrhoids were found during retroflexion. The       hemorrhoids were mild, small and Grade I (internal hemorrhoids that do  not prolapse). Impression:               - Three 4 to 5 mm polyps in the sigmoid colon and                            at the ileocecal valve, removed with a cold snare.                            Resected and retrieved. Redundant colon.                           - The examination was otherwise normal on direct                            and retroflexion views.                           - Non-bleeding internal hemorrhoids. Moderate Sedation:      Moderate (conscious) sedation was administered by the endoscopy  nurse       and supervised by the endoscopist. The following parameters were       monitored: oxygen saturation, heart rate, blood pressure, respiratory       rate, EKG, adequacy of pulmonary ventilation, and response to care.       Total physician intraservice time was 53 minutes. Recommendation:           - Patient has a contact number available for                            emergencies. The signs and symptoms of potential                            delayed complications were discussed with the                            patient. Return to normal activities tomorrow.                            Written discharge instructions were provided to the                            patient.                           - Advance diet as tolerated.                           - Continue present medications.                           - Repeat colonoscopy date to be determined after                            pending pathology results are reviewed for  surveillance.                           - Return to GI clinic (date not yet determined). Procedure Code(s):        --- Professional ---                           [id number], Colonoscopy, flexible; with removal of                            tumor(s), polyp(s), or other lesion(s) by snare                            technique                           99153, Moderate sedation; each additional 15                            minutes intraservice time                           99153, Moderate sedation; each additional 15                            minutes intraservice time                           99153, Moderate sedation; each additional 15                            minutes intraservice time                           G0500, Moderate sedation services provided by the                            same physician or other qualified health care                            professional performing a gastrointestinal                             endoscopic service that sedation supports,                            requiring the presence of an independent trained                            observer to assist in the monitoring of the                            patient's level of consciousness and physiological                            status; initial 15 minutes of intra-service time;  patient age 66 years or older (additional time may                            be reported with [phone], as appropriate) Diagnosis Code(s):        --- Professional ---                           K63.5, Polyp of colon                           K64.0, First degree hemorrhoids                           K92.1, Melena (includes Hematochezia) CPT copyright 2019 [employer]. All rights reserved. The codes documented in this report are preliminary and upon coder review may  be revised to meet current compliance requirements. Cristopher Estimable. Chaunte Hornbeck, MD Norvel Richards, MD 12/02/2019 1:57:09 PM This report has been signed electronically. Number of Addenda: 0

## 2019-12-02 NOTE — Discharge Instructions (Signed)
Colonoscopy Discharge Instructions  Read the instructions outlined below and refer to this sheet in the next few weeks. These discharge instructions provide you with general information on caring for yourself after you leave the hospital. Your doctor may also give you specific instructions. While your treatment has been planned according to the most current medical practices available, unavoidable complications occasionally occur. If you have any problems or questions after discharge, call Dr. Gala Romney at [phone]. ACTIVITY  You may resume your regular activity, but move at a slower pace for the next 24 hours.   Take frequent rest periods for the next 24 hours.   Walking will help get rid of the air and reduce the bloated feeling in your belly (abdomen).   No driving for 24 hours (because of the medicine (anesthesia) used during the test).    Do not sign any important legal documents or operate any machinery for 24 hours (because of the anesthesia used during the test).  NUTRITION  Drink plenty of fluids.   You may resume your normal diet as instructed by your doctor.   Begin with a light meal and progress to your normal diet. Heavy or fried foods are harder to digest and may make you feel sick to your stomach (nauseated).   Avoid alcoholic beverages for 24 hours or as instructed.  MEDICATIONS  You may resume your normal medications unless your doctor tells you otherwise.  WHAT YOU CAN EXPECT TODAY  Some feelings of bloating in the abdomen.   Passage of more gas than usual.   Spotting of blood in your stool or on the toilet paper.  IF YOU HAD POLYPS REMOVED DURING THE COLONOSCOPY:  No aspirin products for 7 days or as instructed.   No alcohol for 7 days or as instructed.   Eat a soft diet for the next 24 hours.  FINDING OUT THE RESULTS OF YOUR TEST Not all test results are available during your visit. If your test results are not back during the visit, make an appointment  with your caregiver to find out the results. Do not assume everything is normal if you have not heard from your caregiver or the medical facility. It is important for you to follow up on all of your test results.  SEEK IMMEDIATE MEDICAL ATTENTION IF:  You have more than a spotting of blood in your stool.   Your belly is swollen (abdominal distention).   You are nauseated or vomiting.   You have a temperature over 101.   You have abdominal pain or discomfort that is severe or gets worse throughout the day.    Colon polyp information provided  Further recommendations to follow pending review of pathology report  At patient request, I called Oswaldo Milian at [phone] recording voicemail "not set up yet"   Colon Polyps  Polyps are tissue growths inside the body. Polyps can grow in many places, including the large intestine (colon). A polyp may be a round bump or a mushroom-shaped growth. You could have one polyp or several. Most colon polyps are noncancerous (benign). However, some colon polyps can become cancerous over time. Finding and removing the polyps early can help prevent this. What are the causes? The exact cause of colon polyps is not known. What increases the risk? You are more likely to develop this condition if you:  Have a family history of colon cancer or colon polyps.  Are older than 76 or older than 45 if you are African American.  Have inflammatory  bowel disease, such as ulcerative colitis or Crohn's disease.  Have certain hereditary conditions, such as: ? Familial adenomatous polyposis. ? Lynch syndrome. ? Turcot syndrome. ? Peutz-Jeghers syndrome.  Are overweight.  Smoke cigarettes.  Do not get enough exercise.  Drink too much alcohol.  Eat a diet that is high in fat and red meat and low in fiber.  Had childhood cancer that was treated with abdominal radiation. What are the signs or symptoms? Most polyps do not cause symptoms. If you have  symptoms, they may include:  Blood coming from your rectum when having a bowel movement.  Blood in your stool. The stool may look dark red or black.  Abdominal pain.  A change in bowel habits, such as constipation or diarrhea. How is this diagnosed? This condition is diagnosed with a colonoscopy. This is a procedure in which a lighted, flexible scope is inserted into the anus and then passed into the colon to examine the area. Polyps are sometimes found when a colonoscopy is done as part of routine cancer screening tests. How is this treated? Treatment for this condition involves removing any polyps that are found. Most polyps can be removed during a colonoscopy. Those polyps will then be tested for cancer. Additional treatment may be needed depending on the results of testing. Follow these instructions at home: Lifestyle  Maintain a healthy weight, or lose weight if recommended by your health care provider.  Exercise every day or as told by your health care provider.  Do not use any products that contain nicotine or tobacco, such as cigarettes and e-cigarettes. If you need help quitting, ask your health care provider.  If you drink alcohol, limit how much you have: ? 0-1 drink a day for women. ? 0-2 drinks a day for men.  Be aware of how much alcohol is in your drink. In the [place] one drink equals one 12 oz bottle of beer (355 mL), one 5 oz glass of wine (148 mL), or one 1 oz shot of hard liquor (44 mL). Eating and drinking   Eat foods that are high in fiber, such as fruits, vegetables, and whole grains.  Eat foods that are high in calcium and vitamin D, such as milk, cheese, yogurt, eggs, liver, fish, and broccoli.  Limit foods that are high in fat, such as fried foods and desserts.  Limit the amount of red meat and processed meat you eat, such as hot dogs, sausage, bacon, and lunch meats. General instructions  Keep all follow-up visits as told by your health care provider.  This is important. ? This includes having regularly scheduled colonoscopies. ? Talk to your health care provider about when you need a colonoscopy. Contact a health care provider if:  You have new or worsening bleeding during a bowel movement.  You have new or increased blood in your stool.  You have a change in bowel habits.  You lose weight for no known reason. Summary  Polyps are tissue growths inside the body. Polyps can grow in many places, including the colon.  Most colon polyps are noncancerous (benign), but some can become cancerous over time.  This condition is diagnosed with a colonoscopy.  Treatment for this condition involves removing any polyps that are found. Most polyps can be removed during a colonoscopy. This information is not intended to replace advice given to you by your health care provider. Make sure you discuss any questions you have with your health care provider. Document Revised: 10/03/2017 Document Reviewed:  10/03/2017 Elsevier Patient Education  [place].

## 2019-12-02 NOTE — Interval H&P Note (Signed)
History and Physical Interval Note:  12/02/2019 12:43 PM  [name]  has presented today for surgery, with the diagnosis of rectal bleeding, history of colon cancer.  The various methods of treatment have been discussed with the patient and family. After consideration of risks, benefits and other options for treatment, the patient has consented to  Procedure(s) with comments: COLONOSCOPY (N/A) - 1:00pm as a surgical intervention.  The patient's history has been reviewed, patient examined, no change in status, stable for surgery.  I have reviewed the patient's chart and labs.  Questions were answered to the patient's satisfaction.     [name]  No change.  Diagnostic colonoscopy per plan.  The risks, benefits, limitations, alternatives and imponderables have been reviewed with the patient. Questions have been answered. All parties are agreeable.

## 2019-12-03 ENCOUNTER — Encounter: Payer: Self-pay | Admitting: Internal Medicine

## 2019-12-03 ENCOUNTER — Telehealth: Payer: Self-pay

## 2019-12-03 LAB — SURGICAL PATHOLOGY

## 2019-12-03 NOTE — Telephone Encounter (Signed)
Letter and banding pamphlet mailed to the pt.

## 2019-12-03 NOTE — Telephone Encounter (Signed)
Per RMR- Send letter to patient.  Send copy of letter with path to referring provider and PCP.  Lets send the patient a pamphlet on hemorrhoid banding with this letter.   Office visit with AB in several weeks if not already scheduled.

## 2019-12-07 ENCOUNTER — Encounter: Payer: Self-pay | Admitting: Gastroenterology

## 2019-12-07 NOTE — Telephone Encounter (Signed)
AB next opening was 8/3 at 330pm. I mailed appt letter to patient.

## 2019-12-25 ENCOUNTER — Encounter (HOSPITAL_COMMUNITY): Payer: Self-pay | Admitting: *Deleted

## 2019-12-25 ENCOUNTER — Emergency Department (HOSPITAL_COMMUNITY): Payer: Medicaid Other

## 2019-12-25 ENCOUNTER — Other Ambulatory Visit: Payer: Self-pay

## 2019-12-25 ENCOUNTER — Emergency Department (HOSPITAL_COMMUNITY)
Admission: EM | Admit: 2019-12-25 | Discharge: 2019-12-25 | Disposition: A | Discharge status: Home / Self Care | Payer: Medicaid Other | Attending: Emergency Medicine | Admitting: Emergency Medicine

## 2019-12-25 DIAGNOSIS — M7071 Other bursitis of hip, right hip: Secondary | ICD-10-CM

## 2019-12-25 DIAGNOSIS — Y939 Activity, unspecified: Secondary | ICD-10-CM | POA: Insufficient documentation

## 2019-12-25 DIAGNOSIS — Z91018 Allergy to other foods: Secondary | ICD-10-CM | POA: Insufficient documentation

## 2019-12-25 DIAGNOSIS — K648 Other hemorrhoids: Secondary | ICD-10-CM | POA: Diagnosis not present

## 2019-12-25 DIAGNOSIS — I10 Essential (primary) hypertension: Secondary | ICD-10-CM | POA: Insufficient documentation

## 2019-12-25 DIAGNOSIS — M7061 Trochanteric bursitis, right hip: Secondary | ICD-10-CM | POA: Diagnosis not present

## 2019-12-25 DIAGNOSIS — M25551 Pain in right hip: Secondary | ICD-10-CM | POA: Diagnosis present

## 2019-12-25 IMAGING — DX DG HIP (WITH OR WITHOUT PELVIS) 2-3V*R*
3 series · 3 of 3 positions shown · non-contrast
Comparison: None.

CLINICAL DATA: Acute right hip pain without known injury.

EXAM:
DG HIP (WITH OR WITHOUT PELVIS) 2-3V RIGHT

[pelvis ap]
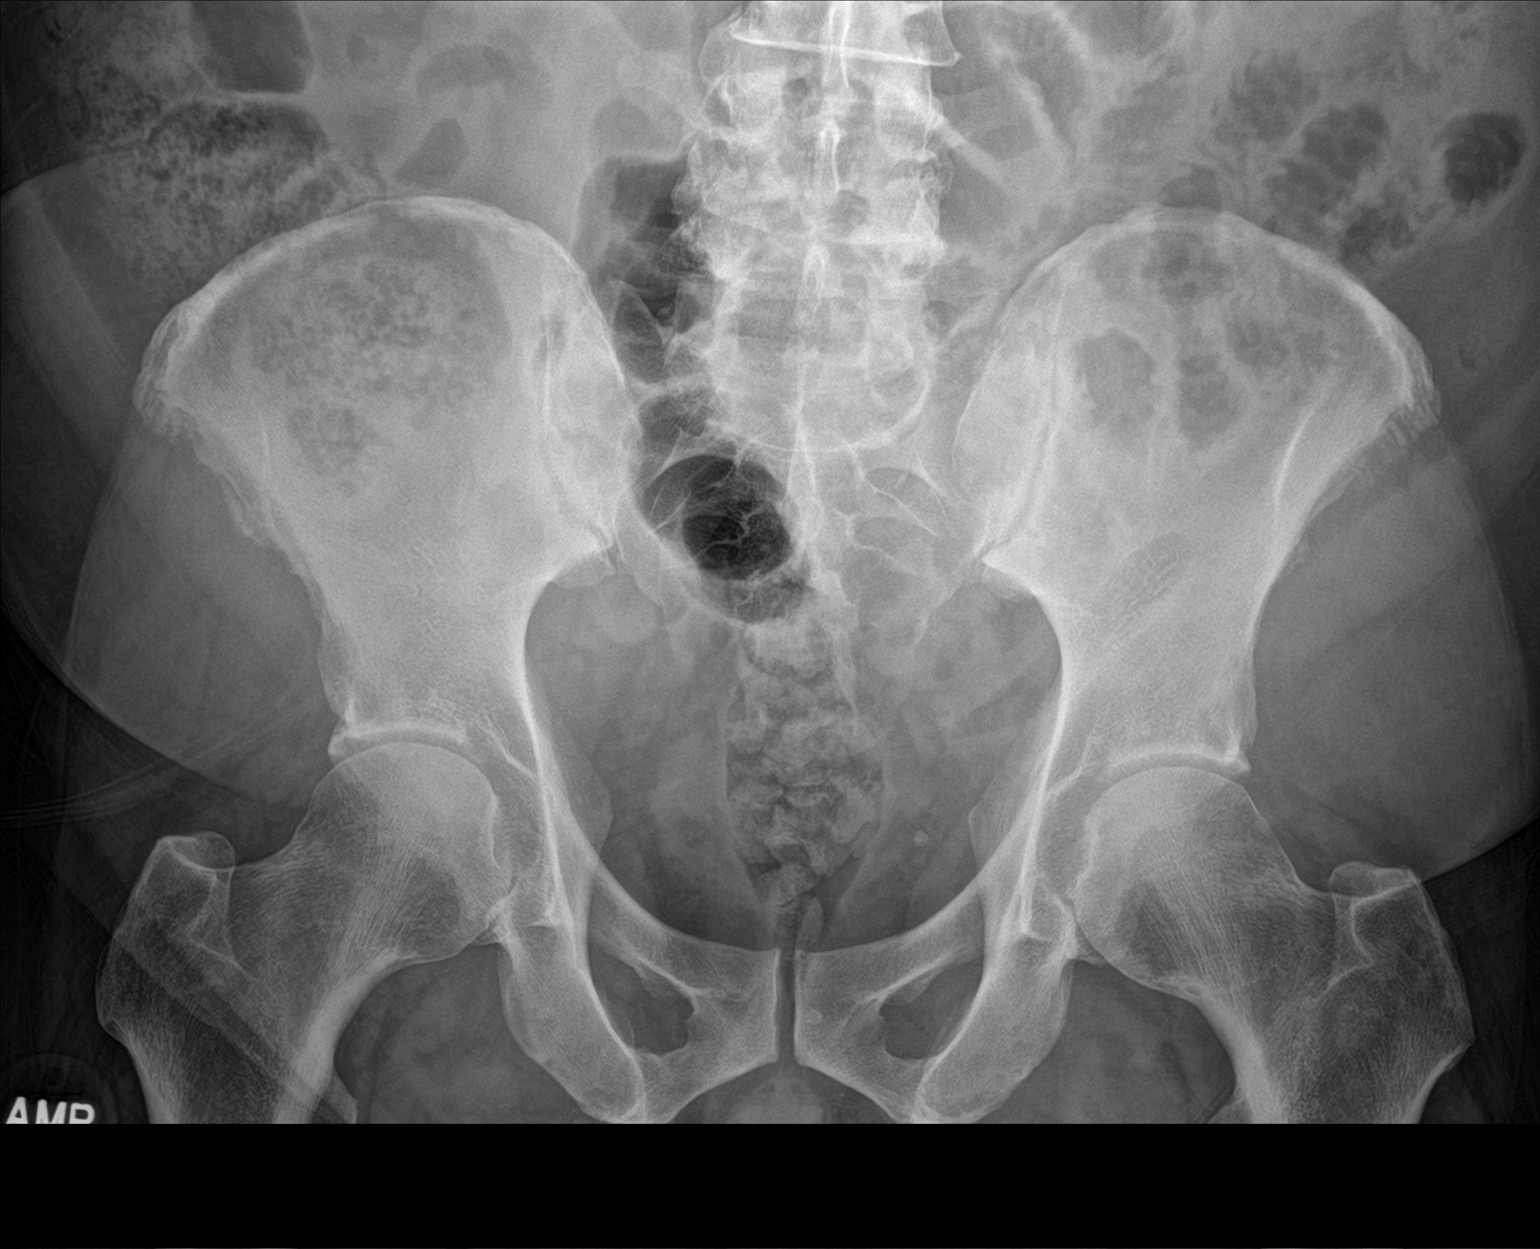

[hip ap]
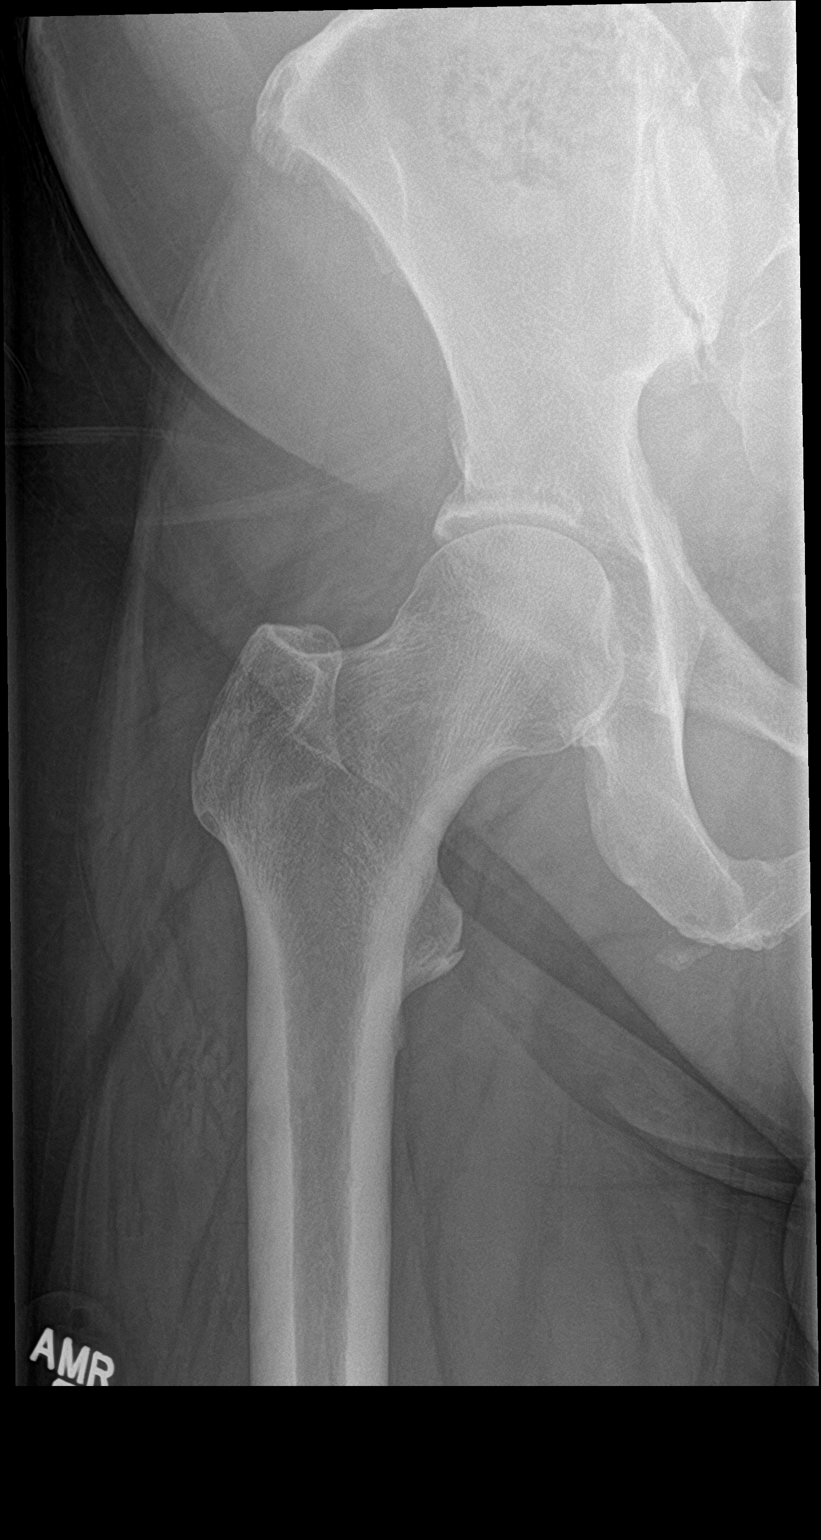

[hip frog leg]
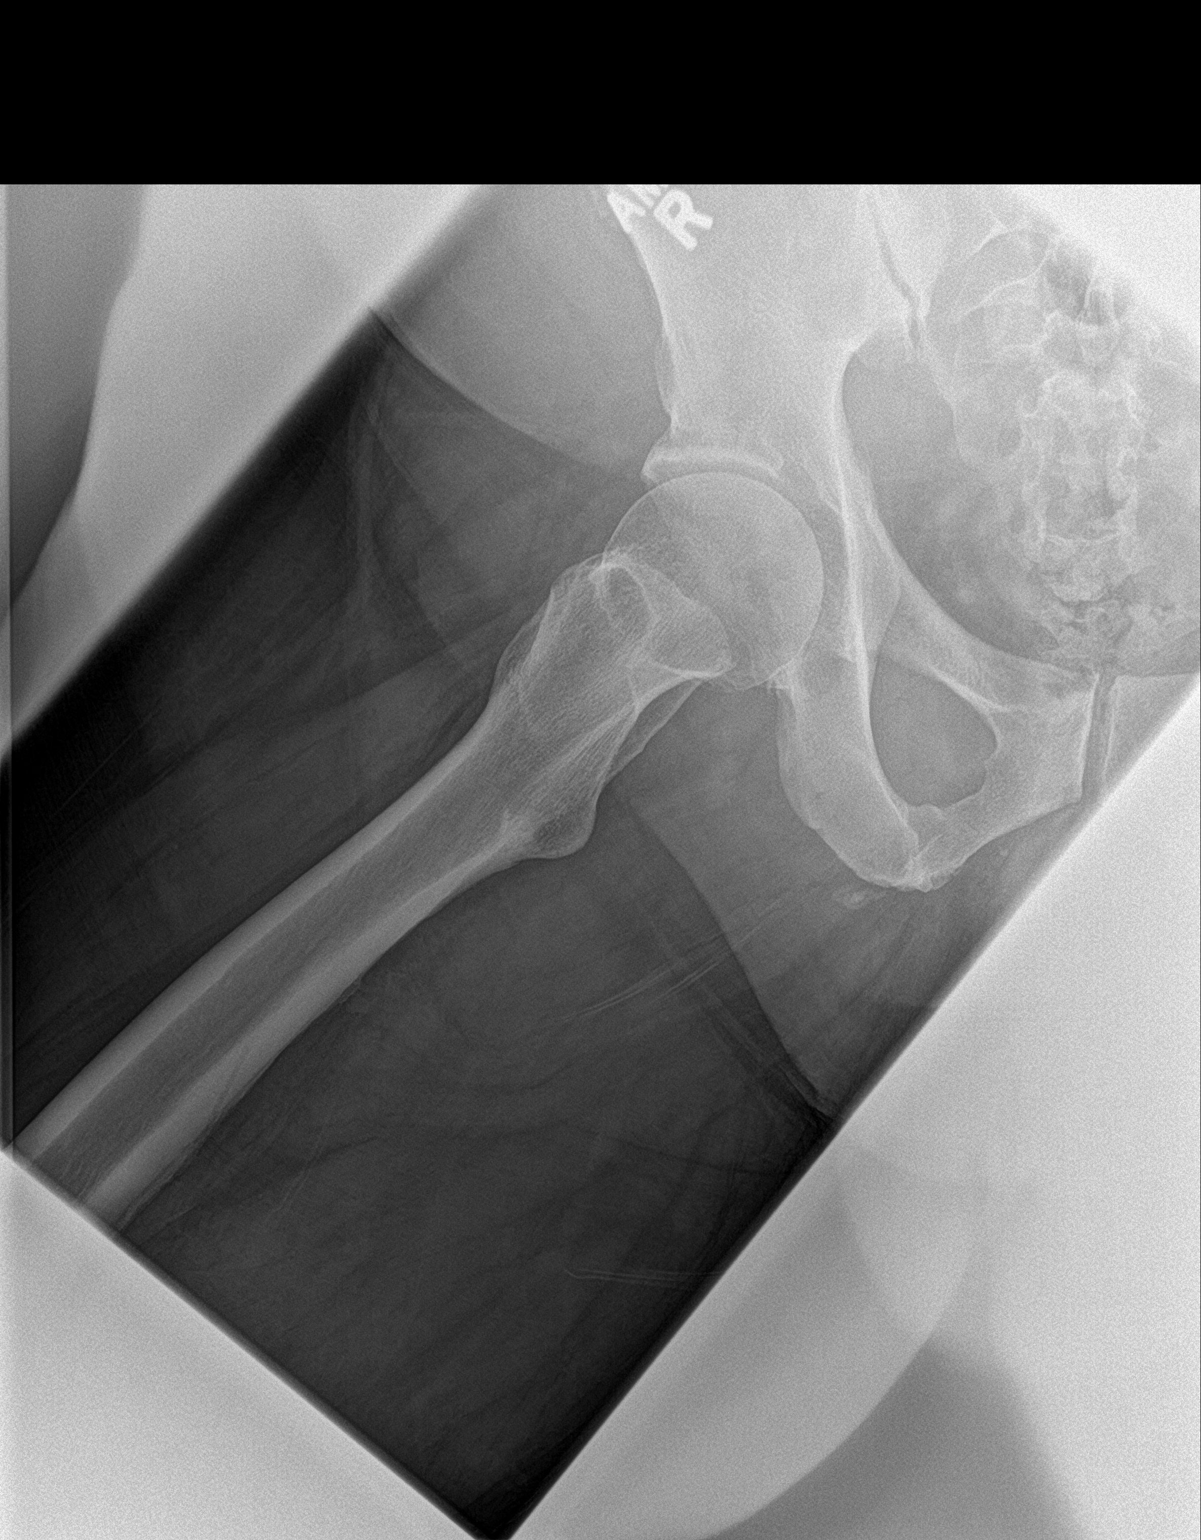

[3 of 3 positions shown; findings below may reference images not displayed]

FINDINGS: There is no evidence of hip fracture or dislocation. There is no
evidence of arthropathy or other focal bone abnormality.
IMPRESSION: Negative.

## 2019-12-25 NOTE — ED Triage Notes (Signed)
Pt c/o right flank pain that started yesterday; pt denies any injury and any urinary symptoms

## 2019-12-25 NOTE — ED Provider Notes (Signed)
[facility] EMERGENCY DEPARTMENT Provider Note   CSN: [id number] Arrival date & time: 12/25/19  3151     History Chief Complaint  Patient presents with   Flank Pain    [name] is a 64 y.o. male.  HPI He presents for evaluation of right hip pain which started yesterday, without trauma.  No similar problems in the past.  He had a colonoscopy, 3 weeks ago with polypectomy, to evaluate hematochezia.  He was noted at that time to have some internal hemorrhoids which were nonbleeding.  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  No prior similar problems.  He denies abdominal pain, dizziness or weakness.  There are no other known modifying factors.    Past Medical History:  Diagnosis Date   Arthritis    "right knee" (07/30/2017)   CAP (community acquired pneumonia) 2018   Diabetes mellitus without complication ([place])    Hypertension    Hypertension    Wears glasses     Patient Active Problem List   Diagnosis Date Noted   Rectal bleeding 11/06/2019   Hx of adenomatous colonic polyps 11/06/2019   Unilateral primary osteoarthritis, left knee 10/07/2019   Morbid (severe) obesity due to excess calories ([place]) 10/07/2019   Chronic pain of right knee 10/08/2018   Chronic right shoulder pain 05/07/2018   Primary osteoarthritis of right knee 07/30/2017   History of total right knee replacement 07/30/2017   Colon cancer screening 76/16/0737   Umbilical hernia without mention of obstruction or gangrene 10/28/2013    Past Surgical History:  Procedure Laterality Date   COLONOSCOPY  2009   Fields: 3 small tubular adenomas removed.   COLONOSCOPY N/A 12/02/2019   Procedure: COLONOSCOPY;  Surgeon: Daneil Dolin, MD;  [facility];  Service: Endoscopy;  Laterality: N/A;  1:00pm   EYE SURGERY     JOINT REPLACEMENT     POLYPECTOMY  12/02/2019   Procedure: POLYPECTOMY;  Surgeon: Daneil Dolin, MD;  Location[facility];  Service:  Endoscopy;;   RETINAL DETACHMENT SURGERY Left 2000s   TOTAL KNEE ARTHROPLASTY Right 07/30/2017   TOTAL KNEE ARTHROPLASTY Right 07/30/2017   Procedure: RIGHT TOTAL KNEE ARTHROPLASTY;  Surgeon: [name], MD;  Location: [facility];  Service: Orthopedics;  Laterality: Right;   VASECTOMY         Family History  Problem Relation Age of Onset   Colon cancer Neg Hx    Colon polyps Neg Hx     Social History   Tobacco Use   Smoking status: Never Smoker   Smokeless tobacco: Never Used  Vaping Use   Vaping Use: Never used  Substance Use Topics   Alcohol use: No   Drug use: No    Home Medications Prior to Admission medications   Medication Sig Start Date End Date Taking? Authorizing Provider  acetaminophen (TYLENOL) 325 MG tablet Take 2 tablets (650 mg total) by mouth every 4 (four) hours as needed. Patient taking differently: Take 650 mg by mouth as needed.  08/01/17   Cherylann Ratel, PA-C  losartan-hydrochlorothiazide (HYZAAR) 50-12.5 MG tablet Take 1 tablet by mouth daily.    [provider]  metFORMIN (GLUCOPHAGE) 500 MG tablet Take 500 mg by mouth daily.  05/19/19   [provider]    Allergies    Flexeril [cyclobenzaprine] and Robaxin [methocarbamol]  Review of Systems   Review of Systems  All other systems reviewed and are negative.   Physical Exam Updated  Vital Signs BP 130/77 (BP Location: Right Arm)    Pulse (!) 53    Temp 98.2 F (36.8 C) (Oral)    Resp 11    Ht 5\' 5"  (1.651 m)    Wt 108.9 kg    SpO2 99%    BMI 39.94 kg/m   Physical Exam Vitals and nursing note reviewed.  Constitutional:      General: He is not in acute distress.    Appearance: He is well-developed. He is not ill-appearing, toxic-appearing or diaphoretic.  HENT:     Head: Normocephalic and atraumatic.     Right Ear: External ear normal.     Left Ear: External ear normal.  Eyes:     Conjunctiva/sclera: Conjunctivae normal.     Pupils: Pupils are equal,  round, and reactive to light.  Neck:     Trachea: Phonation normal.  Cardiovascular:     Rate and Rhythm: Normal rate.  Pulmonary:     Effort: Pulmonary effort is normal.  Abdominal:     General: There is no distension.  Musculoskeletal:     Cervical back: Normal range of motion and neck supple.     Comments: He guards against movement of the right hip secondary to pain, but is able to ambulate without a limp.  Otherwise normal motion of arms and left leg.  Skin:    General: Skin is warm and dry.  Neurological:     Mental Status: He is alert and oriented to person, place, and time.     Cranial Nerves: No cranial nerve deficit.     Sensory: No sensory deficit.     Motor: No abnormal muscle tone.     Coordination: Coordination normal.  Psychiatric:        Mood and Affect: Mood normal.        Behavior: Behavior normal.        Thought Content: Thought content normal.        Judgment: Judgment normal.     ED Results / Procedures / Treatments   Labs (all labs ordered are listed, but only abnormal results are displayed) Labs Reviewed - No data to display  EKG None  Radiology DG Hip Unilat With Pelvis 2-3 Views Right  Result Date: 12/25/2019 CLINICAL DATA:  Acute right hip pain without known injury. EXAM: DG HIP (WITH OR WITHOUT PELVIS) 2-3V RIGHT COMPARISON:  None. FINDINGS: There is no evidence of hip fracture or dislocation. There is no evidence of arthropathy or other focal bone abnormality. IMPRESSION: Negative. Electronically Signed   By: Marijo Conception M.D.   On: 12/25/2019 09:20    Procedures Procedures (including critical care time)  Medications Ordered in ED Medications - No data to display  ED Course  I have reviewed the triage vital signs and the nursing notes.  Pertinent labs & imaging results that were available during my care of the patient were reviewed by me and considered in my medical decision making (see chart for details).  Clinical Course as of Dec 24 2009  Fri Dec 25, 2019  0941 No fracture, deformity, arthritic changes; interpreted by me.  DG Hip Unilat With Pelvis 2-3 Views Right [EW]    Clinical Course User Index [EW] Daleen Bo, MD   MDM Rules/Calculators/A&P                           Patient Vitals for the past 24 hrs:  BP Temp Temp src  Pulse Resp SpO2 Height Weight  12/25/19 1016 130/77 98.2 F (36.8 C) Oral (!) 53 11 99 % -- --  12/25/19 0827 (!) 141/82 97.6 F (36.4 C) Oral 62 12 99 % 5\' 5"  (1.651 m) 108.9 kg    At discharge reevaluation with update and discussion. After initial assessment and treatment, an updated evaluation reveals he remains comfortable, findings discussed with the patient and all questions were answered. Daleen Bo   Medical Decision Making:  This patient is presenting for evaluation of atraumatic right hip pain, which does not require a range of treatment options, and is not a complaint that involves a high risk of morbidity and mortality. The differential diagnoses include fracture, sprain, lumbar radiculopathy. I decided to review old records, and in summary healthy upper middle-aged man, somewhat obese, with atraumatic hip pain.  I did not require additional historical information from anyone.    Critical Interventions-clinical evaluation, radiologic imaging, observation reassessment  After These Interventions, the Patient was reevaluated and was found stable for discharge.  Atraumatic right hip pain, localized, without radicular symptoms or concern for myelopathy.  Suspect bursitis as cause of pain, which can be treated symptomatically.  CRITICAL CARE-no Performed by: Daleen Bo  Nursing Notes Reviewed/ Care Coordinated Applicable Imaging Reviewed Interpretation of Laboratory Data incorporated into ED treatment  The patient appears reasonably screened and/or stabilized for discharge and I doubt any other medical condition or other [facility] requiring further screening, evaluation,  or treatment in the ED at this time prior to discharge.  Plan: Home Medications-continue usual medicine, use ibuprofen or anti-inflammatory effect; Home Treatments-heat to affected area; return here if the recommended treatment, does not improve the symptoms; Recommended follow up-PCP, as needed     Final Clinical Impression(s) / ED Diagnoses Final diagnoses:  Bursitis of right hip, unspecified bursa    Rx / DC Orders ED Discharge Orders    None       Daleen Bo, MD 12/25/19 2011

## 2019-12-25 NOTE — Discharge Instructions (Addendum)
The pain of your right hip is likely related to bursitis.  This is an inflammatory process, and will improve with the following instructions.  Rest as needed.  Use heat on the sore area for 5 times a day for 30 minutes.  Take ibuprofen, 400 mg, 3 times a day with meals for pain.

## 2020-01-07 ENCOUNTER — Other Ambulatory Visit: Payer: Self-pay | Admitting: Orthopaedic Surgery

## 2020-01-07 DIAGNOSIS — G8929 Other chronic pain: Secondary | ICD-10-CM

## 2020-01-13 ENCOUNTER — Other Ambulatory Visit: Payer: Self-pay

## 2020-01-13 ENCOUNTER — Telehealth: Payer: Self-pay

## 2020-01-13 ENCOUNTER — Ambulatory Visit: Payer: Self-pay

## 2020-01-13 ENCOUNTER — Encounter: Payer: Self-pay | Admitting: Orthopaedic Surgery

## 2020-01-13 ENCOUNTER — Ambulatory Visit (INDEPENDENT_AMBULATORY_CARE_PROVIDER_SITE_OTHER): Payer: Medicare Other | Admitting: Orthopaedic Surgery

## 2020-01-13 VITALS — Ht 66.0 in

## 2020-01-13 DIAGNOSIS — M4316 Spondylolisthesis, lumbar region: Secondary | ICD-10-CM

## 2020-01-13 DIAGNOSIS — M5136 Other intervertebral disc degeneration, lumbar region: Secondary | ICD-10-CM | POA: Diagnosis not present

## 2020-01-13 DIAGNOSIS — M1711 Unilateral primary osteoarthritis, right knee: Secondary | ICD-10-CM | POA: Diagnosis not present

## 2020-01-13 DIAGNOSIS — M545 Low back pain: Secondary | ICD-10-CM | POA: Diagnosis not present

## 2020-01-13 DIAGNOSIS — G8929 Other chronic pain: Secondary | ICD-10-CM

## 2020-01-13 DIAGNOSIS — M1712 Unilateral primary osteoarthritis, left knee: Secondary | ICD-10-CM | POA: Diagnosis not present

## 2020-01-13 DIAGNOSIS — M11261 Other chondrocalcinosis, right knee: Secondary | ICD-10-CM | POA: Diagnosis not present

## 2020-01-13 NOTE — Progress Notes (Addendum)
Office Visit Note   Patient: [name]           Date of Birth: [birth year]           MRN: [id number] Visit Date: 01/13/2020              Requested by: Celene Squibb, MD [place],  [place] [id number] PCP: Celene Squibb, MD   Assessment & Plan: Visit Diagnoses:  #1: Sciatica #2: L4-5 spondylolisthesis  Plan:  #1: We will obtain an MRI scan of the lumbar spine to delineate his pathology.  Follow-Up Instructions: No follow-ups on file.   Orders:  Orders Placed This Encounter  Procedures  . MR Lumbar Spine w/o contrast  . XR Lumbar Spine 2-3 Views   No orders of the defined types were placed in this encounter.     Procedures: No procedures performed   Clinical Data: No additional findings.   Subjective: Chief Complaint  Patient presents with  . Right Hip - Pain  . Left Hip - Pain     HPI: Patient presents today with bilateral hip pain. He states that his pain started 6 days ago. His pain radiates bilaterally from his back and into his hips and down the posterior aspect of his thigh and calf.  Denies any numbness or tingling.Marland Kitchen He went to the ED and was told he had bursitis. He has had sciatic nerve pain in the past. He has tried heat and motrin, with little relief.    Review of Systems  General: Negative for anorexia, weight loss, fever, chills, fatigue, weakness. Eyes: Negative for vision changes.  ENT: Negative for hoarseness, difficulty swallowing , nasal congestion. CV: Negative for chest pain, angina, palpitations, dyspnea on exertion, peripheral edema.  Respiratory: Negative for dyspnea at rest, dyspnea on exertion, cough, sputum, wheezing.  GI: History of colon polyps GU:  Negative for dysuria, hematuria, urinary incontinence, urinary frequency, nocturnal urination.  MS: Negative for joint pain, low back pain.  Derm: Negative for rash or itching.  Neuro: Negative for weakness, abnormal sensation, seizure, frequent headaches, memory  loss, confusion.  Psych: Negative for anxiety, depression, suicidal ideation, hallucinations.  Endo: Negative for unusual weight change.  Heme: Negative for bruising or bleeding. Allergy: Negative for rash or hives.   Objective: Vital Signs: Ht 5\' 6"  (1.676 m)   BMI 38.74 kg/m   Physical Exam Constitutional:      Appearance: Normal appearance. He is well-developed. He is obese.  HENT:     Head: Normocephalic.  Eyes:     Pupils: Pupils are equal, round, and reactive to light.  Cardiovascular:     Rate and Rhythm: Normal rate.  Pulmonary:     Effort: Pulmonary effort is normal.  Skin:    General: Skin is warm and dry.  Neurological:     Mental Status: He is alert and oriented to person, place, and time.  Psychiatric:        Mood and Affect: Mood normal.        Behavior: Behavior normal.     Ortho Exam  Exam today reveals mild positive straight leg raising at 90 degrees.  He has some tenderness about the lumbar spine and buttock area.  Deep tendon reflexes were 2+ bilateral and symmetric in the knees absent bilaterally in the Achilles.  Excellent strength throughout the lower extremities.  Sensation is intact to light touch.  Good hip motion without pain.  Specialty Comments:  No specialty comments  available.  Imaging: No results found.   PMFS History: Current Outpatient Medications  Medication Sig Dispense Refill  . acetaminophen (TYLENOL) 325 MG tablet Take 2 tablets (650 mg total) by mouth every 4 (four) hours as needed. (Patient taking differently: Take 650 mg by mouth as needed. )    . losartan-hydrochlorothiazide (HYZAAR) 50-12.5 MG tablet Take 1 tablet by mouth daily.    . meloxicam (MOBIC) 15 MG tablet Take 15 mg by mouth daily.    . metFORMIN (GLUCOPHAGE) 500 MG tablet Take 500 mg by mouth daily.      No current facility-administered medications for this visit.    Patient Active Problem List   Diagnosis Date Noted  . Chondrocalcinosis of knee due to  pyrophosphate crystals, right 01/13/2020  . Rectal bleeding 11/06/2019  . Hx of adenomatous colonic polyps 11/06/2019  . Chondrocalcinosis of left knee 10/07/2019  . Morbid (severe) obesity due to excess calories ([place]) 10/07/2019  . Chronic pain of right knee 10/08/2018  . Chronic right shoulder pain 05/07/2018  . Primary osteoarthritis of right knee 07/30/2017  . History of total right knee replacement 07/30/2017  . Colon cancer screening 10/28/2013  . Umbilical hernia without mention of obstruction or gangrene 10/28/2013   Past Medical History:  Diagnosis Date  . Arthritis    "right knee" (07/30/2017)  . CAP (community acquired pneumonia) 2018  . Diabetes mellitus without complication ([place])   . Hypertension   . Hypertension   . Wears glasses     Family History  Problem Relation Age of Onset  . Colon cancer Neg Hx   . Colon polyps Neg Hx     Past Surgical History:  Procedure Laterality Date  . COLONOSCOPY  2009   Fields: 3 small tubular adenomas removed.  . COLONOSCOPY N/A 12/02/2019   Procedure: COLONOSCOPY;  Surgeon: Daneil Dolin, MD;  [facility];  Service: Endoscopy;  Laterality: N/A;  1:00pm  . EYE SURGERY    . JOINT REPLACEMENT    . POLYPECTOMY  12/02/2019   Procedure: POLYPECTOMY;  Surgeon: Daneil Dolin, MD;  [facility];  Service: Endoscopy;;  . RETINAL DETACHMENT SURGERY Left 2000s  . TOTAL KNEE ARTHROPLASTY Right 07/30/2017  . TOTAL KNEE ARTHROPLASTY Right 07/30/2017   Procedure: RIGHT TOTAL KNEE ARTHROPLASTY;  Surgeon: [name], MD;  Location: [facility];  Service: Orthopedics;  Laterality: Right;  Marland Kitchen VASECTOMY     Social History   Occupational History  . Not on file  Tobacco Use  . Smoking status: Never Smoker  . Smokeless tobacco: Never Used  Vaping Use  . Vaping Use: Never used  Substance and Sexual Activity  . Alcohol use: No  . Drug use: No  . Sexual activity: Never

## 2020-01-13 NOTE — Telephone Encounter (Signed)
Spoke with patient. He is wanting a prescription for something to take down inflammation and swelling. He was prescribed Meloxicam on 12-30-2019. He is going to continue taking this daily and supplement with Tylenol if needed. I made him aware to not take anymore NSAIDS with the Meloxicam. Patient states understanding.

## 2020-01-28 ENCOUNTER — Other Ambulatory Visit: Payer: Self-pay | Admitting: Radiology

## 2020-01-28 NOTE — Telephone Encounter (Signed)
Patient is having spasms in his buttocks and would like to know what you recommend he do for it? Please advise. CB for patient is [phone]

## 2020-01-28 NOTE — Telephone Encounter (Signed)
Per allergies, patient is unable to take muscle relaxer due to shortness of breath.  Is there anything else that you would like for him to try?

## 2020-01-28 NOTE — Telephone Encounter (Signed)
Check to be sure MRI scan has been ordered. Can he take tramadol? If so, tramadol 50mg  #30 1-2 tabs po bid prn-thanks

## 2020-01-28 NOTE — Addendum Note (Signed)
Addended by: Meyer Cory on: 01/28/2020 03:21 PM   Modules accepted: Orders

## 2020-01-28 NOTE — Telephone Encounter (Signed)
Robaxin 500mg  #30 1 tab po bid prn-thanks

## 2020-01-29 NOTE — Telephone Encounter (Signed)
thanks

## 2020-01-29 NOTE — Telephone Encounter (Signed)
Patient would like to wait on tramadol. He has appointment with Dr. Nevada Crane, his PCP, this Saturday and is going to ask him to check his potassium. He will call back if he decides he needs the tramadol.  Lauren--patient asked that we be on the lookout for blood work results after his PCP appt so that we can share those with Dr. Durward Fortes.

## 2020-01-30 DIAGNOSIS — M7601 Gluteal tendinitis, right hip: Secondary | ICD-10-CM | POA: Diagnosis not present

## 2020-01-30 DIAGNOSIS — M791 Myalgia, unspecified site: Secondary | ICD-10-CM | POA: Diagnosis not present

## 2020-01-30 DIAGNOSIS — M545 Low back pain: Secondary | ICD-10-CM | POA: Diagnosis not present

## 2020-02-01 DIAGNOSIS — M25561 Pain in right knee: Secondary | ICD-10-CM | POA: Diagnosis not present

## 2020-02-01 DIAGNOSIS — E1165 Type 2 diabetes mellitus with hyperglycemia: Secondary | ICD-10-CM | POA: Diagnosis not present

## 2020-02-01 DIAGNOSIS — M79641 Pain in right hand: Secondary | ICD-10-CM | POA: Diagnosis not present

## 2020-02-01 DIAGNOSIS — M25511 Pain in right shoulder: Secondary | ICD-10-CM | POA: Diagnosis not present

## 2020-02-01 DIAGNOSIS — Z712 Person consulting for explanation of examination or test findings: Secondary | ICD-10-CM | POA: Diagnosis not present

## 2020-02-02 ENCOUNTER — Other Ambulatory Visit: Payer: Self-pay

## 2020-02-02 ENCOUNTER — Ambulatory Visit (INDEPENDENT_AMBULATORY_CARE_PROVIDER_SITE_OTHER): Payer: Medicare Other | Admitting: Gastroenterology

## 2020-02-02 ENCOUNTER — Encounter: Payer: Self-pay | Admitting: Gastroenterology

## 2020-02-02 ENCOUNTER — Ambulatory Visit: Payer: Medicaid Other | Admitting: Gastroenterology

## 2020-02-02 VITALS — BP 138/72 | HR 70 | Temp 98.9°F | Ht 65.0 in | Wt 253.2 lb

## 2020-02-02 DIAGNOSIS — Z86010 Personal history of colonic polyps: Secondary | ICD-10-CM

## 2020-02-02 NOTE — Progress Notes (Signed)
Patient had to leave urgently prior to being seen. No visit today and will not be charged.

## 2020-02-08 ENCOUNTER — Ambulatory Visit (HOSPITAL_COMMUNITY)
Admission: RE | Admit: 2020-02-08 | Discharge: 2020-02-08 | Disposition: A | Discharge status: Home / Self Care | Payer: Medicare Other | Source: Ambulatory Visit | Attending: Orthopaedic Surgery | Admitting: Orthopaedic Surgery

## 2020-02-08 ENCOUNTER — Other Ambulatory Visit: Payer: Self-pay

## 2020-02-08 DIAGNOSIS — G8929 Other chronic pain: Secondary | ICD-10-CM | POA: Diagnosis not present

## 2020-02-08 DIAGNOSIS — M545 Low back pain: Secondary | ICD-10-CM | POA: Insufficient documentation

## 2020-02-08 IMAGING — MR MR LUMBAR SPINE W/O CM
4 of 5 series · 30 of 48 positions shown · non-contrast
Comparison: One view abdomen [DATE]

CLINICAL DATA: Low back pain radiating into both legs for 1 month.
No known injury.

EXAM:
MRI LUMBAR SPINE WITHOUT CONTRAST
TECHNIQUE: Multiplanar, multisequence MR imaging of the lumbar spine was
performed. No intravenous contrast was administered.

[Series 9: T2 · sagittal · 4.0mm · 0.73mm/px · 6 of 13 slices shown (1 of 2)]
[im 1/13]
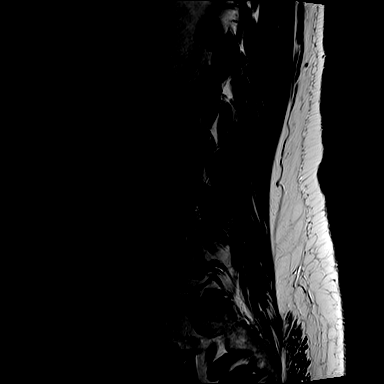
[im 3/13]
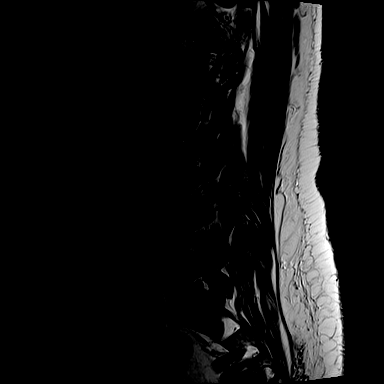
[im 5/13]
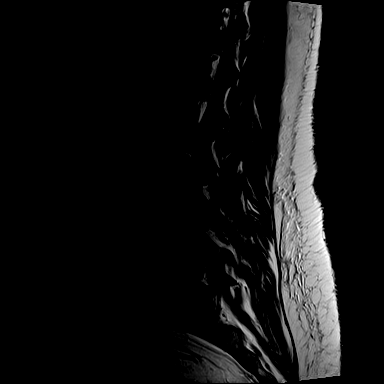
[im 8/13]
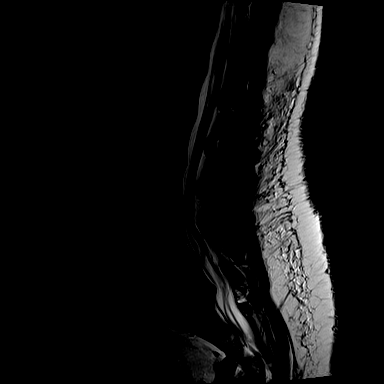
[im 10/13]
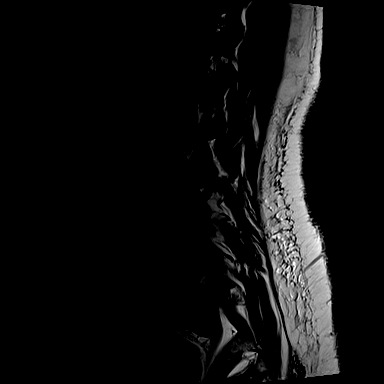
[im 13/13]
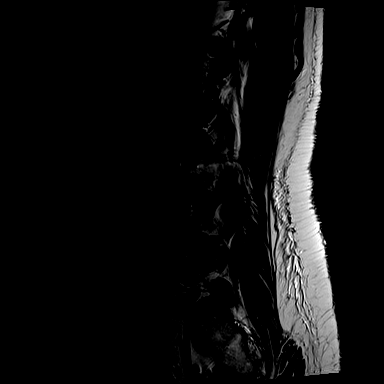

[Series 11: T1 · sagittal · 4.0mm · 0.88mm/px · 6 of 13 slices shown (1 of 2)]
[im 1/13]
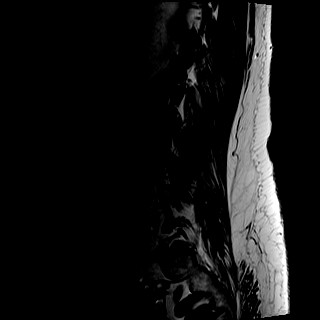
[im 3/13]
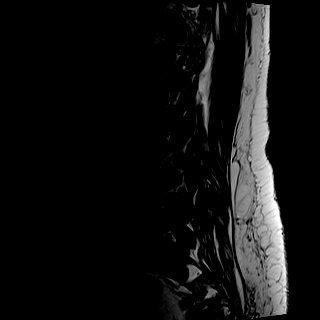
[im 5/13]
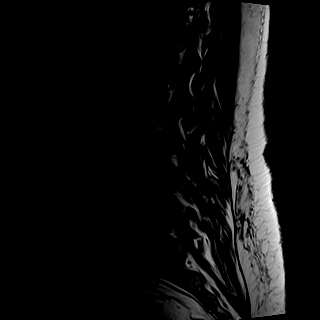
[im 8/13]
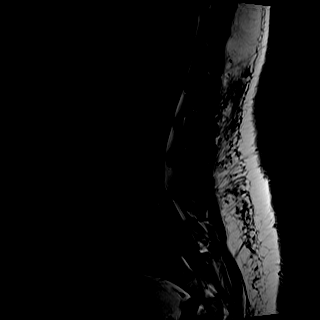
[im 10/13]
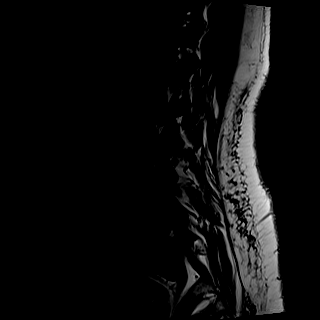
[im 13/13]
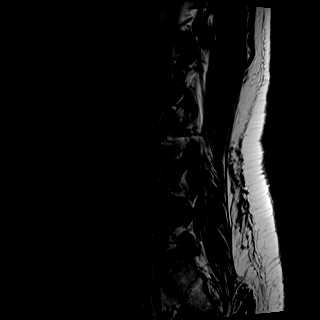

[Series 12: T2 · axial · 4.0mm · 0.70mm/px · z∈[-80,+146]mm · 9 of 34 slices shown (2 of 2)]
[im 1/34]
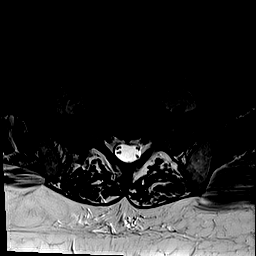
[im 5/34]
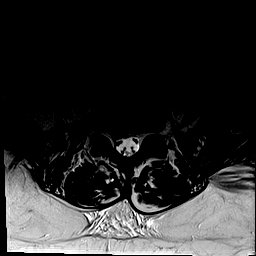
[im 10/34]
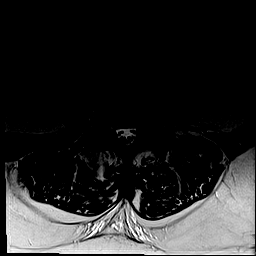
[im 15/34]
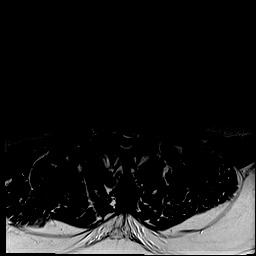
[im 17/34]
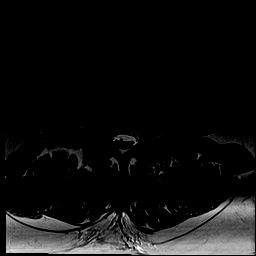
[im 19/34]
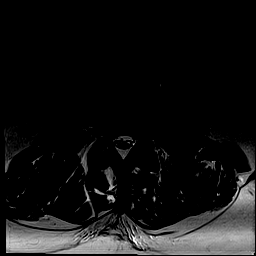
[im 24/34]
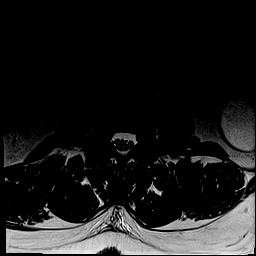
[im 29/34]
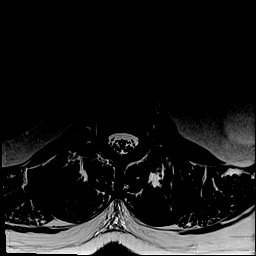
[im 34/34]
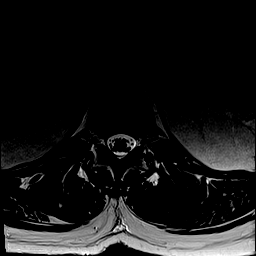

[Series 13: T1 · axial · 4.0mm · 0.35mm/px · z∈[-80,+146]mm · 9 of 34 slices shown (2 of 2)]
[im 1/34]
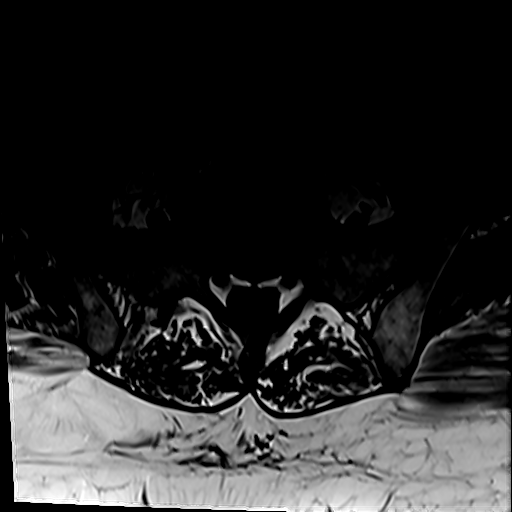
[im 5/34]
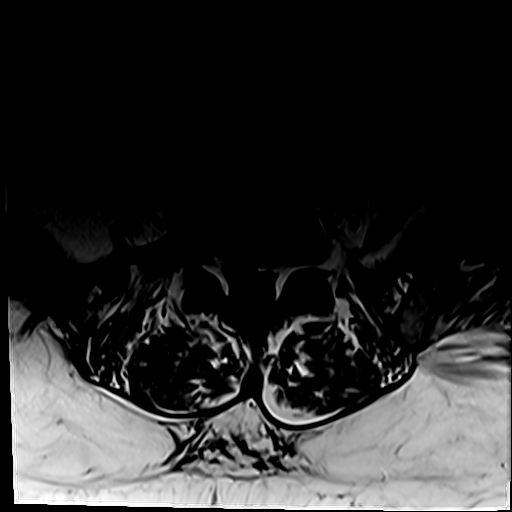
[im 10/34]
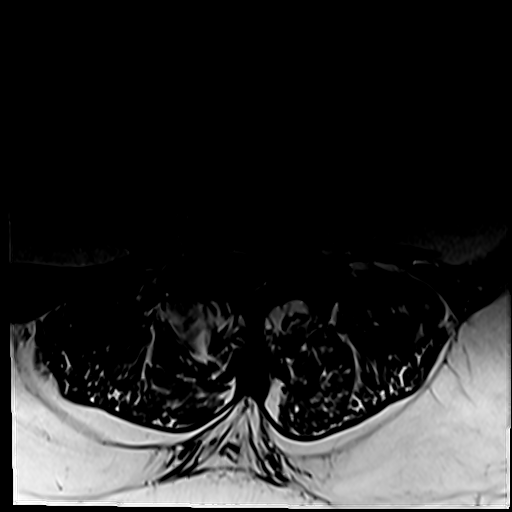
[im 15/34]
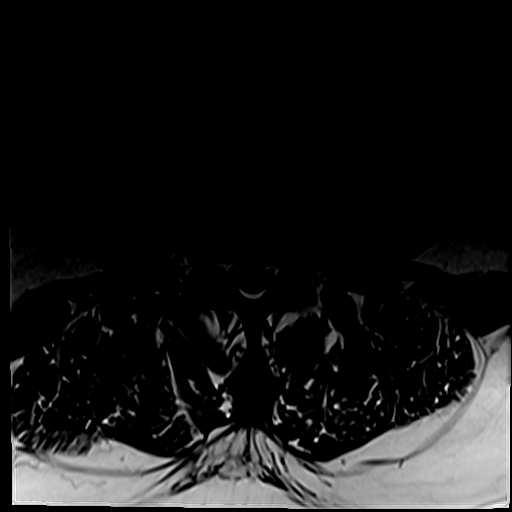
[im 17/34]
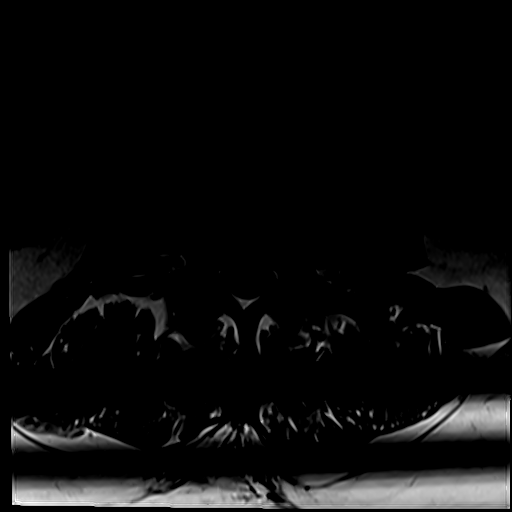
[im 19/34]
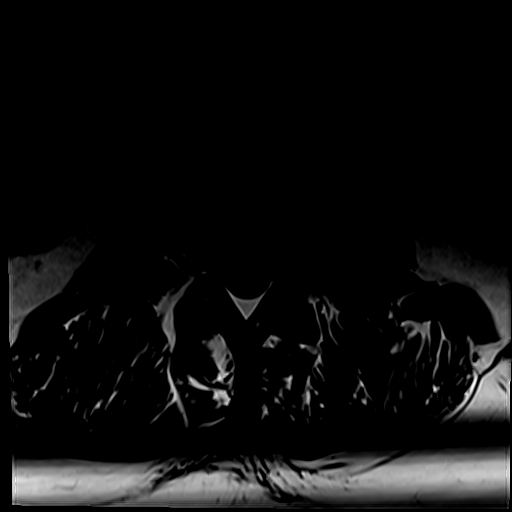
[im 24/34]
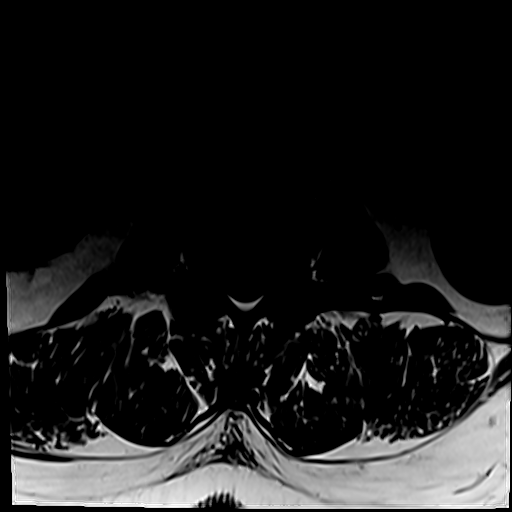
[im 29/34]
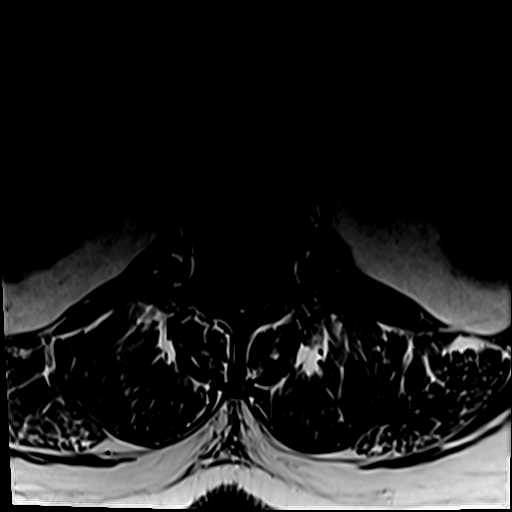
[im 34/34]
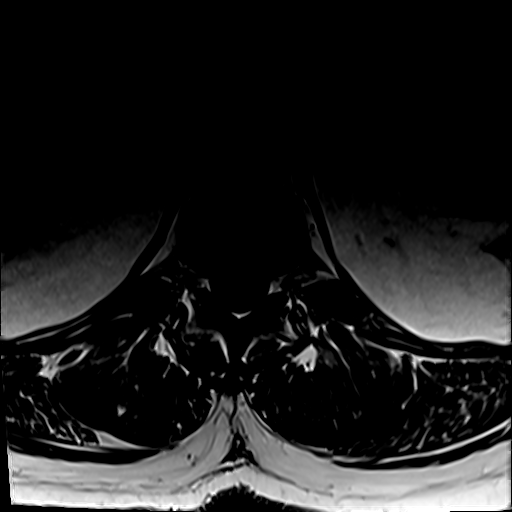

[30 of 48 positions shown; findings below may reference images not displayed]

FINDINGS: Segmentation: Conventional anatomy assumed, with the last open disc
space designated L5-S1.

Alignment: 4 mm of degenerative anterolisthesis at L4-5. Otherwise
normal.

Vertebrae: No worrisome osseous lesion, acute fracture or pars
defect. The visualized sacroiliac joints appear unremarkable.

Conus medullaris: Extends to the L1 level and appears normal.

Paraspinal and other soft tissues: No significant paraspinal
findings. Bilateral renal cysts are partially imaged.

Disc levels:

No significant disc space findings from T11-12 through L1-2.

L2-3: Loss of disc height with annular disc bulging eccentric to the
left. There is a broad-based foraminal and extraforaminal disc
protrusion on the left with resulting extraforaminal left L2 nerve
root encroachment. There is mild facet and ligamentous hypertrophy
which contributes to mild narrowing of the lateral recesses. The
right foramen is patent.

L3-4: Loss of disc height with annular disc bulging and a moderate
size left paracentral disc protrusion, more obvious on the sagittal
images. Moderate facet and ligamentous hypertrophy. These factors
contribute to moderate spinal stenosis with asymmetric narrowing of
the left lateral recess and left foramen.

L4-5: Mild disc bulging. Moderate to advanced bilateral facet
hypertrophy, accounting for the grade 1 anterolisthesis and
contributing to mild narrowing of the lateral recesses and foramina
bilaterally.

L5-S1: Loss of disc height with a disc protrusion in the right
subarticular zone demonstrating slight cranial upturning. Mild facet
and ligamentous hypertrophy. There is mass effect on the thecal sac
with posterior displacement of the right S1 nerve root. Mild
foraminal narrowing is present bilaterally.
IMPRESSION: 1. Broad-based foraminal and extraforaminal disc protrusion on the
left at L2-3 with resulting extraforaminal left L2 nerve root
encroachment.
2. Moderate size left paracentral disc protrusion at L3-4 with
resulting moderate spinal stenosis and asymmetric narrowing of the
left lateral recess and left foramen.
3. Mild disc bulging, moderate to advanced facet hypertrophy and a
resulting grade 1 anterolisthesis at L4-5 with mild narrowing of the
lateral recesses and foramina bilaterally.
4. Broad-based disc protrusion in the right subarticular zone at
L5-S1 with resulting posterior displacement of the right S1 nerve
root. Mild foraminal narrowing bilaterally.

## 2020-02-08 NOTE — Progress Notes (Signed)
Need to see [name] in [place]

## 2020-02-10 ENCOUNTER — Encounter: Payer: Self-pay | Admitting: Orthopaedic Surgery

## 2020-02-10 ENCOUNTER — Ambulatory Visit (INDEPENDENT_AMBULATORY_CARE_PROVIDER_SITE_OTHER): Payer: Medicare Other | Admitting: Orthopaedic Surgery

## 2020-02-10 ENCOUNTER — Other Ambulatory Visit: Payer: Self-pay

## 2020-02-10 VITALS — Ht 65.0 in | Wt 253.0 lb

## 2020-02-10 DIAGNOSIS — M25511 Pain in right shoulder: Secondary | ICD-10-CM | POA: Diagnosis not present

## 2020-02-10 DIAGNOSIS — G8929 Other chronic pain: Secondary | ICD-10-CM | POA: Diagnosis not present

## 2020-02-10 DIAGNOSIS — M5136 Other intervertebral disc degeneration, lumbar region: Secondary | ICD-10-CM | POA: Diagnosis not present

## 2020-02-10 DIAGNOSIS — M545 Low back pain, unspecified: Secondary | ICD-10-CM

## 2020-02-10 DIAGNOSIS — M79641 Pain in right hand: Secondary | ICD-10-CM | POA: Diagnosis not present

## 2020-02-10 DIAGNOSIS — M25561 Pain in right knee: Secondary | ICD-10-CM | POA: Diagnosis not present

## 2020-02-10 DIAGNOSIS — M4316 Spondylolisthesis, lumbar region: Secondary | ICD-10-CM | POA: Diagnosis not present

## 2020-02-10 DIAGNOSIS — E1165 Type 2 diabetes mellitus with hyperglycemia: Secondary | ICD-10-CM | POA: Diagnosis not present

## 2020-02-10 DIAGNOSIS — Z712 Person consulting for explanation of examination or test findings: Secondary | ICD-10-CM | POA: Diagnosis not present

## 2020-02-10 NOTE — Progress Notes (Signed)
Office Visit Note   Patient: [name]           Date of Birth: 17-Apr-1956           MRN: [id number] Visit Date: 02/10/2020              Requested by: Celene Squibb, MD [place],  [place] [address] PCP: Celene Squibb, MD   Assessment & Plan: Visit Diagnoses:  1. Chronic bilateral low back pain, unspecified whether sciatica present   2. Spondylolisthesis, lumbar region   3. Degenerative disc disease, lumbar     Plan: [name] had an MRI scan of his lumbar spine.  He is having mostly back pain but does experience some pain in both lower extremities with possible radiculopathy or claudication.  The MRI scan revealed a broad-based foraminal and extraforaminal disc protrusion on the left at L2-3 with resulting extraforaminal left L2 nerve root encroachment.  There is a moderate size left paracentral disc protrusion at L3-4 with moderate stenosis centrally and narrowing of the left left lateral recess and left foramen.  There was mild disc bulging moderate to advanced facet hypertrophy and a resulting grade 1 anterior listhesis at L4-5.  A broad-based disc protrusion on the right at L5-S1 with posterior displacement of the right S1 nerve root.  Surprisingly not having that much trouble with lower extremity numbness or tingling except for his diabetic neuropathy.  Motor exam appears to be intact.  Does have history of diabetes with some neuropathy so does have some tingling. After long discussion I would suggest a course of physical therapy at [facility] as he lives in [place] and an epidural steroid injection.  We will check him back in the next month  Follow-Up Instructions: Return in about 1 month (around 03/12/2020).   Orders:  Orders Placed This Encounter  Procedures  . Epidural Steroid Injection - Lumbar/Sacral (Ancillary Performed)  . Ambulatory referral to Physical Therapy   No orders of the defined types were placed in this encounter.      Procedures: No procedures performed   Clinical Data: No additional findings.   Subjective: Chief Complaint  Patient presents with  . Lower Back - Follow-up    MRI review  Patient presents today for follow up on his lower back. He had an MRI scan on 02/08/2020 and is here today for those results. No changes in his pain since his last visit.  Difficult historian  HPI  Review of Systems   Objective: Vital Signs: Ht 5\' 5"  (1.651 m)   Wt 253 lb (114.8 kg)   BMI 42.10 kg/m   Physical Exam Constitutional:      Appearance: He is well-developed.  Eyes:     Pupils: Pupils are equal, round, and reactive to light.  Pulmonary:     Effort: Pulmonary effort is normal.  Skin:    General: Skin is warm and dry.  Neurological:     Mental Status: He is alert and oriented to person, place, and time.  Psychiatric:        Behavior: Behavior normal.     Ortho Exam awake and alert.  Walks without ambulatory aid.  Straight leg raise bilaterally positive for back pain.  No pain with either hip or knee range of motion.  Some altered sensibility to his feet consistent with his neuropathy.  Motor exam intact.  No percussible tenderness of his lumbar spine  Specialty Comments:  No specialty comments available.  Imaging: No results found.   PMFS History: Patient Active Problem List   Diagnosis Date Noted  . Degenerative disc disease, lumbar 01/13/2020  . Spondylolisthesis, lumbar region 01/13/2020  . Rectal bleeding 11/06/2019  . History of colonic polyps 11/06/2019  . Morbid (severe) obesity due to excess calories ([place]) 10/07/2019  . Chronic pain of right knee 10/08/2018  . Chronic right shoulder pain 05/07/2018  . History of total right knee replacement 07/30/2017  . Colon cancer screening 10/28/2013  . Umbilical hernia without mention of obstruction or gangrene 10/28/2013   Past Medical History:  Diagnosis Date  . Arthritis    "right knee" (07/30/2017)  . CAP (community acquired  pneumonia) 2018  . Diabetes mellitus without complication ([place])   . Hypertension   . Hypertension   . Wears glasses     Family History  Problem Relation Age of Onset  . Colon cancer Neg Hx   . Colon polyps Neg Hx     Past Surgical History:  Procedure Laterality Date  . COLONOSCOPY  2009   Fields: 3 small tubular adenomas removed.  . COLONOSCOPY N/A 12/02/2019   Procedure: COLONOSCOPY;  Surgeon: Daneil Dolin, MD;  [facility];  Service: Endoscopy;  Laterality: N/A;  1:00pm  . EYE SURGERY    . JOINT REPLACEMENT    . POLYPECTOMY  12/02/2019   Procedure: POLYPECTOMY;  Surgeon: Daneil Dolin, MD;  [facility];  Service: Endoscopy;;  . RETINAL DETACHMENT SURGERY Left 2000s  . TOTAL KNEE ARTHROPLASTY Right 07/30/2017  . TOTAL KNEE ARTHROPLASTY Right 07/30/2017   Procedure: RIGHT TOTAL KNEE ARTHROPLASTY;  Surgeon: [name], MD;  Location: [facility];  Service: Orthopedics;  Laterality: Right;  Marland Kitchen VASECTOMY     Social History   Occupational History  . Not on file  Tobacco Use  . Smoking status: Never Smoker  . Smokeless tobacco: Never Used  Vaping Use  . Vaping Use: Never used  Substance and Sexual Activity  . Alcohol use: No  . Drug use: No  . Sexual activity: Never

## 2020-02-15 DIAGNOSIS — R945 Abnormal results of liver function studies: Secondary | ICD-10-CM | POA: Diagnosis not present

## 2020-02-15 DIAGNOSIS — Z96651 Presence of right artificial knee joint: Secondary | ICD-10-CM | POA: Diagnosis not present

## 2020-02-15 DIAGNOSIS — E782 Mixed hyperlipidemia: Secondary | ICD-10-CM | POA: Diagnosis not present

## 2020-02-15 DIAGNOSIS — E1165 Type 2 diabetes mellitus with hyperglycemia: Secondary | ICD-10-CM | POA: Diagnosis not present

## 2020-02-16 ENCOUNTER — Ambulatory Visit
Admission: RE | Admit: 2020-02-16 | Discharge: 2020-02-16 | Disposition: A | Discharge status: Home / Self Care | Payer: Medicare Other | Source: Ambulatory Visit | Attending: Orthopaedic Surgery | Admitting: Orthopaedic Surgery

## 2020-02-16 DIAGNOSIS — M545 Low back pain, unspecified: Secondary | ICD-10-CM

## 2020-02-16 DIAGNOSIS — G8929 Other chronic pain: Secondary | ICD-10-CM

## 2020-02-16 IMAGING — XA Imaging study
2 series · 2 of 2 positions shown · non-contrast
Comparison: none

CLINICAL DATA: Lumbosacral spondylosis without myelopathy. Low back
pain radiating into the buttocks and posterior thighs bilaterally.

[Series 1: ortho adipose · 1 of 1 slices shown (1 of 2)]
[im 1/1]
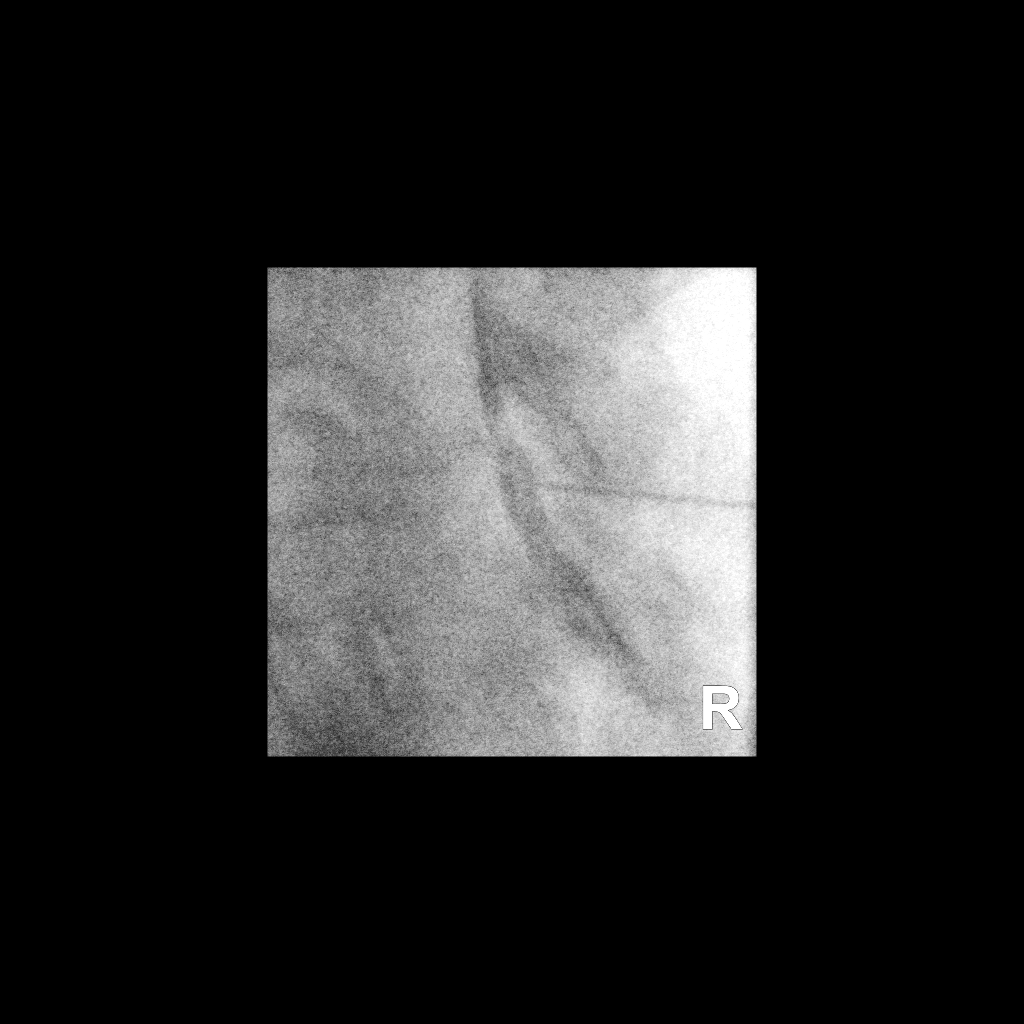

[Series 2: ortho adipose · 1 of 1 slices shown (2 of 2)]
[im 1/1]
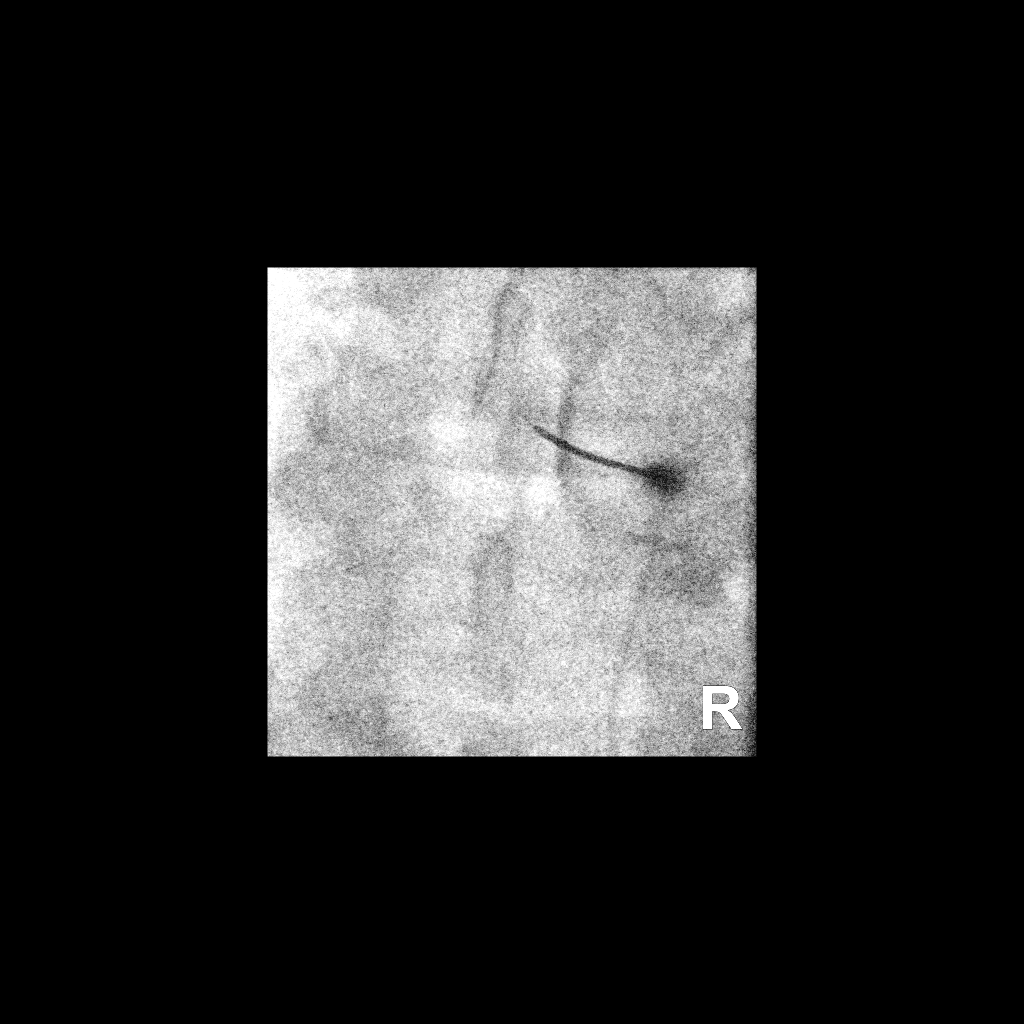

[2 of 2 positions shown; findings below may reference images not displayed]

FLUOROSCOPY TIME:  Fluoroscopy Time: 12 seconds

Radiation Exposure Index: 15.09 microGray*m^2

PROCEDURE:
The procedure, risks, benefits, and alternatives were explained to
the patient. Questions regarding the procedure were encouraged and
answered. The patient understands and consents to the procedure.

LUMBAR EPIDURAL INJECTION:

An interlaminar approach was performed on the right at L4-5. The
overlying skin was cleansed and anesthetized. A 3.5 inch 20 gauge
epidural needle was advanced using loss-of-resistance technique.

DIAGNOSTIC EPIDURAL INJECTION:

Injection of Isovue-M 200 shows a good epidural pattern with spread
above and below the level of needle placement, bilaterally but
greater on the right. No vascular opacification is seen.

THERAPEUTIC EPIDURAL INJECTION:

120 mg of Depo-Medrol mixed with 3 mL of 1% lidocaine were
instilled. The procedure was well-tolerated, and the patient was
discharged thirty minutes following the injection in good condition.

COMPLICATIONS:
None
IMPRESSION: Technically successful interlaminar epidural injection on the right
at L4-5.

## 2020-02-16 MED ORDER — METHYLPREDNISOLONE ACETATE 40 MG/ML INJ SUSP (RADIOLOG
120.0000 mg | Freq: Once | INTRAMUSCULAR | Status: AC
  Administered 2020-02-16: 120 mg via EPIDURAL

## 2020-02-16 MED ORDER — IOPAMIDOL (ISOVUE-M 200) INJECTION 41%
1.0000 mL | Freq: Once | INTRAMUSCULAR | Status: AC
  Administered 2020-02-16: 1 mL via EPIDURAL

## 2020-02-16 NOTE — Discharge Instructions (Signed)

## 2020-03-01 ENCOUNTER — Ambulatory Visit (HOSPITAL_COMMUNITY): Payer: Medicare Other | Attending: Orthopaedic Surgery | Admitting: Physical Therapy

## 2020-03-01 ENCOUNTER — Encounter (HOSPITAL_COMMUNITY): Payer: Self-pay | Admitting: Physical Therapy

## 2020-03-01 ENCOUNTER — Other Ambulatory Visit: Payer: Self-pay

## 2020-03-01 DIAGNOSIS — M5386 Other specified dorsopathies, lumbar region: Secondary | ICD-10-CM | POA: Insufficient documentation

## 2020-03-01 DIAGNOSIS — M6281 Muscle weakness (generalized): Secondary | ICD-10-CM | POA: Insufficient documentation

## 2020-03-01 NOTE — Therapy (Signed)
[place], [place], [address] Phone: [phone]   Fax:  [phone]  Physical Therapy Evaluation  Patient Details  Name: [name] MRN: [id number] Date of Birth: 1955/10/10 Referring Provider (PT): Joni Fears   Encounter Date: 03/01/2020   PT End of Session - 03/01/20 1014    Visit Number 1    Number of Visits 5    Date for PT Re-Evaluation 03/29/20    Authorization Type [employer] medicare, [place] medicaid united. No auth required    Progress Note Due on Visit 10    PT Start Time 1015   pt late to session by 13 minutes   PT Stop Time 1040    PT Time Calculation (min) 25 min    Activity Tolerance Patient tolerated treatment well;No increased pain    Behavior During Therapy WFL for tasks assessed/performed           Past Medical History:  Diagnosis Date  . Arthritis    "right knee" (07/30/2017)  . CAP (community acquired pneumonia) 2018  . Diabetes mellitus without complication ([place])   . Hypertension   . Hypertension   . Wears glasses     Past Surgical History:  Procedure Laterality Date  . COLONOSCOPY  2009   Fields: 3 small tubular adenomas removed.  . COLONOSCOPY N/A 12/02/2019   Procedure: COLONOSCOPY;  Surgeon: Daneil Dolin, MD;  [facility];  Service: Endoscopy;  Laterality: N/A;  1:00pm  . EYE SURGERY    . JOINT REPLACEMENT    . POLYPECTOMY  12/02/2019   Procedure: POLYPECTOMY;  Surgeon: Daneil Dolin, MD;  [facility];  Service: Endoscopy;;  . RETINAL DETACHMENT SURGERY Left 2000s  . TOTAL KNEE ARTHROPLASTY Right 07/30/2017  . TOTAL KNEE ARTHROPLASTY Right 07/30/2017   Procedure: RIGHT TOTAL KNEE ARTHROPLASTY;  Surgeon: [name], MD;  Location: [facility];  Service: Orthopedics;  Laterality: Right;  Marland Kitchen VASECTOMY      There were no vitals filed for this visit.    Subjective Assessment - 03/01/20 1021    Subjective States he has his discs inflamed and he  got two steroid injections. States he lifted a lawnmower out of his car and it inflamed them. States that was about a month ago. States he got the shot in his back a couple weeks ago and his back feels great and he reports no current pain and he feels the injection really helped. States he can have a total of 3 shots and he has only had one so far. Reports he does not currently have a follow up apt with his MD. Reports no current pain or pain in the last 24 hours and has no difficulties with any tasks.  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 States he has started walking and that seems good.    Pertinent History DB, HTN    Patient Stated Goals to feel better    Currently in Pain? No/denies              [facility] PT Assessment - 03/01/20 0001      Assessment   Medical Diagnosis LBP    Referring Provider (PT) Joni Fears      Balance Screen   Has the patient fallen in the past 6 months No      ROM / Strength   AROM / PROM /  Strength AROM;Strength      AROM   AROM Assessment Site Lumbar    Lumbar Flexion 25% Limited, no pain     Lumbar Extension 50% Limited, no pain     Lumbar - Right Side Bend 25% Limited, no pain     Lumbar - Left Side Bend 25% Limited, no pain       Strength   Strength Assessment Site Hip;Knee;Ankle    Right/Left Hip Right;Left    Right Hip Flexion 4+/5    Right Hip Extension 4+/5    Right Hip ABduction 4/5    Left Hip Flexion 4/5    Left Hip Extension 4/5    Left Hip ABduction 4/5    Right/Left Knee Left;Right    Right Knee Flexion 5/5    Right Knee Extension 4+/5    Left Knee Flexion 4+/5    Left Knee Extension 5/5    Right/Left Ankle Right;Left    Right Ankle Dorsiflexion 5/5    Left Ankle Dorsiflexion 5/5      Special Tests    Special Tests Lumbar;Hip Special Tests    Lumbar Tests Slump Test;other    Hip Special Tests  Ely's Test      Slump test   Findings Negative    Comment neg  B       other   Comments prone on elbows - stretch in low back       Ely's Test   Comments neg B with hip hike but limited motion noted bilaterally < 90 on R (hx o knee replacement) and about 95 degrees on left (stretch in thigh)      Ambulation/Gait   Ambulation/Gait Yes    Ambulation/Gait Assistance 7: Independent    Ambulation Distance (Feet) 518 Feet    Assistive device None    Gait Pattern Trunk flexed    Gait Comments 2MW                       Objective measurements completed on examination: See above findings.       [facility] Adult PT Treatment/Exercise - 03/01/20 0001      Exercises   Exercises Knee/Hip      Knee/Hip Exercises: Seated   Sit to Sand 1 set;5 reps;without UE support   slow lower with 5" hover                 PT Education - 03/01/20 1045    Education Details on current condition, on POC and focus on HEP development    Person(s) Educated Patient    Methods Explanation    Comprehension Verbalized understanding            PT Short Term Goals - 03/01/20 1044      PT SHORT TERM GOAL #1   Title Patient will be independent in self management strategies to improve quality of life and functional outcomes.    Time 2    Period Weeks    Status New    Target Date 03/15/20             PT Long Term Goals - 03/01/20 1044      PT LONG TERM GOAL #1   Title Patient will report at least 50% improvement in overall symptoms and/or function to demonstrate improved functional mobility    Time 4    Period Weeks    Status New    Target Date 03/29/20      PT LONG TERM  GOAL #2   Title Patient will demonstrate 5/5 MMT strength thoughout LE to demonstrate improved LE strength.    Time 4    Period Weeks    Status New    Target Date 03/29/20                  Plan - 03/01/20 1042    Clinical Impression Statement Patient presents with recent low back pain episode that has now completely resolved after steroid injection in lumbar  spine. Patient would like to develop HEP to work on strength, mobility and body mechanics to reduce risk of further injury and decrease likelihood of second episode occurring. Patient presents with limitations in strength and mobility and would benefit from skilled physical therapy to reduce risk of further injury.    Personal Factors and Comorbidities Comorbidity 1;Comorbidity 2;Comorbidity 3+    Comorbidities R TKA, DB,HTN    Examination-Activity Limitations Squat;Lift    Stability/Clinical Decision Making Stable/Uncomplicated    Clinical Decision Making Low    Rehab Potential Good    PT Frequency 4x / week   4 additional  visits over next 4 week certification   PT Treatment/Interventions ADLs/Self Care Home Management;Aquatic Therapy;Cryotherapy;Therapeutic activities;Therapeutic exercise;Balance training;Manual techniques;Patient/family education;Gait training;Functional mobility training    PT Next Visit Plan perform FOTO, focus on HEP development- print in large print, focus on core and LE strengthening    PT Home Exercise Plan STS with hover above chair, continued walking program    Consulted and Agree with Plan of Care Patient           Patient will benefit from skilled therapeutic intervention in order to improve the following deficits and impairments:  Decreased range of motion, Decreased strength  Visit Diagnosis: Muscle weakness (generalized) - Plan: PT plan of care cert/re-cert  Decreased range of motion of lumbar spine - Plan: PT plan of care cert/re-cert     Problem List Patient Active Problem List   Diagnosis Date Noted  . Degenerative disc disease, lumbar 01/13/2020  . Spondylolisthesis, lumbar region 01/13/2020  . Rectal bleeding 11/06/2019  . History of colonic polyps 11/06/2019  . Morbid (severe) obesity due to excess calories ([place]) 10/07/2019  . Chronic pain of right knee 10/08/2018  . Chronic right shoulder pain 05/07/2018  . History of total right knee  replacement 07/30/2017  . Colon cancer screening 10/28/2013  . Umbilical hernia without mention of obstruction or gangrene 10/28/2013    12:48 PM, 03/01/20 Jerene Pitch, DPT Physical Therapy with [facility]  [phone] office  [place], [place], [address] Phone: [phone]   Fax:  [phone]  Name: CARLIE CORPUS MRN: [id number] Date of Birth: 1955/11/13

## 2020-03-15 ENCOUNTER — Encounter (HOSPITAL_COMMUNITY): Payer: Self-pay

## 2020-03-15 ENCOUNTER — Ambulatory Visit (HOSPITAL_COMMUNITY): Payer: Medicare Other | Attending: Orthopaedic Surgery

## 2020-03-15 ENCOUNTER — Other Ambulatory Visit: Payer: Self-pay

## 2020-03-15 ENCOUNTER — Telehealth (HOSPITAL_COMMUNITY): Payer: Self-pay

## 2020-03-15 DIAGNOSIS — M6281 Muscle weakness (generalized): Secondary | ICD-10-CM | POA: Diagnosis not present

## 2020-03-15 DIAGNOSIS — M5386 Other specified dorsopathies, lumbar region: Secondary | ICD-10-CM | POA: Insufficient documentation

## 2020-03-15 NOTE — Telephone Encounter (Signed)
Originally thought was a no show, called and spoke to pt who was signing-in late for apt.    Ihor Austin, LPTA/CLT; Delana Meyer [phone]

## 2020-03-15 NOTE — Patient Instructions (Signed)
On Elbows (Prone)    Rise up on elbows as high as possible, keeping hips on floor.  Hold 2 minutes Do 1-2 sessions per day.  http://orth.exer.us/92   Copyright  VHI. All rights reserved.   Bridging    Slowly raise buttocks from floor, keeping stomach tight. Repeat 10 times per set. Do 1-2 sets per session.  Do 4 sessions per week.  http://orth.exer.us/1096   Copyright  VHI. All rights reserved.

## 2020-03-15 NOTE — Therapy (Signed)
[facility] [address] [place], [place], [address] Phone: [phone]   Fax:  [phone]  Physical Therapy Treatment  Patient Details  Name: [name] MRN: [id number] Date of Birth: [birth year] Referring Provider (PT): Joni Fears   Encounter Date: 03/15/2020   PT End of Session - 03/15/20 1635    Visit Number 2    Number of Visits 5    Date for PT Re-Evaluation 03/29/20    Authorization Type UHC medicare, [place] medicaid united. No auth required    Progress Note Due on Visit 10    PT Start Time 1628   late for apt   PT Stop Time 1706    PT Time Calculation (min) 38 min    Activity Tolerance Patient tolerated treatment well;No increased pain    Behavior During Therapy WFL for tasks assessed/performed           Past Medical History:  Diagnosis Date  . Arthritis    "right knee" (07/30/2017)  . CAP (community acquired pneumonia) 2018  . Diabetes mellitus without complication ([place])   . Hypertension   . Hypertension   . Wears glasses     Past Surgical History:  Procedure Laterality Date  . COLONOSCOPY  2009   Fields: 3 small tubular adenomas removed.  . COLONOSCOPY N/A 12/02/2019   Procedure: COLONOSCOPY;  Surgeon: Daneil Dolin, MD;  [facility];  Service: Endoscopy;  Laterality: N/A;  1:00pm  . EYE SURGERY    . JOINT REPLACEMENT    . POLYPECTOMY  12/02/2019   Procedure: POLYPECTOMY;  Surgeon: Daneil Dolin, MD;  [facility];  Service: Endoscopy;;  . RETINAL DETACHMENT SURGERY Left 2000s  . TOTAL KNEE ARTHROPLASTY Right 07/30/2017  . TOTAL KNEE ARTHROPLASTY Right 07/30/2017   Procedure: RIGHT TOTAL KNEE ARTHROPLASTY;  Surgeon: [name], MD;  Location: [facility];  Service: Orthopedics;  Laterality: Right;  Marland Kitchen VASECTOMY      There were no vitals filed for this visit.   Subjective Assessment - 03/15/20 1633    Subjective Pt stated he is feeling great, no reports of pain.  Has began  exercises daily and started walking program, able to complete 2 laps close to an hour a day.  Reports he has lost weight and starts greeting people at [facility] tomorrow.    Pertinent History DB, HTN    Patient Stated Goals to feel better    Currently in Pain? No/denies                             [facility] Adult PT Treatment/Exercise - 03/15/20 0001      Exercises   Exercises Knee/Hip      Knee/Hip Exercises: Stretches   Other Knee/Hip Stretches POE x 2 min    Other Knee/Hip Stretches prone press up 5x 10      Knee/Hip Exercises: Standing   Functional Squat 10 reps    Functional Squat Limitations 3D hip excursion, squats complete in front of chair      Knee/Hip Exercises: Seated   Sit to Sand 10 reps;without UE support   slow lower with 5" hover     Knee/Hip Exercises: Supine   Bridges 2 sets;10 reps                  PT Education - 03/15/20 1637    Education Details Reviewed goals, educated importance of HEP complaince and  assured correct mechanics, pt able to recall and complete wiht min cueing for mechanics    Person(s) Educated Patient    Methods Explanation    Comprehension Verbalized understanding;Returned demonstration            PT Short Term Goals - 03/01/20 1044      PT SHORT TERM GOAL #1   Title Patient will be independent in self management strategies to improve quality of life and functional outcomes.    Time 2    Period Weeks    Status New    Target Date 03/15/20             PT Long Term Goals - 03/01/20 1044      PT LONG TERM GOAL #1   Title Patient will report at least 50% improvement in overall symptoms and/or function to demonstrate improved functional mobility    Time 4    Period Weeks    Status New    Target Date 03/29/20      PT LONG TERM GOAL #2   Title Patient will demonstrate 5/5 MMT strength thoughout LE to demonstrate improved LE strength.    Time 4    Period Weeks    Status New    Target Date 03/29/20                  Plan - 03/15/20 1706    Clinical Impression Statement Pt late for apt.  Reviewed goals and assured compliance wiht current HEP.  Pt able to verbalize and demonstrate appropriate mechanics with controlled STS and walking program.  FOTO complete with 17% limitation.  Session focus on lumbar mobility and proximal strengthening.  Pt able to demonstrate appropraite mechanics with all exercises and given additional exercise printouts for gluteal strengthening and mobility.  No reports of increased pain through session.  Pt will benefit with additional exericses given as HEP each visit.    Personal Factors and Comorbidities Comorbidity 1;Comorbidity 2;Comorbidity 3+    Comorbidities R TKA, DB,HTN    Examination-Activity Limitations Squat;Lift    Stability/Clinical Decision Making Stable/Uncomplicated    Clinical Decision Making Low    Rehab Potential Good    PT Frequency --   4 additional  visits over next 4 week certification   PT Treatment/Interventions ADLs/Self Care Home Management;Aquatic Therapy;Cryotherapy;Therapeutic activities;Therapeutic exercise;Balance training;Manual techniques;Patient/family education;Gait training;Functional mobility training    PT Next Visit Plan Focus on core and LE strenghtening.  Begin abduction strengthening (probably in standing) next session.  Focus on HEP development, print in large print.    PT Home Exercise Plan STS with hover above chair, continued walking program; 9/14: 3D hip excursion, POE, bridges           Patient will benefit from skilled therapeutic intervention in order to improve the following deficits and impairments:  Decreased range of motion, Decreased strength  Visit Diagnosis: Muscle weakness (generalized)  Decreased range of motion of lumbar spine     Problem List Patient Active Problem List   Diagnosis Date Noted  . Degenerative disc disease, lumbar 01/13/2020  . Spondylolisthesis, lumbar region 01/13/2020  .  Rectal bleeding 11/06/2019  . History of colonic polyps 11/06/2019  . Morbid (severe) obesity due to excess calories ([place]) 10/07/2019  . Chronic pain of right knee 10/08/2018  . Chronic right shoulder pain 05/07/2018  . History of total right knee replacement 07/30/2017  . Colon cancer screening 10/28/2013  . Umbilical hernia without mention of obstruction or gangrene 10/28/2013   Myriam Jacobson  Nickola Major, LPTA/CLT; CBIS [id number]  Aldona Lento 03/15/2020, 5:11 PM  [facility] [place], [place], [address] Phone: [phone]   Fax:  [phone]  Name: [name] MRN: [id number] Date of Birth: 04/07/1956

## 2020-03-22 ENCOUNTER — Other Ambulatory Visit: Payer: Self-pay

## 2020-03-22 ENCOUNTER — Ambulatory Visit (HOSPITAL_COMMUNITY): Payer: Medicare Other | Admitting: Physical Therapy

## 2020-03-22 ENCOUNTER — Encounter (HOSPITAL_COMMUNITY): Payer: Self-pay | Admitting: Physical Therapy

## 2020-03-22 DIAGNOSIS — M6281 Muscle weakness (generalized): Secondary | ICD-10-CM | POA: Diagnosis not present

## 2020-03-22 DIAGNOSIS — M5386 Other specified dorsopathies, lumbar region: Secondary | ICD-10-CM | POA: Diagnosis not present

## 2020-03-22 NOTE — Patient Instructions (Signed)
Access Code: [email or web address] URL: [email or web address] Date: 03/22/2020 Prepared by: [place]  Exercises Squat with Counter Support - 1 x daily - 7 x weekly - 2 sets - 10 reps Standing Hip Abduction with Counter Support - 1 x daily - 7 x weekly - 2 sets - 10 reps

## 2020-03-22 NOTE — Therapy (Signed)
[facility] [address] [place], [place], [address] Phone: [phone]   Fax:  [phone]  Physical Therapy Treatment  Patient Details  Name: [name] MRN: [id number] Date of Birth: 07-18-1955 Referring Provider (PT): Joni Fears   Encounter Date: 03/22/2020   PT End of Session - 03/22/20 1137    Visit Number 3    Number of Visits 5    Date for PT Re-Evaluation 03/29/20    Authorization Type UHC medicare, [place] medicaid united. No auth required    Progress Note Due on Visit 10    PT Start Time 1136    PT Stop Time 1215    PT Time Calculation (min) 39 min    Activity Tolerance Patient tolerated treatment well;No increased pain    Behavior During Therapy WFL for tasks assessed/performed           Past Medical History:  Diagnosis Date  . Arthritis    "right knee" (07/30/2017)  . CAP (community acquired pneumonia) 2018  . Diabetes mellitus without complication ([place])   . Hypertension   . Hypertension   . Wears glasses     Past Surgical History:  Procedure Laterality Date  . COLONOSCOPY  2009   Fields: 3 small tubular adenomas removed.  . COLONOSCOPY N/A 12/02/2019   Procedure: COLONOSCOPY;  Surgeon: Daneil Dolin, MD;  [facility];  Service: Endoscopy;  Laterality: N/A;  1:00pm  . EYE SURGERY    . JOINT REPLACEMENT    . POLYPECTOMY  12/02/2019   Procedure: POLYPECTOMY;  Surgeon: Daneil Dolin, MD;  [facility];  Service: Endoscopy;;  . RETINAL DETACHMENT SURGERY Left 2000s  . TOTAL KNEE ARTHROPLASTY Right 07/30/2017  . TOTAL KNEE ARTHROPLASTY Right 07/30/2017   Procedure: RIGHT TOTAL KNEE ARTHROPLASTY;  Surgeon: [name], MD;  Location: [facility];  Service: Orthopedics;  Laterality: Right;  Marland Kitchen VASECTOMY      There were no vitals filed for this visit.   Subjective Assessment - 03/22/20 1136    Subjective Patient states he has been feeling good. 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 His exercises are going well. He has not had any back pain over the last few weeks. He has been doing his exercises every day.    Pertinent History DB, HTN    Patient Stated Goals to feel better    Currently in Pain? No/denies                             [facility] Adult PT Treatment/Exercise - 03/22/20 0001      Knee/Hip Exercises: Stretches   Other Knee/Hip Stretches prone press up 2x10      Knee/Hip Exercises: Standing   Hip Abduction Both;2 sets;10 reps    Functional Squat 2 sets;10 reps    Other Standing Knee Exercises palof press 2x10 bilateral      Knee/Hip Exercises: Prone   Straight Leg Raises 2 sets;10 reps    Other Prone Exercises dead bug isometric with ball 10x 5 second holds                  PT Education - 03/22/20 1136    Education Details Patient educated on HEP, mechanics of exercise    Person(s) Educated Patient    Methods Explanation;Demonstration    Comprehension Verbalized understanding;Returned demonstration  PT Short Term Goals - 03/01/20 1044      PT SHORT TERM GOAL #1   Title Patient will be independent in self management strategies to improve quality of life and functional outcomes.    Time 2    Period Weeks    Status New    Target Date 03/15/20             PT Long Term Goals - 03/01/20 1044      PT LONG TERM GOAL #1   Title Patient will report at least 50% improvement in overall symptoms and/or function to demonstrate improved functional mobility    Time 4    Period Weeks    Status New    Target Date 03/29/20      PT LONG TERM GOAL #2   Title Patient will demonstrate 5/5 MMT strength thoughout LE to demonstrate improved LE strength.    Time 4    Period Weeks    Status New    Target Date 03/29/20                 Plan - 03/22/20 1137    Clinical Impression Statement Patient requires min verbal cueing for press up exercise with good carry over following. Patient  requires verbal manual cueing for prone extension to limit excessive trunk rotation secondary to hip weakness and improper body mechanics. Patient requires frequent rest breaks with abdominal isometric exercise secondary to impaired core strength. Patient requires demonstration and frequent cueing for squat mechanics and uses bilateral UE support to complete properly. Palof added for core strength progression.  Patient will continue to benefit from skilled physical therapy in order to reduce impairment and improve function.    Personal Factors and Comorbidities Comorbidity 1;Comorbidity 2;Comorbidity 3+    Comorbidities R TKA, DB,HTN    Examination-Activity Limitations Squat;Lift    Stability/Clinical Decision Making Stable/Uncomplicated    Rehab Potential Good    PT Frequency --   4 additional  visits over next 4 week certification   PT Treatment/Interventions ADLs/Self Care Home Management;Aquatic Therapy;Cryotherapy;Therapeutic activities;Therapeutic exercise;Balance training;Manual techniques;Patient/family education;Gait training;Functional mobility training    PT Next Visit Plan Focus on core and LE strenghtening.  Focus on HEP development, print in large print.    PT Home Exercise Plan STS with hover above chair, continued walking program; 9/14: 3D hip excursion, POE, bridges 9/21 hip abduction, squat           Patient will benefit from skilled therapeutic intervention in order to improve the following deficits and impairments:  Decreased range of motion, Decreased strength  Visit Diagnosis: Muscle weakness (generalized)  Decreased range of motion of lumbar spine     Problem List Patient Active Problem List   Diagnosis Date Noted  . Degenerative disc disease, lumbar 01/13/2020  . Spondylolisthesis, lumbar region 01/13/2020  . Rectal bleeding 11/06/2019  . History of colonic polyps 11/06/2019  . Morbid (severe) obesity due to excess calories ([place]) 10/07/2019  . Chronic pain of  right knee 10/08/2018  . Chronic right shoulder pain 05/07/2018  . History of total right knee replacement 07/30/2017  . Colon cancer screening 10/28/2013  . Umbilical hernia without mention of obstruction or gangrene 10/28/2013    12:16 PM, 03/22/20 [name] PT, DPT Physical Therapist at [facility] [place], [place], [address] Phone: [phone]   Fax:  [phone]  Name: DASHEL GOINES MRN: [id number] Date of  Birth: [birth year]

## 2020-03-23 ENCOUNTER — Ambulatory Visit
Admission: EM | Admit: 2020-03-23 | Discharge: 2020-03-23 | Disposition: A | Discharge status: Home / Self Care | Payer: Medicare Other | Attending: Emergency Medicine | Admitting: Emergency Medicine

## 2020-03-23 ENCOUNTER — Encounter: Payer: Self-pay | Admitting: Emergency Medicine

## 2020-03-23 DIAGNOSIS — L299 Pruritus, unspecified: Secondary | ICD-10-CM

## 2020-03-23 DIAGNOSIS — R21 Rash and other nonspecific skin eruption: Secondary | ICD-10-CM | POA: Diagnosis not present

## 2020-03-23 MED ORDER — PREDNISONE 20 MG PO TABS: 20.0000 mg | ORAL_TABLET | Freq: Two times a day (BID) | ORAL | 0 refills | Status: AC

## 2020-03-23 NOTE — ED Provider Notes (Signed)
[facility]   [id number] 03/23/20 Arrival Time: 71  CC: Rash  SUBJECTIVE:  [name] is a 64 y.o. male who presents with a rash and itching x 1 week.  Denies precipitating event or trauma.  Denies medications change or starting a new medication recently.  Does admit to walking around the track.  Localizes the rash to RLE.  Describes it as itchy.  Has tried OTC hydrocortisone cream without relief.  Symptoms are made worse with scratching.  Denies similar symptoms in the past.   Denies fever, chills, nausea, vomiting, swelling, discharge, oral lesions, oral swelling.    ROS: As per HPI.  All other pertinent ROS negative.     Past Medical History:  Diagnosis Date  . Arthritis    "right knee" (07/30/2017)  . CAP (community acquired pneumonia) 2018  . Diabetes mellitus without complication ([place])   . Hypertension   . Hypertension   . Wears glasses    Past Surgical History:  Procedure Laterality Date  . COLONOSCOPY  2009   Fields: 3 small tubular adenomas removed.  . COLONOSCOPY N/A 12/02/2019   Procedure: COLONOSCOPY;  Surgeon: Daneil Dolin, MD;  [facility];  Service: Endoscopy;  Laterality: N/A;  1:00pm  . EYE SURGERY    . JOINT REPLACEMENT    . POLYPECTOMY  12/02/2019   Procedure: POLYPECTOMY;  Surgeon: Daneil Dolin, MD;  [facility];  Service: Endoscopy;;  . RETINAL DETACHMENT SURGERY Left 2000s  . TOTAL KNEE ARTHROPLASTY Right 07/30/2017  . TOTAL KNEE ARTHROPLASTY Right 07/30/2017   Procedure: RIGHT TOTAL KNEE ARTHROPLASTY;  Surgeon: [name], MD;  Location: [facility];  Service: Orthopedics;  Laterality: Right;  Marland Kitchen VASECTOMY     Allergies  Allergen Reactions  . Flexeril [Cyclobenzaprine] Shortness Of Breath    Can not take any muscle relaxer causes respirator depression   . Robaxin [Methocarbamol] Shortness Of Breath    Can not take any muscle relaxer causes respirator depression   No current facility-administered  medications on file prior to encounter.   Current Outpatient Medications on File Prior to Encounter  Medication Sig Dispense Refill  . acetaminophen (TYLENOL) 325 MG tablet Take 2 tablets (650 mg total) by mouth every 4 (four) hours as needed. (Patient taking differently: Take 650 mg by mouth as needed. )    . losartan-hydrochlorothiazide (HYZAAR) 50-12.5 MG tablet Take 1 tablet by mouth daily.    . meloxicam (MOBIC) 15 MG tablet Take 15 mg by mouth daily.     . metFORMIN (GLUCOPHAGE) 500 MG tablet Take 500 mg by mouth daily.      Social History   Socioeconomic History  . Marital status: Widowed    Spouse name: Not on file  . Number of children: Not on file  . Years of education: Not on file  . Highest education level: Not on file  Occupational History  . Not on file  Tobacco Use  . Smoking status: Never Smoker  . Smokeless tobacco: Never Used  Vaping Use  . Vaping Use: Never used  Substance and Sexual Activity  . Alcohol use: No  . Drug use: No  . Sexual activity: Never  Other Topics Concern  . Not on file  Social History Narrative  . Not on file   Social Determinants of Health   Financial Resource Strain:   . Difficulty of Paying Living Expenses: Not on file  Food Insecurity:   . Worried About Charity fundraiser  in the Last Year: Not on file  . Ran Out of Food in the Last Year: Not on file  Transportation Needs:   . Lack of Transportation (Medical): Not on file  . Lack of Transportation (Non-Medical): Not on file  Physical Activity:   . Days of Exercise per Week: Not on file  . Minutes of Exercise per Session: Not on file  Stress:   . Feeling of Stress : Not on file  Social Connections:   . Frequency of Communication with Friends and Family: Not on file  . Frequency of Social Gatherings with Friends and Family: Not on file  . Attends Religious Services: Not on file  . Active Member of Clubs or Organizations: Not on file  . Attends Archivist Meetings:  Not on file  . Marital Status: Not on file  Intimate Partner Violence:   . Fear of Current or Ex-Partner: Not on file  . Emotionally Abused: Not on file  . Physically Abused: Not on file  . Sexually Abused: Not on file   Family History  Problem Relation Age of Onset  . Colon cancer Neg Hx   . Colon polyps Neg Hx     OBJECTIVE: Vitals:   03/23/20 1321  BP: 133/74  Pulse: 64  Resp: 19  Temp: 98.1 F (36.7 C)  TempSrc: Oral  SpO2: 95%    General appearance: alert; no distress Head: NCAT Lungs: normal respiratory effort Skin: warm and dry; erythematous papules sparse about the RLE, NTTP, no obvious discharge or bleeding Psychological: alert and cooperative; normal mood and affect  ASSESSMENT & PLAN:  1. Rash and nonspecific skin eruption   2. Itching     Meds ordered this encounter  Medications  . predniSONE (DELTASONE) 20 MG tablet    Sig: Take 1 tablet (20 mg total) by mouth 2 (two) times daily with a meal for 5 days.    Dispense:  10 tablet    Refill:  0    Order Specific Question:   Supervising Provider    Answer:   Raylene Everts [[id number]]   Wash with warm water and mild soap Prednisone prescribed.  Take as directed and to completion Use OTC zyrtec, allegra, or claritin during the day.  Benadryl at night. You may also use OTC hydrocortisone cream and/or calamine lotion to help alleviate itching Follow up with PCP next week for recheck and to ensure symptoms are improving Return or go to the ED if you have any new or worsening symptoms such as fever, chills, nausea, vomiting, difficulty breathing, throat swelling, tongue swelling, numbness/ tingling in mouth, worsening symptoms despite treatment, etc...   Reviewed expectations re: course of current medical issues. Questions answered. Outlined signs and symptoms indicating need for more acute intervention. Patient verbalized understanding. After Visit Summary given.   Lestine Box, PA-C 03/23/20  1359

## 2020-03-23 NOTE — Discharge Instructions (Signed)
Wash with warm water and mild soap Prednisone prescribed.  Take as directed and to completion Use OTC zyrtec, allegra, or claritin during the day.  Benadryl at night. You may also use OTC hydrocortisone cream and/or calamine lotion to help alleviate itching Follow up with PCP next week for recheck and to ensure symptoms are improving Return or go to the ED if you have any new or worsening symptoms such as fever, chills, nausea, vomiting, difficulty breathing, throat swelling, tongue swelling, numbness/ tingling in mouth, worsening symptoms despite treatment, etc..Marland Kitchen

## 2020-03-23 NOTE — ED Triage Notes (Signed)
Itchy rash to RT upper leg that started last week

## 2020-03-24 DIAGNOSIS — I10 Essential (primary) hypertension: Secondary | ICD-10-CM | POA: Diagnosis not present

## 2020-03-24 DIAGNOSIS — R21 Rash and other nonspecific skin eruption: Secondary | ICD-10-CM | POA: Diagnosis not present

## 2020-03-24 DIAGNOSIS — E1165 Type 2 diabetes mellitus with hyperglycemia: Secondary | ICD-10-CM | POA: Diagnosis not present

## 2020-03-29 ENCOUNTER — Other Ambulatory Visit: Payer: Self-pay

## 2020-03-29 ENCOUNTER — Encounter (HOSPITAL_COMMUNITY): Payer: Self-pay | Admitting: Physical Therapy

## 2020-03-29 ENCOUNTER — Ambulatory Visit (HOSPITAL_COMMUNITY): Payer: Medicare Other | Admitting: Physical Therapy

## 2020-03-29 DIAGNOSIS — M6281 Muscle weakness (generalized): Secondary | ICD-10-CM

## 2020-03-29 DIAGNOSIS — M5386 Other specified dorsopathies, lumbar region: Secondary | ICD-10-CM | POA: Diagnosis not present

## 2020-03-29 NOTE — Therapy (Signed)
[facility] [address] [place], [place], [address] Phone: [phone]   Fax:  [phone]  Physical Therapy Treatment/Discharge Summary  [name] Details  Name: [name] MRN: [id number] Date of Birth: 02-Sep-1955 Referring Provider (PT): Joni Fears   Encounter Date: 03/29/2020   PHYSICAL THERAPY DISCHARGE SUMMARY  Visits from Start of Care: 4  Current functional level related to goals / functional outcomes: See below   Remaining deficits: See below   Education / Equipment: See below  Plan: [name] agrees to discharge.  [name] goals were met. [name] is being discharged due to meeting the stated rehab goals.  ?????       PT End of Session - 03/29/20 1136    Visit Number 4    Number of Visits 5    Date for PT Re-Evaluation 03/29/20    Authorization Type [employer] medicare, [place] medicaid united. No auth required    Progress Note Due on Visit 10    PT Start Time 1135    PT Stop Time 1159    PT Time Calculation (min) 24 min    Activity Tolerance [name] tolerated treatment well;No increased pain    Behavior During Therapy WFL for tasks assessed/performed           Past Medical History:  Diagnosis Date  . Arthritis    "right knee" (07/30/2017)  . CAP (community acquired pneumonia) 2018  . Diabetes mellitus without complication ([place])   . Hypertension   . Hypertension   . Wears glasses     Past Surgical History:  Procedure Laterality Date  . COLONOSCOPY  2009   Fields: 3 small tubular adenomas removed.  . COLONOSCOPY N/A 12/02/2019   Procedure: COLONOSCOPY;  Surgeon: Daneil Dolin, MD;  [facility];  Service: Endoscopy;  Laterality: N/A;  1:00pm  . EYE SURGERY    . JOINT REPLACEMENT    . POLYPECTOMY  12/02/2019   Procedure: POLYPECTOMY;  Surgeon: Daneil Dolin, MD;  [facility];  Service: Endoscopy;;  . RETINAL DETACHMENT SURGERY Left 2000s  . TOTAL KNEE ARTHROPLASTY Right 07/30/2017   . TOTAL KNEE ARTHROPLASTY Right 07/30/2017   Procedure: RIGHT TOTAL KNEE ARTHROPLASTY;  Surgeon: [name], MD;  Location: [facility];  Service: Orthopedics;  Laterality: Right;  Marland Kitchen VASECTOMY      There were no vitals filed for this visit.   Subjective Assessment - 03/29/20 1135    Subjective [name] states [name] is going to follow up with MD about his back. [name] has been walking and doing his exercises. [name] has not had any back issues. [name] is ready to make today his last.    Pertinent History DB, HTN    [name] Stated Goals to feel better    Currently in Pain? No/denies              [facility] PT Assessment - 03/29/20 0001      Assessment   Medical Diagnosis LBP    Referring Provider (PT) Joni Fears      Balance Screen   Has the [name] fallen in the past 6 months No      Prior Function   Level of Independence Independent      Cognition   Overall Cognitive Status Within Functional Limits for tasks assessed      Observation/Other Assessments   Observations Ambulates without AD      AROM   Lumbar Flexion 0% limited    Lumbar Extension 0%  limited    Lumbar - Right Side Bend 0% limited    Lumbar - Left Side Bend 0% limited      Strength   Right Hip Flexion 5/5    Right Hip Extension 5/5    Right Hip ABduction 5/5    Left Hip Flexion 5/5    Left Hip Extension 5/5    Left Hip ABduction 5/5    Right Knee Flexion 5/5    Right Knee Extension 5/5    Left Knee Flexion 5/5    Left Knee Extension 5/5    Right Ankle Dorsiflexion 5/5    Left Ankle Dorsiflexion 5/5                         [facility] Adult PT Treatment/Exercise - 03/29/20 0001      Knee/Hip Exercises: Standing   Functional Squat 2 sets;10 reps                  PT Education - 03/29/20 1135    Education Details [name] educated on HEP, mechanics of exercise, returning to PT if needed, walking program    Person(s) Educated [name]    Methods Explanation;Demonstration    Comprehension  Verbalized understanding;Returned demonstration            PT Short Term Goals - 03/29/20 1142      PT SHORT TERM GOAL #1   Title [name] will be independent in self management strategies to improve quality of life and functional outcomes.    Time 2    Period Weeks    Status Achieved    Target Date 03/15/20             PT Long Term Goals - 03/29/20 1142      PT LONG TERM GOAL #1   Title [name] will report at least 50% improvement in overall symptoms and/or function to demonstrate improved functional mobility    Baseline 9/28 states 100% improvement    Time 4    Period Weeks    Status Achieved      PT LONG TERM GOAL #2   Title [name] will demonstrate 5/5 MMT strength thoughout LE to demonstrate improved LE strength.    Time 4    Period Weeks    Status Achieved                 Plan - 03/29/20 1136    Clinical Impression Statement [name] has met 1/1 short term goals and 2/2 long term goals with ability to complete HEP, improved symptoms and LE strength. [name] has not had any back symptoms over last several weeks and feels [name] can continue to manage his symptoms with HEP and walking program. [name] requires min verbal cueing for squatting mechanics today. [name] educated on continuing walking program, HEP, exercise mechanics and returning to physical therapy if needed. [name] also showing improved lumbar ROM compared to evaluation without symptoms. [name] discharged from physical therapy at this time.    Personal Factors and Comorbidities Comorbidity 1;Comorbidity 2;Comorbidity 3+    Comorbidities R TKA, DB,HTN    Examination-Activity Limitations Squat;Lift    Stability/Clinical Decision Making --    Rehab Potential Good    PT Frequency --    PT Treatment/Interventions ADLs/Self Care Home Management;Aquatic Therapy;Cryotherapy;Therapeutic activities;Therapeutic exercise;Balance training;Manual techniques;[name]/family education;Gait training;Functional  mobility training    PT Next Visit Plan n/a    PT Home Exercise Plan STS with hover above chair, continued walking program; 9/14:  3D hip excursion, POE, bridges 9/21 hip abduction, squat           [name] will benefit from skilled therapeutic intervention in order to improve the following deficits and impairments:  Decreased range of motion, Decreased strength  Visit Diagnosis: Muscle weakness (generalized)  Decreased range of motion of lumbar spine     Problem List [name] Active Problem List   Diagnosis Date Noted  . Degenerative disc disease, lumbar 01/13/2020  . Spondylolisthesis, lumbar region 01/13/2020  . Rectal bleeding 11/06/2019  . History of colonic polyps 11/06/2019  . Morbid (severe) obesity due to excess calories ([place]) 10/07/2019  . Chronic pain of right knee 10/08/2018  . Chronic right shoulder pain 05/07/2018  . History of total right knee replacement 07/30/2017  . Colon cancer screening 10/28/2013  . Umbilical hernia without mention of obstruction or gangrene 10/28/2013    12:01 PM, 03/29/20 [name] PT, DPT Physical Therapist at [facility] [place], [place], [address] Phone: [phone]   Fax:  [phone]  Name: [name] MRN: [id number] Date of Birth: 06-16-1956

## 2020-03-31 ENCOUNTER — Ambulatory Visit: Payer: Medicare Other | Admitting: Orthopaedic Surgery

## 2020-04-05 ENCOUNTER — Encounter (HOSPITAL_COMMUNITY): Payer: Medicare Other | Admitting: Physical Therapy

## 2020-04-20 ENCOUNTER — Encounter: Payer: Self-pay | Admitting: Orthopaedic Surgery

## 2020-04-20 ENCOUNTER — Ambulatory Visit (INDEPENDENT_AMBULATORY_CARE_PROVIDER_SITE_OTHER): Payer: Medicare Other | Admitting: Orthopaedic Surgery

## 2020-04-20 ENCOUNTER — Other Ambulatory Visit: Payer: Self-pay

## 2020-04-20 VITALS — Ht 65.0 in | Wt 253.0 lb

## 2020-04-20 DIAGNOSIS — M5136 Other intervertebral disc degeneration, lumbar region: Secondary | ICD-10-CM | POA: Diagnosis not present

## 2020-04-20 NOTE — Progress Notes (Signed)
Office Visit Note   Patient: [name]           Date of Birth: 1956/01/26           MRN: [id number] Visit Date: 04/20/2020              Requested by: Celene Squibb, MD [place],  [place] [id number] PCP: Celene Squibb, MD   Assessment & Plan: Visit Diagnoses:  1. Degenerative disc disease, lumbar     Plan: Mr. Hollyfield relates that he is feeling much better after 2 spinal injections and a course of physical therapy at [facility].  He is not having any appreciable leg pain and his back is significantly better.  He does have a home exercise program.  He is getting remarried in the near future and notes that he has lost some weight generally feeling "good".  I have encouraged him to work on his weight and his exercises and plan to see him back as needed  Follow-Up Instructions: Return if symptoms worsen or fail to improve.   Orders:  No orders of the defined types were placed in this encounter.  No orders of the defined types were placed in this encounter.     Procedures: No procedures performed   Clinical Data: No additional findings.   Subjective: Chief Complaint  Patient presents with  . Lower Back - Follow-up  . Back Pain    PT has helped and patient is walking  Mr. Stickler has had several lumbar injections through [facility] imaging and a course of therapy at [facility] and notes that he is feeling much better with little if any back pain.  He is not having any appreciable leg pain.  He denies any bowel or bladder changes  HPI  Review of Systems   Objective: Vital Signs: Ht 5\' 5"  (1.651 m)   Wt 253 lb (114.8 kg)   BMI 42.10 kg/m   Physical Exam Constitutional:      Appearance: He is well-developed.  Eyes:     Pupils: Pupils are equal, round, and reactive to light.  Pulmonary:     Effort: Pulmonary effort is normal.  Skin:    General: Skin is warm and dry.  Neurological:     Mental Status: He is alert and oriented to person,  place, and time.  Psychiatric:        Behavior: Behavior normal.     Ortho Exam wake alert oriented x3.  Very happy with his back and the fact that he is getting remarried.  Straight leg raise is negative bilaterally.  He does have neuropathy in both of his feet with some altered sensibility but good motor exam.  Does not limp.  No percussible tenderness of his lumbar spine.  Painless range of motion both hips Specialty Comments:  No specialty comments available.  Imaging: No results found.   PMFS History: Patient Active Problem List   Diagnosis Date Noted  . Degenerative disc disease, lumbar 01/13/2020  . Spondylolisthesis, lumbar region 01/13/2020  . Rectal bleeding 11/06/2019  . History of colonic polyps 11/06/2019  . Morbid (severe) obesity due to excess calories ([place]) 10/07/2019  . Chronic pain of right knee 10/08/2018  . Chronic right shoulder pain 05/07/2018  . History of total right knee replacement 07/30/2017  . Colon cancer screening 10/28/2013  . Umbilical hernia without mention of obstruction or gangrene 10/28/2013   Past Medical History:  Diagnosis Date  . Arthritis    "  right knee" (07/30/2017)  . CAP (community acquired pneumonia) 2018  . Diabetes mellitus without complication ([place])   . Hypertension   . Hypertension   . Wears glasses     Family History  Problem Relation Age of Onset  . Colon cancer Neg Hx   . Colon polyps Neg Hx     Past Surgical History:  Procedure Laterality Date  . COLONOSCOPY  2009   Fields: 3 small tubular adenomas removed.  . COLONOSCOPY N/A 12/02/2019   Procedure: COLONOSCOPY;  Surgeon: Daneil Dolin, MD;  [facility];  Service: Endoscopy;  Laterality: N/A;  1:00pm  . EYE SURGERY    . JOINT REPLACEMENT    . POLYPECTOMY  12/02/2019   Procedure: POLYPECTOMY;  Surgeon: Daneil Dolin, MD;  [facility];  Service: Endoscopy;;  . RETINAL DETACHMENT SURGERY Left 2000s  . TOTAL KNEE ARTHROPLASTY Right 07/30/2017   . TOTAL KNEE ARTHROPLASTY Right 07/30/2017   Procedure: RIGHT TOTAL KNEE ARTHROPLASTY;  Surgeon: [name], MD;  Location: [facility];  Service: Orthopedics;  Laterality: Right;  Marland Kitchen VASECTOMY     Social History   Occupational History  . Not on file  Tobacco Use  . Smoking status: Never Smoker  . Smokeless tobacco: Never Used  Vaping Use  . Vaping Use: Never used  Substance and Sexual Activity  . Alcohol use: No  . Drug use: No  . Sexual activity: Never     [name], MD   Note - This record has been created using [employer].  Chart creation errors have been sought, but may not always  have been located. Such creation errors do not reflect on  the standard of medical care.

## 2020-04-22 DIAGNOSIS — Z23 Encounter for immunization: Secondary | ICD-10-CM | POA: Diagnosis not present

## 2020-06-15 ENCOUNTER — Encounter: Payer: Self-pay | Admitting: Orthopaedic Surgery

## 2020-06-15 ENCOUNTER — Ambulatory Visit (INDEPENDENT_AMBULATORY_CARE_PROVIDER_SITE_OTHER): Payer: Medicare Other | Admitting: Orthopaedic Surgery

## 2020-06-15 ENCOUNTER — Other Ambulatory Visit: Payer: Self-pay

## 2020-06-15 DIAGNOSIS — M1712 Unilateral primary osteoarthritis, left knee: Secondary | ICD-10-CM

## 2020-06-15 MED ORDER — BUPIVACAINE HCL 0.5 % IJ SOLN
2.0000 mL | INTRAMUSCULAR | Status: AC | PRN
  Administered 2020-06-15: 2 mL via INTRA_ARTICULAR

## 2020-06-15 MED ORDER — LIDOCAINE HCL 1 % IJ SOLN
2.0000 mL | INTRAMUSCULAR | Status: AC | PRN
  Administered 2020-06-15: 2 mL

## 2020-06-15 NOTE — Progress Notes (Signed)
Office Visit Note   Patient: [name]           Date of Birth: September 29, 1955           MRN: [id number] Visit Date: 06/15/2020              Requested by: Celene Squibb, MD [place],  [place] [id number] PCP: Celene Squibb, MD   Assessment & Plan: Visit Diagnoses:  1. Unilateral primary osteoarthritis, left knee     Plan: Osteoarthritis left knee by prior films.  Will inject the knee with cortisone and monitor response  Follow-Up Instructions: Return if symptoms worsen or fail to improve.   Orders:  Orders Placed This Encounter  Procedures  . Large Joint Inj: L knee   No orders of the defined types were placed in this encounter.     Procedures: Large Joint Inj: L knee on 06/15/2020 3:35 PM Indications: pain and diagnostic evaluation Details: 25 G 1.5 in needle, anteromedial approach  Arthrogram: No  Medications: 2 mL lidocaine 1 %; 2 mL bupivacaine 0.5 %  2 mL betamethasone injected with Xylocaine and Marcaine Procedure, treatment alternatives, risks and benefits explained, specific risks discussed. Consent was given by the patient. Patient was prepped and draped in the usual sterile fashion.       Clinical Data: No additional findings.   Subjective: Chief Complaint  Patient presents with  . Left Knee - Pain  Patient presents today for left knee pain. He said that it started to hurt about two weeks ago after pushing shopping carts back inside at work. He was here for his left knee in April and received a cortisone injection. He does not take anything for pain.   HPI  Review of Systems   Objective: Vital Signs: Ht 5\' 5"  (1.651 m)   Wt 253 lb (114.8 kg)   BMI 42.10 kg/m   Physical Exam Constitutional:      Appearance: He is well-developed and well-nourished.  HENT:     Mouth/Throat:     Mouth: Oropharynx is clear and moist.  Eyes:     Extraocular Movements: EOM normal.     Pupils: Pupils are equal, round, and reactive to light.   Pulmonary:     Effort: Pulmonary effort is normal.  Skin:    General: Skin is warm and dry.  Neurological:     Mental Status: He is alert and oriented to person, place, and time.  Psychiatric:        Mood and Affect: Mood and affect normal.        Behavior: Behavior normal.     Ortho Exam awake alert and oriented x3.  Comfortable sitting.  Left knee without effusion but does have mild diffuse medial joint pain.  Full extension flexed over 100 degrees without instability.  No popliteal pain or mass.  No distal edema  Specialty Comments:  No specialty comments available.  Imaging: No results found.   PMFS History: Patient Active Problem List   Diagnosis Date Noted  . Unilateral primary osteoarthritis, left knee 06/15/2020  . Degenerative disc disease, lumbar 01/13/2020  . Spondylolisthesis, lumbar region 01/13/2020  . Rectal bleeding 11/06/2019  . History of colonic polyps 11/06/2019  . Morbid (severe) obesity due to excess calories ([place]) 10/07/2019  . Chronic pain of right knee 10/08/2018  . Chronic right shoulder pain 05/07/2018  . History of total right knee replacement 07/30/2017  . Colon cancer screening 10/28/2013  . Umbilical  hernia without mention of obstruction or gangrene 10/28/2013   Past Medical History:  Diagnosis Date  . Arthritis    "right knee" (07/30/2017)  . CAP (community acquired pneumonia) 2018  . Diabetes mellitus without complication ([place])   . Hypertension   . Hypertension   . Wears glasses     Family History  Problem Relation Age of Onset  . Colon cancer Neg Hx   . Colon polyps Neg Hx     Past Surgical History:  Procedure Laterality Date  . COLONOSCOPY  2009   Fields: 3 small tubular adenomas removed.  . COLONOSCOPY N/A 12/02/2019   Procedure: COLONOSCOPY;  Surgeon: Daneil Dolin, MD;  [facility];  Service: Endoscopy;  Laterality: N/A;  1:00pm  . EYE SURGERY    . JOINT REPLACEMENT    . POLYPECTOMY  12/02/2019   Procedure:  POLYPECTOMY;  Surgeon: Daneil Dolin, MD;  [facility];  Service: Endoscopy;;  . RETINAL DETACHMENT SURGERY Left 2000s  . TOTAL KNEE ARTHROPLASTY Right 07/30/2017  . TOTAL KNEE ARTHROPLASTY Right 07/30/2017   Procedure: RIGHT TOTAL KNEE ARTHROPLASTY;  Surgeon: [name], MD;  Location: [facility];  Service: Orthopedics;  Laterality: Right;  Marland Kitchen VASECTOMY     Social History   Occupational History  . Not on file  Tobacco Use  . Smoking status: Never Smoker  . Smokeless tobacco: Never Used  Vaping Use  . Vaping Use: Never used  Substance and Sexual Activity  . Alcohol use: No  . Drug use: No  . Sexual activity: Never

## 2020-07-11 DIAGNOSIS — R519 Headache, unspecified: Secondary | ICD-10-CM | POA: Diagnosis not present

## 2020-07-11 DIAGNOSIS — J0100 Acute maxillary sinusitis, unspecified: Secondary | ICD-10-CM | POA: Diagnosis not present

## 2020-07-20 ENCOUNTER — Telehealth: Payer: Self-pay

## 2020-07-20 NOTE — Telephone Encounter (Signed)
Patient called [facility] office and spoke with [place]. He was requesting a refill of his prednisone 20mg  tablets. Dr.Whitfield did not fill this script and advised me to call patient. I called patient and suggested he call the physician who originally filled this script and ask for a refill. Patient understands that Dr.Whitfield will not prescribe this.

## 2020-07-21 ENCOUNTER — Ambulatory Visit
Admission: EM | Admit: 2020-07-21 | Discharge: 2020-07-21 | Disposition: A | Discharge status: Home / Self Care | Payer: Medicare Other | Attending: Emergency Medicine | Admitting: Emergency Medicine

## 2020-07-21 ENCOUNTER — Encounter: Payer: Self-pay | Admitting: Emergency Medicine

## 2020-07-21 DIAGNOSIS — M5441 Lumbago with sciatica, right side: Secondary | ICD-10-CM | POA: Diagnosis not present

## 2020-07-21 DIAGNOSIS — G8929 Other chronic pain: Secondary | ICD-10-CM

## 2020-07-21 DIAGNOSIS — M5442 Lumbago with sciatica, left side: Secondary | ICD-10-CM

## 2020-07-21 MED ORDER — DEXAMETHASONE SODIUM PHOSPHATE 10 MG/ML IJ SOLN
10.0000 mg | Freq: Once | INTRAMUSCULAR | Status: AC
  Administered 2020-07-21: 10 mg via INTRAMUSCULAR

## 2020-07-21 MED ORDER — PREDNISONE 10 MG PO TABS
20.0000 mg | ORAL_TABLET | Freq: Every day | ORAL | 0 refills | Status: DC
Start: 2020-07-21 — End: 2020-08-25

## 2020-07-21 NOTE — Discharge Instructions (Addendum)
Rest, ice and heat as needed Ensure adequate ROM as tolerated. Decadron IM was given in office   Continue Tylenol 500 mg as needed for pain Prescribed prednisone take as directed  Return here or go to ER if you have any new or worsening symptoms such as numbness/tingling of the inner thighs, loss of bladder or bowel control, headache/blurry vision, nausea/vomiting, confusion/altered mental status, dizziness, weakness, passing out, imbalance, etc..Marland Kitchen

## 2020-07-21 NOTE — ED Triage Notes (Signed)
Chronic low back pain that goes down both legs

## 2020-07-21 NOTE — ED Provider Notes (Signed)
[name]   [id number] 07/21/20 Arrival Time: 1119   Chief Complaint  Patient presents with  . Back Pain    SUBJECTIVE: History from: patient.  [name] is a 65 y.o. male who presented to the urgent care with a complaint of acute on chronic low back pain for the past few days.  Denies any precipitating event, trauma or injury.  He localizes the pain to the low back that radiates to bilateral legs.  He describes the pain as constant and achy.  He has tried OTC medications without relief.  His symptoms are made worse with ROM.  He denies similar symptoms in the past.  Denies chills, fever, nausea, vomiting, diarrhea, paresthesia, diplopia, loss of bowel and bladder function.  ROS: As per HPI.  All other pertinent ROS negative.      Past Medical History:  Diagnosis Date  . Arthritis    "right knee" (07/30/2017)  . CAP (community acquired pneumonia) 2018  . Diabetes mellitus without complication ([place])   . Hypertension   . Hypertension   . Wears glasses    Past Surgical History:  Procedure Laterality Date  . COLONOSCOPY  2009   Fields: 3 small tubular adenomas removed.  . COLONOSCOPY N/A 12/02/2019   Procedure: COLONOSCOPY;  Surgeon: Daneil Dolin, MD;  [facility];  Service: Endoscopy;  Laterality: N/A;  1:00pm  . EYE SURGERY    . JOINT REPLACEMENT    . POLYPECTOMY  12/02/2019   Procedure: POLYPECTOMY;  Surgeon: Daneil Dolin, MD;  [facility];  Service: Endoscopy;;  . RETINAL DETACHMENT SURGERY Left 2000s  . TOTAL KNEE ARTHROPLASTY Right 07/30/2017  . TOTAL KNEE ARTHROPLASTY Right 07/30/2017   Procedure: RIGHT TOTAL KNEE ARTHROPLASTY;  Surgeon: [name], MD;  Location: [facility];  Service: Orthopedics;  Laterality: Right;  Marland Kitchen VASECTOMY     Allergies  Allergen Reactions  . Flexeril [Cyclobenzaprine] Shortness Of Breath    Can not take any muscle relaxer causes respirator depression   . Robaxin [Methocarbamol] Shortness Of  Breath    Can not take any muscle relaxer causes respirator depression   No current facility-administered medications on file prior to encounter.   Current Outpatient Medications on File Prior to Encounter  Medication Sig Dispense Refill  . acetaminophen (TYLENOL) 325 MG tablet Take 2 tablets (650 mg total) by mouth every 4 (four) hours as needed. (Patient taking differently: Take 650 mg by mouth as needed.)    . losartan-hydrochlorothiazide (HYZAAR) 50-12.5 MG tablet Take 1 tablet by mouth daily.    . meloxicam (MOBIC) 15 MG tablet Take 15 mg by mouth daily.     . metFORMIN (GLUCOPHAGE) 500 MG tablet Take 500 mg by mouth daily.      Social History   Socioeconomic History  . Marital status: Widowed    Spouse name: Not on file  . Number of children: Not on file  . Years of education: Not on file  . Highest education level: Not on file  Occupational History  . Not on file  Tobacco Use  . Smoking status: Never Smoker  . Smokeless tobacco: Never Used  Vaping Use  . Vaping Use: Never used  Substance and Sexual Activity  . Alcohol use: No  . Drug use: No  . Sexual activity: Never  Other Topics Concern  . Not on file  Social History Narrative  . Not on file   Social Determinants of Health   Financial Resource Strain:  Not on file  Food Insecurity: Not on file  Transportation Needs: Not on file  Physical Activity: Not on file  Stress: Not on file  Social Connections: Not on file  Intimate Partner Violence: Not on file   Family History  Problem Relation Age of Onset  . Colon cancer Neg Hx   . Colon polyps Neg Hx     OBJECTIVE:  Vitals:   07/21/20 1139  BP: 122/78  Pulse: 70  Resp: 19  Temp: 98.5 F (36.9 C)  TempSrc: Oral  SpO2: 94%     Physical Exam Vitals and nursing note reviewed. Exam conducted with a chaperone present.  Constitutional:      General: He is not in acute distress.    Appearance: Normal appearance. He is normal weight. He is not  ill-appearing, toxic-appearing or diaphoretic.  Cardiovascular:     Rate and Rhythm: Normal rate and regular rhythm.     Pulses: Normal pulses.     Heart sounds: Normal heart sounds. No murmur heard. No friction rub. No gallop.   Pulmonary:     Effort: Pulmonary effort is normal. No respiratory distress.     Breath sounds: Normal breath sounds. No stridor. No wheezing, rhonchi or rales.  Chest:     Chest wall: No tenderness.  Musculoskeletal:     Lumbar back: Spasms and tenderness present.     Comments: Back:  Patient ambulates from chair to exam table without difficulty.  Inspection: Skin clear and intact without obvious swelling, erythema, or ecchymosis. Warm to the touch  Palpation: Vertebral processes nontender. Tenderness about the lower  paravertebral muscles  ROM: FROM Strength: 5/5 hip flexion, 5/5 knee extension, 5/5 knee flexion, 5/5 plantar flexion, 5/5 dorsiflexion    Neurological:     Mental Status: He is alert and oriented to person, place, and time.    LABS:  No results found for this or any previous visit (from the past 24 hour(s)).   ASSESSMENT & PLAN:  1. Chronic bilateral low back pain with bilateral sciatica     Meds ordered this encounter  Medications  . dexamethasone (DECADRON) injection 10 mg  . predniSONE (DELTASONE) 10 MG tablet    Sig: Take 2 tablets (20 mg total) by mouth daily.    Dispense:  15 tablet    Refill:  0   Discharge instructions  Rest, ice and heat as needed Ensure adequate ROM as tolerated. Decadron IM was given in office Continue Tylenol 500 mg as needed for pain Prescribed prednisone take as directed return here or go to ER if you have any new or worsening symptoms such as numbness/tingling of the inner thighs, loss of bladder or bowel control, headache/blurry vision, nausea/vomiting, confusion/altered mental status, dizziness, weakness, passing out, imbalance, etc...    Reviewed expectations re: course of current medical  issues. Questions answered. Outlined signs and symptoms indicating need for more acute intervention. Patient verbalized understanding. After Visit Summary given.         Emerson Monte, FNP 07/21/20 1240

## 2020-07-22 ENCOUNTER — Telehealth: Payer: Self-pay | Admitting: Radiology

## 2020-07-22 NOTE — Telephone Encounter (Signed)
FYI  Please see message from [facility] office below.  Pt is being seen at Urgent Care in [place] and wants Dr Durward Fortes to be aware

## 2020-07-22 NOTE — Telephone Encounter (Signed)
thanks

## 2020-07-26 ENCOUNTER — Encounter: Payer: Self-pay | Admitting: Emergency Medicine

## 2020-07-26 ENCOUNTER — Other Ambulatory Visit: Payer: Self-pay

## 2020-07-26 ENCOUNTER — Ambulatory Visit
Admission: EM | Admit: 2020-07-26 | Discharge: 2020-07-26 | Disposition: A | Discharge status: Home / Self Care | Payer: Medicare Other | Attending: Emergency Medicine | Admitting: Emergency Medicine

## 2020-07-26 DIAGNOSIS — M62838 Other muscle spasm: Secondary | ICD-10-CM

## 2020-07-26 MED ORDER — DEXAMETHASONE SODIUM PHOSPHATE 10 MG/ML IJ SOLN
10.0000 mg | Freq: Once | INTRAMUSCULAR | Status: AC
  Administered 2020-07-26: 10 mg via INTRAMUSCULAR

## 2020-07-26 NOTE — ED Triage Notes (Signed)
Complains of muscle cramping in lower back and down bilateral legs x couple days

## 2020-07-26 NOTE — Discharge Instructions (Addendum)
Increase fluid intake May take multivitamin  May use ice and heat therapy  Follow the instruction of managing pain and stiffness that is attached Follow-up with PCP  Return or go to ED if you develop any new or worsening of his symptoms

## 2020-07-26 NOTE — ED Provider Notes (Signed)
[place]   [id number] 07/26/20 Arrival Time: [phone]   Chief Complaint  Patient presents with  . Spasms     SUBJECTIVE: History from: patient.  [name] is a 65 y.o. male who presented to the urgent care for complaint of muscle spasm to lower back that radiated down the leg for the past couple days.  Was previously seen at the urgent care for low back pain with sciatica involvement.  Has tried prednisone with mild relief.  Patient is allergic to muscle relaxer as it can cause respiratory depression.  Denies any alleviating or aggravating factors.  Reports similar symptoms in the past as a result.  Symptoms are made worse with range of motion.  He denies similar symptoms in the past.  Denies chills, fever, nausea, vomiting, diarrhea, paresthesia, diplopia, blurry vision, loss of bowel and bladder function.  ROS: As per HPI.  All other pertinent ROS negative.      Past Medical History:  Diagnosis Date  . Arthritis    "right knee" (07/30/2017)  . CAP (community acquired pneumonia) 2018  . Diabetes mellitus without complication ([place])   . Hypertension   . Hypertension   . Wears glasses    Past Surgical History:  Procedure Laterality Date  . COLONOSCOPY  2009   Fields: 3 small tubular adenomas removed.  . COLONOSCOPY N/A 12/02/2019   Procedure: COLONOSCOPY;  Surgeon: Daneil Dolin, MD;  [facility];  Service: Endoscopy;  Laterality: N/A;  1:00pm  . EYE SURGERY    . JOINT REPLACEMENT    . POLYPECTOMY  12/02/2019   Procedure: POLYPECTOMY;  Surgeon: Daneil Dolin, MD;  [facility];  Service: Endoscopy;;  . RETINAL DETACHMENT SURGERY Left 2000s  . TOTAL KNEE ARTHROPLASTY Right 07/30/2017  . TOTAL KNEE ARTHROPLASTY Right 07/30/2017   Procedure: RIGHT TOTAL KNEE ARTHROPLASTY;  Surgeon: [name], MD;  Location: [facility];  Service: Orthopedics;  Laterality: Right;  Marland Kitchen VASECTOMY     Allergies  Allergen Reactions  . Flexeril  [Cyclobenzaprine] Shortness Of Breath    Can not take any muscle relaxer causes respirator depression   . Robaxin [Methocarbamol] Shortness Of Breath    Can not take any muscle relaxer causes respirator depression   No current facility-administered medications on file prior to encounter.   Current Outpatient Medications on File Prior to Encounter  Medication Sig Dispense Refill  . acetaminophen (TYLENOL) 325 MG tablet Take 2 tablets (650 mg total) by mouth every 4 (four) hours as needed. (Patient taking differently: Take 650 mg by mouth as needed.)    . losartan-hydrochlorothiazide (HYZAAR) 50-12.5 MG tablet Take 1 tablet by mouth daily.    . meloxicam (MOBIC) 15 MG tablet Take 15 mg by mouth daily.     . metFORMIN (GLUCOPHAGE) 500 MG tablet Take 500 mg by mouth daily.     . predniSONE (DELTASONE) 10 MG tablet Take 2 tablets (20 mg total) by mouth daily. 15 tablet 0   Social History   Socioeconomic History  . Marital status: Widowed    Spouse name: Not on file  . Number of children: Not on file  . Years of education: Not on file  . Highest education level: Not on file  Occupational History  . Not on file  Tobacco Use  . Smoking status: Never Smoker  . Smokeless tobacco: Never Used  Vaping Use  . Vaping Use: Never used  Substance and Sexual Activity  . Alcohol use: No  .  Drug use: No  . Sexual activity: Never  Other Topics Concern  . Not on file  Social History Narrative  . Not on file   Social Determinants of Health   Financial Resource Strain: Not on file  Food Insecurity: Not on file  Transportation Needs: Not on file  Physical Activity: Not on file  Stress: Not on file  Social Connections: Not on file  Intimate Partner Violence: Not on file   Family History  Problem Relation Age of Onset  . Colon cancer Neg Hx   . Colon polyps Neg Hx     OBJECTIVE:  Vitals:   07/26/20 1008  BP: 132/83  Pulse: 66  Resp: 15  Temp: 97.9 F (36.6 C)  SpO2: 98%      Physical Exam Vitals and nursing note reviewed.  Constitutional:      General: He is not in acute distress.    Appearance: Normal appearance. He is normal weight. He is not ill-appearing, toxic-appearing or diaphoretic.  Cardiovascular:     Rate and Rhythm: Normal rate and regular rhythm.     Pulses: Normal pulses.     Heart sounds: Normal heart sounds. No murmur heard. No friction rub. No gallop.   Pulmonary:     Effort: Pulmonary effort is normal. No respiratory distress.     Breath sounds: Normal breath sounds. No stridor. No wheezing, rhonchi or rales.  Chest:     Chest wall: No tenderness.  Musculoskeletal:     Lumbar back: Spasms present.  Neurological:     Mental Status: He is alert and oriented to person, place, and time.     LABS:  No results found for this or any previous visit (from the past 24 hour(s)).   ASSESSMENT & PLAN:  1. Muscle spasm     Meds ordered this encounter  Medications  . dexamethasone (DECADRON) injection 10 mg    Discharge Instructions  Increase fluid intake May take multivitamin  May use ice and heat therapy  Follow the instruction of managing pain and stiffness that is attached Follow-up with PCP  Return or go to ED if you develop any new or worsening of his symptoms   Reviewed expectations re: course of current medical issues. Questions answered. Outlined signs and symptoms indicating need for more acute intervention. Patient verbalized understanding. After Visit Summary given.         Emerson Monte, [place] 07/26/20 1157

## 2020-07-27 ENCOUNTER — Ambulatory Visit (INDEPENDENT_AMBULATORY_CARE_PROVIDER_SITE_OTHER): Payer: Medicare Other | Admitting: Orthopaedic Surgery

## 2020-07-27 ENCOUNTER — Encounter: Payer: Self-pay | Admitting: Orthopaedic Surgery

## 2020-07-27 VITALS — Ht 66.0 in | Wt 255.0 lb

## 2020-07-27 DIAGNOSIS — M5136 Other intervertebral disc degeneration, lumbar region: Secondary | ICD-10-CM

## 2020-07-27 DIAGNOSIS — M4316 Spondylolisthesis, lumbar region: Secondary | ICD-10-CM | POA: Diagnosis not present

## 2020-07-27 NOTE — Addendum Note (Signed)
Addended by: Meyer Cory on: 07/27/2020 03:43 PM   Modules accepted: Orders

## 2020-07-27 NOTE — Progress Notes (Signed)
Office Visit Note   Patient: [name]           Date of Birth: 01-24-1956           MRN: [id number] Visit Date: 07/27/2020              Requested by: Celene Squibb, MD [address] [place],   [id number] PCP: Celene Squibb, MD   Assessment & Plan: Visit Diagnoses:  1. Spondylolisthesis, lumbar region   2. Degenerative disc disease, lumbar     Plan: [name] has been followed recently for the problem referable to his lumbar spine.  He had an MRI scan in August 2021 demonstrating broad-based foraminal and extraforaminal disc protrusions on the left at L2-3 with possible compression of the left L2 nerve root.  There was a moderate size left paracentral disc protrusion at L3-4 with moderate spinal stenosis and asymmetric narrowing of the left lateral recess and left foramen.  There was advanced facet hypertrophy at L4-5 with a grade 1 anterior listhesis.  He has had 1 epidural steroid injection that made a huge difference.  In the interim he has developed further buttock and bilateral leg pain.  He was seen at the urgent care facility over the weekend and he was asked to follow-up in the office.  He has had similar pains in the past prior to the epidural steroid injection so I would suggest we have another injection.  We will set this up and have him return to see me in a month  Follow-Up Instructions: Return in about 1 month (around 08/27/2020).   Orders:  No orders of the defined types were placed in this encounter.  No orders of the defined types were placed in this encounter.     Procedures: No procedures performed   Clinical Data: No additional findings.   Subjective: Chief Complaint  Patient presents with  . Right Leg - Pain  . Left Leg - Pain  Patient presents with muscle spasms and pain in bilateral lower extremities. He has been to the Urgent Care x 2 due to the pain. He states that he was doing fine until he had to stay in due to the weather. He was  given prednisone and meloxicam for his symptoms. He takes tylenol to sleep. He states that the pain is worse if he stands. He denies any bowel or bladder changes.  He is not having any numbness or tingling.  He just has pain when he stands or walks for any length of time.               HPI  Review of Systems   Objective: Vital Signs: Ht 5\' 6"  (1.676 m)   Wt 255 lb (115.7 kg)   BMI 41.16 kg/m   Physical Exam Constitutional:      Appearance: He is well-developed and well-nourished.  HENT:     Mouth/Throat:     Mouth: Oropharynx is clear and moist.  Eyes:     Extraocular Movements: EOM normal.     Pupils: Pupils are equal, round, and reactive to light.  Pulmonary:     Effort: Pulmonary effort is normal.  Skin:    General: Skin is warm and dry.  Neurological:     Mental Status: He is alert and oriented to person, place, and time.  Psychiatric:        Mood and Affect: Mood and affect normal.        Behavior: Behavior  normal.     Ortho Exam comfortable sitting.  Straight leg raise negative.  Painless range of motion both hips.  Motor exam intact.  No pain about either SI joint.  Specialty Comments:  No specialty comments available.  Imaging: No results found.   PMFS History: Patient Active Problem List   Diagnosis Date Noted  . Unilateral primary osteoarthritis, left knee 06/15/2020  . Degenerative disc disease, lumbar 01/13/2020  . Spondylolisthesis, lumbar region 01/13/2020  . Rectal bleeding 11/06/2019  . History of colonic polyps 11/06/2019  . Morbid (severe) obesity due to excess calories ([place]) 10/07/2019  . Chronic pain of right knee 10/08/2018  . Chronic right shoulder pain 05/07/2018  . History of total right knee replacement 07/30/2017  . Colon cancer screening 10/28/2013  . Umbilical hernia without mention of obstruction or gangrene 10/28/2013   Past Medical History:  Diagnosis Date  . Arthritis    "right knee" (07/30/2017)  . CAP  (community acquired pneumonia) 2018  . Diabetes mellitus without complication ([place])   . Hypertension   . Hypertension   . Wears glasses     Family History  Problem Relation Age of Onset  . Colon cancer Neg Hx   . Colon polyps Neg Hx     Past Surgical History:  Procedure Laterality Date  . COLONOSCOPY  2009   Fields: 3 small tubular adenomas removed.  . COLONOSCOPY N/A 12/02/2019   Procedure: COLONOSCOPY;  Surgeon: Daneil Dolin, MD;  [facility];  Service: Endoscopy;  Laterality: N/A;  1:00pm  . EYE SURGERY    . JOINT REPLACEMENT    . POLYPECTOMY  12/02/2019   Procedure: POLYPECTOMY;  Surgeon: Daneil Dolin, MD;  [facility];  Service: Endoscopy;;  . RETINAL DETACHMENT SURGERY Left 2000s  . TOTAL KNEE ARTHROPLASTY Right 07/30/2017  . TOTAL KNEE ARTHROPLASTY Right 07/30/2017   Procedure: RIGHT TOTAL KNEE ARTHROPLASTY;  Surgeon: [name], MD;  Location: [facility];  Service: Orthopedics;  Laterality: Right;  Marland Kitchen VASECTOMY     Social History   Occupational History  . Not on file  Tobacco Use  . Smoking status: Never Smoker  . Smokeless tobacco: Never Used  Vaping Use  . Vaping Use: Never used  Substance and Sexual Activity  . Alcohol use: No  . Drug use: No  . Sexual activity: Never

## 2020-07-28 DIAGNOSIS — M79641 Pain in right hand: Secondary | ICD-10-CM | POA: Diagnosis not present

## 2020-07-28 DIAGNOSIS — H33019 Retinal detachment with single break, unspecified eye: Secondary | ICD-10-CM | POA: Diagnosis not present

## 2020-07-28 DIAGNOSIS — M25511 Pain in right shoulder: Secondary | ICD-10-CM | POA: Diagnosis not present

## 2020-07-28 DIAGNOSIS — Z86010 Personal history of colonic polyps: Secondary | ICD-10-CM | POA: Diagnosis not present

## 2020-07-28 DIAGNOSIS — M545 Low back pain, unspecified: Secondary | ICD-10-CM | POA: Diagnosis not present

## 2020-07-28 DIAGNOSIS — E1165 Type 2 diabetes mellitus with hyperglycemia: Secondary | ICD-10-CM | POA: Diagnosis not present

## 2020-07-28 DIAGNOSIS — Z96651 Presence of right artificial knee joint: Secondary | ICD-10-CM | POA: Diagnosis not present

## 2020-07-28 DIAGNOSIS — Z712 Person consulting for explanation of examination or test findings: Secondary | ICD-10-CM | POA: Diagnosis not present

## 2020-07-28 DIAGNOSIS — Z0001 Encounter for general adult medical examination with abnormal findings: Secondary | ICD-10-CM | POA: Diagnosis not present

## 2020-07-28 DIAGNOSIS — M25561 Pain in right knee: Secondary | ICD-10-CM | POA: Diagnosis not present

## 2020-07-28 DIAGNOSIS — M5136 Other intervertebral disc degeneration, lumbar region: Secondary | ICD-10-CM | POA: Diagnosis not present

## 2020-07-28 DIAGNOSIS — K429 Umbilical hernia without obstruction or gangrene: Secondary | ICD-10-CM | POA: Diagnosis not present

## 2020-07-29 ENCOUNTER — Ambulatory Visit (INDEPENDENT_AMBULATORY_CARE_PROVIDER_SITE_OTHER): Payer: Medicare Other | Admitting: Gastroenterology

## 2020-07-29 ENCOUNTER — Encounter: Payer: Self-pay | Admitting: *Deleted

## 2020-07-29 ENCOUNTER — Other Ambulatory Visit: Payer: Self-pay

## 2020-07-29 ENCOUNTER — Telehealth: Payer: Self-pay | Admitting: *Deleted

## 2020-07-29 ENCOUNTER — Encounter: Payer: Self-pay | Admitting: Gastroenterology

## 2020-07-29 VITALS — BP 138/79 | HR 55 | Temp 96.9°F | Ht 66.0 in | Wt 255.6 lb

## 2020-07-29 DIAGNOSIS — K625 Hemorrhage of anus and rectum: Secondary | ICD-10-CM

## 2020-07-29 LAB — CBC WITH DIFFERENTIAL/PLATELET
Absolute Monocytes: 936 cells/uL (ref 200–950)
Basophils Absolute: 23 cells/uL (ref 0–200)
Basophils Relative: 0.2 %
Eosinophils Absolute: 23 cells/uL (ref 15–500)
Eosinophils Relative: 0.2 %
HCT: 40.2 % (ref 38.5–50.0)
Hemoglobin: 13.5 g/dL (ref 13.2–17.1)
Lymphs Abs: 2902 cells/uL (ref 850–3900)
MCH: 30.8 pg (ref 27.0–33.0)
MCHC: 33.6 g/dL (ref 32.0–36.0)
MCV: 91.6 fL (ref 80.0–100.0)
MPV: 10.8 fL (ref 7.5–12.5)
Monocytes Relative: 8 %
Neutro Abs: 7816 cells/uL — ABNORMAL HIGH (ref 1500–7800)
Neutrophils Relative %: 66.8 %
Platelets: 238 10*3/uL (ref 140–400)
RBC: 4.39 10*6/uL (ref 4.20–5.80)
RDW: 12.1 % (ref 11.0–15.0)
Total Lymphocyte: 24.8 %
WBC: 11.7 10*3/uL — ABNORMAL HIGH (ref 3.8–10.8)

## 2020-07-29 MED ORDER — HYDROCORTISONE (PERIANAL) 2.5 % EX CREA
1.0000 | TOPICAL_CREAM | Freq: Two times a day (BID) | CUTANEOUS | 1 refills | Status: DC
Start: 2020-07-29 — End: 2020-08-25

## 2020-07-29 NOTE — Progress Notes (Addendum)
  Referring Provider: Hall, John Z, MD Primary Care Physician:  Hall, John Z, MD Primary GI: Dr. Carver  Chief Complaint  Patient presents with  . Rectal Bleeding    HPI:   [name] is a 64 y.o. male presenting today with a history of adenomas, rectal bleeding, recent colonoscopy with three 4-5 mm polyps (hyperplastic and benign small bowel mucosa) in sigmoid colon at IC valve, redundant colon, non-bleeding internal hemorrhoids.   Has had intermittent rectal bleeding recently. No rectal discomfort.  No itching. No pressure. BM two to 3 times per day. No straining. No prolonged sitting on toilet. No abdominal pain. Chronic back pain. Short term dose of prednisone for back.   He is very concerned and wants another colonoscopy. Declining hemorrhoid banding today. Determined to have an endoscopic evaluation as she is very concerned. States his wife died a year ago, and she used to keep him on track.   Past Medical History:  Diagnosis Date  . Arthritis    "right knee" (07/30/2017)  . CAP (community acquired pneumonia) 2018  . Diabetes mellitus without complication ([facility])   . Hypertension   . Hypertension   . Wears glasses     Past Surgical History:  Procedure Laterality Date  . COLONOSCOPY  2009   Fields: 3 small tubular adenomas removed.  . COLONOSCOPY N/A 12/02/2019    three 4-5 mm polyps (hyperplastic and benign small bowel mucosa) in sigmoid colon at IC valve, redundant colon, non-bleeding internal hemorrhoids.   . EYE SURGERY    . JOINT REPLACEMENT    . POLYPECTOMY  12/02/2019   Procedure: POLYPECTOMY;  Surgeon: Rourk, Robert M, MD;  [facility];  Service: Endoscopy;;  . RETINAL DETACHMENT SURGERY Left 2000s  . TOTAL KNEE ARTHROPLASTY Right 07/30/2017  . TOTAL KNEE ARTHROPLASTY Right 07/30/2017   Procedure: RIGHT TOTAL KNEE ARTHROPLASTY;  Surgeon: Whitfield, Peter W, MD;  [facility];  Service: Orthopedics;  Laterality: Right;  . VASECTOMY      Current  Outpatient Medications  Medication Sig Dispense Refill  . acetaminophen (TYLENOL) 325 MG tablet Take 2 tablets (650 mg total) by mouth every 4 (four) hours as needed. (Patient taking differently: Take 650 mg by mouth as needed.)    . losartan-hydrochlorothiazide (HYZAAR) 50-12.5 MG tablet Take 1 tablet by mouth daily.    . meloxicam (MOBIC) 15 MG tablet Take 15 mg by mouth daily.     . metFORMIN (GLUCOPHAGE) 500 MG tablet Take 500 mg by mouth daily.     . predniSONE (DELTASONE) 10 MG tablet Take 2 tablets (20 mg total) by mouth daily. 15 tablet 0   No current facility-administered medications for this visit.    Allergies as of 07/29/2020 - Review Complete 07/29/2020  Allergen Reaction Noted  . Flexeril [cyclobenzaprine] Shortness Of Breath 07/21/2013  . Robaxin [methocarbamol] Shortness Of Breath 07/21/2013    Family History  Problem Relation Age of Onset  . Colon cancer Neg Hx   . Colon polyps Neg Hx     Social History   Socioeconomic History  . Marital status: Widowed    Spouse name: Not on file  . Number of children: Not on file  . Years of education: Not on file  . Highest education level: Not on file  Occupational History  . Not on file  Tobacco Use  . Smoking status: Never Smoker  . Smokeless tobacco: Never Used  Vaping Use  . Vaping Use: Never used  Substance and   Sexual Activity  . Alcohol use: No  . Drug use: No  . Sexual activity: Never  Other Topics Concern  . Not on file  Social History Narrative  . Not on file   Social Determinants of Health   Financial Resource Strain: Not on file  Food Insecurity: Not on file  Transportation Needs: Not on file  Physical Activity: Not on file  Stress: Not on file  Social Connections: Not on file    Review of Systems: Gen: Denies fever, chills, anorexia. Denies fatigue, weakness, weight loss.  CV: Denies chest pain, palpitations, syncope, peripheral edema, and claudication. Resp: Denies dyspnea at rest, cough,  wheezing, coughing up blood, and pleurisy. GI: see hPI Derm: Denies rash, itching, dry skin Psych: Denies depression, anxiety, memory loss, confusion. No homicidal or suicidal ideation.  Heme: see HPI  Physical Exam: BP 138/79   Pulse (!) 55   Temp (!) 96.9 F (36.1 C) (Temporal)   Ht 5\' 6"  (1.676 m)   Wt 255 lb 9.6 oz (115.9 kg)   BMI 41.25 kg/m  General:   Alert and oriented. No distress noted. Pleasant and cooperative.  Head:  Normocephalic and atraumatic. Eyes:  Conjuctiva clear without scleral icterus. Mouth:  Mask in place Lungs: clear bilaterally Cardiac: S1 S1 present no murmurs Abdomen:  +BS, soft, non-tender and non-distended. No rebound or guarding. No HSM. Reducible umbilical hernia Rectal: no mass. No external hemorrhoids. Small prolapsing tissue reduced.  Msk:  Symmetrical without gross deformities. Normal posture. Extremities:  Without edema. Neurologic:  Alert and  oriented x4 Psych:  Alert and cooperative. Normal mood and affect.  ASSESSMENT: [name] is a 65 y.o. male presenting today with painless low-volume hematochezia, most likely related to internal hemorrhoids. He has had a colonoscopy as of June 2021, with polyps, no diverticula, and documented hemorrhoids. Denies concerning features such as rectal pain, abdominal pain, fatigue, N/V, etc.  He is very concerned today and wants another evaluation endoscopically. He is quite determined to have this done despite reassurance and findings of internal hemorrhoids today on exam.   At this point, we can pursue a flexible sigmoidoscopy. No known diverticular disease, so I highly doubt he has developed diverticula in interim and dealing with diverticular bleed. Also not clinically consistent with ischemic colitis. I discussed banding at this time, but he wants to wait until after the procedure.    PLAN:  Check CBC today Flex sig with Dr. Abbey Chatters. Risks and benefits discussed with stated understanding Anusol  cream BID per rectum Follow-up thereafter for likely hemorrhoid banding   Annitta Needs, PhD, ANP-BC [facility]

## 2020-07-29 NOTE — Patient Instructions (Signed)
We are arranging a flexible sigmoidoscopy in the near future with Dr. Abbey Chatters.  I have sent in a rectal cream you can use twice a day for 7 days to calm things down.  Please have blood work completed.  I will see you in follow-up thereafter!  It was a pleasure to see you today. I want to create trusting relationships with patients to provide genuine, compassionate, and quality care. I value your feedback. If you receive a survey regarding your visit,  I greatly appreciate you taking time to fill this out.   Annitta Needs, PhD, ANP-BC [facility]

## 2020-07-29 NOTE — H&P (View-Only) (Signed)
Referring Provider: Celene Squibb, MD Primary Care Physician:  Celene Squibb, MD Primary GI: Dr. Abbey Chatters  Chief Complaint  Patient presents with  . Rectal Bleeding    HPI:   [name] is a 65 y.o. male presenting today with a history of adenomas, rectal bleeding, recent colonoscopy with three 4-5 mm polyps (hyperplastic and benign small bowel mucosa) in sigmoid colon at IC valve, redundant colon, non-bleeding internal hemorrhoids.   Has had intermittent rectal bleeding recently. No rectal discomfort.  No itching. No pressure. BM two to 3 times per day. No straining. No prolonged sitting on toilet. No abdominal pain. Chronic back pain. Short term dose of prednisone for back.   He is very concerned and wants another colonoscopy. Declining hemorrhoid banding today. Determined to have an endoscopic evaluation as she is very concerned. States his wife died a year ago, and she used to keep him on track.   Past Medical History:  Diagnosis Date  . Arthritis    "right knee" (07/30/2017)  . CAP (community acquired pneumonia) 2018  . Diabetes mellitus without complication ([place])   . Hypertension   . Hypertension   . Wears glasses     Past Surgical History:  Procedure Laterality Date  . COLONOSCOPY  2009   Fields: 3 small tubular adenomas removed.  . COLONOSCOPY N/A 12/02/2019    three 4-5 mm polyps (hyperplastic and benign small bowel mucosa) in sigmoid colon at IC valve, redundant colon, non-bleeding internal hemorrhoids.   Marland Kitchen EYE SURGERY    . JOINT REPLACEMENT    . POLYPECTOMY  12/02/2019   Procedure: POLYPECTOMY;  Surgeon: Daneil Dolin, MD;  [facility];  Service: Endoscopy;;  . RETINAL DETACHMENT SURGERY Left 2000s  . TOTAL KNEE ARTHROPLASTY Right 07/30/2017  . TOTAL KNEE ARTHROPLASTY Right 07/30/2017   Procedure: RIGHT TOTAL KNEE ARTHROPLASTY;  Surgeon: [name], MD;  Location: [facility];  Service: Orthopedics;  Laterality: Right;  Marland Kitchen VASECTOMY      Current  Outpatient Medications  Medication Sig Dispense Refill  . acetaminophen (TYLENOL) 325 MG tablet Take 2 tablets (650 mg total) by mouth every 4 (four) hours as needed. (Patient taking differently: Take 650 mg by mouth as needed.)    . losartan-hydrochlorothiazide (HYZAAR) 50-12.5 MG tablet Take 1 tablet by mouth daily.    . meloxicam (MOBIC) 15 MG tablet Take 15 mg by mouth daily.     . metFORMIN (GLUCOPHAGE) 500 MG tablet Take 500 mg by mouth daily.     . predniSONE (DELTASONE) 10 MG tablet Take 2 tablets (20 mg total) by mouth daily. 15 tablet 0   No current facility-administered medications for this visit.    Allergies as of 07/29/2020 - Review Complete 07/29/2020  Allergen Reaction Noted  . Flexeril [cyclobenzaprine] Shortness Of Breath 07/21/2013  . Robaxin [methocarbamol] Shortness Of Breath 07/21/2013    Family History  Problem Relation Age of Onset  . Colon cancer Neg Hx   . Colon polyps Neg Hx     Social History   Socioeconomic History  . Marital status: Widowed    Spouse name: Not on file  . Number of children: Not on file  . Years of education: Not on file  . Highest education level: Not on file  Occupational History  . Not on file  Tobacco Use  . Smoking status: Never Smoker  . Smokeless tobacco: Never Used  Vaping Use  . Vaping Use: Never used  Substance and  Sexual Activity  . Alcohol use: No  . Drug use: No  . Sexual activity: Never  Other Topics Concern  . Not on file  Social History Narrative  . Not on file   Social Determinants of Health   Financial Resource Strain: Not on file  Food Insecurity: Not on file  Transportation Needs: Not on file  Physical Activity: Not on file  Stress: Not on file  Social Connections: Not on file    Review of Systems: Gen: Denies fever, chills, anorexia. Denies fatigue, weakness, weight loss.  CV: Denies chest pain, palpitations, syncope, peripheral edema, and claudication. Resp: Denies dyspnea at rest, cough,  wheezing, coughing up blood, and pleurisy. GI: see hPI Derm: Denies rash, itching, dry skin Psych: Denies depression, anxiety, memory loss, confusion. No homicidal or suicidal ideation.  Heme: see HPI  Physical Exam: BP 138/79   Pulse (!) 55   Temp (!) 96.9 F (36.1 C) (Temporal)   Ht 5\' 6"  (1.676 m)   Wt 255 lb 9.6 oz (115.9 kg)   BMI 41.25 kg/m  General:   Alert and oriented. No distress noted. Pleasant and cooperative.  Head:  Normocephalic and atraumatic. Eyes:  Conjuctiva clear without scleral icterus. Mouth:  Mask in place Lungs: clear bilaterally Cardiac: S1 S1 present no murmurs Abdomen:  +BS, soft, non-tender and non-distended. No rebound or guarding. No HSM. Reducible umbilical hernia Rectal: no mass. No external hemorrhoids. Small prolapsing tissue reduced.  Msk:  Symmetrical without gross deformities. Normal posture. Extremities:  Without edema. Neurologic:  Alert and  oriented x4 Psych:  Alert and cooperative. Normal mood and affect.  ASSESSMENT: [name] is a 65 y.o. male presenting today with painless low-volume hematochezia, most likely related to internal hemorrhoids. He has had a colonoscopy as of June 2021, with polyps, no diverticula, and documented hemorrhoids. Denies concerning features such as rectal pain, abdominal pain, fatigue, N/V, etc.  He is very concerned today and wants another evaluation endoscopically. He is quite determined to have this done despite reassurance and findings of internal hemorrhoids today on exam.   At this point, we can pursue a flexible sigmoidoscopy. No known diverticular disease, so I highly doubt he has developed diverticula in interim and dealing with diverticular bleed. Also not clinically consistent with ischemic colitis. I discussed banding at this time, but he wants to wait until after the procedure.    PLAN:  Check CBC today Flex sig with Dr. Abbey Chatters. Risks and benefits discussed with stated understanding Anusol  cream BID per rectum Follow-up thereafter for likely hemorrhoid banding   Annitta Needs, PhD, ANP-BC [facility]

## 2020-07-29 NOTE — Telephone Encounter (Signed)
Called pt to schedule flex sig, carver, asa 2 on 2/24 at 7:30am, arrival 6:15am. Aware will need covid test prior. Scheduled for 2/22 at [place] will mail instructions. Confirmed mailing address.

## 2020-08-01 ENCOUNTER — Other Ambulatory Visit: Payer: Self-pay

## 2020-08-01 ENCOUNTER — Inpatient Hospital Stay
Admission: RE | Admit: 2020-08-01 | Discharge: 2020-08-01 | Disposition: A | Discharge status: Home / Self Care | Payer: Medicare Other | Source: Ambulatory Visit | Attending: Orthopaedic Surgery | Admitting: Orthopaedic Surgery

## 2020-08-01 ENCOUNTER — Ambulatory Visit
Admission: RE | Admit: 2020-08-01 | Discharge: 2020-08-01 | Disposition: A | Discharge status: Home / Self Care | Payer: Medicare Other | Source: Ambulatory Visit | Attending: Orthopaedic Surgery | Admitting: Orthopaedic Surgery

## 2020-08-01 DIAGNOSIS — M4316 Spondylolisthesis, lumbar region: Secondary | ICD-10-CM

## 2020-08-01 DIAGNOSIS — M5136 Other intervertebral disc degeneration, lumbar region: Secondary | ICD-10-CM

## 2020-08-01 DIAGNOSIS — M47817 Spondylosis without myelopathy or radiculopathy, lumbosacral region: Secondary | ICD-10-CM | POA: Diagnosis not present

## 2020-08-01 IMAGING — XA Imaging study
2 series · 2 of 2 positions shown · non-contrast
Comparison: none

CLINICAL DATA: Lumbosacral spondylosis without myelopathy.
Bilateral buttock and posterior leg pain. 100% relief for 4-5 months
after prior L4-L5 epidural injection last [REDACTED].

[Series 1: ortho adipose · 1 of 1 slices shown (1 of 2)]
[im 1/1]
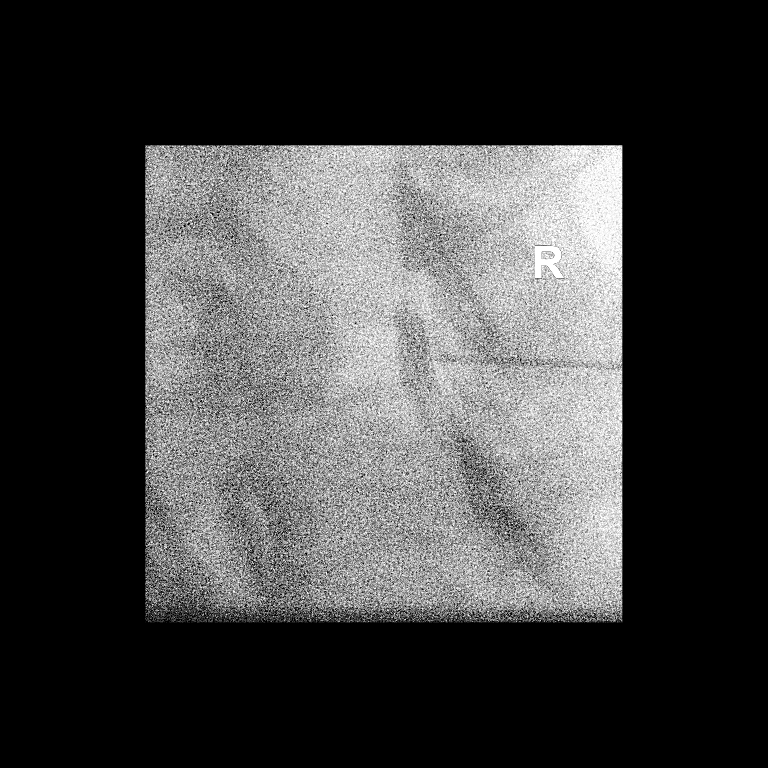

[Series 2: ortho adipose · 1 of 1 slices shown (2 of 2)]
[im 1/1]
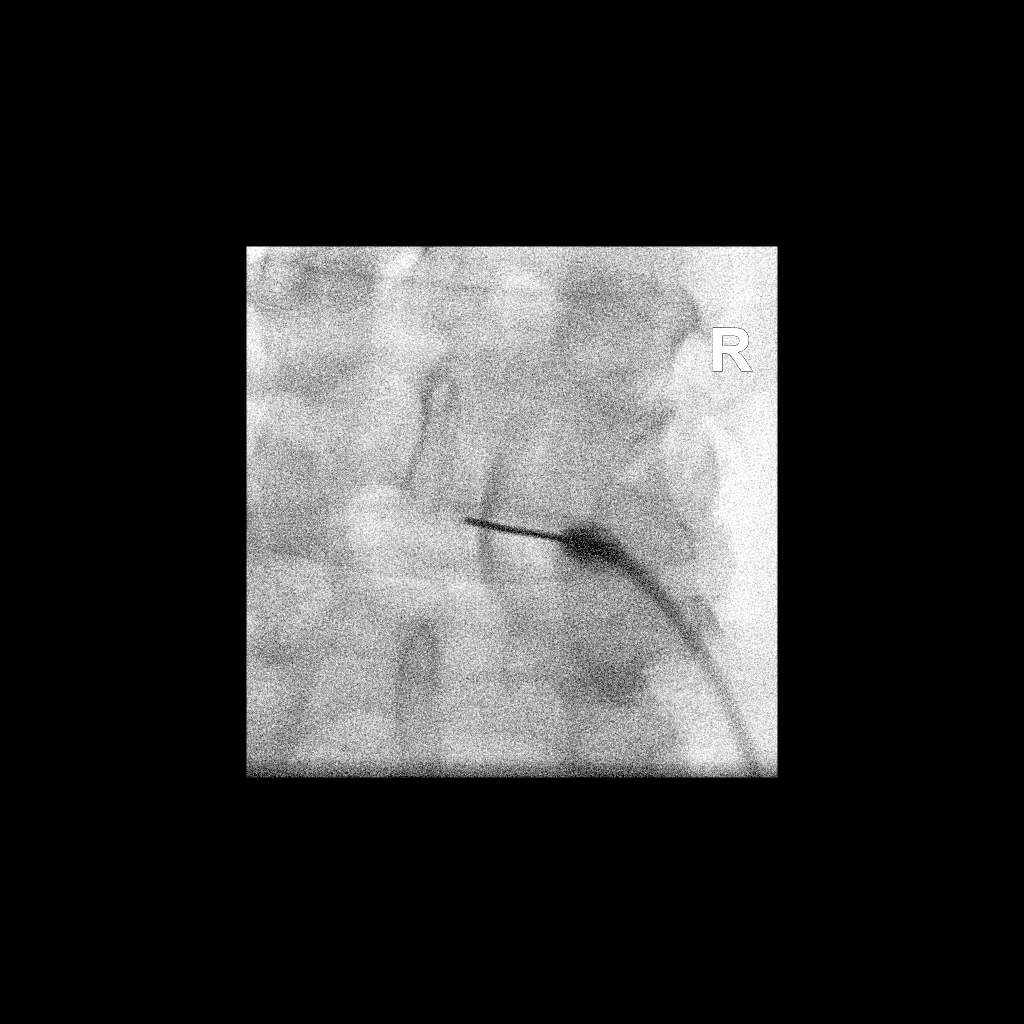

[2 of 2 positions shown; findings below may reference images not displayed]

FLUOROSCOPY TIME:  Radiation Exposure Index (as provided by the
fluoroscopic device): 4.4 mGy

Fluoroscopy Time:  19 seconds

Number of Acquired Images:  0

PROCEDURE:
The procedure, risks, benefits, and alternatives were explained to
the patient. Questions regarding the procedure were encouraged and
answered. The patient understands and consents to the procedure.

LUMBAR EPIDURAL INJECTION:

An interlaminar approach was performed on the right at L4-L5. The
overlying skin was cleansed and anesthetized. A 3.5 inch 20 gauge
epidural needle was advanced using loss-of-resistance technique.

DIAGNOSTIC EPIDURAL INJECTION:

Injection of Isovue-M 200 shows a good epidural pattern with spread
above and below the level of needle placement, primarily on the
right. No vascular opacification is seen.

THERAPEUTIC EPIDURAL INJECTION:

120 mg of Depo-Medrol mixed with 3 mL of 1% lidocaine were
instilled. The procedure was well-tolerated, and the patient was
discharged thirty minutes following the injection in good condition.

COMPLICATIONS:
None immediate.
IMPRESSION: Technically successful interlaminar epidural injection on the right
at L4-L5.

## 2020-08-01 MED ORDER — IOPAMIDOL (ISOVUE-M 200) INJECTION 41%
1.0000 mL | Freq: Once | INTRAMUSCULAR | Status: AC
  Administered 2020-08-01: 1 mL via EPIDURAL

## 2020-08-01 MED ORDER — METHYLPREDNISOLONE ACETATE 40 MG/ML INJ SUSP (RADIOLOG
120.0000 mg | Freq: Once | INTRAMUSCULAR | Status: AC
  Administered 2020-08-01: 120 mg via EPIDURAL

## 2020-08-01 NOTE — Discharge Instructions (Signed)

## 2020-08-09 ENCOUNTER — Other Ambulatory Visit: Payer: Self-pay

## 2020-08-09 ENCOUNTER — Telehealth: Payer: Self-pay

## 2020-08-09 NOTE — Telephone Encounter (Signed)
Patient called. He received an ESI a week ago. He is still having muscle spasms. He was wanting to know if he can get another injection. Dr.Whitfield said that it is to early. I ask him to call us at the end of the month if he is still hurting and we will order another ESI. He cannot take any muscle relaxers due to allergies. He will continue with the Tylenol as needed.

## 2020-08-23 ENCOUNTER — Other Ambulatory Visit: Payer: Self-pay

## 2020-08-23 ENCOUNTER — Other Ambulatory Visit (HOSPITAL_COMMUNITY)
Admission: RE | Admit: 2020-08-23 | Discharge: 2020-08-23 | Disposition: A | Discharge status: Home / Self Care | Payer: Medicare Other | Source: Ambulatory Visit | Attending: Internal Medicine | Admitting: Internal Medicine

## 2020-08-23 DIAGNOSIS — Z20822 Contact with and (suspected) exposure to covid-19: Secondary | ICD-10-CM | POA: Diagnosis not present

## 2020-08-23 DIAGNOSIS — Z01812 Encounter for preprocedural laboratory examination: Secondary | ICD-10-CM | POA: Insufficient documentation

## 2020-08-23 LAB — BASIC METABOLIC PANEL
Anion gap: 7 (ref 5–15)
BUN: 10 mg/dL (ref 8–23)
CO2: 28 mmol/L (ref 22–32)
Calcium: 9 mg/dL (ref 8.9–10.3)
Chloride: 102 mmol/L (ref 98–111)
Creatinine, Ser: 0.82 mg/dL (ref 0.61–1.24)
GFR, Estimated: >60 mL/min (ref >60)
Glucose, Bld: 116 mg/dL — ABNORMAL HIGH (ref 70–99)
Potassium: 3.8 mmol/L (ref 3.5–5.1)
Sodium: 137 mmol/L (ref 135–145)

## 2020-08-23 LAB — SARS CORONAVIRUS 2 (TAT 6-24 HRS): SARS Coronavirus 2: NEGATIVE

## 2020-08-25 ENCOUNTER — Other Ambulatory Visit: Payer: Self-pay

## 2020-08-25 ENCOUNTER — Telehealth: Payer: Self-pay | Admitting: Internal Medicine

## 2020-08-25 ENCOUNTER — Ambulatory Visit (HOSPITAL_COMMUNITY): Payer: Medicare Other | Admitting: Certified Registered Nurse Anesthetist

## 2020-08-25 ENCOUNTER — Encounter (HOSPITAL_COMMUNITY)
Admission: RE | Disposition: A | Discharge status: Home / Self Care | Payer: Self-pay | Source: Home / Self Care | Attending: Internal Medicine

## 2020-08-25 ENCOUNTER — Encounter (HOSPITAL_COMMUNITY): Payer: Self-pay

## 2020-08-25 ENCOUNTER — Ambulatory Visit (HOSPITAL_COMMUNITY)
Admission: RE | Admit: 2020-08-25 | Discharge: 2020-08-25 | Disposition: A | Discharge status: Home / Self Care | Payer: Medicare Other | Attending: Internal Medicine | Admitting: Internal Medicine

## 2020-08-25 DIAGNOSIS — Z86010 Personal history of colonic polyps: Secondary | ICD-10-CM | POA: Insufficient documentation

## 2020-08-25 DIAGNOSIS — Z7984 Long term (current) use of oral hypoglycemic drugs: Secondary | ICD-10-CM | POA: Diagnosis not present

## 2020-08-25 DIAGNOSIS — K921 Melena: Secondary | ICD-10-CM

## 2020-08-25 DIAGNOSIS — Z791 Long term (current) use of non-steroidal anti-inflammatories (NSAID): Secondary | ICD-10-CM | POA: Insufficient documentation

## 2020-08-25 DIAGNOSIS — Z888 Allergy status to other drugs, medicaments and biological substances status: Secondary | ICD-10-CM | POA: Diagnosis not present

## 2020-08-25 DIAGNOSIS — M5136 Other intervertebral disc degeneration, lumbar region: Secondary | ICD-10-CM

## 2020-08-25 DIAGNOSIS — Z96651 Presence of right artificial knee joint: Secondary | ICD-10-CM | POA: Diagnosis not present

## 2020-08-25 DIAGNOSIS — I10 Essential (primary) hypertension: Secondary | ICD-10-CM | POA: Insufficient documentation

## 2020-08-25 DIAGNOSIS — Z9852 Vasectomy status: Secondary | ICD-10-CM | POA: Insufficient documentation

## 2020-08-25 DIAGNOSIS — M4316 Spondylolisthesis, lumbar region: Secondary | ICD-10-CM

## 2020-08-25 DIAGNOSIS — E119 Type 2 diabetes mellitus without complications: Secondary | ICD-10-CM | POA: Insufficient documentation

## 2020-08-25 DIAGNOSIS — Z79899 Other long term (current) drug therapy: Secondary | ICD-10-CM | POA: Diagnosis not present

## 2020-08-25 DIAGNOSIS — Z8619 Personal history of other infectious and parasitic diseases: Secondary | ICD-10-CM | POA: Diagnosis not present

## 2020-08-25 DIAGNOSIS — K911 Postgastric surgery syndromes: Secondary | ICD-10-CM | POA: Diagnosis not present

## 2020-08-25 HISTORY — PX: FLEXIBLE SIGMOIDOSCOPY: SHX5431

## 2020-08-25 LAB — GLUCOSE, CAPILLARY: Glucose-Capillary: 100 mg/dL — ABNORMAL HIGH (ref 70–99)

## 2020-08-25 SURGERY — SIGMOIDOSCOPY, FLEXIBLE
Anesthesia: General

## 2020-08-25 MED ORDER — PROPOFOL 10 MG/ML IV BOLUS
INTRAVENOUS | Status: DC | PRN
  Administered 2020-08-25: 100 mg via INTRAVENOUS

## 2020-08-25 MED ORDER — STERILE WATER FOR IRRIGATION IR SOLN
Status: DC | PRN
  Administered 2020-08-25: 100 mL

## 2020-08-25 MED ORDER — LACTATED RINGERS IV SOLN: INTRAVENOUS | Status: DC

## 2020-08-25 NOTE — Transfer of Care (Signed)
Immediate Anesthesia Transfer of Care Note  Patient: TALON REGALA  Procedure(s) Performed: FLEXIBLE SIGMOIDOSCOPY WITH PROPOFOL (N/A )  Patient Location: PACU  Anesthesia Type:General  Level of Consciousness: awake, alert  and oriented  Airway & Oxygen Therapy: Patient Spontanous Breathing  Post-op Assessment: Report given to RN and Post -op Vital signs reviewed and stable  Post vital signs: Reviewed and stable  Last Vitals:  Vitals Value Taken Time  BP 135/82 08/25/20 0744  Temp 36.9 C 08/25/20 0744  Pulse 59 08/25/20 0744  Resp 20 08/25/20 0744  SpO2      Last Pain:  Vitals:   08/25/20 0744  TempSrc: Oral  PainSc:       Patients Stated Pain Goal: 6 (77/41/42 3953)  Complications: No complications documented.

## 2020-08-25 NOTE — Op Note (Signed)
[facility] Patient Name: [name] Procedure Date: 08/25/2020 7:10 AM MRN: [id number] Date of Birth: 21-Jan-1956 Attending MD: Elon Alas. Abbey Chatters DO CSN: [id number] Age: 65 Admit Type: Outpatient Procedure:                Flexible Sigmoidoscopy Indications:              Hematochezia Providers:                Elon Alas. Abbey Chatters, DO, Tammy Vaught, RN, Nelma Rothman,                            Technician Referring MD:              Medicines:                See the Anesthesia note for documentation of the                            administered medications Complications:            No immediate complications. Estimated Blood Loss:     Estimated blood loss: none. Procedure:                Pre-Anesthesia Assessment:                           - The anesthesia plan was to use monitored                            anesthesia care (MAC).                           After obtaining informed consent, the scope was                            passed under direct vision. The PCF-HQ190L                            ([id number]) scope was introduced through the anus and                            advanced to the the sigmoid colon. The flexible                            sigmoidoscopy was accomplished without difficulty.                            The patient tolerated the procedure well. The                            quality of the bowel preparation was unsatisfactory. Scope In: 7:40:02 AM Scope Out: 7:41:15 AM Total Procedure Duration: 0 hours 1 minute 13 seconds  Findings:      The perianal and digital rectal examinations were normal.      Extensive amounts of stool was found in the rectum and in the sigmoid       colon, precluding visualization. Impression:               -  Preparation of the colon was unsatisfactory.                           - Stool in the rectum and in the sigmoid colon.                           - No specimens collected. Moderate Sedation:      Per Anesthesia  Care Recommendation:           - Discharge patient to home.                           - Resume previous diet.                           - Continue present medications.                           - Exam today was inadequate due to extensive amount                            of stool in your rectum and sigmoid colon. We will                            plan on repeat in the near future with a different                            colon preparation. Procedure Code(s):        --- Professional ---                           [phone], Sigmoidoscopy, flexible; diagnostic,                            including collection of specimen(s) by brushing or                            washing, when performed (separate procedure) Diagnosis Code(s):        --- Professional ---                           K92.1, Melena (includes Hematochezia) CPT copyright 2019 American Medical Association. All rights reserved. The codes documented in this report are preliminary and upon coder review may  be revised to meet current compliance requirements. Elon Alas. Abbey Chatters, DO [name], DO 08/25/2020 8:18:33 AM This report has been signed electronically. Number of Addenda: 0

## 2020-08-25 NOTE — Telephone Encounter (Signed)
Good morning, this patient presented for flexible sigmoidoscopy today.  Unfortunately his entire colon was full of stool and examination was inadequate.  I discussed this with patient after the procedure and he would like to repeat it.  Can we get him set up for repeat flexible sigmoidoscopy with me?  He will need a different colon preparation this time than prior.  Thank you!

## 2020-08-25 NOTE — Anesthesia Preprocedure Evaluation (Signed)
Anesthesia Evaluation  Patient identified by MRN, date of birth, ID band Patient awake    Reviewed: Allergy & Precautions, H&P , NPO status , Patient's Chart, lab work & pertinent test results, reviewed documented beta blocker date and time   Airway Mallampati: II  TM Distance: >3 FB Neck ROM: full    Dental no notable dental hx.    Pulmonary pneumonia, resolved,    Pulmonary exam normal breath sounds clear to auscultation       Cardiovascular Exercise Tolerance: Good hypertension, negative cardio ROS   Rhythm:regular Rate:Normal     Neuro/Psych negative neurological ROS  negative psych ROS   GI/Hepatic negative GI ROS, Neg liver ROS,   Endo/Other  diabetes, Well Controlled, Type 2Morbid obesity  Renal/GU negative Renal ROS  negative genitourinary   Musculoskeletal   Abdominal   Peds  Hematology negative hematology ROS (+)   Anesthesia Other Findings   Reproductive/Obstetrics negative OB ROS                             Anesthesia Physical Anesthesia Plan  ASA: III  Anesthesia Plan: General   Post-op Pain Management:    Induction:   PONV Risk Score and Plan: Propofol infusion  Airway Management Planned:   Additional Equipment:   Intra-op Plan:   Post-operative Plan:   Informed Consent: I have reviewed the patients History and Physical, chart, labs and discussed the procedure including the risks, benefits and alternatives for the proposed anesthesia with the patient or authorized representative who has indicated his/her understanding and acceptance.     Dental Advisory Given  Plan Discussed with: CRNA  Anesthesia Plan Comments:         Anesthesia Quick Evaluation

## 2020-08-25 NOTE — Anesthesia Postprocedure Evaluation (Signed)
Anesthesia Post Note  Patient: [name]  Procedure(s) Performed: FLEXIBLE SIGMOIDOSCOPY WITH PROPOFOL (N/A )  Patient location during evaluation: Phase II Anesthesia Type: General Level of consciousness: awake and oriented Pain management: satisfactory to patient Vital Signs Assessment: post-procedure vital signs reviewed and stable Respiratory status: spontaneous breathing and respiratory function stable Cardiovascular status: blood pressure returned to baseline and stable Postop Assessment: no apparent nausea or vomiting Anesthetic complications: no   No complications documented.   Last Vitals:  Vitals:   08/25/20 0649 08/25/20 0744  BP: 135/73 135/82  Pulse: 62 (!) 59  Resp: 19 20  Temp: 37 C 36.9 C  SpO2: 97%     Last Pain:  Vitals:   08/25/20 0744  TempSrc: Oral  PainSc:                  Karna Dupes

## 2020-08-25 NOTE — Telephone Encounter (Signed)
[name], please advise as for flex sig only prep is fleet enemas. Thanks!

## 2020-08-25 NOTE — Telephone Encounter (Signed)
Although flex sig is normally enemas, we can go ahead and use a Miralax prep day before to ensure he is cleaned out, with enemas in morning as would normally do.

## 2020-08-25 NOTE — Interval H&P Note (Signed)
History and Physical Interval Note:  08/25/2020 7:31 AM  [name]  has presented today for surgery, with the diagnosis of rectal bleeding.  The various methods of treatment have been discussed with the patient and family. After consideration of risks, benefits and other options for treatment, the patient has consented to  Procedure(s) with comments: FLEXIBLE SIGMOIDOSCOPY WITH PROPOFOL (N/A) - 7:30am as a surgical intervention.  The patient's history has been reviewed, patient examined, no change in status, stable for surgery.  I have reviewed the patient's chart and labs.  Questions were answered to the patient's satisfaction.     [name]

## 2020-08-25 NOTE — Telephone Encounter (Signed)
Called pt. He has been rescheduled to 4/1, am appt. Advised will need to do miralax prep for procedure. Advised will mail new instructions.

## 2020-08-25 NOTE — Discharge Instructions (Addendum)
  Flexible sigmoidoscopy Discharge Instructions  Read the instructions outlined below and refer to this sheet in the next few weeks. These discharge instructions provide you with general information on caring for yourself after you leave the hospital. Your doctor may also give you specific instructions. While your treatment has been planned according to the most current medical practices available, unavoidable complications occasionally occur.   ACTIVITY  You may resume your regular activity, but move at a slower pace for the next 24 hours.   Take frequent rest periods for the next 24 hours.   Walking will help get rid of the air and reduce the bloated feeling in your belly (abdomen).   No driving for 24 hours (because of the medicine (anesthesia) used during the test).    Do not sign any important legal documents or operate any machinery for 24 hours (because of the anesthesia used during the test).  NUTRITION  Drink plenty of fluids.   You may resume your normal diet as instructed by your doctor.   Begin with a light meal and progress to your normal diet. Heavy or fried foods are harder to digest and may make you feel sick to your stomach (nauseated).   Avoid alcoholic beverages for 24 hours or as instructed.  MEDICATIONS  You may resume your normal medications unless your doctor tells you otherwise.  WHAT YOU CAN EXPECT TODAY  Some feelings of bloating in the abdomen.   Passage of more gas than usual.   Spotting of blood in your stool or on the toilet paper.  IF YOU HAD POLYPS REMOVED DURING THE COLONOSCOPY:  No aspirin products for 7 days or as instructed.   No alcohol for 7 days or as instructed.   Eat a soft diet for the next 24 hours.  FINDING OUT THE RESULTS OF YOUR TEST Not all test results are available during your visit. If your test results are not back during the visit, make an appointment with your caregiver to find out the results. Do not assume everything is  normal if you have not heard from your caregiver or the medical facility. It is important for you to follow up on all of your test results.  SEEK IMMEDIATE MEDICAL ATTENTION IF:  You have more than a spotting of blood in your stool.   Your belly is swollen (abdominal distention).   You are nauseated or vomiting.   You have a temperature over 101.   You have abdominal pain or discomfort that is severe or gets worse throughout the day.   Unfortunately your colon was not cleaned out.  There was a large amount of stool in your rectum extending into the sigmoid colon.  I was able to get a good visualization of the rectal vault itself and it does appear that she have internal hemorrhoids.  We will plan on repeat flexible sigmoidoscopy in the future and hopefully you will be cleaned out better next time.  I hope you have a great rest of your week!  Elon Alas. Abbey Chatters, D.O. Gastroenterology and Hepatology [facility]

## 2020-08-30 ENCOUNTER — Encounter (HOSPITAL_COMMUNITY): Payer: Self-pay | Admitting: Internal Medicine

## 2020-09-02 ENCOUNTER — Other Ambulatory Visit: Payer: Medicare Other

## 2020-09-07 DIAGNOSIS — M5136 Other intervertebral disc degeneration, lumbar region: Secondary | ICD-10-CM | POA: Diagnosis not present

## 2020-09-07 DIAGNOSIS — M4316 Spondylolisthesis, lumbar region: Secondary | ICD-10-CM | POA: Diagnosis not present

## 2020-09-07 DIAGNOSIS — E1165 Type 2 diabetes mellitus with hyperglycemia: Secondary | ICD-10-CM | POA: Diagnosis not present

## 2020-09-07 DIAGNOSIS — M791 Myalgia, unspecified site: Secondary | ICD-10-CM | POA: Diagnosis not present

## 2020-09-07 DIAGNOSIS — M545 Low back pain, unspecified: Secondary | ICD-10-CM | POA: Diagnosis not present

## 2020-09-07 DIAGNOSIS — M7601 Gluteal tendinitis, right hip: Secondary | ICD-10-CM | POA: Diagnosis not present

## 2020-09-07 DIAGNOSIS — I10 Essential (primary) hypertension: Secondary | ICD-10-CM | POA: Diagnosis not present

## 2020-09-07 DIAGNOSIS — M25561 Pain in right knee: Secondary | ICD-10-CM | POA: Diagnosis not present

## 2020-09-08 ENCOUNTER — Telehealth: Payer: Self-pay | Admitting: Orthopaedic Surgery

## 2020-09-08 NOTE — Telephone Encounter (Signed)
Spoke with patient. Advised him to call [facility] to schedule because order was placed almost 2 weeks ago.

## 2020-09-08 NOTE — Telephone Encounter (Signed)
Patient called for Dr. Durward Fortes at [facility] office. Patient goes to [place] office. Patient is asking for a referral be sent to imaging for back injections. Unsure of location for imaging. Please call patient at [phone].

## 2020-09-11 ENCOUNTER — Emergency Department (HOSPITAL_COMMUNITY)
Admission: EM | Admit: 2020-09-11 | Discharge: 2020-09-11 | Discharge status: Against Medical Advice | Payer: Medicare Other

## 2020-09-15 ENCOUNTER — Ambulatory Visit
Admission: RE | Admit: 2020-09-15 | Discharge: 2020-09-15 | Disposition: A | Discharge status: Home / Self Care | Payer: Medicare Other | Source: Ambulatory Visit | Attending: Orthopaedic Surgery | Admitting: Orthopaedic Surgery

## 2020-09-15 ENCOUNTER — Other Ambulatory Visit: Payer: Self-pay

## 2020-09-15 DIAGNOSIS — M5136 Other intervertebral disc degeneration, lumbar region: Secondary | ICD-10-CM

## 2020-09-15 DIAGNOSIS — M4316 Spondylolisthesis, lumbar region: Secondary | ICD-10-CM

## 2020-09-15 DIAGNOSIS — M47817 Spondylosis without myelopathy or radiculopathy, lumbosacral region: Secondary | ICD-10-CM | POA: Diagnosis not present

## 2020-09-15 IMAGING — XA Imaging study
2 series · 2 of 2 positions shown · non-contrast
Comparison: none

CLINICAL DATA: Lumbosacral spondylosis without myelopathy. Positive
response to prior epidural injections. Recurrent pain in the low
back and legs, right greater than left.

[Series 1: ortho adipose · 1 of 1 slices shown (1 of 2)]
[im 1/1]
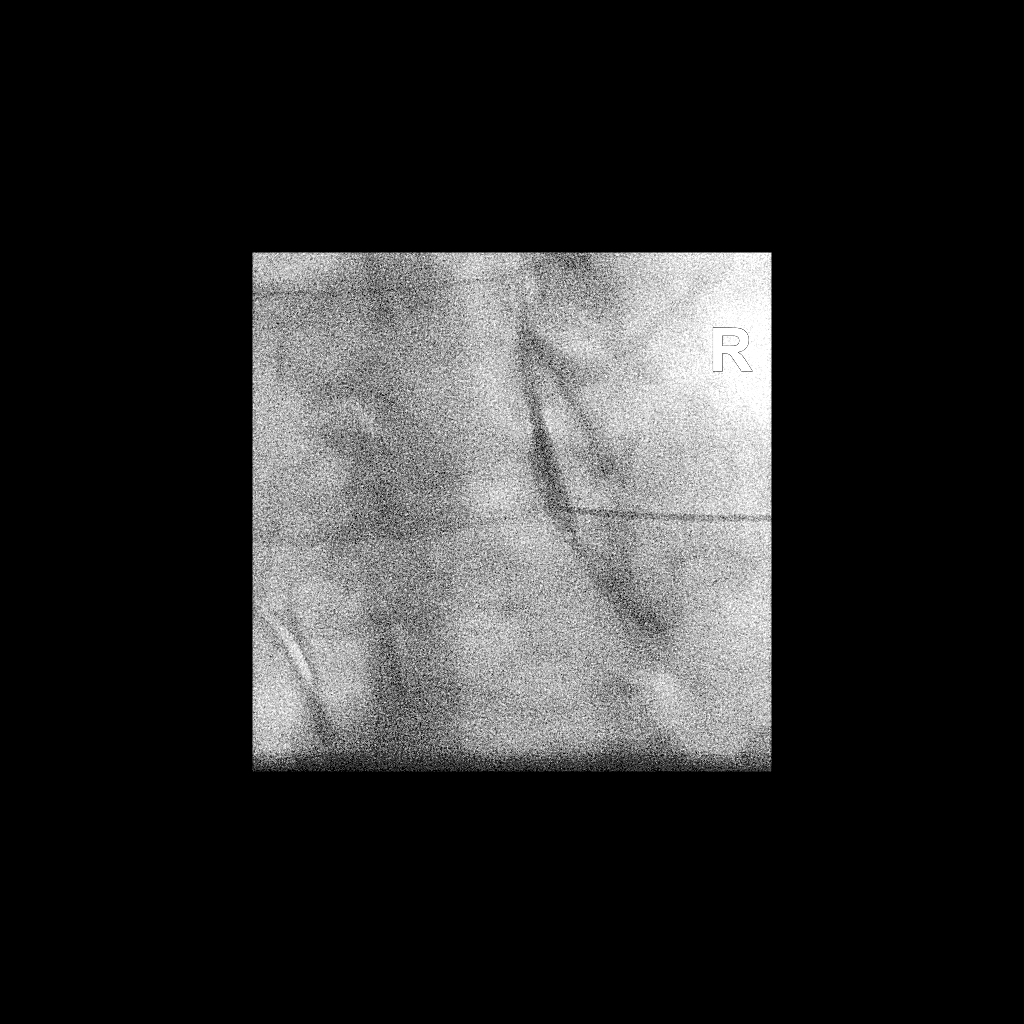

[Series 2: ortho adipose · 1 of 1 slices shown (2 of 2)]
[im 1/1]
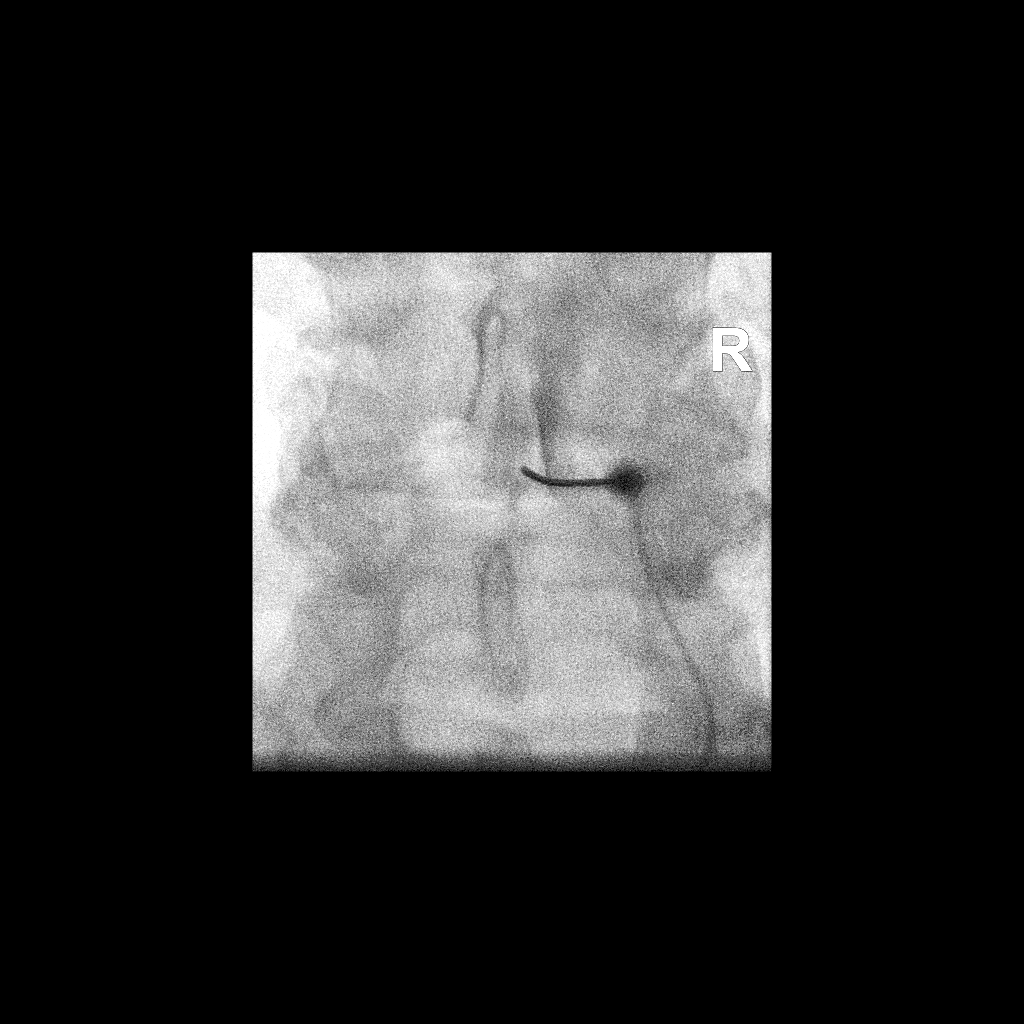

[2 of 2 positions shown; findings below may reference images not displayed]

FLUOROSCOPY TIME:  Fluoroscopy Time: 18 seconds

Radiation Exposure Index: 22.48 microGray*m^2

PROCEDURE:
The procedure, risks, benefits, and alternatives were explained to
the patient. Questions regarding the procedure were encouraged and
answered. The patient understands and consents to the procedure.

LUMBAR EPIDURAL INJECTION:

An interlaminar approach was performed on the right at L4-5. The
overlying skin was cleansed and anesthetized. A 3.5 inch 20 gauge
epidural needle was advanced using loss-of-resistance technique.

DIAGNOSTIC EPIDURAL INJECTION:

Injection of Isovue-M 200 shows a good epidural pattern with spread
above and below the level of needle placement, primarily on the
right. No vascular opacification is seen.

THERAPEUTIC EPIDURAL INJECTION:

120 mg of Depo-Medrol mixed with 3 mL of 1% lidocaine were
instilled. The procedure was well-tolerated, and the patient was
discharged thirty minutes following the injection in good condition.

COMPLICATIONS:
None
IMPRESSION: Technically successful lumbar interlaminar epidural injection on the
right at L4-5.

## 2020-09-15 MED ORDER — IOPAMIDOL (ISOVUE-M 200) INJECTION 41%
1.0000 mL | Freq: Once | INTRAMUSCULAR | Status: AC
  Administered 2020-09-15: 1 mL via EPIDURAL

## 2020-09-15 MED ORDER — METHYLPREDNISOLONE ACETATE 40 MG/ML INJ SUSP (RADIOLOG
120.0000 mg | Freq: Once | INTRAMUSCULAR | Status: AC
  Administered 2020-09-15: 120 mg via EPIDURAL

## 2020-09-15 NOTE — Discharge Instructions (Signed)

## 2020-09-23 ENCOUNTER — Encounter (HOSPITAL_COMMUNITY): Payer: Self-pay

## 2020-09-28 ENCOUNTER — Other Ambulatory Visit (HOSPITAL_COMMUNITY): Payer: Medicare Other

## 2020-09-28 ENCOUNTER — Other Ambulatory Visit (HOSPITAL_COMMUNITY)
Admission: RE | Admit: 2020-09-28 | Discharge: 2020-09-28 | Disposition: A | Discharge status: Home / Self Care | Payer: Medicare Other | Source: Ambulatory Visit | Attending: Internal Medicine | Admitting: Internal Medicine

## 2020-09-28 ENCOUNTER — Other Ambulatory Visit: Payer: Self-pay

## 2020-09-28 DIAGNOSIS — Z20822 Contact with and (suspected) exposure to covid-19: Secondary | ICD-10-CM | POA: Insufficient documentation

## 2020-09-28 DIAGNOSIS — Z01812 Encounter for preprocedural laboratory examination: Secondary | ICD-10-CM | POA: Insufficient documentation

## 2020-09-28 LAB — SARS CORONAVIRUS 2 (TAT 6-24 HRS): SARS Coronavirus 2: NEGATIVE

## 2020-09-30 ENCOUNTER — Encounter (HOSPITAL_COMMUNITY)
Admission: RE | Disposition: A | Discharge status: Home / Self Care | Payer: Self-pay | Source: Home / Self Care | Attending: Internal Medicine

## 2020-09-30 ENCOUNTER — Ambulatory Visit (HOSPITAL_COMMUNITY): Payer: Medicare Other | Admitting: Anesthesiology

## 2020-09-30 ENCOUNTER — Ambulatory Visit (HOSPITAL_COMMUNITY)
Admission: RE | Admit: 2020-09-30 | Discharge: 2020-09-30 | Disposition: A | Discharge status: Home / Self Care | Payer: Medicare Other | Attending: Internal Medicine | Admitting: Internal Medicine

## 2020-09-30 ENCOUNTER — Other Ambulatory Visit: Payer: Self-pay

## 2020-09-30 ENCOUNTER — Encounter (HOSPITAL_COMMUNITY): Payer: Self-pay

## 2020-09-30 DIAGNOSIS — Z9852 Vasectomy status: Secondary | ICD-10-CM | POA: Diagnosis not present

## 2020-09-30 DIAGNOSIS — Z86010 Personal history of colonic polyps: Secondary | ICD-10-CM | POA: Insufficient documentation

## 2020-09-30 DIAGNOSIS — Z888 Allergy status to other drugs, medicaments and biological substances status: Secondary | ICD-10-CM | POA: Diagnosis not present

## 2020-09-30 DIAGNOSIS — Z7984 Long term (current) use of oral hypoglycemic drugs: Secondary | ICD-10-CM | POA: Insufficient documentation

## 2020-09-30 DIAGNOSIS — K648 Other hemorrhoids: Secondary | ICD-10-CM | POA: Insufficient documentation

## 2020-09-30 DIAGNOSIS — K5730 Diverticulosis of large intestine without perforation or abscess without bleeding: Secondary | ICD-10-CM

## 2020-09-30 DIAGNOSIS — Z79899 Other long term (current) drug therapy: Secondary | ICD-10-CM | POA: Diagnosis not present

## 2020-09-30 DIAGNOSIS — K921 Melena: Secondary | ICD-10-CM | POA: Insufficient documentation

## 2020-09-30 DIAGNOSIS — Z96651 Presence of right artificial knee joint: Secondary | ICD-10-CM | POA: Insufficient documentation

## 2020-09-30 DIAGNOSIS — Z8719 Personal history of other diseases of the digestive system: Secondary | ICD-10-CM | POA: Insufficient documentation

## 2020-09-30 DIAGNOSIS — I10 Essential (primary) hypertension: Secondary | ICD-10-CM | POA: Diagnosis not present

## 2020-09-30 DIAGNOSIS — E119 Type 2 diabetes mellitus without complications: Secondary | ICD-10-CM | POA: Diagnosis not present

## 2020-09-30 HISTORY — PX: FLEXIBLE SIGMOIDOSCOPY: SHX5431

## 2020-09-30 LAB — GLUCOSE, CAPILLARY: Glucose-Capillary: 92 mg/dL (ref 70–99)

## 2020-09-30 SURGERY — SIGMOIDOSCOPY, FLEXIBLE
Anesthesia: General

## 2020-09-30 MED ORDER — PROPOFOL 10 MG/ML IV BOLUS
INTRAVENOUS | Status: DC | PRN
  Administered 2020-09-30: 100 mg via INTRAVENOUS
  Administered 2020-09-30: 50 mg via INTRAVENOUS

## 2020-09-30 MED ORDER — LACTATED RINGERS IV SOLN: INTRAVENOUS | Status: DC

## 2020-09-30 NOTE — Discharge Instructions (Addendum)
EGD Discharge instructions Please read the instructions outlined below and refer to this sheet in the next few weeks. These discharge instructions provide you with general information on caring for yourself after you leave the hospital. Your doctor may also give you specific instructions. While your treatment has been planned according to the most current medical practices available, unavoidable complications occasionally occur. If you have any problems or questions after discharge, please call your doctor. ACTIVITY  You may resume your regular activity but move at a slower pace for the next 24 hours.   Take frequent rest periods for the next 24 hours.   Walking will help expel (get rid of) the air and reduce the bloated feeling in your abdomen.   No driving for 24 hours (because of the anesthesia (medicine) used during the test).   You may shower.   Do not sign any important legal documents or operate any machinery for 24 hours (because of the anesthesia used during the test).  NUTRITION  Drink plenty of fluids.   You may resume your normal diet.   Begin with a light meal and progress to your normal diet.   Avoid alcoholic beverages for 24 hours or as instructed by your caregiver.  MEDICATIONS  You may resume your normal medications unless your caregiver tells you otherwise.  WHAT YOU CAN EXPECT TODAY  You may experience abdominal discomfort such as a feeling of fullness or "gas" pains.  FOLLOW-UP  Your doctor will discuss the results of your test with you.  SEEK IMMEDIATE MEDICAL ATTENTION IF ANY OF THE FOLLOWING OCCUR:  Excessive nausea (feeling sick to your stomach) and/or vomiting.   Severe abdominal pain and distention (swelling).   Trouble swallowing.   Temperature over 101 F (37.8 C).   Rectal bleeding or vomiting of blood.    Your flexible sigmoidoscopy was relatively unremarkable.  You do have large internal hemorrhoids which is likely the cause of your  bleeding.  You also have diverticulosis and I would recommend increasing your fiber in diet or taking OTC Benefiber/Metamucil.  Be sure to drink at least 4 to 6 glasses of water daily.  Follow-up with GI in 2 to 3 months.  Recommend hemorrhoid banding if rectal bleeding continues to be an issue.  I hope you have a great rest of your week!  [name]. [name], D.O. Gastroenterology and Hepatology [facility]   Diverticulosis  Diverticulosis is a condition that develops when small pouches (diverticula) form in the wall of the large intestine (colon). The colon is where water is absorbed and stool (feces) is formed. The pouches form when the inside layer of the colon pushes through weak spots in the outer layers of the colon. You may have a few pouches or many of them. The pouches usually do not cause problems unless they become inflamed or infected. When this happens, the condition is called diverticulitis. What are the causes? The cause of this condition is not known. What increases the risk? The following factors may make you more likely to develop this condition:  Being older than age 73. Your risk for this condition increases with age. Diverticulosis is rare among people younger than age 12. By age 73, many people have it.  Eating a low-fiber diet.  Having frequent constipation.  Being overweight.  Not getting enough exercise.  Smoking.  Taking over-the-counter pain medicines, like aspirin and ibuprofen.  Having a family history of diverticulosis. What are the signs or symptoms? In most people, there  are no symptoms of this condition. If you do have symptoms, they may include:  Bloating.  Cramps in the abdomen.  Constipation or diarrhea.  Pain in the lower left side of the abdomen. How is this diagnosed? Because diverticulosis usually has no symptoms, it is most often diagnosed during an exam for other colon problems. The condition may be  diagnosed by:  Using a flexible scope to examine the colon (colonoscopy).  Taking an X-ray of the colon after dye has been put into the colon (barium enema).  Having a CT scan. How is this treated? You may not need treatment for this condition. Your health care provider may recommend treatment to prevent problems. You may need treatment if you have symptoms or if you previously had diverticulitis. Treatment may include:  Eating a high-fiber diet.  Taking a fiber supplement.  Taking a live bacteria supplement (probiotic).  Taking medicine to relax your colon.   Follow these instructions at home: Medicines  Take over-the-counter and prescription medicines only as told by your health care provider.  If told by your health care provider, take a fiber supplement or probiotic. Constipation prevention Your condition may cause constipation. To prevent or treat constipation, you may need to:  Drink enough fluid to keep your urine pale yellow.  Take over-the-counter or prescription medicines.  Eat foods that are high in fiber, such as beans, whole grains, and fresh fruits and vegetables.  Limit foods that are high in fat and processed sugars, such as fried or sweet foods.   General instructions  Try not to strain when you have a bowel movement.  Keep all follow-up visits as told by your health care provider. This is important. Contact a health care provider if you:  Have pain in your abdomen.  Have bloating.  Have cramps.  Have not had a bowel movement in 3 days. Get help right away if:  Your pain gets worse.  Your bloating becomes very bad.  You have a fever or chills, and your symptoms suddenly get worse.  You vomit.  You have bowel movements that are bloody or black.  You have bleeding from your rectum. Summary  Diverticulosis is a condition that develops when small pouches (diverticula) form in the wall of the large intestine (colon).  You may have a few  pouches or many of them.  This condition is most often diagnosed during an exam for other colon problems.  Treatment may include increasing the fiber in your diet, taking supplements, or taking medicines. This information is not intended to replace advice given to you by your health care provider. Make sure you discuss any questions you have with your health care provider. Document Revised: 01/15/2019 Document Reviewed: 01/15/2019 Elsevier Patient Education  2021 [place].  Hemorrhoids Hemorrhoids are swollen veins that may develop:  In the butt (rectum). These are called internal hemorrhoids.  Around the opening of the butt (anus). These are called external hemorrhoids. Hemorrhoids can cause pain, itching, or bleeding. Most of the time, they do not cause serious problems. They usually get better with diet changes, lifestyle changes, and other home treatments. What are the causes? This condition may be caused by:  Having trouble pooping (constipation).  Pushing hard (straining) to poop.  Watery poop (diarrhea).  Pregnancy.  Being very overweight (obese).  Sitting for long periods of time.  Heavy lifting or other activity that causes you to strain.  Anal sex.  Riding a bike for a long period of time. What  are the signs or symptoms? Symptoms of this condition include:  Pain.  Itching or soreness in the butt.  Bleeding from the butt.  Leaking poop.  Swelling in the area.  One or more lumps around the opening of your butt. How is this diagnosed? A doctor can often diagnose this condition by looking at the affected area. The doctor may also:  Do an exam that involves feeling the area with a gloved hand (digital rectal exam).  Examine the area inside your butt using a small tube (anoscope).  Order blood tests. This may be done if you have lost a lot of blood.  Have you get a test that involves looking inside the colon using a flexible tube with a camera on  the end (sigmoidoscopy or colonoscopy). How is this treated? This condition can usually be treated at home. Your doctor may tell you to change what you eat, make lifestyle changes, or try home treatments. If these do not help, procedures can be done to remove the hemorrhoids or make them smaller. These may involve:  Placing rubber bands at the base of the hemorrhoids to cut off their blood supply.  Injecting medicine into the hemorrhoids to shrink them.  Shining a type of light energy onto the hemorrhoids to cause them to fall off.  Doing surgery to remove the hemorrhoids or cut off their blood supply. Follow these instructions at home: Eating and drinking  Eat foods that have a lot of fiber in them. These include whole grains, beans, nuts, fruits, and vegetables.  Ask your doctor about taking products that have added fiber (fibersupplements).  Reduce the amount of fat in your diet. You can do this by: ? Eating low-fat dairy products. ? Eating less red meat. ? Avoiding processed foods.  Drink enough fluid to keep your pee (urine) pale yellow.   Managing pain and swelling  Take a warm-water bath (sitz bath) for 20 minutes to ease pain. Do this 3-4 times a day. You may do this in a bathtub or using a portable sitz bath that fits over the toilet.  If told, put ice on the painful area. It may be helpful to use ice between your warm baths. ? Put ice in a plastic bag. ? Place a towel between your skin and the bag. ? Leave the ice on for 20 minutes, 2-3 times a day.   General instructions  Take over-the-counter and prescription medicines only as told by your doctor. ? Medicated creams and medicines may be used as told.  Exercise often. Ask your doctor how much and what kind of exercise is best for you.  Go to the bathroom when you have the urge to poop. Do not wait.  Avoid pushing too hard when you poop.  Keep your butt dry and clean. Use wet toilet paper or moist towelettes after  pooping.  Do not sit on the toilet for a long time.  Keep all follow-up visits as told by your doctor. This is important. Contact a doctor if you:  Have pain and swelling that do not get better with treatment or medicine.  Have trouble pooping.  Cannot poop.  Have pain or swelling outside the area of the hemorrhoids. Get help right away if you have:  Bleeding that will not stop. Summary  Hemorrhoids are swollen veins in the butt or around the opening of the butt.  They can cause pain, itching, or bleeding.  Eat foods that have a lot of fiber in them.  These include whole grains, beans, nuts, fruits, and vegetables.  Take a warm-water bath (sitz bath) for 20 minutes to ease pain. Do this 3-4 times a day. This information is not intended to replace advice given to you by your health care provider. Make sure you discuss any questions you have with your health care provider. Document Revised: 06/26/2018 Document Reviewed: 11/07/2017 Elsevier Patient Education  [place].

## 2020-09-30 NOTE — Anesthesia Postprocedure Evaluation (Signed)
Anesthesia Post Note  Patient: [name]  Procedure(s) Performed: FLEXIBLE SIGMOIDOSCOPY (N/A )  Patient location during evaluation: Phase II Anesthesia Type: General Level of consciousness: awake and alert Pain management: pain level controlled Vital Signs Assessment: post-procedure vital signs reviewed and stable Respiratory status: spontaneous breathing and respiratory function stable Cardiovascular status: blood pressure returned to baseline and stable Postop Assessment: no apparent nausea or vomiting Anesthetic complications: no   No complications documented.   Last Vitals:  Vitals:   09/30/20 0758  BP: (!) 155/81  Pulse: 72  Resp: 16  Temp: 36.5 C  SpO2: 98%    Last Pain:  Vitals:   09/30/20 0758  TempSrc: Oral  PainSc: 0-No pain                 Karna Dupes

## 2020-09-30 NOTE — Anesthesia Preprocedure Evaluation (Addendum)
Anesthesia Evaluation  Patient identified by MRN, date of birth, ID band Patient awake    Reviewed: Allergy & Precautions, NPO status , Patient's Chart, lab work & pertinent test results  History of Anesthesia Complications Negative for: history of anesthetic complications  Airway Mallampati: III   Neck ROM: Full    Dental  (+) Dental Advisory Given, Missing, Poor Dentition, Chipped   Pulmonary pneumonia,    Pulmonary exam normal breath sounds clear to auscultation       Cardiovascular Exercise Tolerance: Good hypertension, Pt. on medications Normal cardiovascular exam Rhythm:Regular Rate:Normal     Neuro/Psych negative neurological ROS  negative psych ROS   GI/Hepatic negative GI ROS, Neg liver ROS,   Endo/Other  diabetes, Well Controlled, Type 2, Oral Hypoglycemic Agents  Renal/GU negative Renal ROS     Musculoskeletal  (+) Arthritis , Osteoarthritis,    Abdominal   Peds  Hematology negative hematology ROS (+)   Anesthesia Other Findings   Reproductive/Obstetrics                            Anesthesia Physical Anesthesia Plan  ASA: II  Anesthesia Plan: General   Post-op Pain Management:    Induction: Intravenous  PONV Risk Score and Plan: Propofol infusion  Airway Management Planned: Nasal Cannula and Natural Airway  Additional Equipment:   Intra-op Plan:   Post-operative Plan:   Informed Consent: I have reviewed the patients History and Physical, chart, labs and discussed the procedure including the risks, benefits and alternatives for the proposed anesthesia with the patient or authorized representative who has indicated his/her understanding and acceptance.     Dental advisory given  Plan Discussed with: CRNA and Surgeon  Anesthesia Plan Comments:         Anesthesia Quick Evaluation

## 2020-09-30 NOTE — H&P (Signed)
Primary Care Physician:  Celene Squibb, MD Primary Gastroenterologist:  Dr. Abbey Chatters  Pre-Procedure History & Physical: HPI:  [name] is a 65 y.o. male is here for flexible sigmoidoscopy for rectal bleeding. He history of adenomas, rectal bleeding, recent colonoscopy with three 4-5 mm polyps (hyperplastic and benign small bowel mucosa) in sigmoid colon at IC valve, redundant colon, non-bleeding internal hemorrhoids.   Has had intermittent rectal bleeding recently. No rectal discomfort.  No itching. No pressure. BM two to 3 times per day. No straining. No prolonged sitting on toilet. No abdominal pain. Chronic back pain. Short term dose of prednisone for back.   Past Medical History:  Diagnosis Date  . Arthritis    "right knee" (07/30/2017)  . CAP (community acquired pneumonia) 2018  . Diabetes mellitus without complication ([place])   . Hypertension   . Hypertension   . Wears glasses     Past Surgical History:  Procedure Laterality Date  . COLONOSCOPY  2009   Fields: 3 small tubular adenomas removed.  . COLONOSCOPY N/A 12/02/2019    three 4-5 mm polyps (hyperplastic and benign small bowel mucosa) in sigmoid colon at IC valve, redundant colon, non-bleeding internal hemorrhoids.   Marland Kitchen EYE SURGERY    . FLEXIBLE SIGMOIDOSCOPY N/A 08/25/2020   Procedure: FLEXIBLE SIGMOIDOSCOPY WITH PROPOFOL;  Surgeon: [name], DO;  Location: [facility];  Service: Endoscopy;  Laterality: N/A;  7:30am  . JOINT REPLACEMENT    . POLYPECTOMY  12/02/2019   Procedure: POLYPECTOMY;  Surgeon: Daneil Dolin, MD;  Location: [facility];  Service: Endoscopy;;  . RETINAL DETACHMENT SURGERY Left 2000s  . TOTAL KNEE ARTHROPLASTY Right 07/30/2017  . TOTAL KNEE ARTHROPLASTY Right 07/30/2017   Procedure: RIGHT TOTAL KNEE ARTHROPLASTY;  Surgeon: [name], MD;  Location: [facility];  Service: Orthopedics;  Laterality: Right;  Marland Kitchen VASECTOMY      Prior to Admission medications   Medication Sig Start Date  End Date Taking? Authorizing Provider  acetaminophen (TYLENOL) 325 MG tablet Take 2 tablets (650 mg total) by mouth every 4 (four) hours as needed. Patient taking differently: Take 650 mg by mouth every 4 (four) hours as needed for moderate pain. 08/01/17  Yes Petrarca, Mike Craze, PA-C  ibuprofen (ADVIL) 200 MG tablet Take 400-800 mg by mouth every 8 (eight) hours as needed (pain).   Yes [provider]  losartan-hydrochlorothiazide (HYZAAR) 50-12.5 MG tablet Take 1 tablet by mouth daily.   Yes [provider]  metFORMIN (GLUCOPHAGE) 500 MG tablet Take 500 mg by mouth in the morning. 05/19/19  Yes [provider]  sildenafil (REVATIO) 20 MG tablet Take 40-100 mg by mouth daily as needed (erectile dysfunction). 08/16/20  Yes [provider]    Allergies as of 08/25/2020 - Review Complete 08/25/2020  Allergen Reaction Noted  . Flexeril [cyclobenzaprine] Shortness Of Breath 07/21/2013  . Robaxin [methocarbamol] Shortness Of Breath 07/21/2013    Family History  Problem Relation Age of Onset  . Colon cancer Neg Hx   . Colon polyps Neg Hx     Social History   Socioeconomic History  . Marital status: Widowed    Spouse name: Not on file  . Number of children: Not on file  . Years of education: Not on file  . Highest education level: Not on file  Occupational History  . Not on file  Tobacco Use  . Smoking status: Never Smoker  . Smokeless tobacco: Never Used  Vaping Use  .  Vaping Use: Never used  Substance and Sexual Activity  . Alcohol use: No  . Drug use: No  . Sexual activity: Never  Other Topics Concern  . Not on file  Social History Narrative  . Not on file   Social Determinants of Health   Financial Resource Strain: Not on file  Food Insecurity: Not on file  Transportation Needs: Not on file  Physical Activity: Not on file  Stress: Not on file  Social Connections: Not on file  Intimate Partner Violence: Not on file    Review of  Systems: See HPI, otherwise negative ROS  Physical Exam: Vital signs in last 24 hours: Temp:  [97.7 F (36.5 C)] 97.7 F (36.5 C) (04/01 0758) Pulse Rate:  [72] 72 (04/01 0758) Resp:  [16] 16 (04/01 0758) BP: (155)/(81) 155/81 (04/01 0758) SpO2:  [98 %] 98 % (04/01 0758) Weight:  [115.2 kg] 115.2 kg (04/01 0758)   General:   Alert,  Well-developed, well-nourished, pleasant and cooperative in NAD Head:  Normocephalic and atraumatic. Eyes:  Sclera clear, no icterus.   Conjunctiva pink. Ears:  Normal auditory acuity. Nose:  No deformity, discharge,  or lesions. Mouth:  No deformity or lesions, dentition normal. Neck:  Supple; no masses or thyromegaly. Lungs:  Clear throughout to auscultation.   No wheezes, crackles, or rhonchi. No acute distress. Heart:  Regular rate and rhythm; no murmurs, clicks, rubs,  or gallops. Abdomen:  Soft, nontender and nondistended. No masses, hepatosplenomegaly or hernias noted. Normal bowel sounds, without guarding, and without rebound.   Msk:  Symmetrical without gross deformities. Normal posture. Extremities:  Without clubbing or edema. Neurologic:  Alert and  oriented x4;  grossly normal neurologically. Skin:  Intact without significant lesions or rashes. Cervical Nodes:  No significant cervical adenopathy. Psych:  Alert and cooperative. Normal mood and affect.  Impression/Plan: [name] is here for a flexible sigmoidoscopy due to rectal bleeding.   The risks of the procedure including infection, bleed, or perforation as well as benefits, limitations, alternatives and imponderables have been reviewed with the patient. Questions have been answered. All parties agreeable.

## 2020-09-30 NOTE — Transfer of Care (Signed)
Immediate Anesthesia Transfer of Care Note  Patient: [name]  Procedure(s) Performed: FLEXIBLE SIGMOIDOSCOPY (N/A )  Patient Location: PACU  Anesthesia Type:General  Level of Consciousness: awake  Airway & Oxygen Therapy: Patient Spontanous Breathing  Post-op Assessment: Report given to RN and Post -op Vital signs reviewed and stable  Post vital signs: Reviewed and stable  Last Vitals:  Vitals Value Taken Time  BP    Temp    Pulse    Resp    SpO2      Last Pain:  Vitals:   09/30/20 0758  TempSrc: Oral  PainSc: 0-No pain      Patients Stated Pain Goal: 6 (40/37/54 3606)  Complications: No complications documented.

## 2020-09-30 NOTE — Op Note (Signed)
[facility] Patient Name: [name] Procedure Date: 09/30/2020 8:39 AM MRN: [id number] Date of Birth: June 14, 1956 Attending MD: Elon Alas. Abbey Chatters DO CSN: [id number] Age: 65 Admit Type: Outpatient Procedure:                Flexible Sigmoidoscopy Indications:              Hematochezia Providers:                Elon Alas. Abbey Chatters, DO, Janeece Riggers, RN, Aram Candela Referring MD:              Medicines:                See the Anesthesia note for documentation of the                            administered medications Complications:            No immediate complications. Estimated Blood Loss:     Estimated blood loss: none. Procedure:                Pre-Anesthesia Assessment:                           - The anesthesia plan was to use monitored                            anesthesia care (MAC).                           After obtaining informed consent, the scope was                            passed under direct vision. The PCF-[id number]                            ([id number]) scope was introduced through the anus and                            advanced to the the left transverse colon. The                            flexible sigmoidoscopy was accomplished without                            difficulty. The patient tolerated the procedure                            well. The quality of the bowel preparation was good. Scope In: 8:52:15 AM Scope Out: 8:56:29 AM Total Procedure Duration: 0 hours 4 minutes 14 seconds  Findings:      The perianal and digital rectal examinations were normal.      Non-bleeding internal hemorrhoids were found during endoscopy.      Scattered small-mouthed diverticula were found in the sigmoid colon and       descending colon. Impression:               - Non-bleeding internal hemorrhoids.                           -  Diverticulosis in the sigmoid colon and in the                            descending colon.                           - No specimens  collected. Moderate Sedation:      Per Anesthesia Care Recommendation:           - Discharge patient to home.                           - Resume previous diet.                           - Return to GI clinic in 2-3 months. Recommend                            hemorrhoid banding if still having issues with                            rectal bleeding. Procedure Code(s):        --- Professional ---                           [phone], Sigmoidoscopy, flexible; diagnostic,                            including collection of specimen(s) by brushing or                            washing, when performed (separate procedure) Diagnosis Code(s):        --- Professional ---                           K64.8, Other hemorrhoids                           K92.1, Melena (includes Hematochezia)                           K57.30, Diverticulosis of large intestine without                            perforation or abscess without bleeding CPT copyright 2019 American Medical Association. All rights reserved. The codes documented in this report are preliminary and upon coder review may  be revised to meet current compliance requirements. Elon Alas. Abbey Chatters, DO [place] Abbey Chatters, DO 09/30/2020 9:13:48 AM This report has been signed electronically. Number of Addenda: 0

## 2020-10-07 ENCOUNTER — Encounter (HOSPITAL_COMMUNITY): Payer: Self-pay | Admitting: Internal Medicine

## 2020-12-28 ENCOUNTER — Other Ambulatory Visit: Payer: Self-pay

## 2020-12-28 ENCOUNTER — Ambulatory Visit (INDEPENDENT_AMBULATORY_CARE_PROVIDER_SITE_OTHER): Payer: Medicare Other | Admitting: Orthopaedic Surgery

## 2020-12-28 ENCOUNTER — Encounter: Payer: Self-pay | Admitting: Orthopaedic Surgery

## 2020-12-28 DIAGNOSIS — M1712 Unilateral primary osteoarthritis, left knee: Secondary | ICD-10-CM

## 2020-12-28 MED ORDER — BUPIVACAINE HCL 0.25 % IJ SOLN
2.0000 mL | INTRAMUSCULAR | Status: AC | PRN
  Administered 2020-12-28: 2 mL via INTRA_ARTICULAR

## 2020-12-28 NOTE — Progress Notes (Signed)
Office Visit Note   Patient: [name]           Date of Birth: 11-Aug-1955           MRN: [id number] Visit Date: 12/28/2020              Requested by: Celene Squibb, MD [place],  [place] [address] PCP: Celene Squibb, MD   Assessment & Plan: Visit Diagnoses:  1. Unilateral primary osteoarthritis, left knee     Plan: Primary osteoarthritis left knee.  Will inject with cortisone.  Had good results in the past.  Had successful right total knee replacement  Follow-Up Instructions: Return if symptoms worsen or fail to improve.   Orders:  Orders Placed This Encounter  Procedures   Large Joint Inj: L knee   No orders of the defined types were placed in this encounter.     Procedures: Large Joint Inj: L knee on 12/28/2020 2:50 PM Indications: pain and diagnostic evaluation Details: 25 G 1.5 in needle, anteromedial approach  Arthrogram: No  Medications: 2 mL bupivacaine 0.25 %  12 mg betamethasone injected with Marcaine in the medial aspect of left knee Procedure, treatment alternatives, risks and benefits explained, specific risks discussed. Consent was given by the patient. Patient was prepped and draped in the usual sterile fashion.      Clinical Data: No additional findings.   Subjective: Chief Complaint  Patient presents with   Left Knee - Pain  Patient presents today for recurrent left knee pain. He is wanting to get a cortisone injection because they have helped in the past.  HPI  Review of Systems   Objective: Vital Signs: There were no vitals taken for this visit.  Physical Exam Constitutional:      Appearance: He is well-developed.  Pulmonary:     Effort: Pulmonary effort is normal.  Skin:    General: Skin is warm and dry.  Neurological:     Mental Status: He is alert and oriented to person, place, and time.  Psychiatric:        Behavior: Behavior normal.    Ortho Exam left knee was not hot red warm or swollen.  No  effusion.  Does have some medial joint tenderness with very minimal varus.  No instability.  Full extension flexed over 105 degrees.  No popliteal pain.  No calf pain.  Specialty Comments:  No specialty comments available.  Imaging: No results found.   PMFS History: Patient Active Problem List   Diagnosis Date Noted   Unilateral primary osteoarthritis, left knee 06/15/2020   Degenerative disc disease, lumbar 01/13/2020   Spondylolisthesis, lumbar region 01/13/2020   Rectal bleeding 11/06/2019   History of colonic polyps 11/06/2019   Morbid (severe) obesity due to excess calories ([place]) 10/07/2019   Chronic pain of right knee 10/08/2018   Chronic right shoulder pain 05/07/2018   History of total right knee replacement 07/30/2017   Colon cancer screening 44/08/4740   Umbilical hernia without mention of obstruction or gangrene 10/28/2013   Past Medical History:  Diagnosis Date   Arthritis    "right knee" (07/30/2017)   CAP (community acquired pneumonia) 2018   Diabetes mellitus without complication ([place])    Hypertension    Hypertension    Wears glasses     Family History  Problem Relation Age of Onset   Colon cancer Neg Hx    Colon polyps Neg Hx     Past Surgical History:  Procedure Laterality Date   COLONOSCOPY  2009   Fields: 3 small tubular adenomas removed.   COLONOSCOPY N/A 12/02/2019    three 4-5 mm polyps (hyperplastic and benign small bowel mucosa) in sigmoid colon at IC valve, redundant colon, non-bleeding internal hemorrhoids.    EYE SURGERY     FLEXIBLE SIGMOIDOSCOPY N/A 08/25/2020   Procedure: FLEXIBLE SIGMOIDOSCOPY WITH PROPOFOL;  Surgeon: [name], DO;  [facility];  Service: Endoscopy;  Laterality: N/A;  7:30am   FLEXIBLE SIGMOIDOSCOPY N/A 09/30/2020   Procedure: FLEXIBLE SIGMOIDOSCOPY;  Surgeon: [name], DO;  [facility];  Service: Endoscopy;  Laterality: N/A;  am appt   JOINT REPLACEMENT     POLYPECTOMY  12/02/2019    Procedure: POLYPECTOMY;  Surgeon: Daneil Dolin, MD;  [facility];  Service: Endoscopy;;   RETINAL DETACHMENT SURGERY Left 2000s   TOTAL KNEE ARTHROPLASTY Right 07/30/2017   TOTAL KNEE ARTHROPLASTY Right 07/30/2017   Procedure: RIGHT TOTAL KNEE ARTHROPLASTY;  Surgeon: [name], MD;  Location: [facility];  Service: Orthopedics;  Laterality: Right;   VASECTOMY     Social History   Occupational History   Not on file  Tobacco Use   Smoking status: Never   Smokeless tobacco: Never  Vaping Use   Vaping Use: Never used  Substance and Sexual Activity   Alcohol use: No   Drug use: No   Sexual activity: Never

## 2021-01-09 NOTE — Progress Notes (Deleted)
history of adenomas, rectal bleeding, recent colonoscopy (June 2021) with three 4-5 mm polyps (hyperplastic and benign small bowel mucosa) in sigmoid colon at IC valve, redundant colon, non-bleeding internal hemorrhoids. Requested early interval evaluation and underwent flex sig with non-bleeding internal hemorrhoids and sigmoid/descending colon diverticulosis.

## 2021-01-10 ENCOUNTER — Ambulatory Visit: Payer: Medicare Other | Admitting: Gastroenterology

## 2021-01-10 ENCOUNTER — Encounter: Payer: Self-pay | Admitting: Internal Medicine

## 2021-03-08 ENCOUNTER — Other Ambulatory Visit: Payer: Self-pay

## 2021-03-08 ENCOUNTER — Encounter: Payer: Self-pay | Admitting: Orthopaedic Surgery

## 2021-03-08 ENCOUNTER — Ambulatory Visit (INDEPENDENT_AMBULATORY_CARE_PROVIDER_SITE_OTHER): Payer: Medicare Other | Admitting: Orthopaedic Surgery

## 2021-03-08 DIAGNOSIS — M1712 Unilateral primary osteoarthritis, left knee: Secondary | ICD-10-CM

## 2021-03-08 MED ORDER — BUPIVACAINE HCL 0.5 % IJ SOLN
2.0000 mL | INTRAMUSCULAR | Status: AC | PRN
  Administered 2021-03-08: 2 mL via INTRA_ARTICULAR

## 2021-03-08 NOTE — Progress Notes (Signed)
Office Visit Note   Patient: [name]           Date of Birth: [birth year]           MRN: [id number] Visit Date: 03/08/2021              Requested by: Celene Squibb, MD [place],  [place] [address] PCP: Celene Squibb, MD   Assessment & Plan: Visit Diagnoses:  1. Unilateral primary osteoarthritis, left knee     Plan: Recurrent symptoms of osteoarthritis left knee.  Will reinject with cortisone.  Has done very well in the past with similar injection  Follow-Up Instructions: Return if symptoms worsen or fail to improve.   Orders:  No orders of the defined types were placed in this encounter.  No orders of the defined types were placed in this encounter.     Procedures: Large Joint Inj: L knee on 03/08/2021 2:57 PM Indications: pain and diagnostic evaluation Details: 25 G 1.5 in needle, anteromedial approach  Arthrogram: No  Medications: 2 mL bupivacaine 0.5 %  12 mg betamethasone injected in the medial compartment left knee with Marcaine Procedure, treatment alternatives, risks and benefits explained, specific risks discussed. Consent was given by the patient. Patient was prepped and draped in the usual sterile fashion.      Clinical Data: No additional findings.   Subjective: Chief Complaint  Patient presents with   Left Knee - Pain  Patient presents today for follow up on his left knee. He was here two months ago and received a left knee cortisone injection. Patient states that his pain returned one week ago. He is taking Tylenol as needed.   HPI  Review of Systems   Objective: Vital Signs: There were no vitals taken for this visit.  Physical Exam Constitutional:      Appearance: He is well-developed.  Pulmonary:     Effort: Pulmonary effort is normal.  Skin:    General: Skin is warm and dry.  Neurological:     Mental Status: He is alert and oriented to person, place, and time.  Psychiatric:        Behavior: Behavior normal.     Ortho Exam left knee was not hot warm or red.  No effusion.  Diffuse mild to moderate medial joint pain.  No patella crepitation.  No instability.  Full extension flexed about 105 degrees.  No lateral joint pain  Specialty Comments:  No specialty comments available.  Imaging: No results found.   PMFS History: Patient Active Problem List   Diagnosis Date Noted   Unilateral primary osteoarthritis, left knee 06/15/2020   Degenerative disc disease, lumbar 01/13/2020   Spondylolisthesis, lumbar region 01/13/2020   Rectal bleeding 11/06/2019   History of colonic polyps 11/06/2019   Morbid (severe) obesity due to excess calories ([place]) 10/07/2019   Chronic pain of right knee 10/08/2018   Chronic right shoulder pain 05/07/2018   History of total right knee replacement 07/30/2017   Colon cancer screening [id number]   Umbilical hernia without mention of obstruction or gangrene 10/28/2013   Past Medical History:  Diagnosis Date   Arthritis    "right knee" (07/30/2017)   CAP (community acquired pneumonia) 2018   Diabetes mellitus without complication ([place])    Hypertension    Hypertension    Wears glasses     Family History  Problem Relation Age of Onset   Colon cancer Neg Hx    Colon polyps Neg Hx  Past Surgical History:  Procedure Laterality Date   COLONOSCOPY  2009   Fields: 3 small tubular adenomas removed.   COLONOSCOPY N/A 12/02/2019    three 4-5 mm polyps (hyperplastic and benign small bowel mucosa) in sigmoid colon at IC valve, redundant colon, non-bleeding internal hemorrhoids.    EYE SURGERY     FLEXIBLE SIGMOIDOSCOPY N/A 08/25/2020   Procedure: FLEXIBLE SIGMOIDOSCOPY WITH PROPOFOL;  Surgeon: [name], DO;  [facility];  Service: Endoscopy;  Laterality: N/A;  7:30am   FLEXIBLE SIGMOIDOSCOPY N/A 09/30/2020   Procedure: FLEXIBLE SIGMOIDOSCOPY;  Surgeon: [name], DO;  [facility];  Service: Endoscopy;  Laterality: N/A;  am appt    JOINT REPLACEMENT     POLYPECTOMY  12/02/2019   Procedure: POLYPECTOMY;  Surgeon: Daneil Dolin, MD;  [facility];  Service: Endoscopy;;   RETINAL DETACHMENT SURGERY Left 2000s   TOTAL KNEE ARTHROPLASTY Right 07/30/2017   TOTAL KNEE ARTHROPLASTY Right 07/30/2017   Procedure: RIGHT TOTAL KNEE ARTHROPLASTY;  Surgeon: [name], MD;  Location: [facility];  Service: Orthopedics;  Laterality: Right;   VASECTOMY     Social History   Occupational History   Not on file  Tobacco Use   Smoking status: Never   Smokeless tobacco: Never  Vaping Use   Vaping Use: Never used  Substance and Sexual Activity   Alcohol use: No   Drug use: No   Sexual activity: Never

## 2021-03-14 DIAGNOSIS — E782 Mixed hyperlipidemia: Secondary | ICD-10-CM | POA: Diagnosis not present

## 2021-03-14 DIAGNOSIS — E119 Type 2 diabetes mellitus without complications: Secondary | ICD-10-CM | POA: Diagnosis not present

## 2021-03-21 DIAGNOSIS — Z86010 Personal history of colonic polyps: Secondary | ICD-10-CM | POA: Diagnosis not present

## 2021-03-21 DIAGNOSIS — M25519 Pain in unspecified shoulder: Secondary | ICD-10-CM | POA: Diagnosis not present

## 2021-03-21 DIAGNOSIS — Z96651 Presence of right artificial knee joint: Secondary | ICD-10-CM | POA: Diagnosis not present

## 2021-03-21 DIAGNOSIS — E782 Mixed hyperlipidemia: Secondary | ICD-10-CM | POA: Diagnosis not present

## 2021-03-21 DIAGNOSIS — R945 Abnormal results of liver function studies: Secondary | ICD-10-CM | POA: Diagnosis not present

## 2021-03-21 DIAGNOSIS — R7303 Prediabetes: Secondary | ICD-10-CM | POA: Diagnosis not present

## 2021-03-21 DIAGNOSIS — K429 Umbilical hernia without obstruction or gangrene: Secondary | ICD-10-CM | POA: Diagnosis not present

## 2021-03-21 DIAGNOSIS — D72829 Elevated white blood cell count, unspecified: Secondary | ICD-10-CM | POA: Diagnosis not present

## 2021-03-21 DIAGNOSIS — H338 Other retinal detachments: Secondary | ICD-10-CM | POA: Diagnosis not present

## 2021-03-21 DIAGNOSIS — E1165 Type 2 diabetes mellitus with hyperglycemia: Secondary | ICD-10-CM | POA: Diagnosis not present

## 2021-04-13 DIAGNOSIS — H2513 Age-related nuclear cataract, bilateral: Secondary | ICD-10-CM | POA: Diagnosis not present

## 2021-04-13 DIAGNOSIS — H5213 Myopia, bilateral: Secondary | ICD-10-CM | POA: Diagnosis not present

## 2021-04-13 DIAGNOSIS — E119 Type 2 diabetes mellitus without complications: Secondary | ICD-10-CM | POA: Diagnosis not present

## 2021-04-13 DIAGNOSIS — H52223 Regular astigmatism, bilateral: Secondary | ICD-10-CM | POA: Diagnosis not present

## 2021-04-13 DIAGNOSIS — H524 Presbyopia: Secondary | ICD-10-CM | POA: Diagnosis not present

## 2021-04-13 DIAGNOSIS — H338 Other retinal detachments: Secondary | ICD-10-CM | POA: Diagnosis not present

## 2021-04-13 DIAGNOSIS — Z7984 Long term (current) use of oral hypoglycemic drugs: Secondary | ICD-10-CM | POA: Diagnosis not present

## 2021-04-20 ENCOUNTER — Telehealth: Payer: Self-pay

## 2021-04-20 NOTE — Telephone Encounter (Signed)
Had a cortisone injection left knee Sept 7th-if he is requesting similar injection then too early. Should wait at least 3 months

## 2021-04-20 NOTE — Telephone Encounter (Signed)
Called patient. No answer. Left message with information below.

## 2021-04-20 NOTE — Telephone Encounter (Signed)
Pt called and would like to know if he can get a Cortizone shot today ?

## 2021-04-24 ENCOUNTER — Other Ambulatory Visit: Payer: Self-pay

## 2021-04-24 ENCOUNTER — Other Ambulatory Visit (HOSPITAL_COMMUNITY): Payer: Self-pay | Admitting: Family Medicine

## 2021-04-24 ENCOUNTER — Ambulatory Visit (HOSPITAL_COMMUNITY)
Admission: RE | Admit: 2021-04-24 | Discharge: 2021-04-24 | Disposition: A | Discharge status: Home / Self Care | Payer: Medicare Other | Source: Ambulatory Visit | Attending: Family Medicine | Admitting: Family Medicine

## 2021-04-24 DIAGNOSIS — M25552 Pain in left hip: Secondary | ICD-10-CM

## 2021-04-24 IMAGING — DX DG HIP (WITH OR WITHOUT PELVIS) 2-3V*L*
3 series · 3 of 3 positions shown · non-contrast
Comparison: None.

CLINICAL DATA: Left hip pain

EXAM:
DG HIP (WITH OR WITHOUT PELVIS) 2-3V LEFT

[pelvis ap]
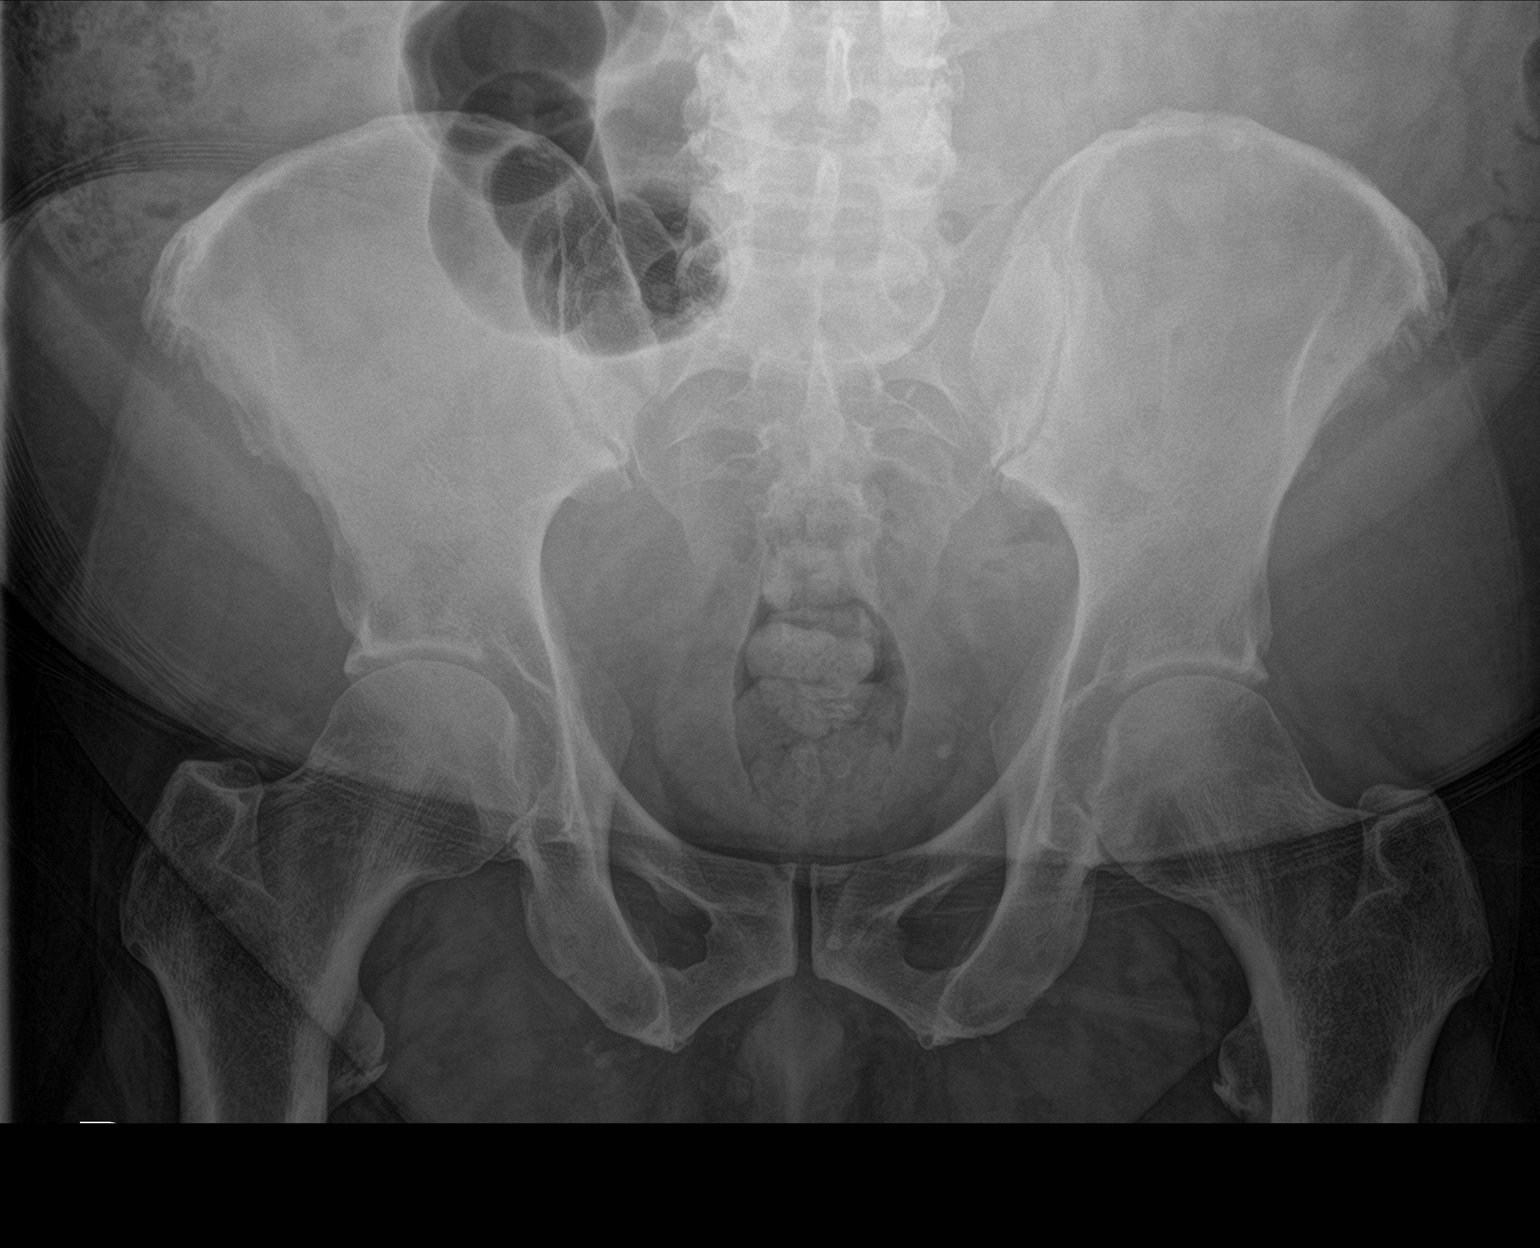

[hip ap]
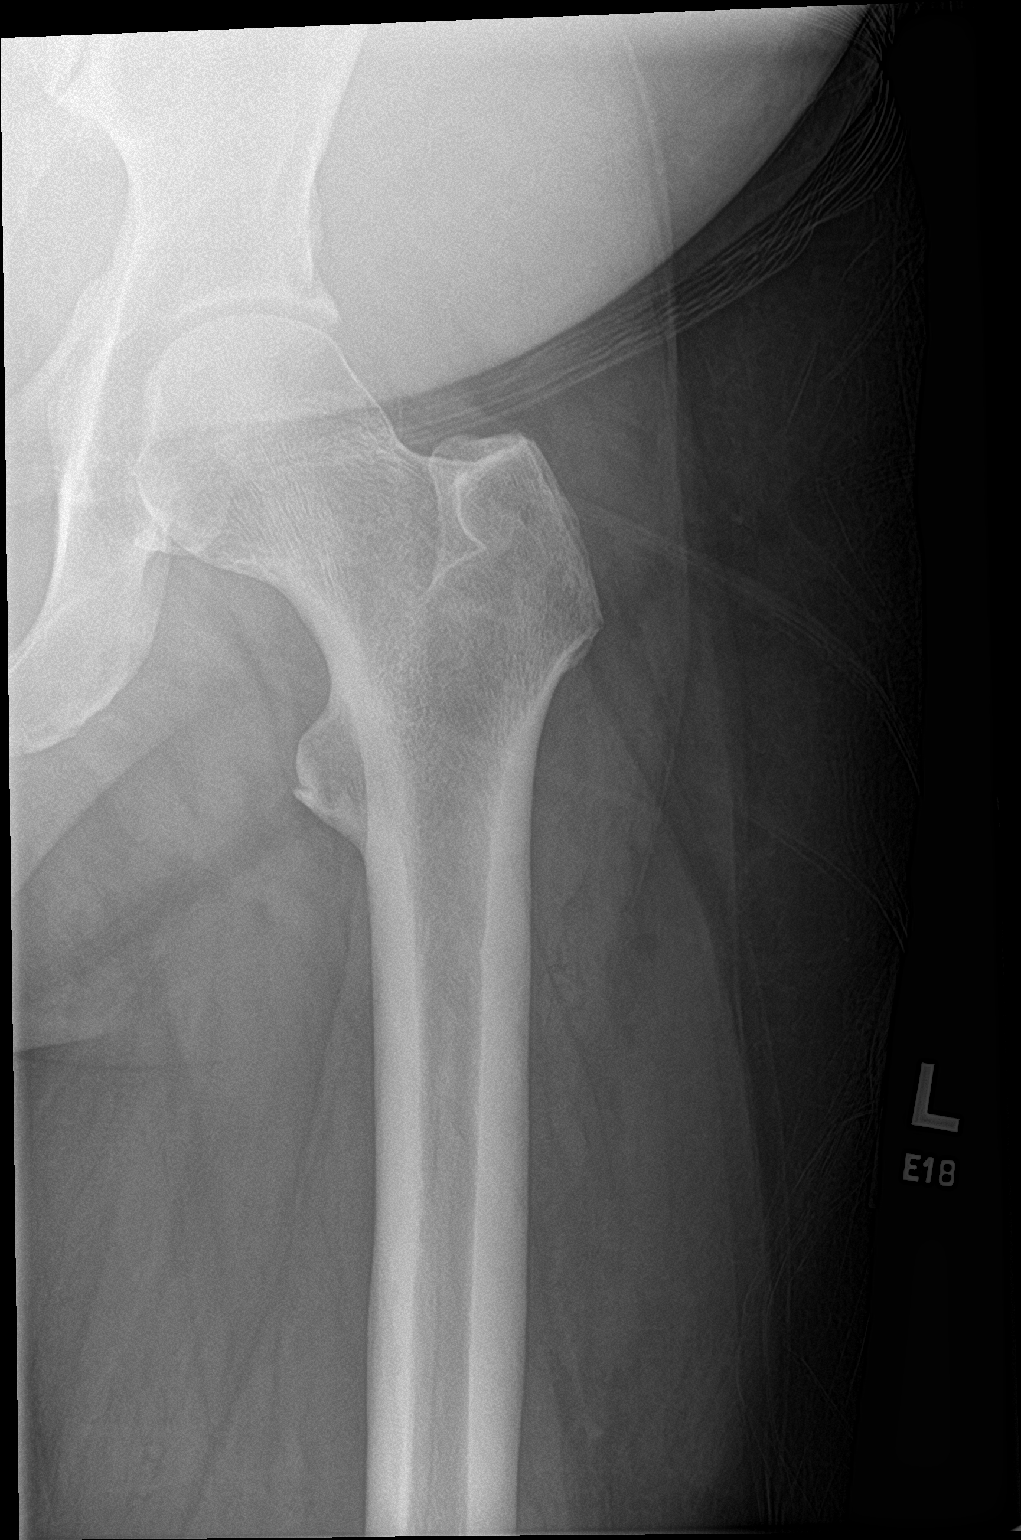

[hip frog leg]
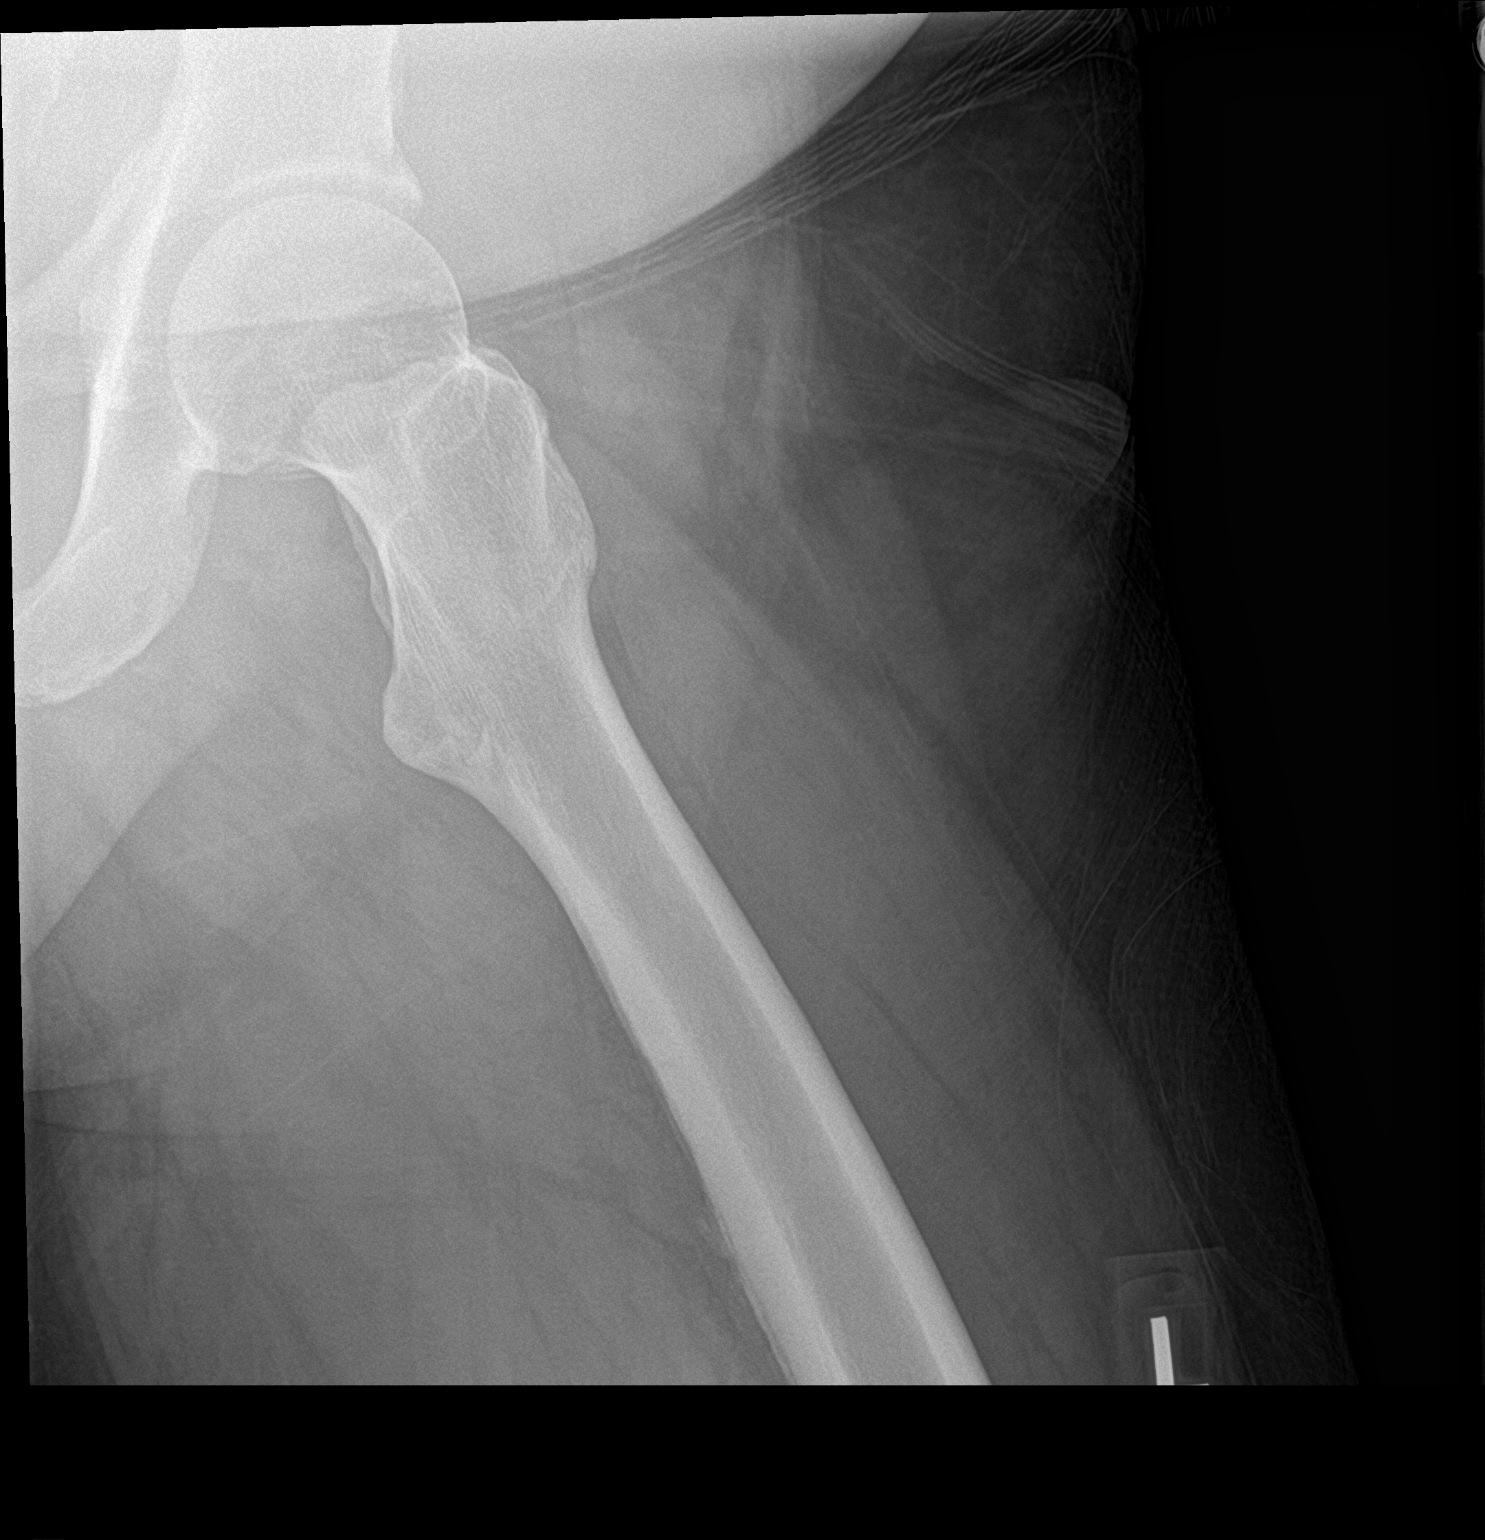

[3 of 3 positions shown; findings below may reference images not displayed]

FINDINGS: Normal alignment. No acute fracture or dislocation. Joint spaces are
preserved. Soft tissues are unremarkable.
IMPRESSION: Negative.

## 2021-04-26 ENCOUNTER — Encounter: Payer: Self-pay | Admitting: Gastroenterology

## 2021-04-26 ENCOUNTER — Ambulatory Visit (INDEPENDENT_AMBULATORY_CARE_PROVIDER_SITE_OTHER): Payer: Medicare Other | Admitting: Gastroenterology

## 2021-04-26 ENCOUNTER — Other Ambulatory Visit: Payer: Self-pay

## 2021-04-26 VITALS — BP 126/71 | HR 70 | Temp 97.5°F | Ht 66.0 in | Wt 255.2 lb

## 2021-04-26 DIAGNOSIS — K625 Hemorrhage of anus and rectum: Secondary | ICD-10-CM | POA: Diagnosis not present

## 2021-04-26 NOTE — Progress Notes (Signed)
Referring Provider: Celene Squibb, MD Primary Care Physician:  Celene Squibb, MD Primary GI: Dr. Abbey Chatters  Chief Complaint  Patient presents with   Rectal Bleeding    F/u, no bleeding now    HPI:   [name] is a 65 y.o. male presenting today with a history of adenomas, rectal bleeding, recent colonoscopy June 2021 with three 4-5 mm polyps (hyperplastic and benign small bowel mucosa) in sigmoid colon at IC valve, redundant colon, non-bleeding internal hemorrhoids. He noted low-volume hematochezia in Jan 2022 and was concerned. Due to his concern, we did an early interval flex sig with non-bleeding internal hemorrhoids and sigmoid/descending colon diverticulosis.  Returns today without any concerns. No further rectal bleeding. No abdominal pain, N/V. Denies any soiling, fecal seepage, tissue prolapsing. No straining. No constipation or diarrhea. No GERD. No dysphagia.   Past Medical History:  Diagnosis Date   Arthritis    "right knee" (07/30/2017)   CAP (community acquired pneumonia) 2018   Diabetes mellitus without complication ([place])    Hypertension    Hypertension    Wears glasses     Past Surgical History:  Procedure Laterality Date   COLONOSCOPY  2009   Fields: 3 small tubular adenomas removed.   COLONOSCOPY N/A 12/02/2019    three 4-5 mm polyps (hyperplastic and benign small bowel mucosa) in sigmoid colon at IC valve, redundant colon, non-bleeding internal hemorrhoids.    EYE SURGERY     FLEXIBLE SIGMOIDOSCOPY N/A 08/25/2020   Procedure: FLEXIBLE SIGMOIDOSCOPY WITH PROPOFOL;  Surgeon: [name], DO;  [facility];  Service: Endoscopy;  Laterality: N/A;  7:30am   FLEXIBLE SIGMOIDOSCOPY N/A 09/30/2020   Procedure: FLEXIBLE SIGMOIDOSCOPY;  Surgeon: [name], DO;  [facility];  Service: Endoscopy;  Laterality: N/A;  am appt   JOINT REPLACEMENT     POLYPECTOMY  12/02/2019   Procedure: POLYPECTOMY;  Surgeon: Daneil Dolin, MD;  [facility];  Service: Endoscopy;;   RETINAL DETACHMENT SURGERY Left 2000s   TOTAL KNEE ARTHROPLASTY Right 07/30/2017   TOTAL KNEE ARTHROPLASTY Right 07/30/2017   Procedure: RIGHT TOTAL KNEE ARTHROPLASTY;  Surgeon: [name], MD;  Location: [facility];  Service: Orthopedics;  Laterality: Right;   VASECTOMY      Current Outpatient Medications  Medication Sig Dispense Refill   acetaminophen (TYLENOL) 325 MG tablet Take 2 tablets (650 mg total) by mouth every 4 (four) hours as needed. (Patient taking differently: Take 650 mg by mouth every 4 (four) hours as needed for moderate pain.)     ibuprofen (ADVIL) 200 MG tablet Take 400-800 mg by mouth every 8 (eight) hours as needed (pain).     losartan-hydrochlorothiazide (HYZAAR) 50-12.5 MG tablet Take 1 tablet by mouth daily.     metFORMIN (GLUCOPHAGE) 500 MG tablet Take 500 mg by mouth in the morning.     sildenafil (REVATIO) 20 MG tablet Take 40-100 mg by mouth daily as needed (erectile dysfunction).     No current facility-administered medications for this visit.    Allergies as of 04/26/2021 - Review Complete 04/26/2021  Allergen Reaction Noted   Flexeril [cyclobenzaprine] Shortness Of Breath 07/21/2013   Robaxin [methocarbamol] Shortness Of Breath 07/21/2013    Family History  Problem Relation Age of Onset   Colon cancer Neg Hx    Colon polyps Neg Hx     Social History   Socioeconomic History   Marital status: Widowed    Spouse name:  Not on file   Number of children: Not on file   Years of education: Not on file   Highest education level: Not on file  Occupational History   Not on file  Tobacco Use   Smoking status: Never   Smokeless tobacco: Never  Vaping Use   Vaping Use: Never used  Substance and Sexual Activity   Alcohol use: No   Drug use: No   Sexual activity: Never  Other Topics Concern   Not on file  Social History Narrative   Not on file   Social Determinants of Health   Financial Resource Strain:  Not on file  Food Insecurity: Not on file  Transportation Needs: Not on file  Physical Activity: Not on file  Stress: Not on file  Social Connections: Not on file    Review of Systems: Gen: Denies fever, chills, anorexia. Denies fatigue, weakness, weight loss.  CV: Denies chest pain, palpitations, syncope, peripheral edema, and claudication. Resp: Denies dyspnea at rest, cough, wheezing, coughing up blood, and pleurisy. GI: see HPI Derm: Denies rash, itching, dry skin Psych: Denies depression, anxiety, memory loss, confusion. No homicidal or suicidal ideation.  Heme: Denies bruising, bleeding, and enlarged lymph nodes.  Physical Exam: BP 126/71   Pulse 70   Temp (!) 97.5 F (36.4 C) (Temporal)   Ht 5\' 6"  (1.676 m)   Wt 255 lb 3.2 oz (115.8 kg)   BMI 41.19 kg/m  General:   Alert and oriented. No distress noted. Pleasant and cooperative.  Head:  Normocephalic and atraumatic. Eyes:  Conjuctiva clear without scleral icterus. Mouth:  mask in place Abdomen:  +BS, soft, non-tender and non-distended. No rebound or guarding. No HSM or masses noted. Msk:  Symmetrical without gross deformities. Normal posture. Extremities:  Without edema. Neurologic:  Alert and  oriented x4 Psych:  Alert and cooperative. Normal mood and affect.  ASSESSMENT/PLAN: [name] is a 65 y.o. male presenting today with history of rectal bleeding due to internal hemorrhoids. He has had complete resolution of his symptoms.  He is declining banding at this time. He would be a good candidate in the future if he has recurrent symptoms.  We will see him back on an as needed basis. Next colonoscopy in 10 years.   Annitta Needs, PhD, ANP-BC [facility]astro[facility]

## 2021-04-26 NOTE — Patient Instructions (Signed)
I am glad you are doing well!  We will see you back as needed.  Please call if any further rectal bleeding!  I enjoyed seeing you again today! As you know, I value our relationship and want to provide genuine, compassionate, and quality care. I welcome your feedback. If you receive a survey regarding your visit,  I greatly appreciate you taking time to fill this out. See you next time!  Annitta Needs, PhD, ANP-BC [facility]astro[facility]

## 2021-05-31 ENCOUNTER — Ambulatory Visit (INDEPENDENT_AMBULATORY_CARE_PROVIDER_SITE_OTHER): Payer: Medicare Other | Admitting: Orthopaedic Surgery

## 2021-05-31 ENCOUNTER — Encounter: Payer: Self-pay | Admitting: Orthopaedic Surgery

## 2021-05-31 ENCOUNTER — Other Ambulatory Visit: Payer: Self-pay

## 2021-05-31 VITALS — Ht 66.0 in | Wt 255.0 lb

## 2021-05-31 DIAGNOSIS — M1712 Unilateral primary osteoarthritis, left knee: Secondary | ICD-10-CM

## 2021-05-31 NOTE — Progress Notes (Signed)
Office Visit Note   Patient: [name]           Date of Birth: 06/19/1956           MRN: [id number] Visit Date: 05/31/2021              Requested by: Celene Squibb, MD [place],  [place] [address] PCP: Celene Squibb, MD   Assessment & Plan: Visit Diagnoses:  1. Unilateral primary osteoarthritis, left knee     Plan: Mr. Berrett has osteoarthritis of his left knee by virtue of prior films.  Most of the arthritis is localized along the medial compartment where there was some narrowing of the joint and subchondral sclerosis.  He has had cortisone in the past.  He has had some recurrent pain along the medial compartment and wanted to know its cause.  I had a long discussion regarding his knee and the fact that he has arthritis.  I do not think it is worth injecting his knee today as he was not that symptomatic and I answered all of his questions.  He might want to pick up a knee sleeve at one of the local pharmacies particularly in the winter to help trap the heat and could certainly try over-the-counter medicines.  Its only been several months since he had an injection I like him to wait at least till after the first of the year.  I do not think at this point he is a candidate for knee replacement as of the arthritis is that advanced and is not having that much compromise of his activities  Follow-Up Instructions: Return if symptoms worsen or fail to improve.   Orders:  No orders of the defined types were placed in this encounter.  No orders of the defined types were placed in this encounter.     Procedures: No procedures performed   Clinical Data: No additional findings.   Subjective: Chief Complaint  Patient presents with   Left Knee - Pain  Patient presents with medial left knee pain. He had a left knee injection on 03/08/2021 with relief, but states that there is one place that he can push that continues to bother him. He denies swelling. He is not taking  anything for the knee pain.  HPI  Review of Systems   Objective: Vital Signs: Ht 5\' 6"  (1.676 m)   Wt 255 lb (115.7 kg)   BMI 41.16 kg/m   Physical Exam Constitutional:      Appearance: He is well-developed.  Pulmonary:     Effort: Pulmonary effort is normal.  Skin:    General: Skin is warm and dry.  Neurological:     Mental Status: He is alert and oriented to person, place, and time.  Psychiatric:        Behavior: Behavior normal.    Ortho Exam knee was not hot red warm or swollen.  Full quick extension and flexed over 100 degrees without instability.  He did have some moderate pain along the entire medial compartment.  No effusion.  No lateral joint pain.  Very minimal patella crepitation.  No popliteal pain or mass.  No calf pain.  Painless range of motion of right hip  Specialty Comments:  No specialty comments available.  Imaging: No results found.   PMFS History: Patient Active Problem List   Diagnosis Date Noted   Unilateral primary osteoarthritis, left knee 06/15/2020   Degenerative disc disease, lumbar 01/13/2020   Spondylolisthesis,  lumbar region 01/13/2020   Rectal bleeding 11/06/2019   History of colonic polyps 11/06/2019   Morbid (severe) obesity due to excess calories ([place]) 10/07/2019   Chronic pain of right knee 10/08/2018   Chronic right shoulder pain 05/07/2018   History of total right knee replacement 07/30/2017   Colon cancer screening 65/68/1275   Umbilical hernia without mention of obstruction or gangrene 10/28/2013   Past Medical History:  Diagnosis Date   Arthritis    "right knee" (07/30/2017)   CAP (community acquired pneumonia) 2018   Diabetes mellitus without complication ([place])    Hypertension    Hypertension    Wears glasses     Family History  Problem Relation Age of Onset   Colon cancer Neg Hx    Colon polyps Neg Hx     Past Surgical History:  Procedure Laterality Date   COLONOSCOPY  2009   Fields: 3 small tubular  adenomas removed.   COLONOSCOPY N/A 12/02/2019    three 4-5 mm polyps (hyperplastic and benign small bowel mucosa) in sigmoid colon at IC valve, redundant colon, non-bleeding internal hemorrhoids.    EYE SURGERY     FLEXIBLE SIGMOIDOSCOPY N/A 08/25/2020   Procedure: FLEXIBLE SIGMOIDOSCOPY WITH PROPOFOL;  Surgeon: [name], DO;  [facility];  Service: Endoscopy;  Laterality: N/A;  7:30am   FLEXIBLE SIGMOIDOSCOPY N/A 09/30/2020   Procedure: FLEXIBLE SIGMOIDOSCOPY;  Surgeon: [name], DO;  [facility];  Service: Endoscopy;  Laterality: N/A;  am appt   JOINT REPLACEMENT     POLYPECTOMY  12/02/2019   Procedure: POLYPECTOMY;  Surgeon: Daneil Dolin, MD;  [facility];  Service: Endoscopy;;   RETINAL DETACHMENT SURGERY Left 2000s   TOTAL KNEE ARTHROPLASTY Right 07/30/2017   TOTAL KNEE ARTHROPLASTY Right 07/30/2017   Procedure: RIGHT TOTAL KNEE ARTHROPLASTY;  Surgeon: [name], MD;  Location: [facility];  Service: Orthopedics;  Laterality: Right;   VASECTOMY     Social History   Occupational History   Not on file  Tobacco Use   Smoking status: Never   Smokeless tobacco: Never  Vaping Use   Vaping Use: Never used  Substance and Sexual Activity   Alcohol use: No   Drug use: No   Sexual activity: Never

## 2021-06-06 ENCOUNTER — Other Ambulatory Visit (HOSPITAL_COMMUNITY): Payer: Self-pay | Admitting: Nurse Practitioner

## 2021-06-06 ENCOUNTER — Ambulatory Visit (HOSPITAL_COMMUNITY)
Admission: RE | Admit: 2021-06-06 | Discharge: 2021-06-06 | Disposition: A | Discharge status: Home / Self Care | Payer: Medicare Other | Source: Ambulatory Visit | Attending: Nurse Practitioner | Admitting: Nurse Practitioner

## 2021-06-06 ENCOUNTER — Other Ambulatory Visit: Payer: Self-pay

## 2021-06-06 DIAGNOSIS — M25562 Pain in left knee: Secondary | ICD-10-CM

## 2021-06-06 IMAGING — CR DG KNEE 1-2V*L*
2 series · 2 of 2 positions shown · non-contrast
Comparison: No recent prior available.

CLINICAL DATA: Left knee pain.  Knot on inside of knee.

EXAM:
LEFT KNEE - 1-2 VIEW

[t  ap left]
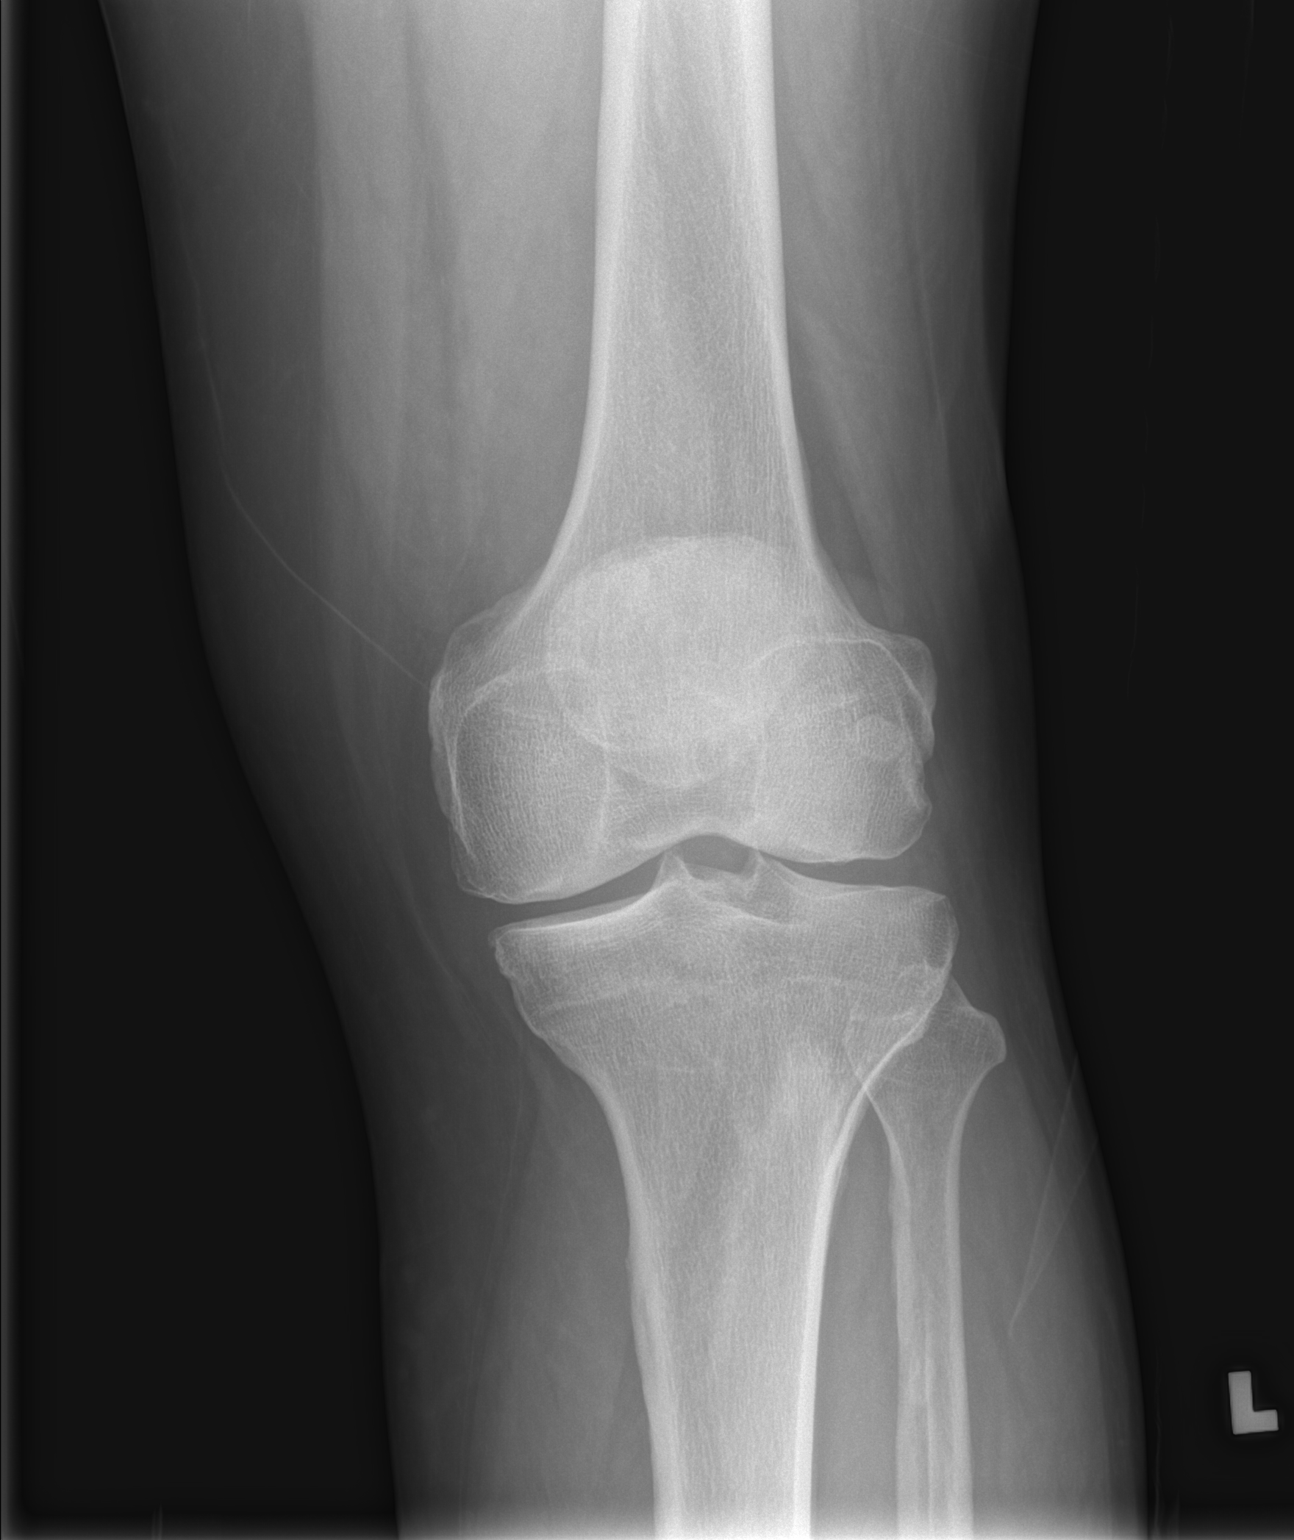

[t  lateral left]
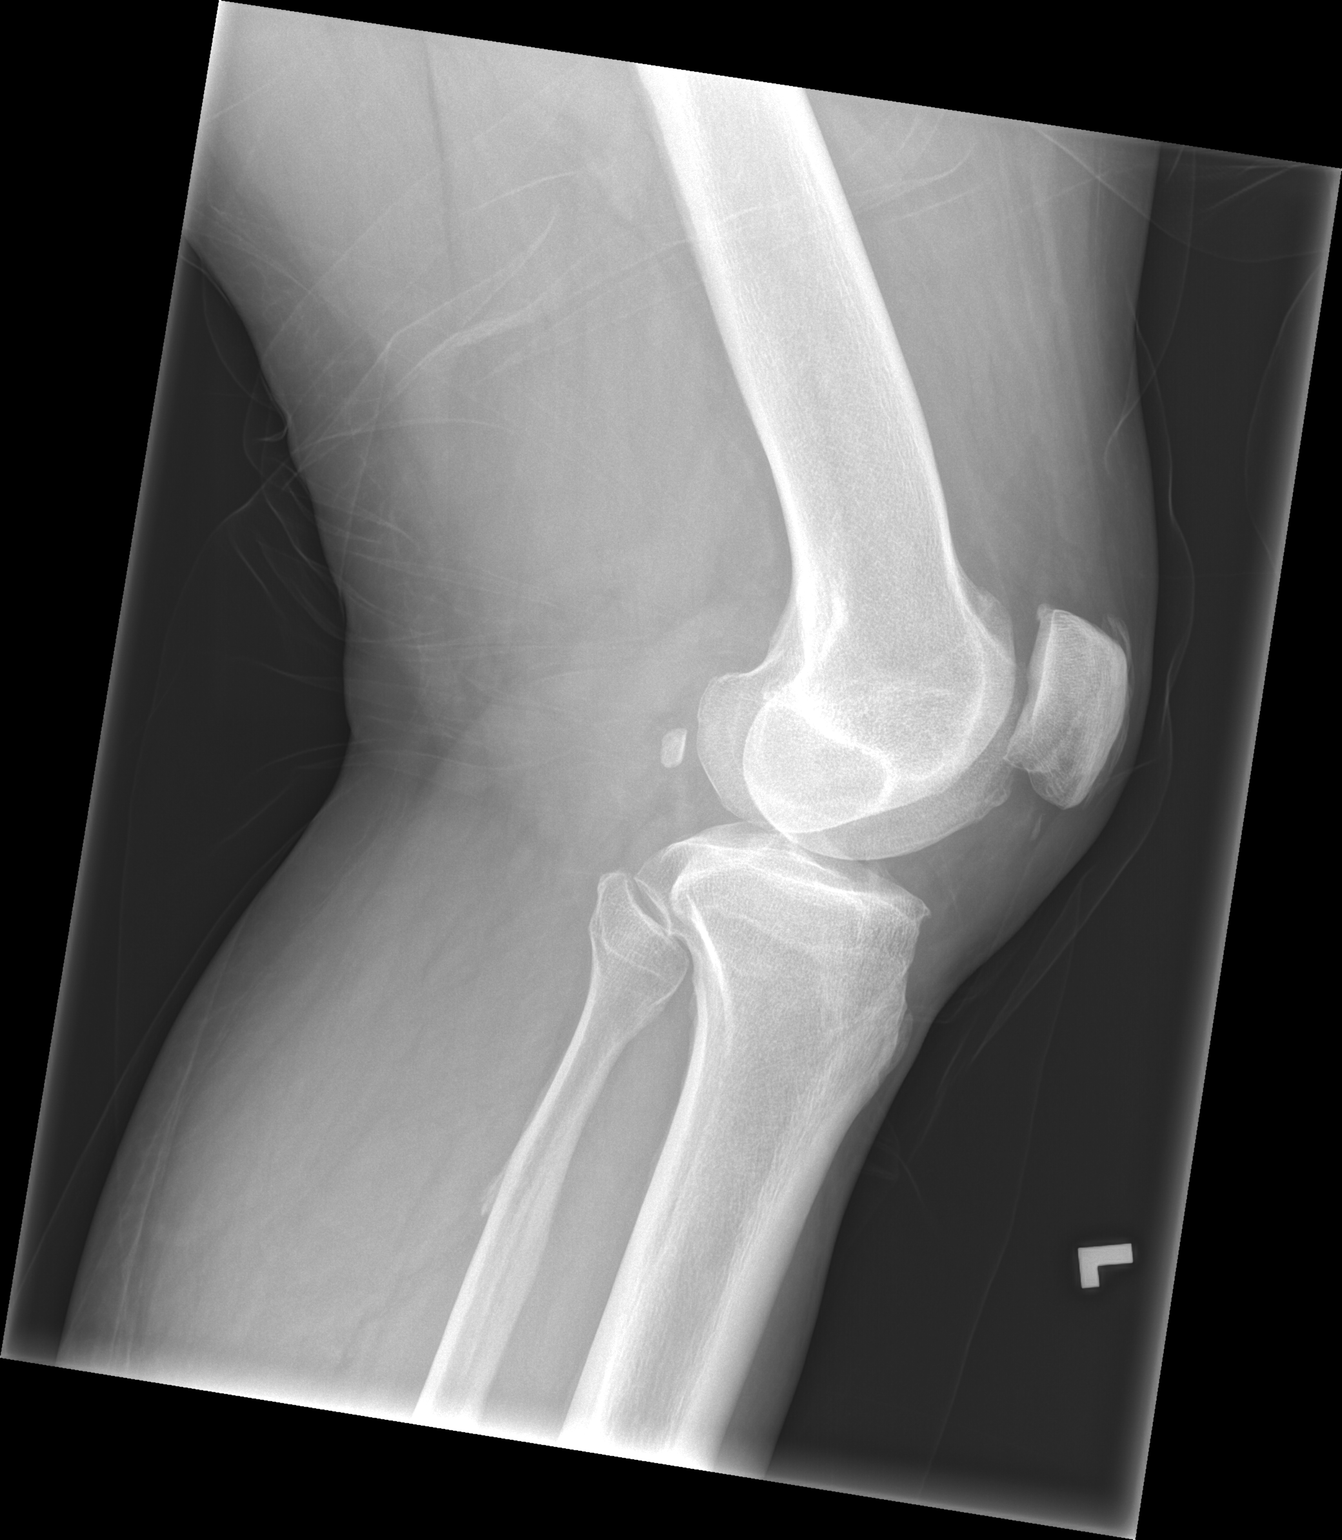

[2 of 2 positions shown; findings below may reference images not displayed]

FINDINGS: Tricompartment degenerative change. Linear corticated densities
noted along the posterior aspect of the proximal femur, this may be
from prior injury. No acute or focal bony abnormality identified. If
symptoms persist MRI can be obtained. No evidence of effusion.
IMPRESSION: Tricompartment degenerative change. No acute abnormality identified.
If symptoms persist MRI of the left knee can be obtained.

## 2021-06-07 ENCOUNTER — Other Ambulatory Visit: Payer: Self-pay | Admitting: Nurse Practitioner

## 2021-06-07 ENCOUNTER — Other Ambulatory Visit (HOSPITAL_COMMUNITY): Payer: Self-pay | Admitting: Nurse Practitioner

## 2021-06-07 DIAGNOSIS — M25562 Pain in left knee: Secondary | ICD-10-CM

## 2021-06-13 ENCOUNTER — Other Ambulatory Visit: Payer: Self-pay

## 2021-06-13 ENCOUNTER — Ambulatory Visit (HOSPITAL_COMMUNITY)
Admission: RE | Admit: 2021-06-13 | Discharge: 2021-06-13 | Disposition: A | Discharge status: Home / Self Care | Payer: Medicare Other | Source: Ambulatory Visit | Attending: Nurse Practitioner | Admitting: Nurse Practitioner

## 2021-06-13 DIAGNOSIS — M25562 Pain in left knee: Secondary | ICD-10-CM | POA: Diagnosis not present

## 2021-06-13 IMAGING — MR MR KNEE*L* W/O CM
7 series · 40 of 40 positions shown · non-contrast
Comparison: X-ray knee [DATE].

CLINICAL DATA: Left knee pain for 1 month.

EXAM:
MRI OF THE LEFT KNEE WITHOUT CONTRAST
TECHNIQUE: Multiplanar, multisequence MR imaging of the knee was performed. No
intravenous contrast was administered.

[Series 8: T2 fat-sat · axial · left · 4.0mm · 0.47mm/px · z∈[-99,+24]mm · 5 of 26 slices shown (1 of 3)]
[im 1/26]
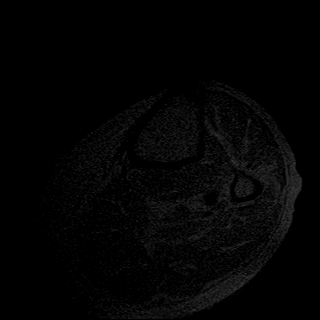
[im 7/26]
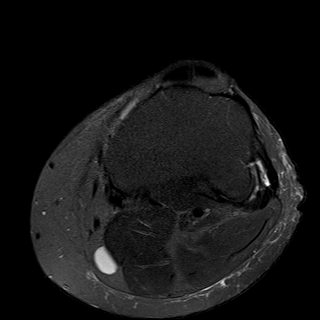
[im 13/26]
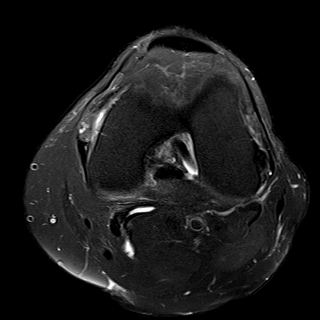
[im 19/26]
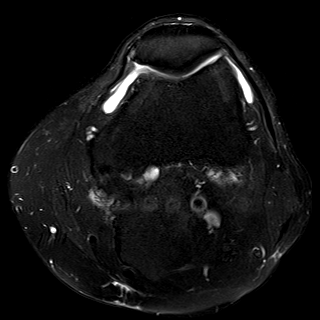
[im 26/26]
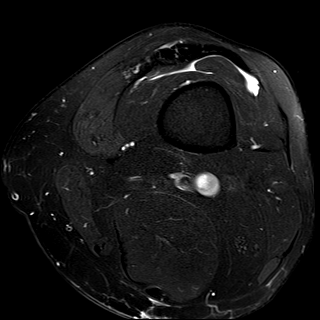

[Series 9: T1 · coronal · left · 4.0mm · 0.59mm/px · 6 of 27 slices shown]
[im 1/27]
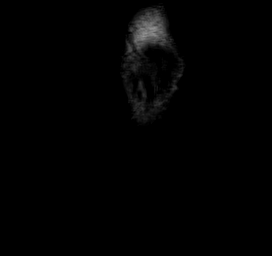
[im 6/27]
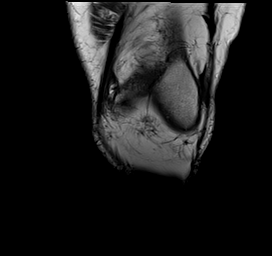
[im 11/27]
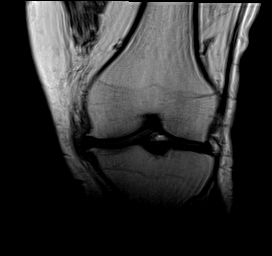
[im 16/27]
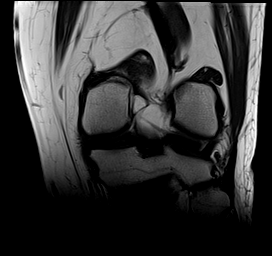
[im 21/27]
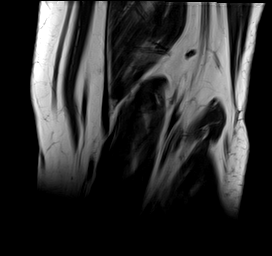
[im 27/27]
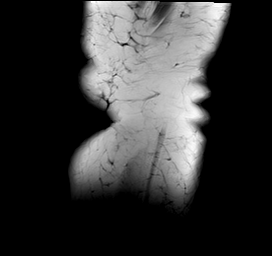

[Series 10: T2 fat-sat · coronal · left · 4.0mm · 0.59mm/px · 6 of 28 slices shown (2 of 3)]
[im 1/28]
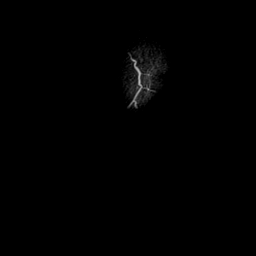
[im 6/28]
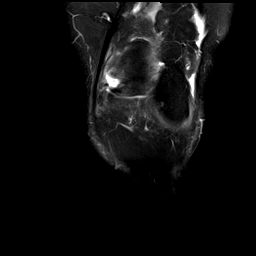
[im 11/28]
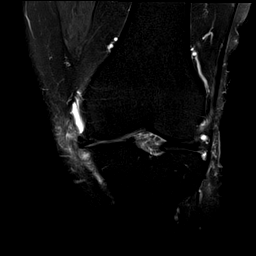
[im 17/28]
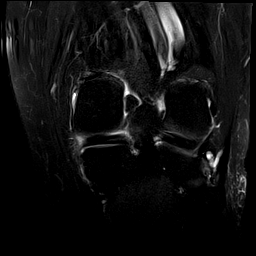
[im 22/28]
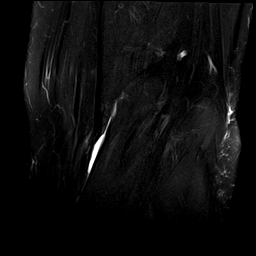
[im 28/28]
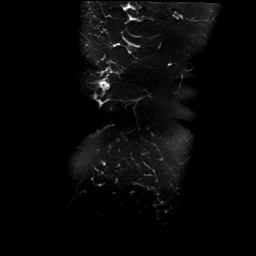

[Series 11: PD fat-sat · coronal · left · 4.0mm · 0.59mm/px · 6 of 28 slices shown (1 of 2)]
[im 1/28]
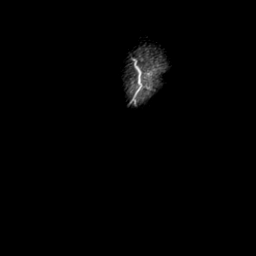
[im 6/28]
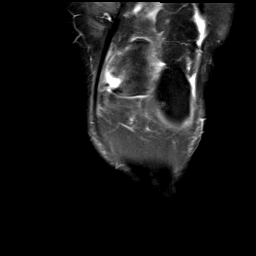
[im 11/28]
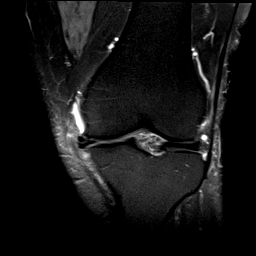
[im 17/28]
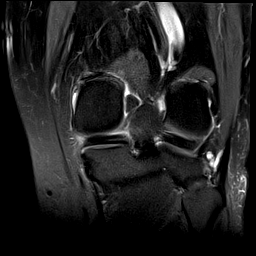
[im 22/28]
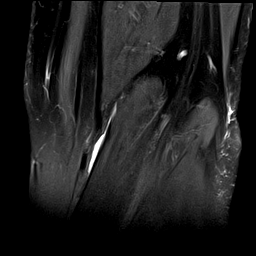
[im 28/28]
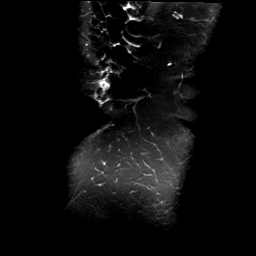

[Series 14: PD · coronal · left · 2.0mm · 0.47mm/px · 2 of 10 slices shown]
[im 1/10]
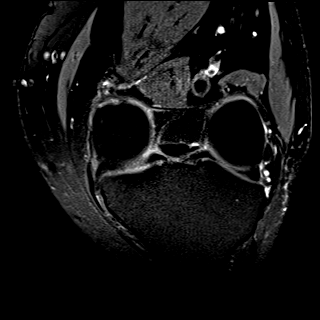
[im 10/10]
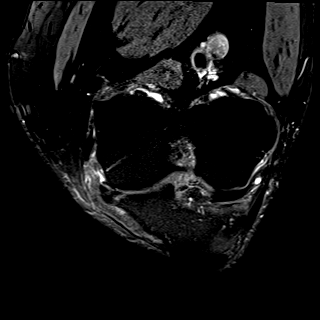

[Series 15: T2 fat-sat · sagittal · left · 3.0mm · 0.59mm/px · 7 of 35 slices shown (3 of 3)]
[im 1/35]
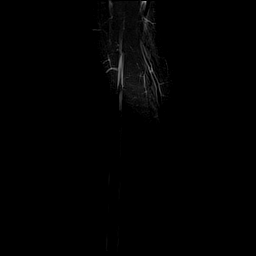
[im 6/35]
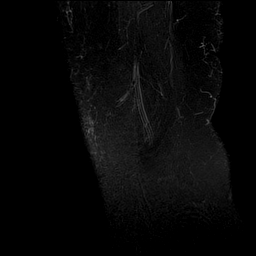
[im 12/35]
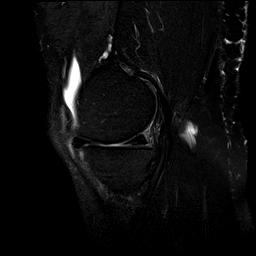
[im 18/35]
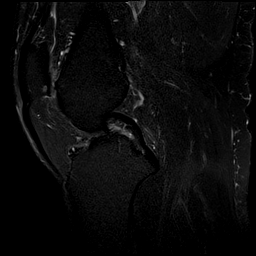
[im 23/35]
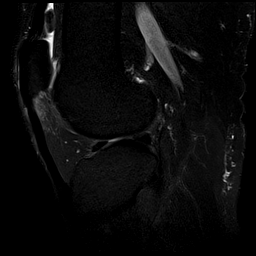
[im 29/35]
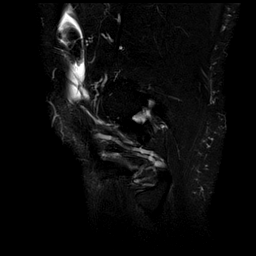
[im 35/35]
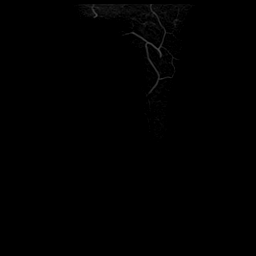

[Series 16: PD fat-sat · sagittal · left · 3.0mm · 0.52mm/px · 8 of 36 slices shown (2 of 2)]
[im 1/36]
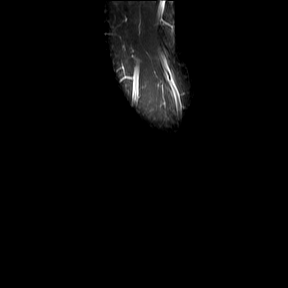
[im 6/36]
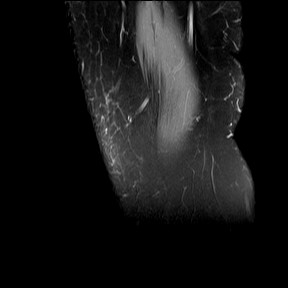
[im 11/36]
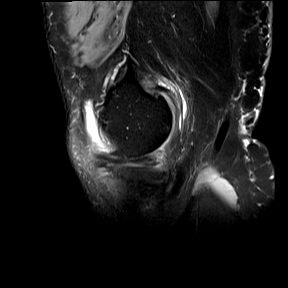
[im 16/36]
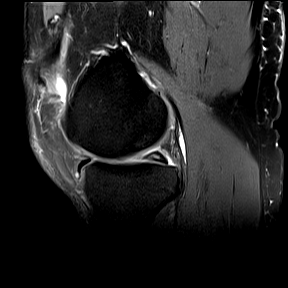
[im 21/36]
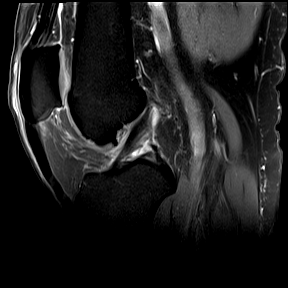
[im 26/36]
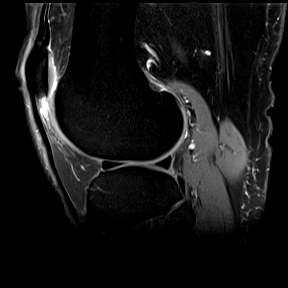
[im 31/36]
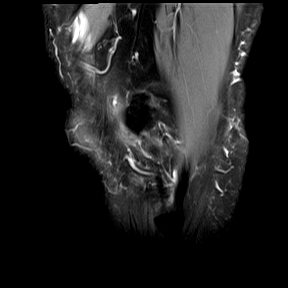
[im 36/36]
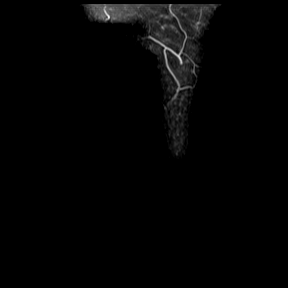

[40 of 40 positions shown; findings below may reference images not displayed]

FINDINGS: MENISCI

Medial meniscus: Intrasubstance degeneration with oblique tear of
the posterior horn and body segments extending to the inferior
articular surface. Tear closely approximates the posterior root
attachment site.

Lateral meniscus: Intact.

LIGAMENTS

Cruciates: Intact ACL and PCL.

Collaterals: Intact MCL which is medially bowed by medial
compartment osteophytes and meniscal tissue. Trace periligamentous
edema which may be reactive or reflect a grade 1 sprain. Lateral
collateral ligament complex intact.

CARTILAGE

Patellofemoral: Chondral thinning with surface irregularity
involving the trochlear groove. No patellar cartilage defect.

Medial: Mild-moderate chondral thinning of the weight-bearing medial
compartment.

Lateral: No chondral defect.

Joint: Trace knee joint effusion. Focal edema within Hoffa's fat
subjacent to the proximal patellar tendon.

Popliteal Fossa:  Small Baker's cyst.  Intact popliteus tendon.

Extensor Mechanism: Tendinosis with low-grade partial-thickness tear
of the proximal patellar tendon. Distal quadrant tendon intact.

Bones: Medial compartment joint space narrowing with marginal
osteophyte formation. Mild reactive subchondral marrow signal
changes within the periphery of the medial tibial plateau. Mild
reactive marrow edema along the inferior margin of the patella. No
fracture. No malalignment. No suspicious bone lesion.

Other: None.
IMPRESSION: 1. Mild-to-moderate medial and patellofemoral compartment
osteoarthritis.
2. Degeneration and tearing of the medial meniscus, as above.
3. Tendinosis with low-grade partial-thickness tear of the proximal
patellar tendon.
4. Trace knee joint effusion and small Baker's cyst.

## 2021-06-28 ENCOUNTER — Telehealth: Payer: Self-pay

## 2021-06-28 DIAGNOSIS — M5136 Other intervertebral disc degeneration, lumbar region: Secondary | ICD-10-CM

## 2021-06-28 NOTE — Telephone Encounter (Signed)
Ok to proceed with ESI

## 2021-06-28 NOTE — Telephone Encounter (Signed)
Pt called and stated he is having a lot of left sided lbp and wants an injection done asap. Please advise pt.

## 2021-06-28 NOTE — Telephone Encounter (Signed)
ESI entered for [facility].

## 2021-06-30 ENCOUNTER — Ambulatory Visit
Admission: RE | Admit: 2021-06-30 | Discharge: 2021-06-30 | Disposition: A | Discharge status: Home / Self Care | Payer: Medicare Other | Source: Ambulatory Visit | Attending: Orthopaedic Surgery | Admitting: Orthopaedic Surgery

## 2021-06-30 ENCOUNTER — Other Ambulatory Visit: Payer: Self-pay

## 2021-06-30 DIAGNOSIS — M5136 Other intervertebral disc degeneration, lumbar region: Secondary | ICD-10-CM

## 2021-06-30 DIAGNOSIS — M47817 Spondylosis without myelopathy or radiculopathy, lumbosacral region: Secondary | ICD-10-CM | POA: Diagnosis not present

## 2021-06-30 IMAGING — XA Imaging study
2 series · 2 of 2 positions shown · non-contrast
Comparison: none

CLINICAL DATA: Lumbosacral spondylosis without myelopathy. Good
response to the last epidural injection in [DATE]. Recent
recurrence of left low back pain radiating down the left leg.

[Series 1: ortho adipose · 1 of 1 slices shown (1 of 2)]
[im 1/1]
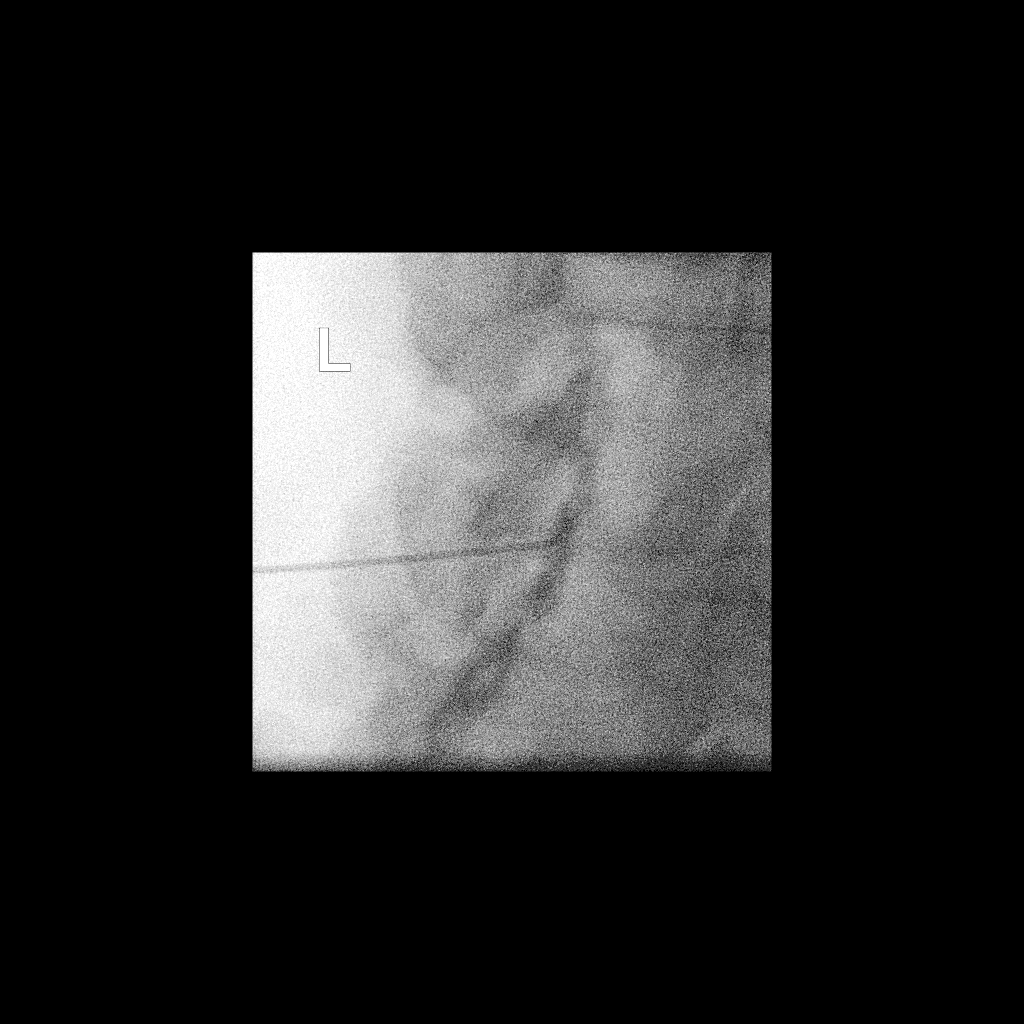

[Series 2: ortho adipose · 1 of 1 slices shown (2 of 2)]
[im 1/1]
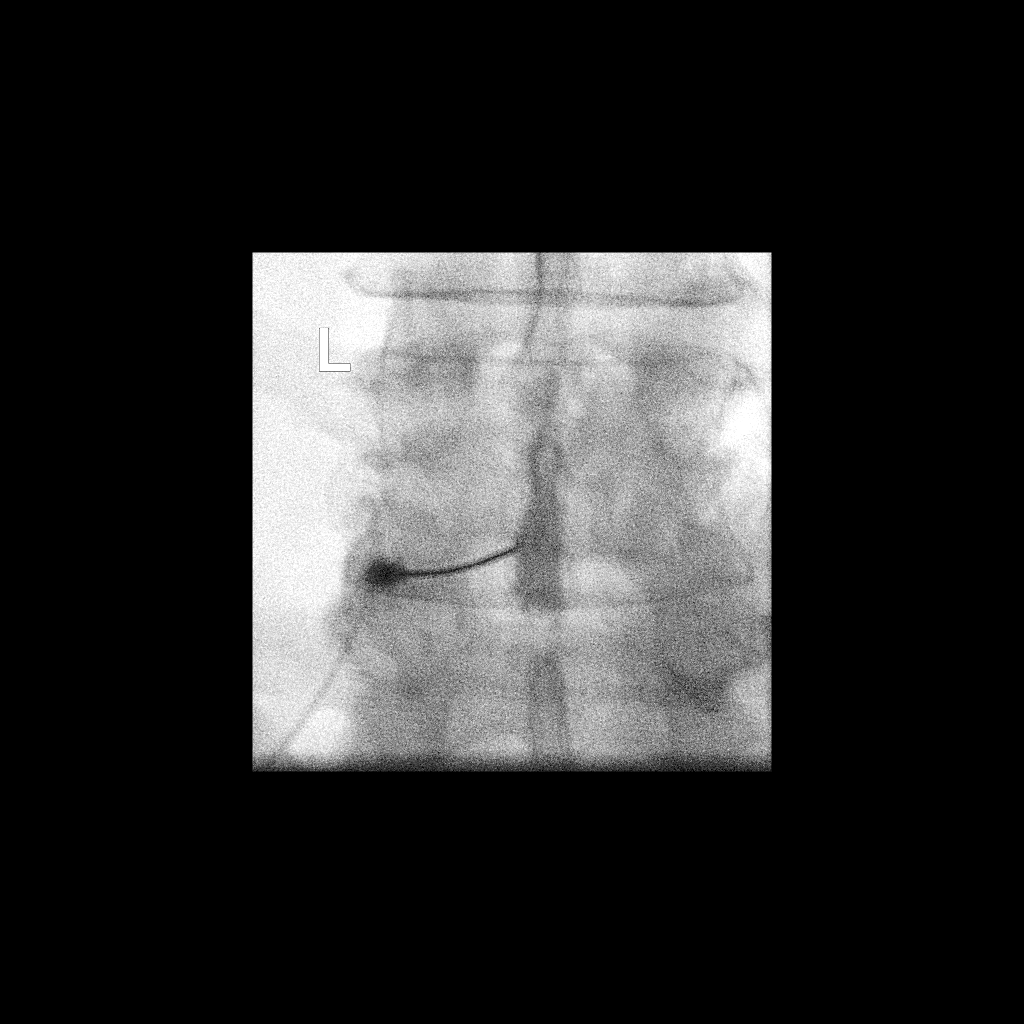

[2 of 2 positions shown; findings below may reference images not displayed]

FLUOROSCOPY TIME:  Fluoroscopy Time: 17 seconds

Radiation Exposure Index: 23.11 microGray*m^2

PROCEDURE:
The procedure, risks, benefits, and alternatives were explained to
the patient. Questions regarding the procedure were encouraged and
answered. The patient understands and consents to the procedure.

LUMBAR EPIDURAL INJECTION:

An interlaminar approach was performed on the left at L4-5. The
overlying skin was cleansed and anesthetized. A 20 gauge epidural
needle was advanced using loss-of-resistance technique.

DIAGNOSTIC EPIDURAL INJECTION:

Injection of Isovue-M 200 shows a good epidural pattern with spread
above and below the level of needle placement, primarily in the
midline and to the left. No vascular opacification is seen.

THERAPEUTIC EPIDURAL INJECTION:

80 mg of Depo-Medrol mixed with 2.5 mL of 1% lidocaine were
instilled. The procedure was well-tolerated, and the patient was
discharged thirty minutes following the injection in good condition.

COMPLICATIONS:
None
IMPRESSION: Technically successful interlaminar epidural injection on the left
at L4-5.

## 2021-06-30 MED ORDER — METHYLPREDNISOLONE ACETATE 40 MG/ML INJ SUSP (RADIOLOG
80.0000 mg | Freq: Once | INTRAMUSCULAR | Status: AC
  Administered 2021-06-30: 80 mg via EPIDURAL

## 2021-06-30 MED ORDER — IOPAMIDOL (ISOVUE-M 200) INJECTION 41%
1.0000 mL | Freq: Once | INTRAMUSCULAR | Status: AC
  Administered 2021-06-30: 1 mL via EPIDURAL

## 2021-06-30 NOTE — Discharge Instructions (Signed)

## 2021-07-18 ENCOUNTER — Emergency Department (HOSPITAL_COMMUNITY)
Admission: EM | Admit: 2021-07-18 | Discharge: 2021-07-18 | Disposition: A | Discharge status: Home / Self Care | Payer: Medicare Other | Source: Home / Self Care

## 2021-07-18 DIAGNOSIS — M5442 Lumbago with sciatica, left side: Secondary | ICD-10-CM | POA: Diagnosis not present

## 2021-07-19 ENCOUNTER — Emergency Department (HOSPITAL_COMMUNITY): Payer: Medicare Other

## 2021-07-19 ENCOUNTER — Other Ambulatory Visit: Payer: Self-pay

## 2021-07-19 ENCOUNTER — Emergency Department (HOSPITAL_COMMUNITY)
Admission: EM | Admit: 2021-07-19 | Discharge: 2021-07-19 | Disposition: A | Discharge status: Home / Self Care | Payer: Medicare Other | Attending: Emergency Medicine | Admitting: Emergency Medicine

## 2021-07-19 ENCOUNTER — Encounter (HOSPITAL_COMMUNITY): Payer: Self-pay

## 2021-07-19 DIAGNOSIS — Z96651 Presence of right artificial knee joint: Secondary | ICD-10-CM | POA: Insufficient documentation

## 2021-07-19 DIAGNOSIS — M25562 Pain in left knee: Secondary | ICD-10-CM | POA: Diagnosis present

## 2021-07-19 DIAGNOSIS — M1712 Unilateral primary osteoarthritis, left knee: Secondary | ICD-10-CM

## 2021-07-19 DIAGNOSIS — M5432 Sciatica, left side: Secondary | ICD-10-CM | POA: Diagnosis not present

## 2021-07-19 DIAGNOSIS — M545 Low back pain, unspecified: Secondary | ICD-10-CM | POA: Diagnosis not present

## 2021-07-19 DIAGNOSIS — M5136 Other intervertebral disc degeneration, lumbar region: Secondary | ICD-10-CM | POA: Diagnosis not present

## 2021-07-19 IMAGING — DX DG LUMBAR SPINE COMPLETE 4+V
5 series · 5 of 5 positions shown · non-contrast
Comparison: None.

CLINICAL DATA: Low back pain.  No injury.

EXAM:
LUMBAR SPINE - COMPLETE 4+ VIEW

[l-spine ap]
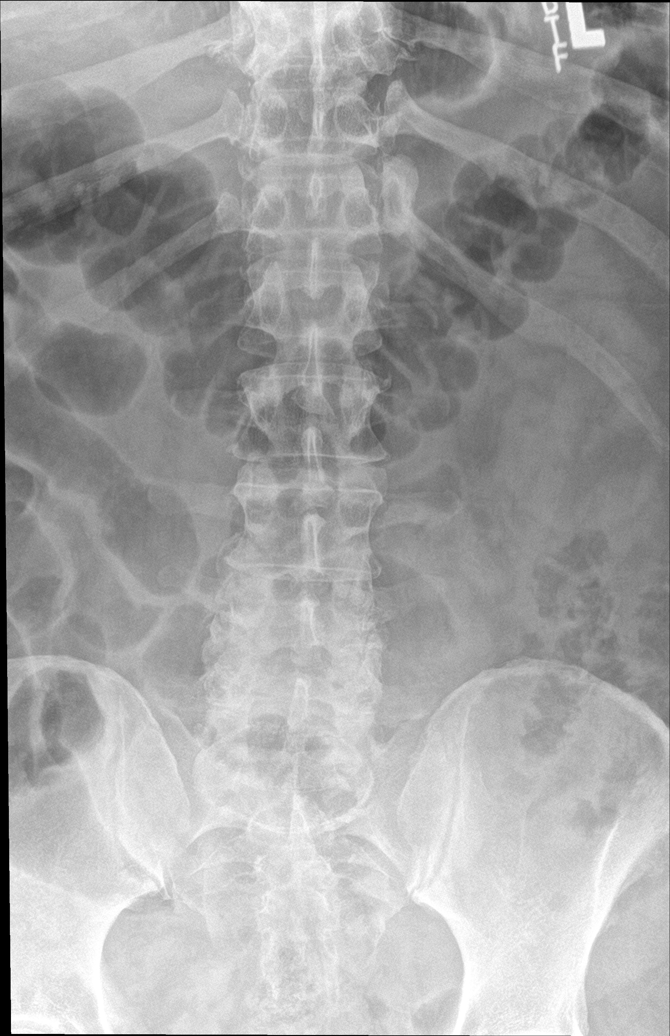

[l-spine obl (1 of 2)]
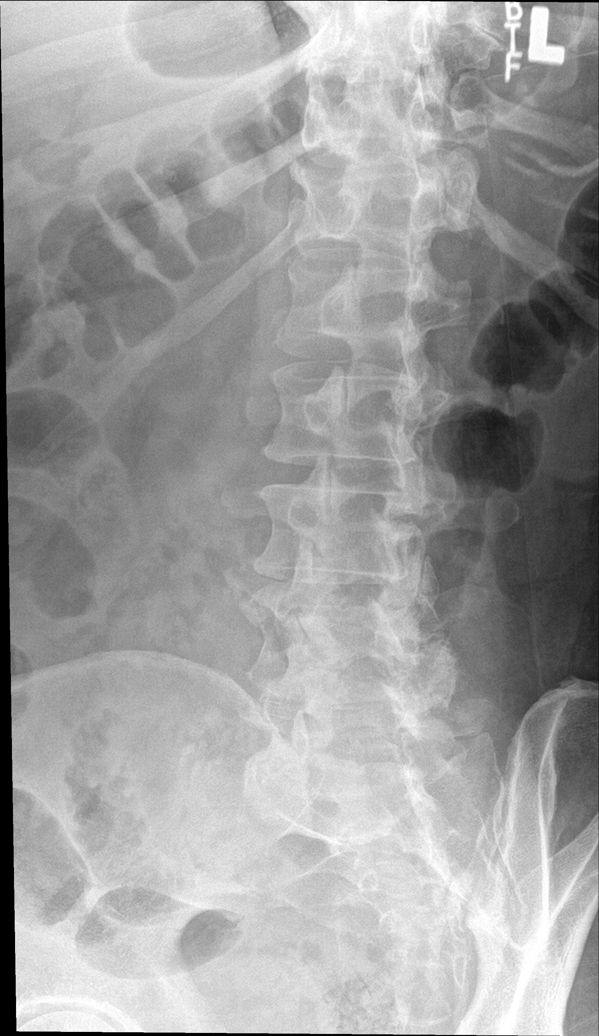

[l-spine obl (2 of 2)]
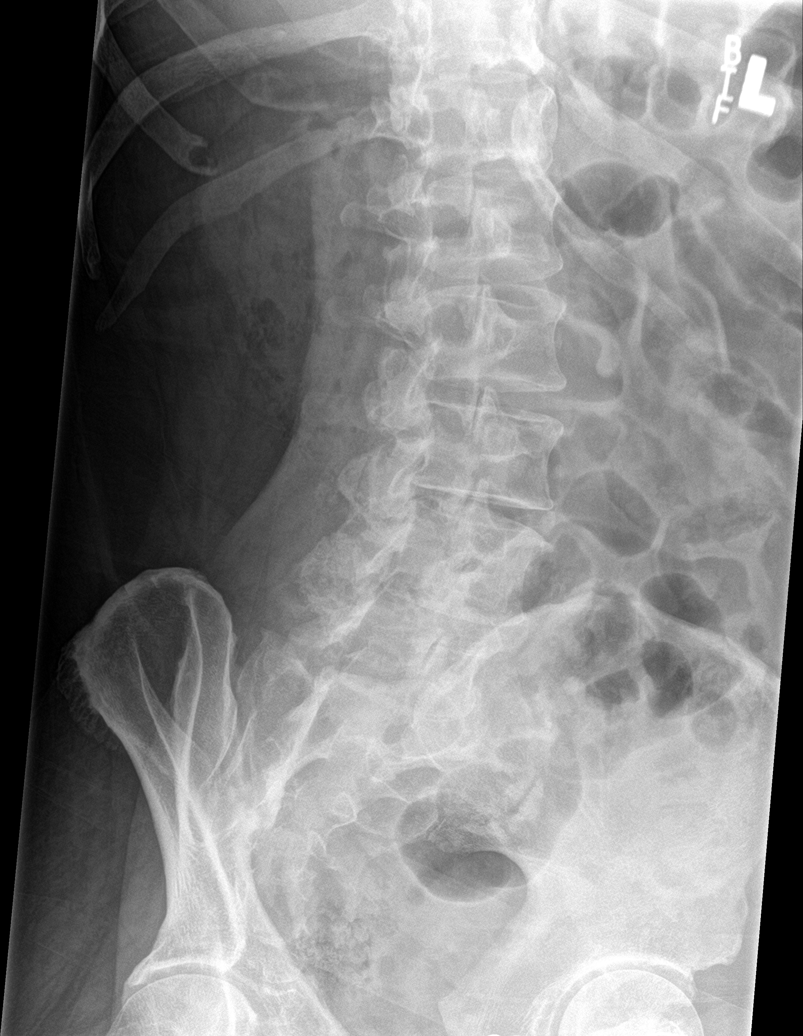

[l-spine lat]
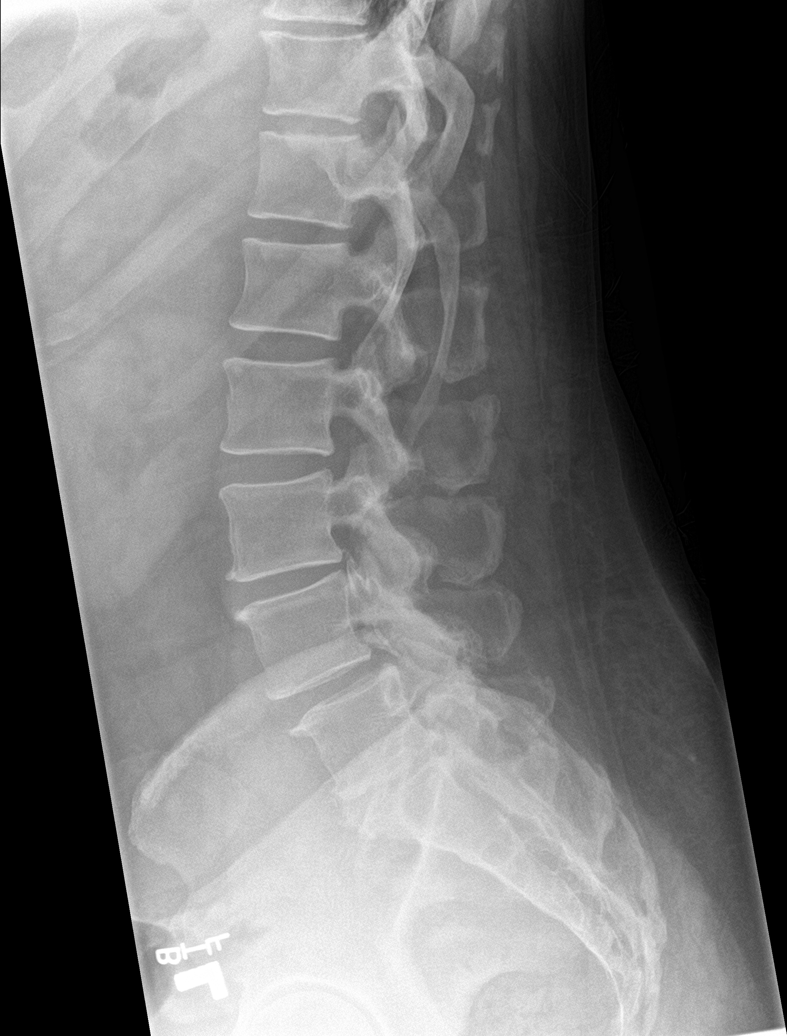

[l-spine spot]
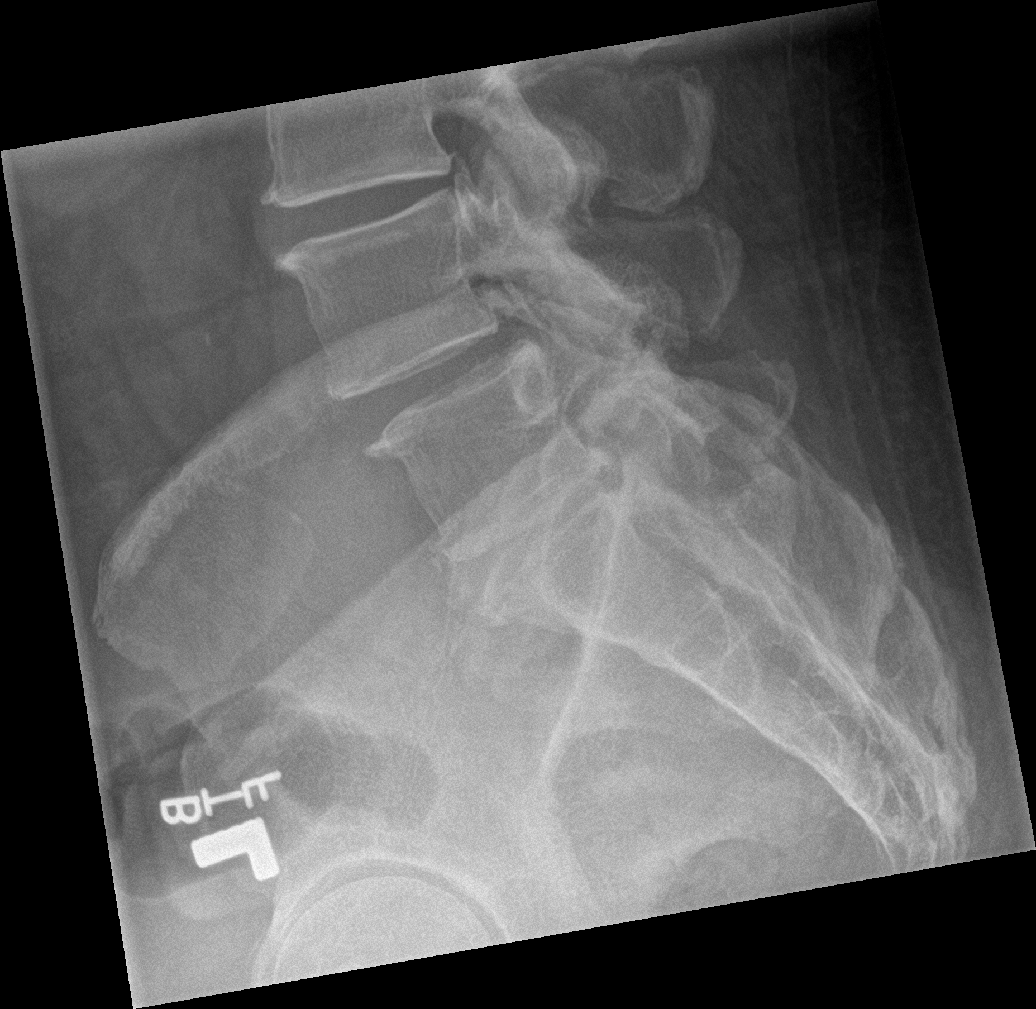

[5 of 5 positions shown; findings below may reference images not displayed]

FINDINGS: No fracture. Trace anterolisthesis noted L4 on 5 with loss of disc
height at L3-4 and L4-5 levels. No evidence for pars defect. SI
joints unremarkable.
IMPRESSION: Degenerative disc disease at L3-4 and L4-5.

## 2021-07-19 MED ORDER — HYDROMORPHONE HCL 1 MG/ML IJ SOLN
1.0000 mg | Freq: Once | INTRAMUSCULAR | Status: AC
  Administered 2021-07-19: 1 mg via INTRAMUSCULAR
  Filled 2021-07-19: qty 1

## 2021-07-19 MED ORDER — HYDROCODONE-ACETAMINOPHEN 5-325 MG PO TABS: 1.0000 | ORAL_TABLET | Freq: Four times a day (QID) | ORAL | 0 refills | Status: AC | PRN

## 2021-07-19 MED ORDER — LIDOCAINE 5 % EX PTCH: 1.0000 | MEDICATED_PATCH | CUTANEOUS | 0 refills | Status: DC

## 2021-07-19 MED ORDER — ONDANSETRON 4 MG PO TBDP
4.0000 mg | ORAL_TABLET | Freq: Once | ORAL | Status: AC
  Administered 2021-07-19: 4 mg via ORAL
  Filled 2021-07-19: qty 1

## 2021-07-19 MED ORDER — LIDOCAINE 5 % EX PTCH
1.0000 | MEDICATED_PATCH | CUTANEOUS | Status: DC
  Administered 2021-07-19: 1 via TRANSDERMAL
  Filled 2021-07-19: qty 1

## 2021-07-19 MED ORDER — PREDNISONE 10 MG PO TABS
ORAL_TABLET | ORAL | 0 refills | Status: AC
Start: 2021-07-19 — End: ?

## 2021-07-19 NOTE — ED Provider Notes (Signed)
[facility] EMERGENCY DEPARTMENT Provider Note   CSN: [id number] Arrival date & time: 07/19/21  0636     History  Chief Complaint  Patient presents with   Leg Pain    [name] is a 66 y.o. male with a history most significant for primary osteoarthritis of the left knee with prior right knee arthroplasty along with left episodic sciatica, under the care of Dr. Durward Fortes.  Last received a steroid injection the end of December and his lumbar spine which she states did adequately improve his pain but has returned since yesterday.  He also endorses significant pain in the left knee, had a recent MRI and is awaiting further management, patient states he is anticipating surgery and is scheduled to see Dr. Durward Fortes next week.  In the interim he endorses increased pain.  He denies weakness or numbness in the leg, denies fevers or chills and has had no urinary or fecal incontinence.  He is currently taking turmeric as an anti-inflammatory and Tylenol which does not relieve his pain.  The history is provided by the patient.      Home Medications Prior to Admission medications   Medication Sig Start Date End Date Taking? Authorizing Provider  acetaminophen (TYLENOL) 325 MG tablet Take 2 tablets (650 mg total) by mouth every 4 (four) hours as needed. Patient taking differently: Take 650 mg by mouth every 4 (four) hours as needed for moderate pain. 08/01/17  Yes Petrarca, Mike Craze, PA-C  HYDROcodone-acetaminophen (NORCO/VICODIN) 5-325 MG tablet Take 1 tablet by mouth every 6 (six) hours as needed for severe pain. 07/19/21  Yes Fredick Schlosser, Almyra Free, PA-C  ibuprofen (ADVIL) 200 MG tablet Take 400-800 mg by mouth every 8 (eight) hours as needed (pain).   Yes [provider]  lidocaine (LIDODERM) 5 % Place 1 patch onto the skin daily. Remove & Discard patch within 12 hours or as directed by MD 07/19/21  Yes Montoya Brandel, Almyra Free, PA-C  losartan-hydrochlorothiazide (HYZAAR) 50-12.5 MG tablet Take 1 tablet by  mouth daily.   Yes [provider]  metFORMIN (GLUCOPHAGE) 500 MG tablet Take 500 mg by mouth in the morning. 05/19/19  Yes [provider]  predniSONE (DELTASONE) 10 MG tablet 6, 5, 4, 3, 2 then 1 tablet by mouth daily for 6 days total. 07/19/21  Yes Ica Daye, Almyra Free, PA-C  sildenafil (REVATIO) 20 MG tablet Take 40-100 mg by mouth daily as needed (erectile dysfunction). 08/16/20  Yes [provider]      Allergies    Flexeril [cyclobenzaprine], Robaxin [methocarbamol], and Penicillins    Review of Systems   Review of Systems  Constitutional:  Negative for fever.  Respiratory:  Negative for shortness of breath.   Cardiovascular:  Negative for chest pain and leg swelling.  Gastrointestinal:  Negative for abdominal distention, abdominal pain and constipation.  Genitourinary:  Negative for difficulty urinating, dysuria, flank pain, frequency and urgency.  Musculoskeletal:  Positive for arthralgias and back pain. Negative for gait problem and joint swelling.  Skin:  Negative for rash.  Neurological:  Negative for weakness and numbness.  All other systems reviewed and are negative.  Physical Exam Updated Vital Signs BP (!) 141/92    Pulse 62    Temp 98 F (36.7 C) (Oral)    Resp 16    Ht 5\' 6"  (1.676 m)    Wt 113.4 kg    SpO2 97%    BMI 40.35 kg/m  Physical Exam Vitals and nursing note reviewed.  Constitutional:  Appearance: He is well-developed.  HENT:     Head: Normocephalic.  Eyes:     Conjunctiva/sclera: Conjunctivae normal.  Cardiovascular:     Rate and Rhythm: Normal rate.     Comments: Pedal pulses normal. Pulmonary:     Effort: Pulmonary effort is normal.  Abdominal:     General: Bowel sounds are normal. There is no distension.     Palpations: Abdomen is soft. There is no mass.  Musculoskeletal:        General: No swelling. Normal range of motion.     Cervical back: Normal range of motion and neck supple.     Lumbar back: Tenderness present. No  swelling, edema or spasms.     Comments: There is no knee joint instability.  He does have tenderness to palpation along the medial left knee joint.  There is no appreciable effusion noted.  No erythema.  He does have moderate crepitus with active and passive range of motion.  Skin:    General: Skin is warm and dry.  Neurological:     Mental Status: He is alert.     Sensory: No sensory deficit.     Motor: No tremor or atrophy.     Gait: Gait normal.     Deep Tendon Reflexes:     Reflex Scores:      Patellar reflexes are 2+ on the right side and 2+ on the left side.    Comments: No strength deficit noted in hip and knee flexor and extensor muscle groups.  Ankle flexion and extension intact.    ED Results / Procedures / Treatments   Labs (all labs ordered are listed, but only abnormal results are displayed) Labs Reviewed - No data to display  EKG None  Radiology DG Lumbar Spine Complete  Result Date: 07/19/2021 CLINICAL DATA:  Low back pain.  No injury. EXAM: LUMBAR SPINE - COMPLETE 4+ VIEW COMPARISON:  None. FINDINGS: No fracture. Trace anterolisthesis noted L4 on 5 with loss of disc height at L3-4 and L4-5 levels. No evidence for pars defect. SI joints unremarkable. IMPRESSION: Degenerative disc disease at L3-4 and L4-5. Electronically Signed   By: Misty Stanley M.D.   On: 07/19/2021 08:14    Procedures Procedures    Medications Ordered in ED Medications  lidocaine (LIDODERM) 5 % 1 patch (1 patch Transdermal Patch Applied 07/19/21 1126)  HYDROmorphone (DILAUDID) injection 1 mg (1 mg Intramuscular Given 07/19/21 1005)  ondansetron (ZOFRAN-ODT) disintegrating tablet 4 mg (4 mg Oral Given 07/19/21 1008)  HYDROmorphone (DILAUDID) injection 1 mg (1 mg Intramuscular Given 07/19/21 1125)    ED Course/ Medical Decision Making/ A&P                           Medical Decision Making Patient with acute on chronic low back pain including sciatica along with left known knee arthritis.  No  red flag findings per history or on exam, doubt cauda equina I.  He does have appointment with orthopedics next week he was encouraged to keep this appointment.  He was given IM Dilaudid here for acute pain relief, he was prescribed a small course of hydrocodone, Lidoderm topical patches and a prednisone taper.  Amount and/or Complexity of Data Reviewed External Data Reviewed: radiology.    Details: Review of recent MRI imaging of his left knee and recent procedure completed December 30 lumbar spine. Radiology: ordered and independent interpretation performed.    Details: No new or acute  findings on his plain film lumbar spine imaging.  Risk Prescription drug management.           Final Clinical Impression(s) / ED Diagnoses Final diagnoses:  Sciatica of left side  Primary osteoarthritis of left knee    Rx / DC Orders ED Discharge Orders          Ordered    HYDROcodone-acetaminophen (NORCO/VICODIN) 5-325 MG tablet  Every 6 hours PRN        07/19/21 1134    predniSONE (DELTASONE) 10 MG tablet        07/19/21 1134    lidocaine (LIDODERM) 5 %  Every 24 hours        07/19/21 1134              Evalee Jefferson, PA-C 07/19/21 1142    Milton Ferguson, MD 07/20/21 1007

## 2021-07-19 NOTE — ED Triage Notes (Signed)
Pt presents to ED with complaints of left sided back pain radiating down left leg since Tuesday.

## 2021-07-19 NOTE — Discharge Instructions (Signed)
Use medications prescribed for your pain relief.  Do not drive within 4 hours of taking the hydrocodone and be cautious with this medication as it can make you drowsy.  I recommend a gentle heating pad to your knee for 20 minutes 3-4 times daily which can help relieve arthritis pain.  You have also been prescribed a pain patch to wear at your left hip and buttock region at the site where your sciatic pain originates.  This may help with your sciatic pain specifically.  Take the entire course of the prednisone taper prescribed, this is a strong anti-inflammatory which should also help your symptoms.  Plan to see your orthopedic provider next Thursday as you have arranged.

## 2021-07-19 NOTE — ED Notes (Signed)
Patient transported to X-ray 

## 2021-07-27 ENCOUNTER — Other Ambulatory Visit: Payer: Self-pay

## 2021-07-27 ENCOUNTER — Ambulatory Visit (INDEPENDENT_AMBULATORY_CARE_PROVIDER_SITE_OTHER): Payer: Medicare Other | Admitting: Orthopaedic Surgery

## 2021-07-27 ENCOUNTER — Encounter: Payer: Self-pay | Admitting: Orthopaedic Surgery

## 2021-07-27 VITALS — Ht 66.0 in | Wt 250.0 lb

## 2021-07-27 DIAGNOSIS — M1712 Unilateral primary osteoarthritis, left knee: Secondary | ICD-10-CM | POA: Diagnosis not present

## 2021-07-27 MED ORDER — BUPIVACAINE HCL 0.5 % IJ SOLN
3.0000 mL | INTRAMUSCULAR | Status: AC | PRN
  Administered 2021-07-27: 3 mL via INTRA_ARTICULAR

## 2021-07-27 MED ORDER — LIDOCAINE HCL 1 % IJ SOLN
0.5000 mL | INTRAMUSCULAR | Status: AC | PRN
Start: 2021-07-27 — End: 2021-07-27
  Administered 2021-07-27: .5 mL

## 2021-07-27 MED ORDER — METHYLPREDNISOLONE ACETATE 40 MG/ML IJ SUSP
40.0000 mg | INTRAMUSCULAR | Status: AC | PRN
  Administered 2021-07-27: 40 mg via INTRA_ARTICULAR

## 2021-07-27 NOTE — Progress Notes (Addendum)
Office Visit Note   Patient: [name]           Date of Birth: October 12, 1955           MRN: [id number] Visit Date: 07/27/2021              Requested by: [name], MD [address] [place],  [place] [address] PCP: Celene Squibb, MD   Assessment & Plan: Visit Diagnoses:  1. Unilateral primary osteoarthritis, left knee     Plan: Left knee injection performed which he tolerated well.  We will recheck him in about 8 weeks.  If he does not respond then we can discuss total knee arthroplasty.  Follow-Up Instructions: No follow-ups on file.   Orders:  Orders Placed This Encounter  Procedures   Large Joint Inj   No orders of the defined types were placed in this encounter.     Procedures: Large Joint Inj: L knee on 07/27/2021 5:02 PM Indications: joint swelling and pain Details: 22 G 1.5 in needle, anterolateral approach  Arthrogram: No  Medications: 0.5 mL lidocaine 1 %; 3 mL bupivacaine 0.5 %; 40 mg methylPREDNISolone acetate 40 MG/ML Outcome: tolerated well, no immediate complications Procedure, treatment alternatives, risks and benefits explained, specific risks discussed. Consent was given by the patient. Immediately prior to procedure a time out was called to verify the correct patient, procedure, equipment, support staff and site/side marked as required. Patient was prepped and draped in the usual sterile fashion.      Clinical Data: No additional findings.   Subjective: Chief Complaint  Patient presents with   Left Knee - Pain   Lower Back - Pain    HPI 66 year old male previous right total knee arthroplasty by Dr. Durward Fortes is here now with progressive left knee pain.  He had an injection in his back in the fall and states this actually helped his knee.  He has had some pain primarily at the medial joint line.  He seen in the emergency room was placed on some prednisone Vicodin Tylenol lidocaine patch has used rub as well as heat.  He has not had  any injections in his left knee.  Radiograph showed some spurring and joint space narrowing of moderate degree.  Review of Systems positive for lumbar spondylosis previous right total knee arthroplasty.   Objective: Vital Signs: Ht 5\' 6"  (1.676 m)    Wt 250 lb (113.4 kg)    BMI 40.35 kg/m   Physical Exam Constitutional:      Appearance: He is well-developed.  HENT:     Head: Normocephalic and atraumatic.     Right Ear: External ear normal.     Left Ear: External ear normal.  Eyes:     Pupils: Pupils are equal, round, and reactive to light.  Neck:     Thyroid: No thyromegaly.     Trachea: No tracheal deviation.  Cardiovascular:     Rate and Rhythm: Normal rate.  Pulmonary:     Effort: Pulmonary effort is normal.     Breath sounds: No wheezing.  Abdominal:     General: Bowel sounds are normal.     Palpations: Abdomen is soft.  Musculoskeletal:     Cervical back: Neck supple.  Skin:    General: Skin is warm and dry.     Capillary Refill: Capillary refill takes less than 2 seconds.  Neurological:     Mental Status: He is alert and oriented to person, place, and time.  Psychiatric:        Behavior: Behavior normal.        Thought Content: Thought content normal.        Judgment: Judgment normal.    Ortho Exam some crepitus with knee extension collateral ligaments are stable ACL PCL is normal more medial than lateral joint line tenderness distal pulses are intact negative logroll the hips negative popliteal compression test no sciatic notch tenderness.  Specialty Comments:  No specialty comments available.  Imaging: No results found.   PMFS History: Patient Active Problem List   Diagnosis Date Noted   Unilateral primary osteoarthritis, left knee 06/15/2020   Degenerative disc disease, lumbar 01/13/2020   Spondylolisthesis, lumbar region 01/13/2020   Rectal bleeding 11/06/2019   History of colonic polyps 11/06/2019   Morbid (severe) obesity due to excess calories  ([place]) 10/07/2019   Chronic pain of right knee 10/08/2018   Chronic right shoulder pain 05/07/2018   History of total right knee replacement 07/30/2017   Colon cancer screening 13/24/4010   Umbilical hernia without mention of obstruction or gangrene 10/28/2013   Past Medical History:  Diagnosis Date   Arthritis    "right knee" (07/30/2017)   CAP (community acquired pneumonia) 2018   Diabetes mellitus without complication ([place])    Hypertension    Hypertension    Wears glasses     Family History  Problem Relation Age of Onset   Colon cancer Neg Hx    Colon polyps Neg Hx     Past Surgical History:  Procedure Laterality Date   COLONOSCOPY  2009   Fields: 3 small tubular adenomas removed.   COLONOSCOPY N/A 12/02/2019    three 4-5 mm polyps (hyperplastic and benign small bowel mucosa) in sigmoid colon at IC valve, redundant colon, non-bleeding internal hemorrhoids.    EYE SURGERY     FLEXIBLE SIGMOIDOSCOPY N/A 08/25/2020   Procedure: FLEXIBLE SIGMOIDOSCOPY WITH PROPOFOL;  Surgeon: [name], DO;  [facility];  Service: Endoscopy;  Laterality: N/A;  7:30am   FLEXIBLE SIGMOIDOSCOPY N/A 09/30/2020   Procedure: FLEXIBLE SIGMOIDOSCOPY;  Surgeon: [name], DO;  Location: [facility];  Service: Endoscopy;  Laterality: N/A;  am appt   JOINT REPLACEMENT     POLYPECTOMY  12/02/2019   Procedure: POLYPECTOMY;  Surgeon: Daneil Dolin, MD;  Location: [facility];  Service: Endoscopy;;   RETINAL DETACHMENT SURGERY Left 2000s   TOTAL KNEE ARTHROPLASTY Right 07/30/2017   TOTAL KNEE ARTHROPLASTY Right 07/30/2017   Procedure: RIGHT TOTAL KNEE ARTHROPLASTY;  Surgeon: [name], MD;  Location: [facility];  Service: Orthopedics;  Laterality: Right;   VASECTOMY     Social History   Occupational History   Not on file  Tobacco Use   Smoking status: Never   Smokeless tobacco: Never  Vaping Use   Vaping Use: Never used  Substance and Sexual Activity   Alcohol use: No    Drug use: No   Sexual activity: Never

## 2021-07-31 ENCOUNTER — Encounter (HOSPITAL_COMMUNITY): Payer: Self-pay | Admitting: *Deleted

## 2021-07-31 ENCOUNTER — Telehealth: Payer: Self-pay | Admitting: Radiology

## 2021-07-31 ENCOUNTER — Emergency Department (HOSPITAL_COMMUNITY)
Admission: EM | Admit: 2021-07-31 | Discharge: 2021-07-31 | Disposition: A | Discharge status: Home / Self Care | Payer: Medicare Other | Attending: Emergency Medicine | Admitting: Emergency Medicine

## 2021-07-31 ENCOUNTER — Other Ambulatory Visit: Payer: Self-pay

## 2021-07-31 DIAGNOSIS — Z7984 Long term (current) use of oral hypoglycemic drugs: Secondary | ICD-10-CM | POA: Diagnosis not present

## 2021-07-31 DIAGNOSIS — E119 Type 2 diabetes mellitus without complications: Secondary | ICD-10-CM | POA: Insufficient documentation

## 2021-07-31 DIAGNOSIS — I10 Essential (primary) hypertension: Secondary | ICD-10-CM | POA: Diagnosis not present

## 2021-07-31 DIAGNOSIS — M5432 Sciatica, left side: Secondary | ICD-10-CM | POA: Diagnosis not present

## 2021-07-31 DIAGNOSIS — M25552 Pain in left hip: Secondary | ICD-10-CM | POA: Diagnosis present

## 2021-07-31 MED ORDER — HYDROMORPHONE HCL 1 MG/ML IJ SOLN
1.0000 mg | Freq: Once | INTRAMUSCULAR | Status: AC
  Administered 2021-07-31: 1 mg via INTRAMUSCULAR
  Filled 2021-07-31: qty 1

## 2021-07-31 MED ORDER — GABAPENTIN 100 MG PO CAPS
100.0000 mg | ORAL_CAPSULE | Freq: Three times a day (TID) | ORAL | 0 refills | Status: AC
Start: 2021-07-31 — End: ?

## 2021-07-31 MED ORDER — LIDOCAINE 5 % EX PTCH
1.0000 | MEDICATED_PATCH | CUTANEOUS | Status: DC
  Administered 2021-07-31: 1 via TRANSDERMAL
  Filled 2021-07-31: qty 1

## 2021-07-31 MED ORDER — MELOXICAM 7.5 MG PO TABS: 7.5000 mg | ORAL_TABLET | Freq: Every day | ORAL | 0 refills | Status: AC

## 2021-07-31 MED ORDER — LIDOCAINE 5 % EX PTCH: 1.0000 | MEDICATED_PATCH | CUTANEOUS | 0 refills | Status: AC

## 2021-07-31 NOTE — Telephone Encounter (Signed)
Please see message from [place] office below and advise. Thanks.   Patient:  [name]  DOB:  [birth year]  Ph #:  [phone]     He called and states his left hip is hurting him really bad and wants to know if he can get a shot in it?

## 2021-07-31 NOTE — Telephone Encounter (Signed)
[name] from [place] office spoke with patient. He has an appointment with his PCP to get injection in his hip tomorrow.

## 2021-07-31 NOTE — ED Triage Notes (Signed)
Pt with lower back pain that radiates down left leg since yesterday.  Pt with hx of same, seen on 07/19/21. Has an appt this Thursday to see specialist.

## 2021-07-31 NOTE — Discharge Instructions (Addendum)
You were seen here today for evaluation of your back pain. I have prescribed you two weeks of Gabapentin and meloxicam for pain and inflammation. Do not take any ibuprofen if on the meloxicam. Please follow up with your PCP appointment tomorrow if they find it appropriate to continue you on these medications long term. Ultimately, you will need to discuss your recurring back pain with your orthopedic provider at your appointment this week for further management of this pain. If you have any fevers, weakness, numbness, tingling, unable to control your bladder or bowel, or not urinating in over 12 hours, return to the ER immediately for re-evaluation.

## 2021-07-31 NOTE — Telephone Encounter (Signed)
Patient called [place] office back and has changed his mind. He made appointment to see Dr. Lorin Mercy on Thursday in [place].

## 2021-07-31 NOTE — ED Provider Notes (Signed)
[facility] EMERGENCY DEPARTMENT Provider Note   CSN: [id number] Arrival date & time: 07/31/21  1747     History Chief Complaint  Patient presents with   Back Pain    [name] is a 66 y.o. male with history of sciatica, diabetes, hypertension presents to the emergency department for evaluation of left hip pain.  Patient reports that supposed yesterday he was experiencing his recurrent sciatic pain that started on the left and radiates to his left anterior thigh to the knee.  He denies any numbness or tingling to the leg.  Denies any weakness.  The patient reports he received cortisone injections by Dr. Durward Fortes but no longer gets them.  He reports he sees a specialist for his knee and will follow up with the specialist this Thursday.  He denies any urinary or fecal incontinence, denies any urinary retention, denies any fever or IV drug use ever.  He usually seen here a few weeks ago for the same problem.  Allergic to muscle relaxers. Denies any new traumas, falls, or MVCs.    Back Pain Associated symptoms: no abdominal pain, no dysuria and no fever       Home Medications Prior to Admission medications   Medication Sig Start Date End Date Taking? Authorizing Provider  gabapentin (NEURONTIN) 100 MG capsule Take 1 capsule (100 mg total) by mouth 3 (three) times daily. 07/31/21  Yes Sherrell Puller, PA-C  meloxicam (MOBIC) 7.5 MG tablet Take 1 tablet (7.5 mg total) by mouth daily. 07/31/21  Yes Sherrell Puller, PA-C  acetaminophen (TYLENOL) 325 MG tablet Take 2 tablets (650 mg total) by mouth every 4 (four) hours as needed. Patient taking differently: Take 650 mg by mouth every 4 (four) hours as needed for moderate pain. 08/01/17   Cherylann Ratel, PA-C  HYDROcodone-acetaminophen (NORCO/VICODIN) 5-325 MG tablet Take 1 tablet by mouth every 6 (six) hours as needed for severe pain. 07/19/21   Evalee Jefferson, PA-C  ibuprofen (ADVIL) 200 MG tablet Take 400-800 mg by mouth every 8 (eight) hours  as needed (pain).    [provider]  lidocaine (LIDODERM) 5 % Place 1 patch onto the skin daily. Remove & Discard patch within 12 hours or as directed by MD 07/31/21   Sherrell Puller, PA-C  losartan-hydrochlorothiazide (HYZAAR) 50-12.5 MG tablet Take 1 tablet by mouth daily.    [provider]  metFORMIN (GLUCOPHAGE) 500 MG tablet Take 500 mg by mouth in the morning. 05/19/19   [provider]  predniSONE (DELTASONE) 10 MG tablet 6, 5, 4, 3, 2 then 1 tablet by mouth daily for 6 days total. Patient not taking: Reported on 07/27/2021 07/19/21   Evalee Jefferson, PA-C  sildenafil (REVATIO) 20 MG tablet Take 40-100 mg by mouth daily as needed (erectile dysfunction). 08/16/20   [provider]      Allergies    Flexeril [cyclobenzaprine], Robaxin [methocarbamol], and Penicillins    Review of Systems   Review of Systems  Constitutional:  Negative for chills and fever.  Gastrointestinal:  Negative for abdominal pain, constipation, diarrhea, nausea and vomiting.  Genitourinary:  Negative for decreased urine volume, difficulty urinating, dysuria, enuresis, flank pain, hematuria and urgency.  Musculoskeletal:  Positive for back pain. Negative for joint swelling and neck pain.  Skin:  Negative for rash.   Physical Exam Updated Vital Signs BP (!) 160/95 (BP Location: Right Arm)    Pulse 74    Temp 97.7 F (36.5 C) (Oral)    Resp 18  SpO2 97%  Physical Exam Vitals and nursing note reviewed.  Constitutional:      General: He is not in acute distress.    Appearance: Normal appearance. He is not toxic-appearing.  HENT:     Head: Normocephalic and atraumatic.  Eyes:     General: No scleral icterus. Cardiovascular:     Rate and Rhythm: Normal rate and regular rhythm.  Pulmonary:     Effort: Pulmonary effort is normal. No respiratory distress.     Breath sounds: Normal breath sounds.  Abdominal:     General: Bowel sounds are normal.     Palpations: Abdomen is soft.      Tenderness: There is no guarding or rebound.  Musculoskeletal:        General: No deformity.     Cervical back: Normal range of motion.     Comments: No overlying skin changes noted.  No rashes, ecchymosis, abrasion, or erythema noted.  No palpable or visualized step-offs or deformities.  No midline or paraspinal tenderness palpation.  Patient experiencing tenderness palpation in the left glued overlying the SI joint.  No overlying skin changes noted here as well.  Patient has equal strength in bilateral lower and upper extremities.  Sensation intact throughout.  Palpable DP and PT pulses.  Negative straight leg raise.  Negative contralateral straight leg raise.  Compartments are soft.  Patellar reflexes intact. Ambulatory with mildly analgesic gait without assistance.  Skin:    General: Skin is warm and dry.  Neurological:     General: No focal deficit present.     Mental Status: He is alert. Mental status is at baseline.    ED Results / Procedures / Treatments   Labs (all labs ordered are listed, but only abnormal results are displayed) Labs Reviewed - No data to display  EKG None  Radiology No results found.  Procedures Procedures   Medications Ordered in ED Medications  lidocaine (LIDODERM) 5 % 1 patch (1 patch Transdermal Patch Applied 07/31/21 1941)  HYDROmorphone (DILAUDID) injection 1 mg (1 mg Intramuscular Given 07/31/21 1942)    ED Course/ Medical Decision Making/ A&P                           Medical Decision Making Risk Prescription drug management.   66 year old male presents emergency department for evaluation of acute on chronic back pain.  Differential diagnosis includes but is not limited to cauda equina, sciatica, chronic back pain, epidural abscess.  Low suspicion for epidural abscess as the patient reports this feels like his sciatica and he is afebrile and denies any IV drug use ever.  Will decision for cauda equina as the patient does not have any  urinary fecal incontinence or urinary retention.  I do not feel that imaging is needed at this time as the patient just recently had imaging a few weeks ago and is not experiencing any new trauma or fall.  We will give patient Dilaudid that he received in the emergency room previously with some relief of pain.  Ordered lidocaine patch as well.  On reevaluation after the pain medication, the patient reports he is feeling no pain and is fairly comfortable.  He has ambulatory with a mildly analgesic gait in the emergency department without assistance.  This patient is likely experiencing his typical sciatica pain.  As the patient just finished prednisone a few days ago, will give patient other medication for anti-inflammatory.  We will give patient 2  weeks of meloxicam and gabapentin.  Patient is following up with his primary care provider tomorrow.  I discussed with him that he can talk with his primary care provider about continuation of these medications if they deem necessary.  Patient also has follow-up appointment with his orthopedic later this week.  I discussed with him to discuss with his orthopedic provider about his recurring sciatic pain in addition to his chronic knee pain that he sees them for.  Return precautions given such as urinary and fecal incontinence or urinary retention.  The patient verbalized understanding agrees to plan.  Wife is driving patient home.  Patient is stable being discharged home in good condition.  Final Clinical Impression(s) / ED Diagnoses Final diagnoses:  Sciatica of left side    Rx / DC Orders ED Discharge Orders          Ordered    gabapentin (NEURONTIN) 100 MG capsule  3 times daily        07/31/21 2022    meloxicam (MOBIC) 7.5 MG tablet  Daily        07/31/21 2022    lidocaine (LIDODERM) 5 %  Every 24 hours        07/31/21 2022              Sherrell Puller, PA-C 08/02/21 1557    Hayden Rasmussen, MD 08/03/21 2023

## 2021-08-01 DIAGNOSIS — M25552 Pain in left hip: Secondary | ICD-10-CM | POA: Diagnosis not present

## 2021-08-01 DIAGNOSIS — M5442 Lumbago with sciatica, left side: Secondary | ICD-10-CM | POA: Diagnosis not present

## 2021-08-03 ENCOUNTER — Ambulatory Visit (INDEPENDENT_AMBULATORY_CARE_PROVIDER_SITE_OTHER): Payer: Medicare Other | Admitting: Orthopaedic Surgery

## 2021-08-03 ENCOUNTER — Encounter: Payer: Self-pay | Admitting: Orthopaedic Surgery

## 2021-08-03 ENCOUNTER — Other Ambulatory Visit: Payer: Self-pay

## 2021-08-03 VITALS — Ht 66.0 in | Wt 250.0 lb

## 2021-08-03 DIAGNOSIS — M4316 Spondylolisthesis, lumbar region: Secondary | ICD-10-CM | POA: Diagnosis not present

## 2021-08-03 DIAGNOSIS — M5126 Other intervertebral disc displacement, lumbar region: Secondary | ICD-10-CM | POA: Diagnosis not present

## 2021-08-03 NOTE — Progress Notes (Signed)
Office Visit Note   Patient: [name]           Date of Birth: 09-03-1955           MRN: [id number] Visit Date: 08/03/2021              Requested by: Celene Squibb, MD [address] [place],  [place] [address] PCP: Celene Squibb, MD   Assessment & Plan: Visit Diagnoses:  1. Protrusion of lumbar intervertebral disc   2. Spondylolisthesis, lumbar region     Plan: We will set patient up for an epidural steroid injection which has been effective in the past.  His MRI shows some Extraforaminal protrusion L2-3, moderate left paracentral disc protrusion L3-4 with moderate stenosis and also some anterolisthesis at L4-5 but no significant lateral recess narrowing as of 02/08/2020.  He can follow-up with me if he is not getting relief we will need to consider reimaging studies. Follow-Up Instructions: Return in about 3 weeks (around 08/24/2021).   Orders:  Orders Placed This Encounter  Procedures   Epidural Steroid Injection - Lumbar/Sacral (Ancillary Performed)   No orders of the defined types were placed in this encounter.     Procedures: No procedures performed   Clinical Data: No additional findings.   Subjective: Chief Complaint  Patient presents with   Left Leg - Pain    HPI 66 year old male previous right total knee arthroplasty by Dr. Durward Fortes.  He seen in the ED 07/31/2021 with sciatica.  He has been on gabapentin and meloxicam and given a prednisone taper on 07/19/2021.  He is having pain from his buttocks around his left hip into his thigh region.  Patient is wondering if he needs an injection in his hip joint and points to the sciatic notch on the left for he thinks he needs the shot.  Review of Systems 14 point system update unchanged from 07/27/2021.   Objective: Vital Signs: Ht 5\' 6"  (1.676 m)    Wt 250 lb (113.4 kg)    BMI 40.35 kg/m   Physical Exam Constitutional:      Appearance: He is well-developed.  HENT:     Head: Normocephalic and  atraumatic.     Right Ear: External ear normal.     Left Ear: External ear normal.  Eyes:     Pupils: Pupils are equal, round, and reactive to light.  Neck:     Thyroid: No thyromegaly.     Trachea: No tracheal deviation.  Cardiovascular:     Rate and Rhythm: Normal rate.  Pulmonary:     Effort: Pulmonary effort is normal.     Breath sounds: No wheezing.  Abdominal:     General: Bowel sounds are normal.     Palpations: Abdomen is soft.  Musculoskeletal:     Cervical back: Neck supple.  Skin:    General: Skin is warm and dry.     Capillary Refill: Capillary refill takes less than 2 seconds.  Neurological:     Mental Status: He is alert and oriented to person, place, and time.  Psychiatric:        Behavior: Behavior normal.        Thought Content: Thought content normal.        Judgment: Judgment normal.    Ortho Exam patient has sciatic notch tenderness on the left.  No pain with internal/external rotation of the hip.  Crepitus with left knee range of motion.  Well-healed right total knee arthroplasty.  Pulses are palpable.  Specialty Comments:  No specialty comments available.  Imaging: foraminal narrowing is present bilaterally.   IMPRESSION: 1. Broad-based foraminal and extraforaminal disc protrusion on the left at L2-3 with resulting extraforaminal left L2 nerve root encroachment. 2. Moderate size left paracentral disc protrusion at L3-4 with resulting moderate spinal stenosis and asymmetric narrowing of the left lateral recess and left foramen. 3. Mild disc bulging, moderate to advanced facet hypertrophy and a resulting grade 1 anterolisthesis at L4-5 with mild narrowing of the lateral recesses and foramina bilaterally. 4. Broad-based disc protrusion in the right subarticular zone at L5-S1 with resulting posterior displacement of the right S1 nerve root. Mild foraminal narrowing bilaterally.     Electronically Signed   By: Richardean Sale M.D.   On:  02/08/2020 15:51     PMFS History: Patient Active Problem List   Diagnosis Date Noted   Unilateral primary osteoarthritis, left knee 06/15/2020   Degenerative disc disease, lumbar 01/13/2020   Spondylolisthesis, lumbar region 01/13/2020   Rectal bleeding 11/06/2019   History of colonic polyps 11/06/2019   Morbid (severe) obesity due to excess calories ([place]) 10/07/2019   Chronic pain of right knee 10/08/2018   Chronic right shoulder pain 05/07/2018   History of total right knee replacement 07/30/2017   Colon cancer screening 67/34/1937   Umbilical hernia without mention of obstruction or gangrene 10/28/2013   Past Medical History:  Diagnosis Date   Arthritis    "right knee" (07/30/2017)   CAP (community acquired pneumonia) 2018   Diabetes mellitus without complication ([place])    Hypertension    Hypertension    Wears glasses     Family History  Problem Relation Age of Onset   Colon cancer Neg Hx    Colon polyps Neg Hx     Past Surgical History:  Procedure Laterality Date   COLONOSCOPY  2009   Fields: 3 small tubular adenomas removed.   COLONOSCOPY N/A 12/02/2019    three 4-5 mm polyps (hyperplastic and benign small bowel mucosa) in sigmoid colon at IC valve, redundant colon, non-bleeding internal hemorrhoids.    EYE SURGERY     FLEXIBLE SIGMOIDOSCOPY N/A 08/25/2020   Procedure: FLEXIBLE SIGMOIDOSCOPY WITH PROPOFOL;  Surgeon: [name], DO;  [facility];  Service: Endoscopy;  Laterality: N/A;  7:30am   FLEXIBLE SIGMOIDOSCOPY N/A 09/30/2020   Procedure: FLEXIBLE SIGMOIDOSCOPY;  Surgeon: [name], DO;  [facility];  Service: Endoscopy;  Laterality: N/A;  am appt   JOINT REPLACEMENT     POLYPECTOMY  12/02/2019   Procedure: POLYPECTOMY;  Surgeon: Daneil Dolin, MD;  [facility]: [facility];  Service: Endoscopy;;   RETINAL DETACHMENT SURGERY Left 2000s   TOTAL KNEE ARTHROPLASTY Right 07/30/2017   TOTAL KNEE ARTHROPLASTY Right 07/30/2017    Procedure: RIGHT TOTAL KNEE ARTHROPLASTY;  Surgeon: [name], MD;  Location: [place];  Service: Orthopedics;  Laterality: Right;   VASECTOMY     Social History   Occupational History   Not on file  Tobacco Use   Smoking status: Never   Smokeless tobacco: Never  Vaping Use   Vaping Use: Never used  Substance and Sexual Activity   Alcohol use: No   Drug use: No   Sexual activity: Never

## 2021-08-04 ENCOUNTER — Other Ambulatory Visit: Payer: Self-pay

## 2021-08-04 ENCOUNTER — Ambulatory Visit
Admission: RE | Admit: 2021-08-04 | Discharge: 2021-08-04 | Disposition: A | Discharge status: Home / Self Care | Payer: Medicare Other | Source: Ambulatory Visit | Attending: Orthopaedic Surgery | Admitting: Orthopaedic Surgery

## 2021-08-04 DIAGNOSIS — M47817 Spondylosis without myelopathy or radiculopathy, lumbosacral region: Secondary | ICD-10-CM | POA: Diagnosis not present

## 2021-08-04 DIAGNOSIS — M5126 Other intervertebral disc displacement, lumbar region: Secondary | ICD-10-CM

## 2021-08-04 IMAGING — XA Imaging study
2 series · 2 of 2 positions shown · non-contrast
Comparison: none

CLINICAL DATA: Lumbosacral spondylosis without myelopathy.
Left-sided low back and leg pain. Some improvement after last L4-L5
epidural injection a month ago, but only for a week. L3-L4 epidural
injection specifically requested today.

[Series 1: ortho standard · 1 of 1 slices shown (1 of 2)]
[im 1/1]
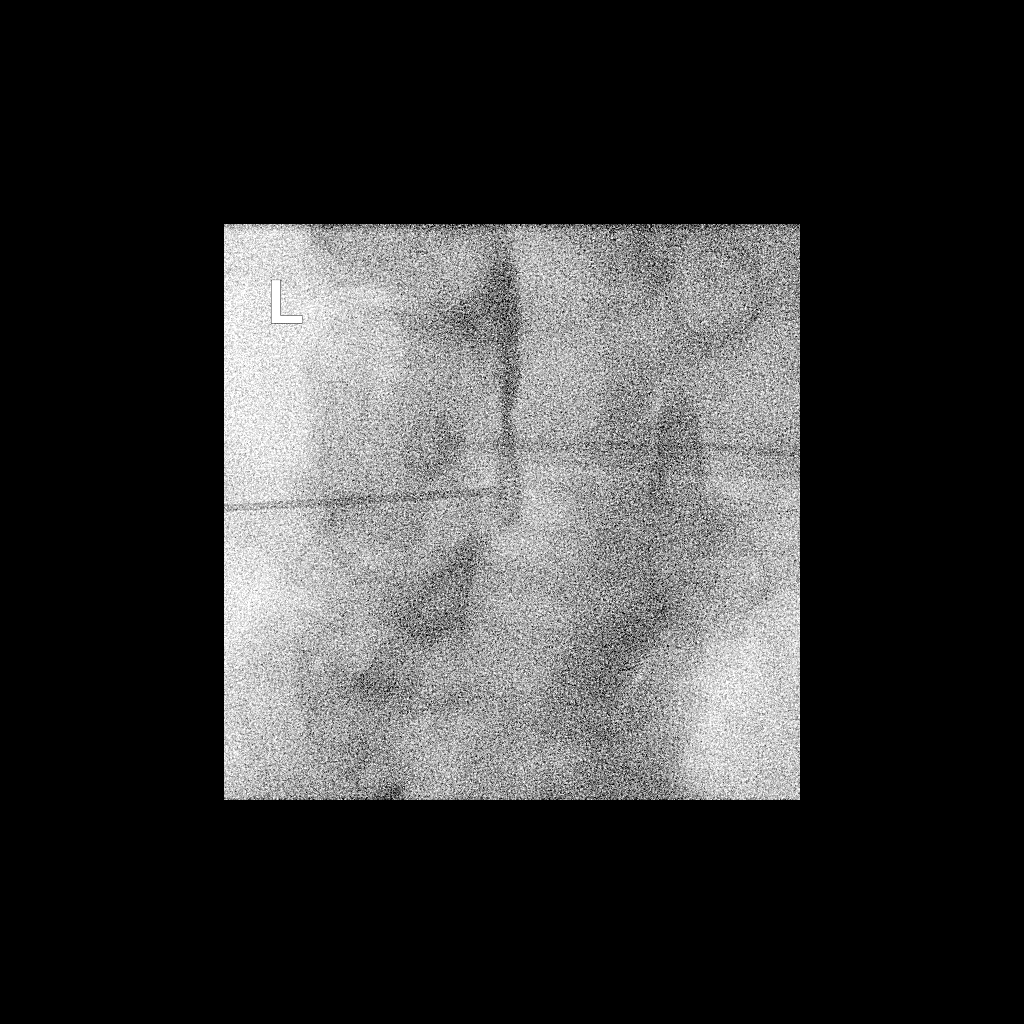

[Series 2: ortho standard · 1 of 1 slices shown (2 of 2)]
[im 1/1]
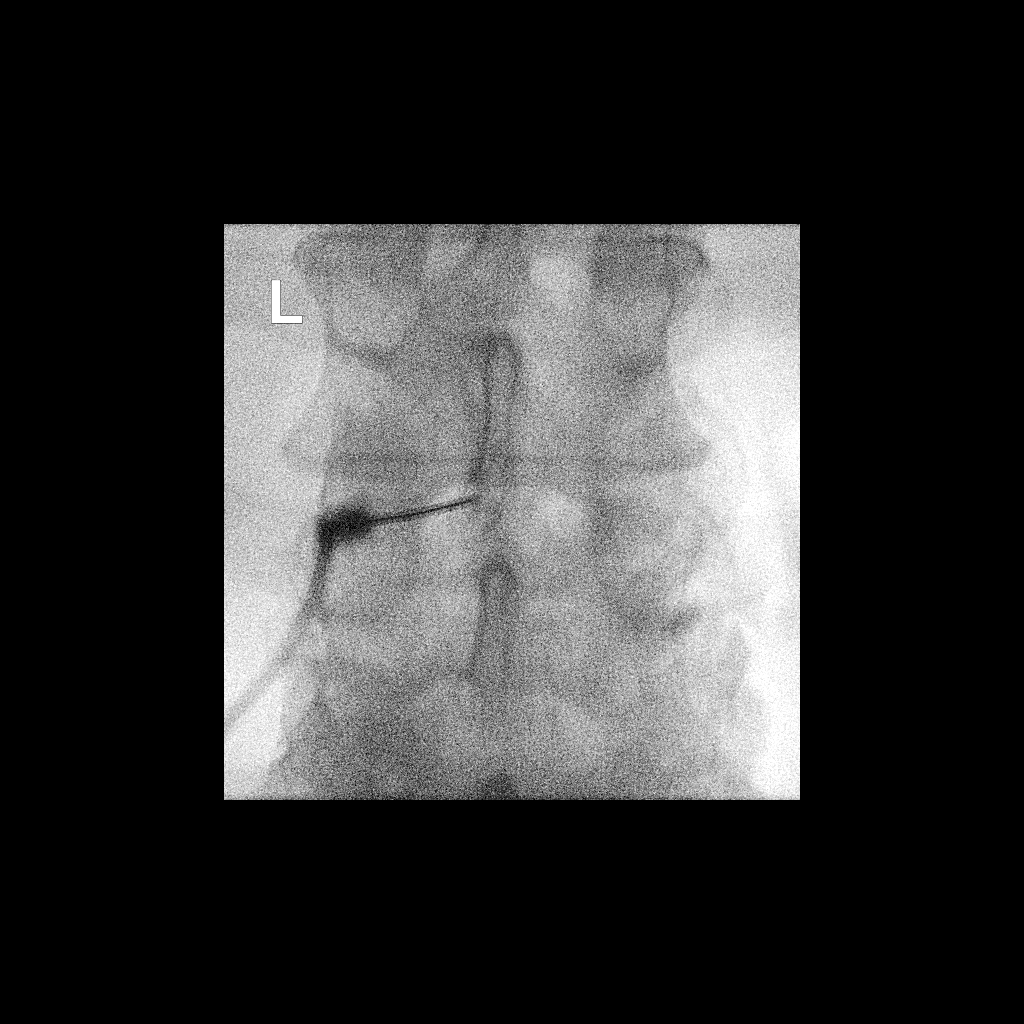

[2 of 2 positions shown; findings below may reference images not displayed]

FLUOROSCOPY:
Radiation Exposure Index (as provided by the fluoroscopic device):
3.3 mGy mGy Kerma

PROCEDURE:
The procedure, risks, benefits, and alternatives were explained to
the patient. Questions regarding the procedure were encouraged and
answered. The patient understands and consents to the procedure.

LUMBAR EPIDURAL INJECTION:

An interlaminar approach was performed on the left at L3-L4. The
overlying skin was cleansed and anesthetized. A 3.5 inch 20 gauge
epidural needle was advanced using loss-of-resistance technique.

DIAGNOSTIC EPIDURAL INJECTION:

Injection of Isovue-M 200 shows a good epidural pattern with spread
above and below the level of needle placement, primarily on the
left. No vascular opacification is seen.

THERAPEUTIC EPIDURAL INJECTION:

80 mg of Depo-Medrol mixed with 3 mL of 1% lidocaine were instilled.
The procedure was well-tolerated, and the patient was discharged
thirty minutes following the injection in good condition.

COMPLICATIONS:
None immediate.
IMPRESSION: Technically successful interlaminar epidural injection on the left
at L3-L4.

## 2021-08-04 MED ORDER — METHYLPREDNISOLONE ACETATE 40 MG/ML INJ SUSP (RADIOLOG
80.0000 mg | Freq: Once | INTRAMUSCULAR | Status: AC
  Administered 2021-08-04: 80 mg via EPIDURAL

## 2021-08-04 MED ORDER — IOPAMIDOL (ISOVUE-M 200) INJECTION 41%
1.0000 mL | Freq: Once | INTRAMUSCULAR | Status: AC
  Administered 2021-08-04: 1 mL via EPIDURAL

## 2021-08-04 NOTE — Discharge Instructions (Signed)

## 2021-08-22 DIAGNOSIS — I10 Essential (primary) hypertension: Secondary | ICD-10-CM | POA: Diagnosis not present

## 2021-08-22 DIAGNOSIS — E1165 Type 2 diabetes mellitus with hyperglycemia: Secondary | ICD-10-CM | POA: Diagnosis not present

## 2021-08-24 ENCOUNTER — Other Ambulatory Visit: Payer: Self-pay

## 2021-08-24 ENCOUNTER — Encounter: Payer: Self-pay | Admitting: Orthopaedic Surgery

## 2021-08-24 ENCOUNTER — Ambulatory Visit (INDEPENDENT_AMBULATORY_CARE_PROVIDER_SITE_OTHER): Payer: Medicare Other | Admitting: Orthopaedic Surgery

## 2021-08-24 VITALS — Ht 66.0 in | Wt 250.0 lb

## 2021-08-24 DIAGNOSIS — M5136 Other intervertebral disc degeneration, lumbar region: Secondary | ICD-10-CM | POA: Diagnosis not present

## 2021-08-24 DIAGNOSIS — M4316 Spondylolisthesis, lumbar region: Secondary | ICD-10-CM | POA: Diagnosis not present

## 2021-08-24 NOTE — Progress Notes (Signed)
Office Visit Note   Patient: [name]           Date of Birth: 1956-02-28           MRN: [id number] Visit Date: 08/24/2021              Requested by: Celene Squibb, MD [place],  [place] [address] PCP: Celene Squibb, MD   Assessment & Plan: Visit Diagnoses:  1. Spondylolisthesis, lumbar region   2. Degenerative disc disease, lumbar     Plan: Patient is encouraged to continue with gradual weight loss.  He will slowly work back up with his walking program until he gets to 2 miles per day going slowly.  He can return if he gets increase in symptoms.  Right total knee by Dr. Durward Fortes doing well he has some left knee osteoarthritis but not severe enough to consider total knee arthroplasty on the left knee at this point.  He can follow-up with me as needed.  Follow-Up Instructions: No follow-ups on file.   Orders:  No orders of the defined types were placed in this encounter.  No orders of the defined types were placed in this encounter.     Procedures: No procedures performed   Clinical Data: No additional findings.   Subjective: Chief Complaint  Patient presents with   Lower Back - Pain, Follow-up    HPI patient turns for follow-up low back pain more symptoms of left than right with moderate-sized left paracentral disc protrusion L3-4 with moderate central stenosis.  Mild bulging grade 1 anterolisthesis at L4-5 with lateral recess narrowing bilaterally.  States his left leg is much improved from his epidural injection 3 weeks ago on 08/04/2021.  He had some gabapentin states he thinks it may have caused a rash over his left thigh.    Review of Systems 14 point system update unchanged.   Objective: Vital Signs: Ht 5\' 6"  (1.676 m)    Wt 250 lb (113.4 kg)    BMI 40.35 kg/m   Physical Exam Constitutional:      Appearance: He is well-developed.  HENT:     Head: Normocephalic and atraumatic.     Right Ear: External ear normal.     Left Ear:  External ear normal.  Eyes:     Pupils: Pupils are equal, round, and reactive to light.  Neck:     Thyroid: No thyromegaly.     Trachea: No tracheal deviation.  Cardiovascular:     Rate and Rhythm: Normal rate.  Pulmonary:     Effort: Pulmonary effort is normal.     Breath sounds: No wheezing.  Abdominal:     General: Bowel sounds are normal.     Palpations: Abdomen is soft.  Musculoskeletal:     Cervical back: Neck supple.  Skin:    General: Skin is warm and dry.     Capillary Refill: Capillary refill takes less than 2 seconds.  Neurological:     Mental Status: He is alert and oriented to person, place, and time.  Psychiatric:        Behavior: Behavior normal.        Thought Content: Thought content normal.        Judgment: Judgment normal.    Ortho Exam patient has some areas of discoloration anteriorly over his thigh that look more like a light-colored birthmarks.  No cellulitis is not raised.  He is ambulatory without limping.  Negative straight leg raising  90 degrees.  Specialty Comments:  No specialty comments available.  Imaging: No results found.   PMFS History: Patient Active Problem List   Diagnosis Date Noted   Unilateral primary osteoarthritis, left knee 06/15/2020   Degenerative disc disease, lumbar 01/13/2020   Spondylolisthesis, lumbar region 01/13/2020   Rectal bleeding 11/06/2019   History of colonic polyps 11/06/2019   Morbid (severe) obesity due to excess calories ([place]) 10/07/2019   Chronic pain of right knee 10/08/2018   Chronic right shoulder pain 05/07/2018   History of total right knee replacement 07/30/2017   Colon cancer screening 16/07/930   Umbilical hernia without mention of obstruction or gangrene 10/28/2013   Past Medical History:  Diagnosis Date   Arthritis    "right knee" (07/30/2017)   CAP (community acquired pneumonia) 2018   Diabetes mellitus without complication ([place])    Hypertension    Hypertension    Wears glasses      Family History  Problem Relation Age of Onset   Colon cancer Neg Hx    Colon polyps Neg Hx     Past Surgical History:  Procedure Laterality Date   COLONOSCOPY  2009   Fields: 3 small tubular adenomas removed.   COLONOSCOPY N/A 12/02/2019    three 4-5 mm polyps (hyperplastic and benign small bowel mucosa) in sigmoid colon at IC valve, redundant colon, non-bleeding internal hemorrhoids.    EYE SURGERY     FLEXIBLE SIGMOIDOSCOPY N/A 08/25/2020   Procedure: FLEXIBLE SIGMOIDOSCOPY WITH PROPOFOL;  Surgeon: [name], DO;  [facility];  Service: Endoscopy;  Laterality: N/A;  7:30am   FLEXIBLE SIGMOIDOSCOPY N/A 09/30/2020   Procedure: FLEXIBLE SIGMOIDOSCOPY;  Surgeon: [name], DO;  [facility];  Service: Endoscopy;  Laterality: N/A;  am appt   JOINT REPLACEMENT     POLYPECTOMY  12/02/2019   Procedure: POLYPECTOMY;  Surgeon: Daneil Dolin, MD;  [facility];  Service: Endoscopy;;   RETINAL DETACHMENT SURGERY Left 2000s   TOTAL KNEE ARTHROPLASTY Right 07/30/2017   TOTAL KNEE ARTHROPLASTY Right 07/30/2017   Procedure: RIGHT TOTAL KNEE ARTHROPLASTY;  Surgeon: [name], MD;  Location: [place];  Service: Orthopedics;  Laterality: Right;   VASECTOMY     Social History   Occupational History   Not on file  Tobacco Use   Smoking status: Never   Smokeless tobacco: Never  Vaping Use   Vaping Use: Never used  Substance and Sexual Activity   Alcohol use: No   Drug use: No   Sexual activity: Never

## 2021-08-29 DIAGNOSIS — H338 Other retinal detachments: Secondary | ICD-10-CM | POA: Diagnosis not present

## 2021-08-29 DIAGNOSIS — R7303 Prediabetes: Secondary | ICD-10-CM | POA: Diagnosis not present

## 2021-08-29 DIAGNOSIS — K429 Umbilical hernia without obstruction or gangrene: Secondary | ICD-10-CM | POA: Diagnosis not present

## 2021-08-29 DIAGNOSIS — E782 Mixed hyperlipidemia: Secondary | ICD-10-CM | POA: Diagnosis not present

## 2021-08-29 DIAGNOSIS — M25519 Pain in unspecified shoulder: Secondary | ICD-10-CM | POA: Diagnosis not present

## 2021-08-29 DIAGNOSIS — D72829 Elevated white blood cell count, unspecified: Secondary | ICD-10-CM | POA: Diagnosis not present

## 2021-08-29 DIAGNOSIS — E1165 Type 2 diabetes mellitus with hyperglycemia: Secondary | ICD-10-CM | POA: Diagnosis not present

## 2021-08-29 DIAGNOSIS — Z96651 Presence of right artificial knee joint: Secondary | ICD-10-CM | POA: Diagnosis not present

## 2021-08-29 DIAGNOSIS — Z86010 Personal history of colonic polyps: Secondary | ICD-10-CM | POA: Diagnosis not present

## 2021-08-29 DIAGNOSIS — R945 Abnormal results of liver function studies: Secondary | ICD-10-CM | POA: Diagnosis not present

## 2021-09-21 ENCOUNTER — Other Ambulatory Visit: Payer: Self-pay

## 2021-09-21 ENCOUNTER — Ambulatory Visit (INDEPENDENT_AMBULATORY_CARE_PROVIDER_SITE_OTHER): Payer: Medicare Other | Admitting: Orthopaedic Surgery

## 2021-09-21 ENCOUNTER — Encounter: Payer: Self-pay | Admitting: Orthopaedic Surgery

## 2021-09-21 VITALS — Ht 66.0 in | Wt 242.0 lb

## 2021-09-21 DIAGNOSIS — M1712 Unilateral primary osteoarthritis, left knee: Secondary | ICD-10-CM | POA: Diagnosis not present

## 2021-09-21 DIAGNOSIS — M4316 Spondylolisthesis, lumbar region: Secondary | ICD-10-CM | POA: Diagnosis not present

## 2021-09-21 NOTE — Progress Notes (Signed)
? ?Office Visit Note ?  ?Patient: [name]           ?Date of Birth: 28-Oct-1955           ?MRN: [id number] ?Visit Date: 09/21/2021 ?             ?Requested by: Celene Squibb, MD ?[address] ?[address] ?[place],  [place] [address] ?PCP: Celene Squibb, MD ? ? ?Assessment & Plan: ?Visit Diagnoses:  ?1. Unilateral primary osteoarthritis, left knee   ?2. Spondylolisthesis, lumbar region   ? ? ?Plan: Patient can continue to work on weight loss which is helping his knee and his back.  I will check him in 6 months if he develops a flare in either area he can return earlier.  I congratulated him on his weight loss efforts.  Total knee arthroplasty on the right continues to do well. ? ?Follow-Up Instructions: Return in about 6 months (around 03/24/2022).  ? ?Orders:  ?No orders of the defined types were placed in this encounter. ? ?No orders of the defined types were placed in this encounter. ? ? ? ? Procedures: ?No procedures performed ? ? ?Clinical Data: ?No additional findings. ? ? ?Subjective: ?Chief Complaint  ?Patient presents with  ? Left Knee - Follow-up  ? Lower Back - Follow-up  ? ? ?HPI 66 year old male returns he is continue to gradually work on the weight loss and is noting some improvement in his walking speed and is feeling better.  His back pain is better he still has some knee pain.  He does have grade 1 anterolisthesis at L4-5 with lateral recess bilateral narrowing. ? ?Review of Systems 14 point system update unchanged from 08/24/2021. ? ? ?Objective: ?Vital Signs: Ht '5\' 6"'$  (1.676 m)   Wt 242 lb (109.8 kg)   BMI 39.06 kg/m?  ? ?Physical Exam ?Constitutional:   ?   Appearance: He is well-developed.  ?HENT:  ?   Head: Normocephalic and atraumatic.  ?   Right Ear: External ear normal.  ?   Left Ear: External ear normal.  ?Eyes:  ?   Pupils: Pupils are equal, round, and reactive to light.  ?Neck:  ?   Thyroid: No thyromegaly.  ?   Trachea: No tracheal deviation.  ?Cardiovascular:  ?   Rate and Rhythm: Normal  rate.  ?Pulmonary:  ?   Effort: Pulmonary effort is normal.  ?   Breath sounds: No wheezing.  ?Abdominal:  ?   General: Bowel sounds are normal.  ?   Palpations: Abdomen is soft.  ?Musculoskeletal:  ?   Cervical back: Neck supple.  ?Skin: ?   General: Skin is warm and dry.  ?   Capillary Refill: Capillary refill takes less than 2 seconds.  ?Neurological:  ?   Mental Status: He is alert and oriented to person, place, and time.  ?Psychiatric:     ?   Behavior: Behavior normal.     ?   Thought Content: Thought content normal.     ?   Judgment: Judgment normal.  ? ? ?Ortho Exam patient has minimal knee tenderness negative straight leg raising 90 degrees normal heel toe gait.  No lower extremity atrophy no rash over exposed skin. ? ?Specialty Comments:  ?No specialty comments available. ? ?Imaging: ?No results found. ? ? ?PMFS History: ?Patient Active Problem List  ? Diagnosis Date Noted  ? Unilateral primary osteoarthritis, left knee 06/15/2020  ? Degenerative disc disease, lumbar 01/13/2020  ? Spondylolisthesis, lumbar  region 01/13/2020  ? Rectal bleeding 11/06/2019  ? History of colonic polyps 11/06/2019  ? Morbid (severe) obesity due to excess calories ([place]) 10/07/2019  ? Chronic pain of right knee 10/08/2018  ? Chronic right shoulder pain 05/07/2018  ? History of total right knee replacement 07/30/2017  ? Colon cancer screening 10/28/2013  ? Umbilical hernia without mention of obstruction or gangrene 10/28/2013  ? ?Past Medical History:  ?Diagnosis Date  ? Arthritis   ? "right knee" (07/30/2017)  ? CAP (community acquired pneumonia) 2018  ? Diabetes mellitus without complication ([place])   ? Hypertension   ? Hypertension   ? Wears glasses   ?  ?Family History  ?Problem Relation Age of Onset  ? Colon cancer Neg Hx   ? Colon polyps Neg Hx   ?  ?Past Surgical History:  ?Procedure Laterality Date  ? COLONOSCOPY  2009  ? Fields: 3 small tubular adenomas removed.  ? COLONOSCOPY N/A 12/02/2019  ?  three 4-5 mm polyps  (hyperplastic and benign small bowel mucosa) in sigmoid colon at IC valve, redundant colon, non-bleeding internal hemorrhoids.   ? EYE SURGERY    ? FLEXIBLE SIGMOIDOSCOPY N/A 08/25/2020  ? Procedure: FLEXIBLE SIGMOIDOSCOPY WITH PROPOFOL;  Surgeon: [name], DO;  [facility];  Service: Endoscopy;  Laterality: N/A;  7:30am  ? FLEXIBLE SIGMOIDOSCOPY N/A 09/30/2020  ? Procedure: FLEXIBLE SIGMOIDOSCOPY;  Surgeon: [name], DO;  [facility];  Service: Endoscopy;  Laterality: N/A;  am appt  ? JOINT REPLACEMENT    ? POLYPECTOMY  12/02/2019  ? Procedure: POLYPECTOMY;  Surgeon: Daneil Dolin, MD;  [facility];  Service: Endoscopy;;  ? RETINAL DETACHMENT SURGERY Left 2000s  ? TOTAL KNEE ARTHROPLASTY Right 07/30/2017  ? TOTAL KNEE ARTHROPLASTY Right 07/30/2017  ? Procedure: RIGHT TOTAL KNEE ARTHROPLASTY;  Surgeon: [name], MD;  Location: [facility];  Service: Orthopedics;  Laterality: Right;  ? VASECTOMY    ? ?Social History  ? ?Occupational History  ? Not on file  ?Tobacco Use  ? Smoking status: Never  ? Smokeless tobacco: Never  ?Vaping Use  ? Vaping Use: Never used  ?Substance and Sexual Activity  ? Alcohol use: No  ? Drug use: No  ? Sexual activity: Never  ? ? ? ? ? ? ?

## 2021-11-15 ENCOUNTER — Encounter: Payer: Self-pay | Admitting: Orthopaedic Surgery

## 2021-11-15 ENCOUNTER — Ambulatory Visit (INDEPENDENT_AMBULATORY_CARE_PROVIDER_SITE_OTHER): Payer: Medicare Other | Admitting: Orthopaedic Surgery

## 2021-11-15 DIAGNOSIS — M1712 Unilateral primary osteoarthritis, left knee: Secondary | ICD-10-CM

## 2021-11-15 DIAGNOSIS — M25511 Pain in right shoulder: Secondary | ICD-10-CM

## 2021-11-15 DIAGNOSIS — G8929 Other chronic pain: Secondary | ICD-10-CM

## 2021-11-15 MED ORDER — BUPIVACAINE HCL 0.25 % IJ SOLN
2.0000 mL | INTRAMUSCULAR | Status: AC | PRN
  Administered 2021-11-15: 2 mL via INTRA_ARTICULAR

## 2021-11-15 NOTE — Progress Notes (Signed)
? ?Office Visit Note ?  ?Patient: [name]           ?Date of Birth: Sep 30, 1955           ?MRN: [id number] ?Visit Date: 11/15/2021 ?             ?Requested by: Celene Squibb, MD ?[address] ?[address] ?[place],  [place] [address] ?PCP: Celene Squibb, MD ? ? ?Assessment & Plan: ?Visit Diagnoses:  ?1. Unilateral primary osteoarthritis, left knee   ?2. Chronic right shoulder pain   ? ? ?Plan: Mr. Staheli has a long history of osteoarthritis of both knees.  He has had a successful right total knee replacement but continues to have recurrent problems with arthritic symptoms in his left knee.  He has had a number of cortisone injections in the past that helped and he like to have another one today as he is leaving town.  This was performed without difficulty.  Again have discussed total knee replacement with him but he is really not "white ready". ? ?The other issue is with his right shoulder.  I saw him in 2019 with evidence of impingement with negative x-rays.  He is having recurrent symptoms but tickly with overhead activity and sleeping.  He does have positive impingement and minimally positive empty can testing.  I am going to order an MRI scan as he might very easily have a rotator cuff tear ? ?Follow-Up Instructions: Return After MRI scan right shoulder.  ? ?Orders:  ?No orders of the defined types were placed in this encounter. ? ?No orders of the defined types were placed in this encounter. ? ? ? ? Procedures: ?Large Joint Inj: L knee on 11/15/2021 2:04 PM ?Indications: pain and diagnostic evaluation ?Details: 25 G 1.5 in needle, anteromedial approach ? ?Arthrogram: No ? ?Medications: 2 mL bupivacaine 0.25 % ? ?12 mg betamethasone injected with Marcaine into the medial compartment left knee without problem ?Procedure, treatment alternatives, risks and benefits explained, specific risks discussed. Consent was given by the patient. Patient was prepped and draped in the usual sterile fashion.  ? ? ? ? ?Clinical  Data: ?No additional findings. ? ? ?Subjective: ?Chief Complaint  ?Patient presents with  ? Left Knee - Pain  ? Right Shoulder - Pain  ?Patient presents today for his left knee and right shoulder. He is wanting to have both injected with cortisone because he is leaving to go out of town this week. He is diabetic.  ? ?HPI ? ?Review of Systems ? ? ?Objective: ?Vital Signs: There were no vitals taken for this visit. ? ?Physical Exam ?Constitutional:   ?   Appearance: He is well-developed.  ?Pulmonary:  ?   Effort: Pulmonary effort is normal.  ?Skin: ?   General: Skin is warm and dry.  ?Neurological:  ?   Mental Status: He is alert and oriented to person, place, and time.  ?Psychiatric:     ?   Behavior: Behavior normal.  ? ? ?Ortho Exam awake alert no x3.  Comfortable sitting.  Walks without a limp.  No ambulatory aid.  Well-healed right total knee incision without any localized areas of tenderness.  Left knee with very minimal effusion and mostly medial joint pain.  Slight varus.  She lacks just a few degrees to full extension and flexed over 100 degrees without instability. ? ?Right shoulder with positive impingement and empty can testing.  Able to place his arm over his head with only minimal discomfort.  No pain referable to the cervical spine.  Good grip and release ? ?Specialty Comments:  ?No specialty comments available. ? ?Imaging: ?No results found. ? ? ?PMFS History: ?Patient Active Problem List  ? Diagnosis Date Noted  ? Unilateral primary osteoarthritis, left knee 06/15/2020  ? Degenerative disc disease, lumbar 01/13/2020  ? Spondylolisthesis, lumbar region 01/13/2020  ? Rectal bleeding 11/06/2019  ? History of colonic polyps 11/06/2019  ? Morbid (severe) obesity due to excess calories ([place]) 10/07/2019  ? Chronic pain of right knee 10/08/2018  ? Chronic right shoulder pain 05/07/2018  ? History of total right knee replacement 07/30/2017  ? Colon cancer screening 10/28/2013  ? Umbilical hernia without  mention of obstruction or gangrene 10/28/2013  ? ?Past Medical History:  ?Diagnosis Date  ? Arthritis   ? "right knee" (07/30/2017)  ? CAP (community acquired pneumonia) 2018  ? Diabetes mellitus without complication ([place])   ? Hypertension   ? Hypertension   ? Wears glasses   ?  ?Family History  ?Problem Relation Age of Onset  ? Colon cancer Neg Hx   ? Colon polyps Neg Hx   ?  ?Past Surgical History:  ?Procedure Laterality Date  ? COLONOSCOPY  2009  ? Fields: 3 small tubular adenomas removed.  ? COLONOSCOPY N/A 12/02/2019  ?  three 4-5 mm polyps (hyperplastic and benign small bowel mucosa) in sigmoid colon at IC valve, redundant colon, non-bleeding internal hemorrhoids.   ? EYE SURGERY    ? FLEXIBLE SIGMOIDOSCOPY N/A 08/25/2020  ? Procedure: FLEXIBLE SIGMOIDOSCOPY WITH PROPOFOL;  Surgeon: [name], DO;  [facility];  Service: Endoscopy;  Laterality: N/A;  7:30am  ? FLEXIBLE SIGMOIDOSCOPY N/A 09/30/2020  ? Procedure: FLEXIBLE SIGMOIDOSCOPY;  Surgeon: [name], DO;  [facility];  Service: Endoscopy;  Laterality: N/A;  am appt  ? JOINT REPLACEMENT    ? POLYPECTOMY  12/02/2019  ? Procedure: POLYPECTOMY;  Surgeon: Daneil Dolin, MD;  [facility];  Service: Endoscopy;;  ? RETINAL DETACHMENT SURGERY Left 2000s  ? TOTAL KNEE ARTHROPLASTY Right 07/30/2017  ? TOTAL KNEE ARTHROPLASTY Right 07/30/2017  ? Procedure: RIGHT TOTAL KNEE ARTHROPLASTY;  Surgeon: [name], MD;  Location: [facility];  Service: Orthopedics;  Laterality: Right;  ? VASECTOMY    ? ?Social History  ? ?Occupational History  ? Not on file  ?Tobacco Use  ? Smoking status: Never  ? Smokeless tobacco: Never  ?Vaping Use  ? Vaping Use: Never used  ?Substance and Sexual Activity  ? Alcohol use: No  ? Drug use: No  ? Sexual activity: Never  ? ? ? ? ? ? ?

## 2021-12-01 ENCOUNTER — Ambulatory Visit (HOSPITAL_COMMUNITY): Admission: RE | Admit: 2021-12-01 | Payer: Medicare Other | Source: Ambulatory Visit

## 2021-12-05 DIAGNOSIS — E1165 Type 2 diabetes mellitus with hyperglycemia: Secondary | ICD-10-CM | POA: Diagnosis not present

## 2021-12-05 DIAGNOSIS — E782 Mixed hyperlipidemia: Secondary | ICD-10-CM | POA: Diagnosis not present

## 2021-12-12 DIAGNOSIS — E1165 Type 2 diabetes mellitus with hyperglycemia: Secondary | ICD-10-CM | POA: Diagnosis not present

## 2021-12-12 DIAGNOSIS — Z86010 Personal history of colonic polyps: Secondary | ICD-10-CM | POA: Diagnosis not present

## 2021-12-12 DIAGNOSIS — Z96651 Presence of right artificial knee joint: Secondary | ICD-10-CM | POA: Diagnosis not present

## 2021-12-12 DIAGNOSIS — H338 Other retinal detachments: Secondary | ICD-10-CM | POA: Diagnosis not present

## 2021-12-12 DIAGNOSIS — M25519 Pain in unspecified shoulder: Secondary | ICD-10-CM | POA: Diagnosis not present

## 2021-12-12 DIAGNOSIS — K429 Umbilical hernia without obstruction or gangrene: Secondary | ICD-10-CM | POA: Diagnosis not present

## 2021-12-12 DIAGNOSIS — R945 Abnormal results of liver function studies: Secondary | ICD-10-CM | POA: Diagnosis not present

## 2021-12-12 DIAGNOSIS — M545 Low back pain, unspecified: Secondary | ICD-10-CM | POA: Diagnosis not present

## 2021-12-12 DIAGNOSIS — D72829 Elevated white blood cell count, unspecified: Secondary | ICD-10-CM | POA: Diagnosis not present

## 2021-12-12 DIAGNOSIS — E782 Mixed hyperlipidemia: Secondary | ICD-10-CM | POA: Diagnosis not present

## 2021-12-13 ENCOUNTER — Ambulatory Visit (HOSPITAL_COMMUNITY)
Admission: RE | Admit: 2021-12-13 | Discharge: 2021-12-13 | Disposition: A | Discharge status: Home / Self Care | Payer: Medicare Other | Source: Ambulatory Visit | Attending: Orthopaedic Surgery | Admitting: Orthopaedic Surgery

## 2021-12-13 DIAGNOSIS — M19011 Primary osteoarthritis, right shoulder: Secondary | ICD-10-CM | POA: Diagnosis not present

## 2021-12-13 DIAGNOSIS — M25511 Pain in right shoulder: Secondary | ICD-10-CM | POA: Diagnosis not present

## 2021-12-13 DIAGNOSIS — G8929 Other chronic pain: Secondary | ICD-10-CM | POA: Diagnosis not present

## 2021-12-13 DIAGNOSIS — S46011A Strain of muscle(s) and tendon(s) of the rotator cuff of right shoulder, initial encounter: Secondary | ICD-10-CM | POA: Diagnosis not present

## 2021-12-13 IMAGING — MR MR SHOULDER*R* W/O CM
5 series · 40 of 40 positions shown · non-contrast
Comparison: None Available.

CLINICAL DATA: Shoulder pain, rotator cuff disorder suspected, xray
done

EXAM:
MRI OF THE RIGHT SHOULDER WITHOUT CONTRAST
TECHNIQUE: Multiplanar, multisequence MR imaging of the shoulder was performed.
No intravenous contrast was administered.

[Series 5: T2 fat-sat · axial · right · 4.0mm · 0.47mm/px · z∈[-35,+83]mm · 8 of 26 slices shown (1 of 3)]
[im 1/26]
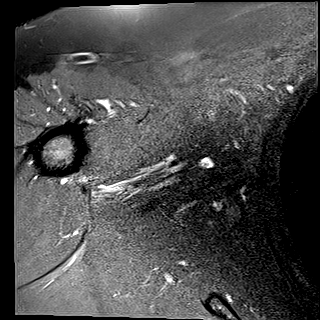
[im 4/26]
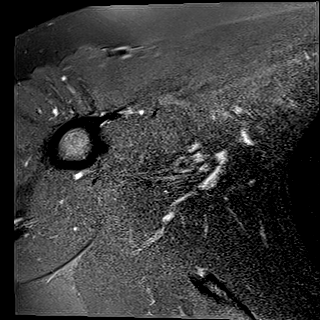
[im 8/26]
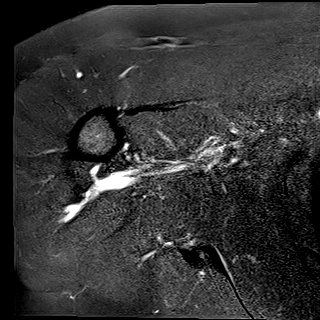
[im 11/26]
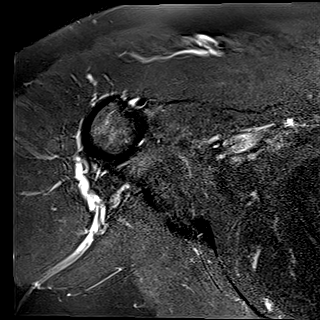
[im 15/26]
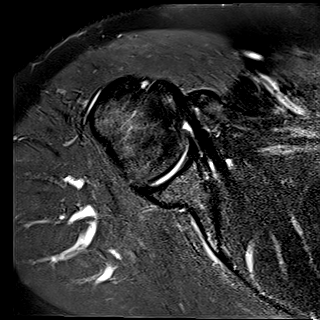
[im 18/26]
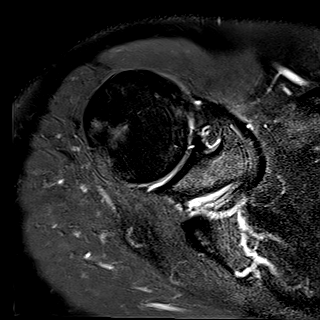
[im 22/26]
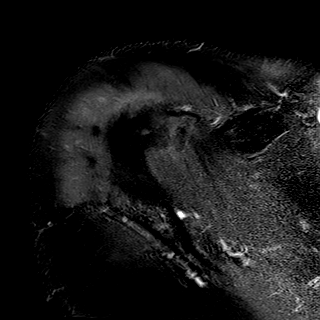
[im 26/26]
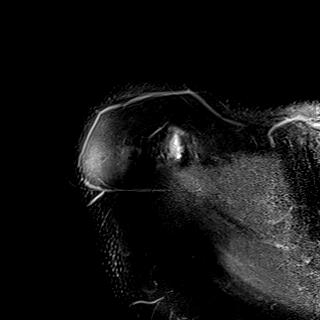

[Series 6: T2 fat-sat · sagittal · right · 4.0mm · 0.47mm/px · 8 of 26 slices shown (2 of 3)]
[im 1/26]
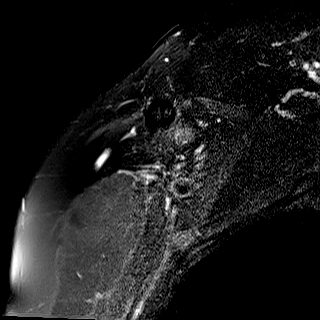
[im 4/26]
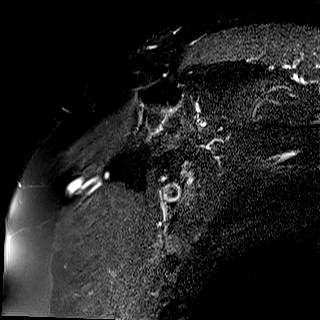
[im 8/26]
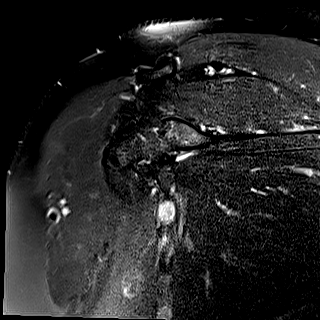
[im 11/26]
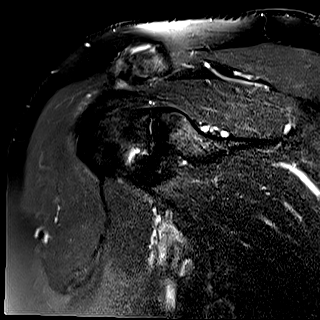
[im 15/26]
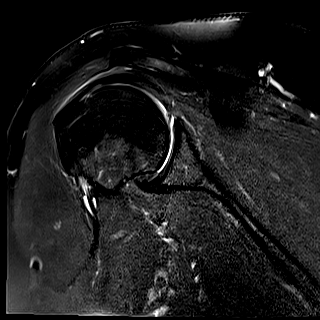
[im 18/26]
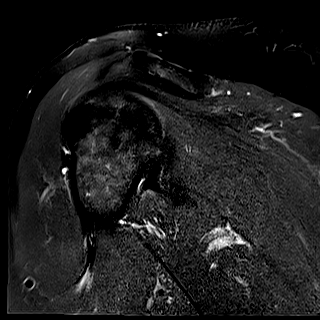
[im 22/26]
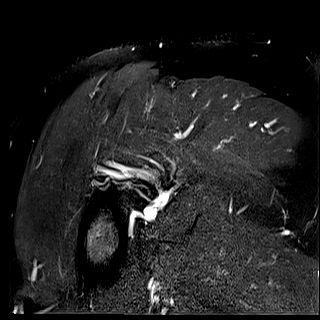
[im 26/26]
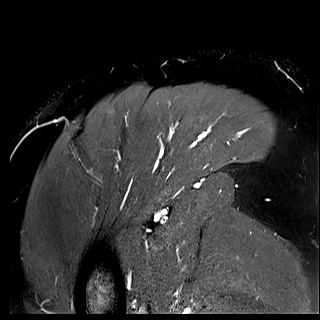

[Series 7: PD · sagittal · right · 4.0mm · 0.47mm/px · 8 of 26 slices shown]
[im 1/26]
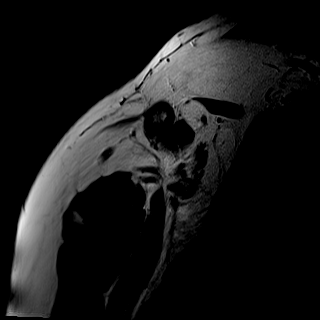
[im 4/26]
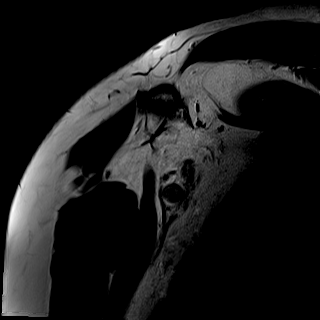
[im 8/26]
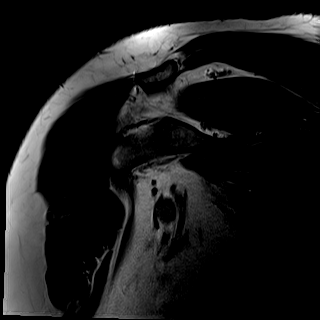
[im 11/26]
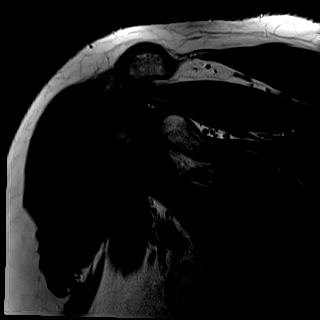
[im 15/26]
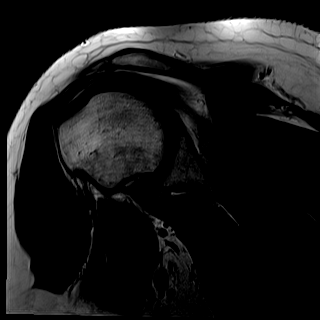
[im 18/26]
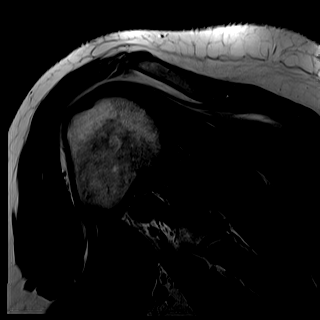
[im 22/26]
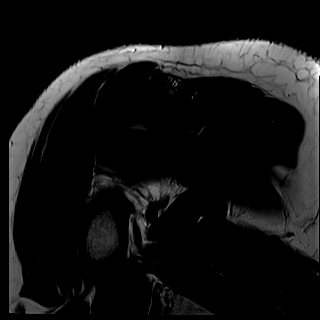
[im 26/26]
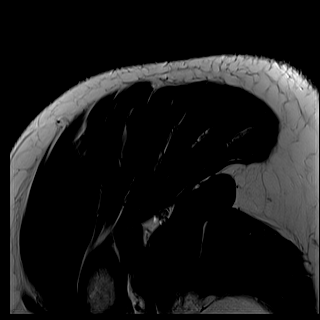

[Series 8: T2 fat-sat · coronal · right · 4.0mm · 0.44mm/px · 8 of 25 slices shown (3 of 3)]
[im 1/25]
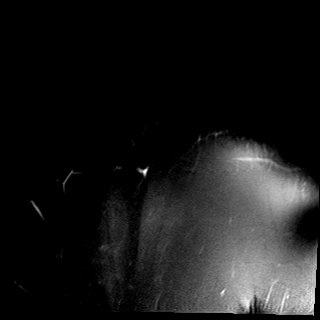
[im 4/25]
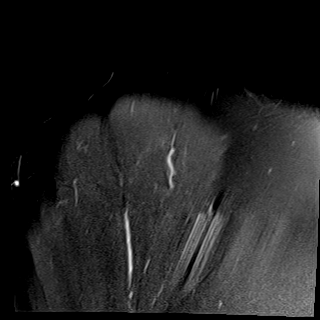
[im 7/25]
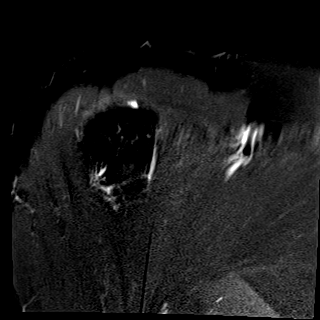
[im 11/25]
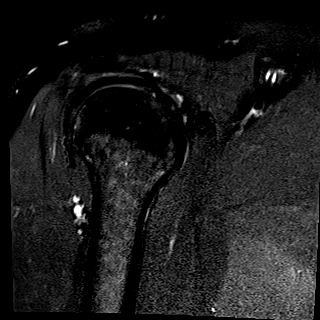
[im 14/25]
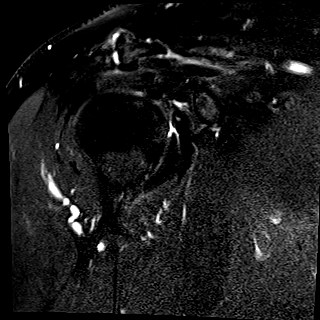
[im 18/25]
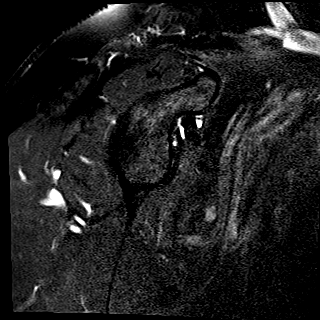
[im 21/25]
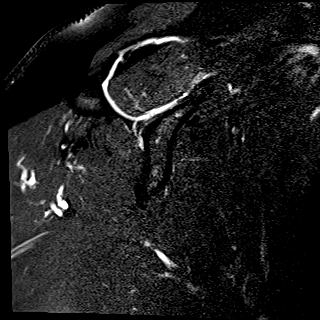
[im 25/25]
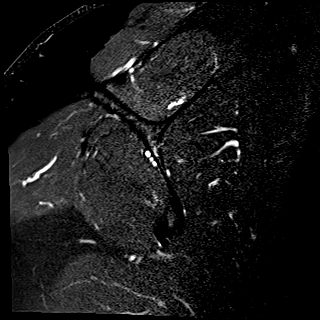

[Series 9: T1 · coronal · right · 4.0mm · 0.41mm/px · 8 of 25 slices shown]
[im 1/25]
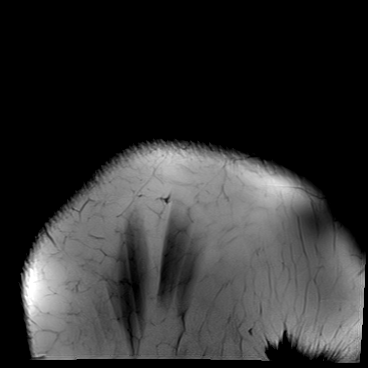
[im 4/25]
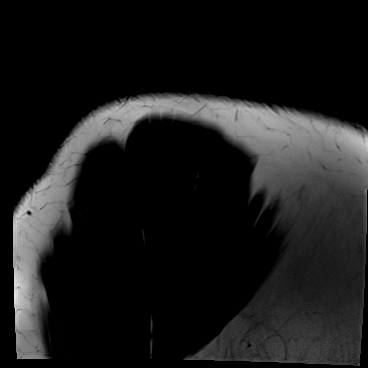
[im 7/25]
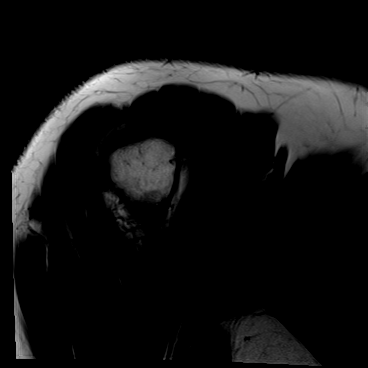
[im 11/25]
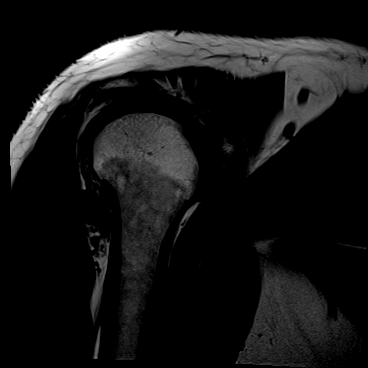
[im 14/25]
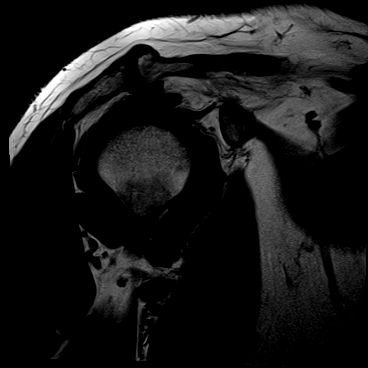
[im 18/25]
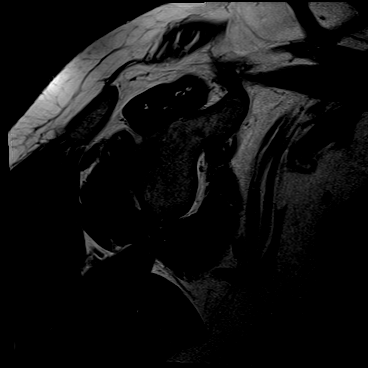
[im 21/25]
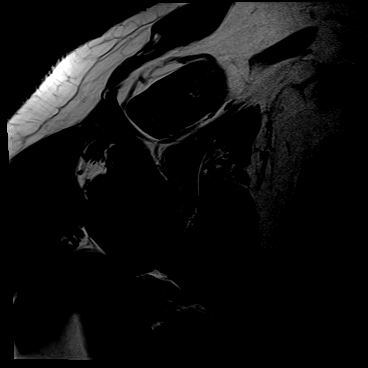
[im 25/25]
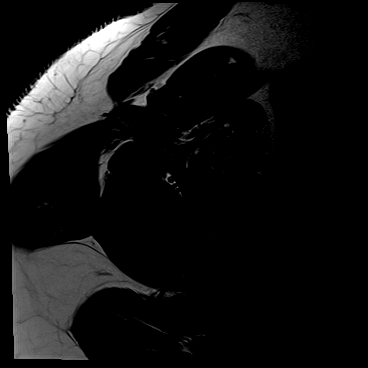

[40 of 40 positions shown; findings below may reference images not displayed]

FINDINGS: Rotator cuff: There is a partial width rim rent type tear of the
distal supraspinatus tendon at the footprint, intermediate grade,
with probable articular sided perforation along the posterior aspect
(series 6, images 12-14, sagittal T2 image 7)). There is distal
infraspinatus tendinosis with articular and bursal sided fraying.
There is mild distal subscapularis tendinosis.

Muscles: There is grade 2 atrophy of the subscapularis at the
myotendinous junction.

Biceps Long Head: Intraarticular and extraarticular portions of the
biceps tendon are intact.

Acromioclavicular Joint: Mild arthropathy of the acromioclavicular
joint. Small amount of subacromial/subdeltoid bursal fluid.

Glenohumeral Joint: Trace joint effusion.  Moderate chondrosis.

Labrum: Degenerative posterosuperior labral tearing.

Bones: No acute fracture.  No aggressive osseous lesion.

Other: No fluid collection or hematoma.
IMPRESSION: Partial width rim rent type tear of the distal supraspinatus tendon
at the footprint, intermediate grade with probable articular sided
perforation along the posterior aspect. Distal infraspinatus
tendinosis with articular and bursal sided fraying. Mild distal
subscapularis tendinosis.

Moderate glenohumeral osteoarthritis with degenerative
posterosuperior labral tearing.

Mild AC joint arthropathy.

## 2022-01-10 ENCOUNTER — Other Ambulatory Visit: Payer: Self-pay

## 2022-01-10 DIAGNOSIS — M5136 Other intervertebral disc degeneration, lumbar region: Secondary | ICD-10-CM

## 2022-01-10 DIAGNOSIS — G8929 Other chronic pain: Secondary | ICD-10-CM

## 2022-01-11 ENCOUNTER — Other Ambulatory Visit: Payer: Self-pay

## 2022-01-11 DIAGNOSIS — G8929 Other chronic pain: Secondary | ICD-10-CM

## 2022-01-22 ENCOUNTER — Encounter (HOSPITAL_COMMUNITY): Payer: Self-pay

## 2022-01-22 ENCOUNTER — Ambulatory Visit (HOSPITAL_COMMUNITY): Payer: Medicare Other | Attending: Orthopaedic Surgery

## 2022-01-22 DIAGNOSIS — G8929 Other chronic pain: Secondary | ICD-10-CM | POA: Insufficient documentation

## 2022-01-22 DIAGNOSIS — M6281 Muscle weakness (generalized): Secondary | ICD-10-CM | POA: Diagnosis not present

## 2022-01-22 DIAGNOSIS — M25511 Pain in right shoulder: Secondary | ICD-10-CM | POA: Diagnosis not present

## 2022-01-22 DIAGNOSIS — R200 Anesthesia of skin: Secondary | ICD-10-CM | POA: Insufficient documentation

## 2022-01-22 DIAGNOSIS — R29898 Other symptoms and signs involving the musculoskeletal system: Secondary | ICD-10-CM | POA: Insufficient documentation

## 2022-01-22 NOTE — Therapy (Signed)
OUTPATIENT OCCUPATIONAL THERAPY ORTHO EVALUATION  Patient Name: [name] MRN: [id number] DOB:[birth year], 66 y.o., male Today's Date: 01/22/2022  PCP: Allyn Kenner, MD REFERRING PROVIDER: Joni Fears, MD    OT End of Session - 01/22/22 1530     Visit Number 1    Number of Visits 10    Date for OT Re-Evaluation 02/22/22    Authorization Type 1)[employer] Medicare   2)Medicaid of [place]    Authorization Time Period No visit limit, no auth    OT Start Time 1117    OT Stop Time 1153    OT Time Calculation (min) 36 min    Activity Tolerance Patient tolerated treatment well    Behavior During Therapy [facility] for tasks assessed/performed             Past Medical History:  Diagnosis Date   Arthritis    "right knee" (07/30/2017)   CAP (community acquired pneumonia) 2018   Diabetes mellitus without complication ([place])    Hypertension    Hypertension    Wears glasses    Past Surgical History:  Procedure Laterality Date   COLONOSCOPY  2009   Fields: 3 small tubular adenomas removed.   COLONOSCOPY N/A 12/02/2019    three 4-5 mm polyps (hyperplastic and benign small bowel mucosa) in sigmoid colon at IC valve, redundant colon, non-bleeding internal hemorrhoids.    EYE SURGERY     FLEXIBLE SIGMOIDOSCOPY N/A 08/25/2020   Procedure: FLEXIBLE SIGMOIDOSCOPY WITH PROPOFOL;  Surgeon: [name], DO;  [facility];  Service: Endoscopy;  Laterality: N/A;  7:30am   FLEXIBLE SIGMOIDOSCOPY N/A 09/30/2020   Procedure: FLEXIBLE SIGMOIDOSCOPY;  Surgeon: [name], DO;  [facility];  Service: Endoscopy;  Laterality: N/A;  am appt   JOINT REPLACEMENT     POLYPECTOMY  12/02/2019   Procedure: POLYPECTOMY;  Surgeon: Daneil Dolin, MD;  [facility];  Service: Endoscopy;;   RETINAL DETACHMENT SURGERY Left 2000s   TOTAL KNEE ARTHROPLASTY Right 07/30/2017   TOTAL KNEE ARTHROPLASTY Right 07/30/2017   Procedure: RIGHT TOTAL KNEE ARTHROPLASTY;  Surgeon: [name], MD;  Location: [facility];  Service: Orthopedics;  Laterality: Right;   VASECTOMY     Patient Active Problem List   Diagnosis Date Noted   Unilateral primary osteoarthritis, left knee 06/15/2020   Degenerative disc disease, lumbar 01/13/2020   Spondylolisthesis, lumbar region 01/13/2020   Rectal bleeding 11/06/2019   History of colonic polyps 11/06/2019   Morbid (severe) obesity due to excess calories ([place]) 10/07/2019   Chronic pain of right knee 10/08/2018   Chronic right shoulder pain 05/07/2018   History of total right knee replacement 07/30/2017   Colon cancer screening 78/29/5621   Umbilical hernia without mention of obstruction or gangrene 10/28/2013    ONSET DATE: 2019  REFERRING DIAG: M25.511,G89.29 (ICD-10-CM) - Chronic right shoulder pain  THERAPY DIAG:  Chronic right shoulder pain  Right arm numbness  Other symptoms and signs involving the musculoskeletal system  Rationale for Evaluation and Treatment Rehabilitation  SUBJECTIVE:   SUBJECTIVE STATEMENT: "This arm has been going numb on me. I've done the therapy thing before so I hope it helps again." Pt accompanied by: self  PERTINENT HISTORY: Pt reports pain and numbness of RUE starting in 2019. He benefited from therapy but symptoms have returned and worsened. MRI showed partial tear of supraspinatus as well as infraspinatus and subscapularis tendinosis. Pt was referred for skilled OT evaluation and treatment to address RUE  pain and numbness.    PRECAUTIONS: None  WEIGHT BEARING RESTRICTIONS No  PAIN:  Are you having pain? Yes: NPRS scale: 3/10 Pain location: shoulder and upper trapezius/supraspinatus Pain description: dull ache, numbness Aggravating factors: laying on it Relieving factors: sitting up straight  FALLS: Has patient fallen in last 6 months? No  LIVING ENVIRONMENT: Lives with: lives with their spouse Lives in: House/apartment   PLOF: Independent  PATIENT GOALS "Try to get some  of the numbness out and feel a little bit better on this shoulder"    OBJECTIVE:   HAND DOMINANCE: Right  ADLs: Overall ADLs: Pt reports being independent with all ADLs and IADLs but has some increased pain and numbness with tasks. Most RUE numbness occurs when pt sleeps on his left side  Pt works part-time at [employer] as a Tourist information centre manager.   FUNCTIONAL OUTCOME MEASURES: FOTO: [id number]  UPPER EXTREMITY ROM     Active ROM Right eval  Shoulder flexion 165  Shoulder abduction 135  Shoulder internal rotation 90  Shoulder external rotation 80  (Blank rows = not tested)    UPPER EXTREMITY MMT:     MMT Right eval  Shoulder flexion 5/5  Shoulder abduction 5/5  Shoulder internal rotation 5/5  Shoulder external rotation 5/5  (Blank rows = not tested)  HAND FUNCTION: Grip strength: Right: 76 lbs; Left: 56 lbs    SENSATION: Pt reports that his primary complaint is right arm numbness when he lays on his left side with right arm supported on leg.    COGNITION: Overall cognitive status: Within functional limits for tasks assessed   OBSERVATION:    Fascial restrictions throughout RUE  TODAY'S TREATMENT:  Evaluation only    PATIENT EDUCATION: Education details: Petra Kuba of injury, role of occupational therapy, and plan of care Person educated: Patient Education method: Explanation Education comprehension: verbalized understanding   HOME EXERCISE PROGRAM: Will establish with initial treatment session  GOALS: Goals reviewed with patient? Yes  SHORT TERM GOALS: Target date: 02/22/22  Pt will be provided with and educated on HEP to decrease pain and numbness in RUE required for ADL completion.   Goal status: INITIAL  2.  Pt will decrease pain in RUE to 1/10 or less in order to reach for food and/or meal preparation items on shoulder or overhead shelves with less pain.   Goal status: INITIAL  3.  Pt will decrease numbness in RUE to 1/10 or less in order to sleep for 4+  consecutive hours without waking due to loss of sensation.   Goal status: INITIAL  4.  Pt will decrease RUE fascial restrictions to minimal amounts or less to improve mobility required for overhead reaching tasks.   Goal status: INITIAL  5.  Pt will increase P/ROM in RUE to WNL to improve ability to perform dressing tasks with minimal compensatory techniques.   Goal status: INITIAL     ASSESSMENT:  CLINICAL IMPRESSION: Patient is a 66 y.o. male who was seen today for occupational therapy evaluation for RUE pain and numbness. He presents with ROM WFL, functional strength, increased pain, and fascial restrictions throughout RUE.   PERFORMANCE DEFICITS in functional skills including ADLs, IADLs, pain, and UE functional use.  IMPAIRMENTS are limiting patient from ADLs, IADLs, rest and sleep, and leisure.   COMORBIDITIES has no other co-morbidities that affects occupational performance. Patient will benefit from skilled OT to address above impairments and improve overall function.  MODIFICATION OR ASSISTANCE TO COMPLETE EVALUATION: No modification of tasks or assist  necessary to complete an evaluation.  OT OCCUPATIONAL PROFILE AND HISTORY: Problem focused assessment: Including review of records relating to presenting problem.  CLINICAL DECISION MAKING: LOW - limited treatment options, no task modification necessary  REHAB POTENTIAL: Good  EVALUATION COMPLEXITY: Low      PLAN: OT FREQUENCY: 2x/week  OT DURATION: 4 weeks  PLANNED INTERVENTIONS: self care/ADL training, therapeutic exercise, therapeutic activity, scar mobilization, electrical stimulation, ultrasound, moist heat, and DME and/or AE instructions  CONSULTED AND AGREED WITH PLAN OF CARE: Patient  PLAN FOR NEXT SESSION: Trigger point and manual for numbness, shoulder strengthening for rotator cuff   Flonnie Hailstone, OTD, OTR/L [phone]  01/22/2022, 4:15 PM

## 2022-01-25 ENCOUNTER — Encounter (HOSPITAL_COMMUNITY): Payer: Self-pay

## 2022-01-25 ENCOUNTER — Ambulatory Visit (HOSPITAL_COMMUNITY): Payer: Medicare Other

## 2022-01-25 DIAGNOSIS — M6281 Muscle weakness (generalized): Secondary | ICD-10-CM

## 2022-01-25 DIAGNOSIS — G8929 Other chronic pain: Secondary | ICD-10-CM | POA: Diagnosis not present

## 2022-01-25 DIAGNOSIS — M25511 Pain in right shoulder: Secondary | ICD-10-CM | POA: Diagnosis not present

## 2022-01-25 DIAGNOSIS — R200 Anesthesia of skin: Secondary | ICD-10-CM | POA: Diagnosis not present

## 2022-01-25 DIAGNOSIS — R29898 Other symptoms and signs involving the musculoskeletal system: Secondary | ICD-10-CM | POA: Diagnosis not present

## 2022-01-25 NOTE — Patient Instructions (Signed)
1) (Home) Extension: Isometric / Bilateral Arm Retraction - Sitting   Facing anchor, hold hands and elbow at shoulder height, with elbow bent.  Pull arms back to squeeze shoulder blades together. Repeat 10-15 times. 1-3 times/day.   2) (Clinic) Extension / Flexion (Assist)   Face anchor, pull arms back, keeping elbow straight, and squeze shoulder blades together. Repeat 10-15 times. 1-3 times/day.   Copyright  VHI. All rights reserved.   3) (Home) Retraction: Row - Bilateral (Anchor)   Facing anchor, arms reaching forward, pull hands toward stomach, keeping elbows bent and at your sides and pinching shoulder blades together. Repeat 10-15 times. 1-3 times/day.   Copyright  VHI. All rights reserved.        Theraband strengthening: Complete 10-15X, 1-2X/day  1) Shoulder flexion  While standing with back to the door, holding Theraband at hand level, raise arm in front of you.  Keep elbow straight through entire movement.      2) Shoulder abduction  While holding an elastic band at your side, draw up your arm to the side keeping your elbow straight.

## 2022-01-25 NOTE — Therapy (Signed)
OUTPATIENT OCCUPATIONAL THERAPY TREATMENT NOTE   Patient Name: [name] MRN: [id number] DOB:1955/08/11, 66 y.o., male Today's Date: 01/25/2022  PCP: Allyn Kenner, MD REFERRING PROVIDER: Joni Fears, MD    OT End of Session - 01/25/22 [phone]     Visit Number 2    Number of Visits 10    Date for OT Re-Evaluation 02/22/22    Authorization Type 1)UHC Medicare   2)Medicaid of [place]    Authorization Time Period No visit limit, no auth    OT Start Time [phone]    OT Stop Time 1030    OT Time Calculation (min) 43 min    Activity Tolerance Patient tolerated treatment well    Behavior During Therapy [facility] for tasks assessed/performed             Past Medical History:  Diagnosis Date   Arthritis    "right knee" (07/30/2017)   CAP (community acquired pneumonia) 2018   Diabetes mellitus without complication ([place])    Hypertension    Hypertension    Wears glasses    Past Surgical History:  Procedure Laterality Date   COLONOSCOPY  2009   Fields: 3 small tubular adenomas removed.   COLONOSCOPY N/A 12/02/2019    three 4-5 mm polyps (hyperplastic and benign small bowel mucosa) in sigmoid colon at IC valve, redundant colon, non-bleeding internal hemorrhoids.    EYE SURGERY     FLEXIBLE SIGMOIDOSCOPY N/A 08/25/2020   Procedure: FLEXIBLE SIGMOIDOSCOPY WITH PROPOFOL;  Surgeon: [name], DO;  [facility];  Service: Endoscopy;  Laterality: N/A;  7:30am   FLEXIBLE SIGMOIDOSCOPY N/A 09/30/2020   Procedure: FLEXIBLE SIGMOIDOSCOPY;  Surgeon: [name], DO;  [facility];  Service: Endoscopy;  Laterality: N/A;  am appt   JOINT REPLACEMENT     POLYPECTOMY  12/02/2019   Procedure: POLYPECTOMY;  Surgeon: Daneil Dolin, MD;  [facility];  Service: Endoscopy;;   RETINAL DETACHMENT SURGERY Left 2000s   TOTAL KNEE ARTHROPLASTY Right 07/30/2017   TOTAL KNEE ARTHROPLASTY Right 07/30/2017   Procedure: RIGHT TOTAL KNEE ARTHROPLASTY;  Surgeon: [name], MD;  Location: [place];  Service: Orthopedics;  Laterality: Right;   VASECTOMY     Patient Active Problem List   Diagnosis Date Noted   Unilateral primary osteoarthritis, left knee 06/15/2020   Degenerative disc disease, lumbar 01/13/2020   Spondylolisthesis, lumbar region 01/13/2020   Rectal bleeding 11/06/2019   History of colonic polyps 11/06/2019   Morbid (severe) obesity due to excess calories ([place]) 10/07/2019   Chronic pain of right knee 10/08/2018   Chronic right shoulder pain 05/07/2018   History of total right knee replacement 07/30/2017   Colon cancer screening 02/12/4817   Umbilical hernia without mention of obstruction or gangrene 10/28/2013    ONSET DATE: 2019  REFERRING DIAG: M25.511,G89.29 (ICD-10-CM) - Chronic right shoulder pain  THERAPY DIAG:  Chronic right shoulder pain  Right arm numbness  Other symptoms and signs involving the musculoskeletal system  Muscle weakness (generalized)  Rationale for Evaluation and Treatment Rehabilitation  PERTINENT HISTORY: Pt reports pain and numbness of RUE starting in 2019. He benefited from therapy but symptoms have returned and worsened. MRI showed partial tear of supraspinatus as well as infraspinatus and subscapularis tendinosis. Pt was referred for skilled OT evaluation and treatment to address RUE pain and numbness.   PRECAUTIONS: none  SUBJECTIVE: S: "It has been feeling good. I got some weights to use at home."  PAIN:  Are you having pain? No   OBJECTIVE:   HAND DOMINANCE: Right   FUNCTIONAL OUTCOME MEASURES: FOTO: [id number]   UPPER EXTREMITY ROM      Active ROM Right eval  Shoulder flexion 165  Shoulder abduction 135  Shoulder internal rotation 90  Shoulder external rotation 80  (Blank rows = not tested)   UPPER EXTREMITY MMT:      MMT Right eval  Shoulder flexion 5/5  Shoulder abduction 5/5  Shoulder internal rotation 5/5  Shoulder external rotation 5/5  (Blank rows = not tested)    HAND FUNCTION: Grip strength: Right: 76 lbs; Left: 56 lbs   GOALS: Goals reviewed with patient? Yes   SHORT TERM GOALS: Target date: 02/22/22   Pt will be provided with and educated on HEP to decrease pain and numbness in RUE required for ADL completion.    Goal status: INITIAL   2.  Pt will decrease pain in RUE to 1/10 or less in order to reach for food and/or meal preparation items on shoulder or overhead shelves with less pain.    Goal status: INITIAL   3.  Pt will decrease numbness in RUE to 1/10 or less in order to sleep for 4+ consecutive hours without waking due to loss of sensation.    Goal status: INITIAL   4.  Pt will decrease RUE fascial restrictions to minimal amounts or less to improve mobility required for overhead reaching tasks.    Goal status: INITIAL   5.  Pt will increase P/ROM in RUE to WNL to improve ability to perform dressing tasks with minimal compensatory techniques.    Goal status: INITIAL    TODAY'S TREATMENT:  7/27 -Manual: Myofascial release, trigger point and scraping completed to upper trapezius and lateral pectoralis, to decrease fascial restriction and pain and increase ROM. -Shoulder Strengthening:   -1x10 shoulder flexion and abduction, 3#  -1x10 IR/er, standing, 4#  -1x10 bent over row, 3 second hold with shoulder retraction, 4#    -1x10 wall angels  -Scapular Strengthening:   -1x10 row, extension, and retraction, red theraband   -UBE arm bike, level 3, 3 minutes forward, level 5, 2 minute reverse   PATIENT EDUCATION: Education details: Theraband scapular and shoulder strengthening Person educated: Patient Education method: Explanation, Demonstration, and Handouts Education comprehension: verbalized understanding and returned demonstration   HOME EXERCISE PROGRAM 7/27: Red Theraband Scapular strengthening (row, retraction, extension) and shoulder strengthening (forward flexion and abduction)     ASSESSMENT:   CLINICAL  IMPRESSION: A: Pt reports no numbness in his right hand since he was seen for the evaluation. Muscles restrictions through upper trapezius addressed with trigger point and scraping, noticeable release allowing for less discomfort with range of motion. Pt requiring moderate to maximum verbal and tactile cues for positioning, form, and muscle recruitment with shoulder exercises today, improving with final repetitions. Pt reported no pain with exercises but fatigue.    PLAN:   OT FREQUENCY: 2x/week  OT DURATION: 4 weeks  PLANNED INTERVENTIONS:self care/ADL training, therapeutic exercise, therapeutic activity, scar mobilization, electrical stimulation, ultrasound, moist heat, and DME and/or AE instructions  CONSULTED AND AGREED WITH PLAN OF CARE: Patient  PLAN FOR NEXT SESSION: P: Continue with manual as needed, progress shoulder strengthening       Mathews Robinsons, OTR/L [phone]  01/25/2022, 11:06 AM

## 2022-01-30 ENCOUNTER — Ambulatory Visit (HOSPITAL_COMMUNITY): Payer: Medicare Other | Attending: Orthopaedic Surgery

## 2022-01-30 ENCOUNTER — Encounter (HOSPITAL_COMMUNITY): Payer: Self-pay

## 2022-01-30 DIAGNOSIS — R29898 Other symptoms and signs involving the musculoskeletal system: Secondary | ICD-10-CM | POA: Insufficient documentation

## 2022-01-30 DIAGNOSIS — R200 Anesthesia of skin: Secondary | ICD-10-CM | POA: Insufficient documentation

## 2022-01-30 DIAGNOSIS — G8929 Other chronic pain: Secondary | ICD-10-CM | POA: Insufficient documentation

## 2022-01-30 DIAGNOSIS — M25511 Pain in right shoulder: Secondary | ICD-10-CM | POA: Diagnosis not present

## 2022-01-30 NOTE — Therapy (Signed)
OUTPATIENT OCCUPATIONAL THERAPY TREATMENT NOTE   Patient Name: [name] MRN: [id number] DOB:[birth year], 66 y.o., male Today's Date: 01/30/2022  PCP: Allyn Kenner, MD REFERRING PROVIDER: Joni Fears, MD    OT End of Session - 01/30/22 [phone]     Visit Number 3    Number of Visits 10    Date for OT Re-Evaluation 02/22/22    Authorization Type 1)UHC Medicare   2)Medicaid of [place]    Authorization Time Period No visit limit, no auth    OT Start Time [phone]    OT Stop Time 1029    OT Time Calculation (min) 39 min    Activity Tolerance Patient tolerated treatment well    Behavior During Therapy [facility] for tasks assessed/performed             Past Medical History:  Diagnosis Date   Arthritis    "right knee" (07/30/2017)   CAP (community acquired pneumonia) 2018   Diabetes mellitus without complication ([place])    Hypertension    Hypertension    Wears glasses    Past Surgical History:  Procedure Laterality Date   COLONOSCOPY  2009   Fields: 3 small tubular adenomas removed.   COLONOSCOPY N/A 12/02/2019    three 4-5 mm polyps (hyperplastic and benign small bowel mucosa) in sigmoid colon at IC valve, redundant colon, non-bleeding internal hemorrhoids.    EYE SURGERY     FLEXIBLE SIGMOIDOSCOPY N/A 08/25/2020   Procedure: FLEXIBLE SIGMOIDOSCOPY WITH PROPOFOL;  Surgeon: [name], DO;  [facility];  Service: Endoscopy;  Laterality: N/A;  7:30am   FLEXIBLE SIGMOIDOSCOPY N/A 09/30/2020   Procedure: FLEXIBLE SIGMOIDOSCOPY;  Surgeon: [name], DO;  [facility];  Service: Endoscopy;  Laterality: N/A;  am appt   JOINT REPLACEMENT     POLYPECTOMY  12/02/2019   Procedure: POLYPECTOMY;  Surgeon: Daneil Dolin, MD;  [facility];  Service: Endoscopy;;   RETINAL DETACHMENT SURGERY Left 2000s   TOTAL KNEE ARTHROPLASTY Right 07/30/2017   TOTAL KNEE ARTHROPLASTY Right 07/30/2017   Procedure: RIGHT TOTAL KNEE ARTHROPLASTY;  Surgeon: [name], MD;  Location: [place];  Service: Orthopedics;  Laterality: Right;   VASECTOMY     Patient Active Problem List   Diagnosis Date Noted   Unilateral primary osteoarthritis, left knee 06/15/2020   Degenerative disc disease, lumbar 01/13/2020   Spondylolisthesis, lumbar region 01/13/2020   Rectal bleeding 11/06/2019   History of colonic polyps 11/06/2019   Morbid (severe) obesity due to excess calories ([place]) 10/07/2019   Chronic pain of right knee 10/08/2018   Chronic right shoulder pain 05/07/2018   History of total right knee replacement 07/30/2017   Colon cancer screening 55/73/2202   Umbilical hernia without mention of obstruction or gangrene 10/28/2013    ONSET DATE: 2019  REFERRING DIAG: M25.511,G89.29 (ICD-10-CM) - Chronic right shoulder pain  THERAPY DIAG:  Chronic right shoulder pain  Right arm numbness  Other symptoms and signs involving the musculoskeletal system  Rationale for Evaluation and Treatment Rehabilitation  PERTINENT HISTORY: Pt reports pain and numbness of RUE starting in 2019. He benefited from therapy but symptoms have returned and worsened. MRI showed partial tear of supraspinatus as well as infraspinatus and subscapularis tendinosis. Pt was referred for skilled OT evaluation and treatment to address RUE pain and numbness.   PRECAUTIONS: none  SUBJECTIVE: S: "I think the exercises are doing well for me."  PAIN:  Are you having pain? No  OBJECTIVE:   HAND DOMINANCE: Right   FUNCTIONAL OUTCOME MEASURES: FOTO: [phone]   UPPER EXTREMITY ROM      Active ROM Right eval  Shoulder flexion 165  Shoulder abduction 135  Shoulder internal rotation 90  Shoulder external rotation 80  (Blank rows = not tested)   UPPER EXTREMITY MMT:      MMT Right eval  Shoulder flexion 5/5  Shoulder abduction 5/5  Shoulder internal rotation 5/5  Shoulder external rotation 5/5  (Blank rows = not tested)   HAND FUNCTION: Grip strength: Right: 76 lbs; Left:  56 lbs   GOALS: Goals reviewed with patient? Yes   SHORT TERM GOALS: Target date: 02/22/22   Pt will be provided with and educated on HEP to decrease pain and numbness in RUE required for ADL completion.    Goal status: ongoing   2.  Pt will decrease pain in RUE to 1/10 or less in order to reach for food and/or meal preparation items on shoulder or overhead shelves with less pain.    Goal status: ongoing   3.  Pt will decrease numbness in RUE to 1/10 or less in order to sleep for 4+ consecutive hours without waking due to loss of sensation.    Goal status: ongoing   4.  Pt will decrease RUE fascial restrictions to minimal amounts or less to improve mobility required for overhead reaching tasks.    Goal status: ongoing   5.  Pt will increase P/ROM in RUE to WNL to improve ability to perform dressing tasks with minimal compensatory techniques.    Goal status: ongoing    TODAY'S TREATMENT:   8/1 -Manual: Myofascial release, trigger point completed to upper trapezius and lateral pectoralis, to decrease fascial restriction and pain and increase ROM. -Tennis ball self soft tissue mobilization -Shoulder Strengthening:   -1x10 shoulder flexion, 45 deg, and abduction, 4#  -1x10 bent over row, 3 second hold with shoulder retraction, 8#    -1x10 IR/er, standing, 8# -Body Blade  -1x45 sec - arm at side elbow flexed and elbow extended  -1x45 sec - shoulder flexion with elbow extended -Scapular Strengthening:   -1x10 row, extension, and retraction, green theraband   -UBE arm bike, level 4, 3.5 minutes forward, level 5, 2 minute reverse   7/27 -Manual: Myofascial release, trigger point and scraping completed to upper trapezius and lateral pectoralis, to decrease fascial restriction and pain and increase ROM. -Shoulder Strengthening:   -1x10 shoulder flexion and abduction, 3#  -1x10 IR/er, standing, 4#  -1x10 bent over row, 3 second hold with shoulder retraction, 4#    -1x10 wall  angels  -Scapular Strengthening:   -1x10 row, extension, and retraction, red theraband   -UBE arm bike, level 3, 3 minutes forward, level 5, 2 minute reverse   PATIENT EDUCATION: Education details: Tennis ball self soft tissue mobilization Person educated: Patient Education method: Consulting civil engineer, Demonstration, and Handouts Education comprehension: verbalized understanding and returned demonstration   HOME EXERCISE PROGRAM 7/27: Red Theraband Scapular strengthening (row, retraction, extension) and shoulder strengthening (forward flexion and abduction)  8/1: Tennis ball self soft tissue mobilization    ASSESSMENT:   CLINICAL IMPRESSION: A: Pt stated that he had some increased numbness while sleeping in the past week and required tylenol for some increased pain yesterday. Upper trapezius with significantly less restriction today, although still tight and addressed with manual to decrease numbness. Continues with good ROM although required maximum verbal and tactile cues for form and to decrease compensations. Discussed  the importance of prioritizing form before increasing weight.     PLAN:   OT FREQUENCY: 2x/week  OT DURATION: 4 weeks  PLANNED INTERVENTIONS:self care/ADL training, therapeutic exercise, therapeutic activity, scar mobilization, electrical stimulation, ultrasound, moist heat, and DME and/or AE instructions  CONSULTED AND AGREED WITH PLAN OF CARE: Patient  PLAN FOR NEXT SESSION: P: Continue with manual as needed, progress shoulder strengthening, ball on wall, lat pulldowns, arnold press, overhead weighted walk, stretches, try theraband exercises in front of mirror for correct form.     Flonnie Hailstone, [place], OTR/L [phone]  01/30/2022, 11:39 AM

## 2022-01-31 ENCOUNTER — Telehealth: Payer: Self-pay | Admitting: Orthopaedic Surgery

## 2022-01-31 NOTE — Telephone Encounter (Signed)
Called patient got recording mailbox is full   could not leave a message

## 2022-02-01 ENCOUNTER — Encounter (HOSPITAL_COMMUNITY): Payer: Self-pay

## 2022-02-01 ENCOUNTER — Ambulatory Visit (HOSPITAL_COMMUNITY): Payer: Medicare Other

## 2022-02-01 DIAGNOSIS — G8929 Other chronic pain: Secondary | ICD-10-CM | POA: Diagnosis not present

## 2022-02-01 DIAGNOSIS — M25511 Pain in right shoulder: Secondary | ICD-10-CM | POA: Diagnosis not present

## 2022-02-01 DIAGNOSIS — R29898 Other symptoms and signs involving the musculoskeletal system: Secondary | ICD-10-CM | POA: Diagnosis not present

## 2022-02-01 DIAGNOSIS — R200 Anesthesia of skin: Secondary | ICD-10-CM

## 2022-02-01 NOTE — Therapy (Signed)
OUTPATIENT OCCUPATIONAL THERAPY TREATMENT NOTE and Discharge   Patient Name: [name] MRN: [id number] DOB:1956/05/04, 66 y.o., male Today's Date: 02/01/2022  PCP: Allyn Kenner, MD REFERRING PROVIDER: Joni Fears, MD   OCCUPATIONAL THERAPY DISCHARGE SUMMARY  Visits from Start of Care: 4  Current functional level related to goals / functional outcomes: Pt has met 5 of 5 goals. He demonstrates right shoulder ROM and strength WNL, no pain and minimal RUE numbness. He reports being able to complete all ADLs and IADLs with no restriction due to pain, strength, or ROM.    Remaining deficits: Pt reports no remaining deficits.   Education / Equipment: Established HEP. Education on tennis ball and massage gun for muscle tightness.   Plan: Patient agrees to discharge.  Patient is being discharged due to meeting the stated rehab goals.        OT End of Session - 02/01/22 0948     Visit Number 4    Number of Visits 10    Date for OT Re-Evaluation 02/22/22    Authorization Type 1)UHC Medicare   2)Medicaid of [place]    Authorization Time Period No visit limit, no auth    OT Start Time [phone]    OT Stop Time 1026    OT Time Calculation (min) 40 min    Activity Tolerance Patient tolerated treatment well    Behavior During Therapy [facility] for tasks assessed/performed             Past Medical History:  Diagnosis Date   Arthritis    "right knee" (07/30/2017)   CAP (community acquired pneumonia) 2018   Diabetes mellitus without complication ([place])    Hypertension    Hypertension    Wears glasses    Past Surgical History:  Procedure Laterality Date   COLONOSCOPY  2009   Fields: 3 small tubular adenomas removed.   COLONOSCOPY N/A 12/02/2019    three 4-5 mm polyps (hyperplastic and benign small bowel mucosa) in sigmoid colon at IC valve, redundant colon, non-bleeding internal hemorrhoids.    EYE SURGERY     FLEXIBLE SIGMOIDOSCOPY N/A 08/25/2020   Procedure: FLEXIBLE SIGMOIDOSCOPY WITH  PROPOFOL;  Surgeon: [name], DO;  [facility];  Service: Endoscopy;  Laterality: N/A;  7:30am   FLEXIBLE SIGMOIDOSCOPY N/A 09/30/2020   Procedure: FLEXIBLE SIGMOIDOSCOPY;  Surgeon: [name], DO;  [facility];  Service: Endoscopy;  Laterality: N/A;  am appt   JOINT REPLACEMENT     POLYPECTOMY  12/02/2019   Procedure: POLYPECTOMY;  Surgeon: Daneil Dolin, MD;  [facility];  Service: Endoscopy;;   RETINAL DETACHMENT SURGERY Left 2000s   TOTAL KNEE ARTHROPLASTY Right 07/30/2017   TOTAL KNEE ARTHROPLASTY Right 07/30/2017   Procedure: RIGHT TOTAL KNEE ARTHROPLASTY;  Surgeon: [name], MD;  Location: [place];  Service: Orthopedics;  Laterality: Right;   VASECTOMY     Patient Active Problem List   Diagnosis Date Noted   Unilateral primary osteoarthritis, left knee 06/15/2020   Degenerative disc disease, lumbar 01/13/2020   Spondylolisthesis, lumbar region 01/13/2020   Rectal bleeding 11/06/2019   History of colonic polyps 11/06/2019   Morbid (severe) obesity due to excess calories ([place]) 10/07/2019   Chronic pain of right knee 10/08/2018   Chronic right shoulder pain 05/07/2018   History of total right knee replacement 07/30/2017   Colon cancer screening 04/25/8526   Umbilical hernia without mention of obstruction or gangrene 10/28/2013    ONSET DATE: 2019  REFERRING DIAG: M25.511,G89.29 (ICD-10-CM) - Chronic right shoulder pain  THERAPY DIAG:  Chronic right shoulder pain  Right arm numbness  Other symptoms and signs involving the musculoskeletal system  Rationale for Evaluation and Treatment Rehabilitation  PERTINENT HISTORY: Pt reports pain and numbness of RUE starting in 2019. He benefited from therapy but symptoms have returned and worsened. MRI showed partial tear of supraspinatus as well as infraspinatus and subscapularis tendinosis. Pt was referred for skilled OT evaluation and treatment to address RUE pain and  numbness.   PRECAUTIONS: none  SUBJECTIVE: S: "I feel good. My wife and I are have been exercising and I feel stronger."  PAIN:  Are you having pain? No   OBJECTIVE:   HAND DOMINANCE: Right   FUNCTIONAL OUTCOME MEASURES: FOTO: [id number] FOTO 8/3: 92   UPPER EXTREMITY ROM      Active ROM Right eval Right 8/3  Shoulder flexion 165 180  Shoulder abduction 135 175  Shoulder internal rotation 90 90  Shoulder external rotation 80 90  (Blank rows = not tested)   UPPER EXTREMITY MMT:      MMT Right eval Right 8/3  Shoulder flexion 5/5 5/5  Shoulder abduction 5/5 5/5  Shoulder internal rotation 5/5 5/5  Shoulder external rotation 5/5 5/5  (Blank rows = not tested)   HAND FUNCTION: Grip strength: Right: 76 lbs; Left: 56 lbs  8/3: Right 77 lbs GOALS: Goals reviewed with patient? Yes   SHORT TERM GOALS: Target date: 03/25/22   Pt will be provided with and educated on HEP to decrease pain and numbness in RUE required for ADL completion.    Goal status: MET   2.  Pt will decrease pain in RUE to 1/10 or less in order to reach for food and/or meal preparation items on shoulder or overhead shelves with less pain.    Goal status: MET   3.  Pt will decrease numbness in RUE to 1/10 or less in order to sleep for 4+ consecutive hours without waking due to loss of sensation.    Goal status: MET   4.  Pt will decrease RUE fascial restrictions to minimal amounts or less to improve mobility required for overhead reaching tasks.    Goal status: MET   5.  Pt will increase P/ROM in RUE to WNL to improve ability to perform dressing tasks with minimal compensatory techniques.    Goal status: MET    TODAY'S TREATMENT:   8/3 -Manual Therapy: massage gun to complete soft tissue mobilization and instruction on where to purchase and how to use to decrease fascial restrictions   -Measurements taken for discharge  -Strengthening  -Shoulder press 8#, 1x10  -Overhead carry, 4#,  walking length of hallway x2  -1x10 IR/er, standing, 8#, 1x10  -Bicep curls, 8#, 1x10  -1x10 row, extension, and retraction, green theraband  -UBE arm bike, level 4, 3.5 minutes forward, level 5, 2 minute reverse  8/1 -Manual: Myofascial release, trigger point completed to upper trapezius and lateral pectoralis, to decrease fascial restriction and pain and increase ROM. -Tennis ball self soft tissue mobilization -Shoulder Strengthening:   -1x10 shoulder flexion, 45 deg, and abduction, 4#  -1x10 bent over row, 3 second hold with shoulder retraction, 8#    -1x10 IR/er, standing, 8# -Body Blade  -1x45 sec - arm at side elbow flexed and elbow extended  -1x45 sec - shoulder flexion with elbow extended -Scapular Strengthening:   -1x10 row, extension, and retraction, green theraband   -UBE arm bike,  level 4, 3.5 minutes forward, level 5, 2 minute reverse   7/27 -Manual: Myofascial release, trigger point and scraping completed to upper trapezius and lateral pectoralis, to decrease fascial restriction and pain and increase ROM. -Shoulder Strengthening:   -1x10 shoulder flexion and abduction, 3#  -1x10 IR/er, standing, 4#  -1x10 bent over row, 3 second hold with shoulder retraction, 4#    -1x10 wall angels  -Scapular Strengthening:   -1x10 row, extension, and retraction, red theraband   -UBE arm bike, level 3, 3 minutes forward, level 5, 2 minute reverse   PATIENT EDUCATION: Education details: Massage gun soft tissue self mobilization Person educated: Patient Education method: Explanation, Demonstration, and Handouts Education comprehension: verbalized understanding and returned demonstration   HOME EXERCISE PROGRAM 7/27: Red Theraband Scapular strengthening (row, retraction, extension) and shoulder strengthening (forward flexion and abduction)  8/1: Tennis ball self soft tissue mobilization 8/3: massage gun soft tissue self mobilization    ASSESSMENT:   CLINICAL IMPRESSION: A:  Pt presents today with right shoulder ROM and strength WNL and with no reports of pain or numbness in the RUE. He was able to demonstrate good form with HEP exercises and a good understanding of what he can do at home to continue to rehab his shoulder. He reported some upper trapezius tightness at start of session, addressed with use of massage gun and educated pt on where to purchase one for use at home with assistance from his wife. Pt agreeable and enthusiastic stating, "that really works well." Minimal verbal cues for form with exercises today, although pt with improved form from last session. Pt is appropriate for discharge with HEP.     PLAN:   OT FREQUENCY: DISCHARGE   OT DURATION: DISCHARGE   CONSULTED AND AGREED WITH PLAN OF CARE: Patient  PLAN FOR NEXT SESSION: P: NA, Discharge      Mathews Robinsons, OTR/L [phone]  02/01/2022, 11:53 AM

## 2022-02-02 ENCOUNTER — Telehealth: Payer: Self-pay | Admitting: Orthopaedic Surgery

## 2022-02-02 NOTE — Telephone Encounter (Signed)
Patient request a call back regarding the release from physical therapy on 02/01/22. Pt states he was told by physical therapy that they was releasing him and that Dr Durward Fortes would receive paperwork from PT to complete and send over to his employer [employer]). Patient requesting a copy of the paperwork and a call from Dr Tanda Rockers office.

## 2022-02-06 ENCOUNTER — Encounter (HOSPITAL_COMMUNITY): Payer: Medicare Other

## 2022-02-06 ENCOUNTER — Ambulatory Visit (HOSPITAL_COMMUNITY)
Admission: RE | Admit: 2022-02-06 | Discharge: 2022-02-06 | Disposition: A | Discharge status: Home / Self Care | Payer: Medicare Other | Source: Ambulatory Visit | Attending: Orthopaedic Surgery | Admitting: Orthopaedic Surgery

## 2022-02-06 DIAGNOSIS — M48061 Spinal stenosis, lumbar region without neurogenic claudication: Secondary | ICD-10-CM | POA: Diagnosis not present

## 2022-02-06 DIAGNOSIS — M4316 Spondylolisthesis, lumbar region: Secondary | ICD-10-CM | POA: Diagnosis not present

## 2022-02-06 DIAGNOSIS — M5136 Other intervertebral disc degeneration, lumbar region: Secondary | ICD-10-CM | POA: Insufficient documentation

## 2022-02-06 DIAGNOSIS — M5126 Other intervertebral disc displacement, lumbar region: Secondary | ICD-10-CM | POA: Diagnosis not present

## 2022-02-08 ENCOUNTER — Encounter (HOSPITAL_COMMUNITY): Payer: Medicare Other

## 2022-02-13 ENCOUNTER — Encounter (HOSPITAL_COMMUNITY): Payer: Medicare Other

## 2022-02-16 ENCOUNTER — Encounter (HOSPITAL_COMMUNITY): Payer: Medicare Other | Admitting: Occupational Therapy

## 2022-02-20 ENCOUNTER — Encounter (HOSPITAL_COMMUNITY): Payer: Medicare Other

## 2022-02-22 ENCOUNTER — Encounter (HOSPITAL_COMMUNITY): Payer: Medicare Other

## 2022-03-29 ENCOUNTER — Ambulatory Visit (INDEPENDENT_AMBULATORY_CARE_PROVIDER_SITE_OTHER): Payer: Medicare Other | Admitting: Orthopaedic Surgery

## 2022-03-29 ENCOUNTER — Encounter: Payer: Self-pay | Admitting: Orthopaedic Surgery

## 2022-03-29 VITALS — Ht 66.0 in | Wt 260.0 lb

## 2022-03-29 DIAGNOSIS — Z96651 Presence of right artificial knee joint: Secondary | ICD-10-CM

## 2022-03-29 DIAGNOSIS — M1712 Unilateral primary osteoarthritis, left knee: Secondary | ICD-10-CM

## 2022-03-29 DIAGNOSIS — M4316 Spondylolisthesis, lumbar region: Secondary | ICD-10-CM

## 2022-03-29 DIAGNOSIS — M5136 Other intervertebral disc degeneration, lumbar region: Secondary | ICD-10-CM

## 2022-03-29 NOTE — Progress Notes (Signed)
Office Visit Note   Patient: [name]           Date of Birth: Mar 08, 1956           MRN: [id number] Visit Date: 03/29/2022              Requested by: Celene Squibb, MD [place],  [place] [address] PCP: Celene Squibb, MD   Assessment & Plan: Visit Diagnoses:  1. Spondylolisthesis, lumbar region   2. Degenerative disc disease, lumbar   3. Unilateral primary osteoarthritis, left knee   4. History of total right knee replacement     Plan: Patient can follow-up either for his back or knee as needed.  He is happy with the results of treatment last injection his knee in May still doing well.  Follow-up as needed.  Follow-Up Instructions: No follow-ups on file.   Orders:  No orders of the defined types were placed in this encounter.  No orders of the defined types were placed in this encounter.     Procedures: No procedures performed   Clinical Data: No additional findings.   Subjective: Chief Complaint  Patient presents with   Lower Back - Follow-up   Left Knee - Follow-up    HPI 66 year old male returns he states got married in March she still trying to gradually lose some weight and that is helping his back.  Knee injection in May continues to give him relief he is walking better.  His right total knee arthroplasty done 2019 by Dr. Durward Fortes still is doing well.  He has had some problems with his right shoulder partial rotator cuff tear but is avoiding outstretch overhead activities and states it also is better.  Patient denies claudication symptoms.  Lumbar MRI did show trace anterolisthesis at L4-5 of 5 mm with moderate subarticular stenosis on the right and moderate subarticular and foraminal stenosis on the left.  Review of Systems all systems are noncontributory to HPI.   Objective: Vital Signs: Ht '5\' 6"'$  (1.676 m)   Wt 260 lb (117.9 kg)   BMI 41.97 kg/m   Physical Exam Constitutional:      Appearance: He is well-developed.  HENT:      Head: Normocephalic and atraumatic.     Right Ear: External ear normal.     Left Ear: External ear normal.  Eyes:     Pupils: Pupils are equal, round, and reactive to light.  Neck:     Thyroid: No thyromegaly.     Trachea: No tracheal deviation.  Cardiovascular:     Rate and Rhythm: Normal rate.  Pulmonary:     Effort: Pulmonary effort is normal.     Breath sounds: No wheezing.  Abdominal:     General: Bowel sounds are normal.     Palpations: Abdomen is soft.  Musculoskeletal:     Cervical back: Neck supple.  Skin:    General: Skin is warm and dry.     Capillary Refill: Capillary refill takes less than 2 seconds.  Neurological:     Mental Status: He is alert and oriented to person, place, and time.  Psychiatric:        Behavior: Behavior normal.        Thought Content: Thought content normal.        Judgment: Judgment normal.     Ortho Exam negative straight leg raising 90 degrees gets from sitting standing quickly.  He can get his arm up over his head  on the right and left.  Negative drop arm test.  Normal heel-toe gait.  Well-healed midline right knee incision.  Left knee mild crepitus with knee range of motion.  He is ambulating without a limp.  Specialty Comments:  No specialty comments available.  Imaging: No results found.   PMFS History: Patient Active Problem List   Diagnosis Date Noted   Unilateral primary osteoarthritis, left knee 06/15/2020   Degenerative disc disease, lumbar 01/13/2020   Spondylolisthesis, lumbar region 01/13/2020   Rectal bleeding 11/06/2019   History of colonic polyps 11/06/2019   Morbid (severe) obesity due to excess calories ([facility]) 10/07/2019   Chronic pain of right knee 10/08/2018   Chronic right shoulder pain 05/07/2018   History of total right knee replacement 07/30/2017   Colon cancer screening 58/30/9407   Umbilical hernia without mention of obstruction or gangrene 10/28/2013   Past Medical History:  Diagnosis Date    Arthritis    "right knee" (07/30/2017)   CAP (community acquired pneumonia) 2018   Diabetes mellitus without complication ([facility])    Hypertension    Hypertension    Wears glasses     Family History  Problem Relation Age of Onset   Colon cancer Neg Hx    Colon polyps Neg Hx     Past Surgical History:  Procedure Laterality Date   COLONOSCOPY  2009   Fields: 3 small tubular adenomas removed.   COLONOSCOPY N/A 12/02/2019    three 4-5 mm polyps (hyperplastic and benign small bowel mucosa) in sigmoid colon at IC valve, redundant colon, non-bleeding internal hemorrhoids.    EYE SURGERY     FLEXIBLE SIGMOIDOSCOPY N/A 08/25/2020   Procedure: FLEXIBLE SIGMOIDOSCOPY WITH PROPOFOL;  Surgeon: [name], DO;  [facility];  Service: Endoscopy;  Laterality: N/A;  7:30am   FLEXIBLE SIGMOIDOSCOPY N/A 09/30/2020   Procedure: FLEXIBLE SIGMOIDOSCOPY;  Surgeon: [name], DO;  [facility];  Service: Endoscopy;  Laterality: N/A;  am appt   JOINT REPLACEMENT     POLYPECTOMY  12/02/2019   Procedure: POLYPECTOMY;  Surgeon: Daneil Dolin, MD;  [facility];  Service: Endoscopy;;   RETINAL DETACHMENT SURGERY Left 2000s   TOTAL KNEE ARTHROPLASTY Right 07/30/2017   TOTAL KNEE ARTHROPLASTY Right 07/30/2017   Procedure: RIGHT TOTAL KNEE ARTHROPLASTY;  Surgeon: [name], MD;  Location: [facility];  Service: Orthopedics;  Laterality: Right;   VASECTOMY     Social History   Occupational History   Not on file  Tobacco Use   Smoking status: Never   Smokeless tobacco: Never  Vaping Use   Vaping Use: Never used  Substance and Sexual Activity   Alcohol use: No   Drug use: No   Sexual activity: Never

## 2022-05-01 DIAGNOSIS — B3749 Other urogenital candidiasis: Secondary | ICD-10-CM | POA: Diagnosis not present

## 2022-05-08 DIAGNOSIS — R519 Headache, unspecified: Secondary | ICD-10-CM | POA: Diagnosis not present

## 2022-05-08 DIAGNOSIS — J019 Acute sinusitis, unspecified: Secondary | ICD-10-CM | POA: Diagnosis not present

## 2022-05-08 DIAGNOSIS — U071 COVID-19: Secondary | ICD-10-CM | POA: Diagnosis not present

## 2022-05-08 DIAGNOSIS — R059 Cough, unspecified: Secondary | ICD-10-CM | POA: Diagnosis not present

## 2022-06-05 DIAGNOSIS — E1165 Type 2 diabetes mellitus with hyperglycemia: Secondary | ICD-10-CM | POA: Diagnosis not present

## 2022-06-05 DIAGNOSIS — E782 Mixed hyperlipidemia: Secondary | ICD-10-CM | POA: Diagnosis not present

## 2022-06-12 DIAGNOSIS — H338 Other retinal detachments: Secondary | ICD-10-CM | POA: Diagnosis not present

## 2022-06-12 DIAGNOSIS — Z0001 Encounter for general adult medical examination with abnormal findings: Secondary | ICD-10-CM | POA: Diagnosis not present

## 2022-06-12 DIAGNOSIS — R945 Abnormal results of liver function studies: Secondary | ICD-10-CM | POA: Diagnosis not present

## 2022-06-12 DIAGNOSIS — K429 Umbilical hernia without obstruction or gangrene: Secondary | ICD-10-CM | POA: Diagnosis not present

## 2022-06-12 DIAGNOSIS — D72829 Elevated white blood cell count, unspecified: Secondary | ICD-10-CM | POA: Diagnosis not present

## 2022-06-12 DIAGNOSIS — E782 Mixed hyperlipidemia: Secondary | ICD-10-CM | POA: Diagnosis not present

## 2022-06-12 DIAGNOSIS — M25519 Pain in unspecified shoulder: Secondary | ICD-10-CM | POA: Diagnosis not present

## 2022-06-12 DIAGNOSIS — Z86010 Personal history of colonic polyps: Secondary | ICD-10-CM | POA: Diagnosis not present

## 2022-06-12 DIAGNOSIS — Z96651 Presence of right artificial knee joint: Secondary | ICD-10-CM | POA: Diagnosis not present

## 2022-06-12 DIAGNOSIS — E1165 Type 2 diabetes mellitus with hyperglycemia: Secondary | ICD-10-CM | POA: Diagnosis not present

## 2022-09-11 DIAGNOSIS — H33022 Retinal detachment with multiple breaks, left eye: Secondary | ICD-10-CM | POA: Diagnosis not present

## 2022-09-11 DIAGNOSIS — H31002 Unspecified chorioretinal scars, left eye: Secondary | ICD-10-CM | POA: Diagnosis not present

## 2022-09-11 DIAGNOSIS — Z7984 Long term (current) use of oral hypoglycemic drugs: Secondary | ICD-10-CM | POA: Diagnosis not present

## 2022-09-11 DIAGNOSIS — E119 Type 2 diabetes mellitus without complications: Secondary | ICD-10-CM | POA: Diagnosis not present

## 2022-09-11 DIAGNOSIS — H2513 Age-related nuclear cataract, bilateral: Secondary | ICD-10-CM | POA: Diagnosis not present

## 2022-09-27 ENCOUNTER — Ambulatory Visit (INDEPENDENT_AMBULATORY_CARE_PROVIDER_SITE_OTHER): Payer: 59 | Admitting: Orthopaedic Surgery

## 2022-09-27 ENCOUNTER — Other Ambulatory Visit: Payer: Self-pay

## 2022-09-27 ENCOUNTER — Encounter: Payer: Self-pay | Admitting: Orthopaedic Surgery

## 2022-09-27 VITALS — Ht 66.0 in | Wt 277.0 lb

## 2022-09-27 DIAGNOSIS — M25562 Pain in left knee: Secondary | ICD-10-CM | POA: Diagnosis not present

## 2022-09-27 DIAGNOSIS — G8929 Other chronic pain: Secondary | ICD-10-CM

## 2022-09-27 NOTE — Progress Notes (Signed)
x  Office Visit Note   Patient: [name]           Date of Birth: 08/27/1955           MRN: [id number] Visit Date: 09/27/2022              Requested by: Celene Squibb, MD [address] [place],  [place] [address] PCP: Celene Squibb, MD   Assessment & Plan: Visit Diagnoses:  1. Chronic pain of left knee     Plan: We reviewed results of his previous MRI August 2023 which showed multilevel areas of mild to moderate narrowing involving at least 4 lumbar levels.  He will work on weight loss dieting, walking program.  Knee gets worse he needs an injection he will return if back flares on him is having problems walking he will call about an epidural.  Recheck 4 months we will weigh him on return and he states he is going to lose some weight.  Follow-Up Instructions: No follow-ups on file.   Orders:  Orders Placed This Encounter  Procedures   XR Knee 1-2 Views Left   No orders of the defined types were placed in this encounter.     Procedures: No procedures performed   Clinical Data: No additional findings.   Subjective: Chief Complaint  Patient presents with   Left Knee - Pain   Right Shoulder - Follow-up    HPI patient returns for follow-up of the left knee osteoarthritis previous right total knee arthroplasty by Dr. Durward Fortes doing well without pain.  He has had injections in his left knee.  Supposedly working on weight loss but states since he got married he and his wife both have put on some weight instead of losing weight.  He is having some back discomfort he had previous epidurals last 1 was 2023.  Review of Systems all systems noncontributory updated unchanged.   Objective: Vital Signs: Ht 5\' 6"  (1.676 m)   Wt 277 lb (125.6 kg)   BMI 44.71 kg/m   Physical Exam Constitutional:      Appearance: He is well-developed.  HENT:     Head: Normocephalic and atraumatic.     Right Ear: External ear normal.     Left Ear: External ear normal.  Eyes:      Pupils: Pupils are equal, round, and reactive to light.  Neck:     Thyroid: No thyromegaly.     Trachea: No tracheal deviation.  Cardiovascular:     Rate and Rhythm: Normal rate.  Pulmonary:     Effort: Pulmonary effort is normal.     Breath sounds: No wheezing.  Abdominal:     General: Bowel sounds are normal.     Palpations: Abdomen is soft.  Musculoskeletal:     Cervical back: Neck supple.  Skin:    General: Skin is warm and dry.     Capillary Refill: Capillary refill takes less than 2 seconds.  Neurological:     Mental Status: He is alert and oriented to person, place, and time.  Psychiatric:        Behavior: Behavior normal.        Thought Content: Thought content normal.        Judgment: Judgment normal.     Ortho Exam medial joint line tenderness mild crepitus no effusion left knee well-healed right total knee arthroplasty midline incision good flexion extension good quad strength.  Negative straight leg raising on the right to 90  degrees.  Specialty Comments:  No specialty comments available.  Imaging: No results found.   PMFS History: Patient Active Problem List   Diagnosis Date Noted   Unilateral primary osteoarthritis, left knee 06/15/2020   Degenerative disc disease, lumbar 01/13/2020   Spondylolisthesis, lumbar region 01/13/2020   Rectal bleeding 11/06/2019   History of colonic polyps 11/06/2019   Morbid (severe) obesity due to excess calories ([facility]) 10/07/2019   Chronic pain of right knee 10/08/2018   Chronic right shoulder pain 05/07/2018   History of total right knee replacement 07/30/2017   Colon cancer screening [id number]   Umbilical hernia without mention of obstruction or gangrene 10/28/2013   Past Medical History:  Diagnosis Date   Arthritis    "right knee" (07/30/2017)   CAP (community acquired pneumonia) 2018   Diabetes mellitus without complication ([facility])    Hypertension    Hypertension    Wears glasses     Family History  Problem  Relation Age of Onset   Colon cancer Neg Hx    Colon polyps Neg Hx     Past Surgical History:  Procedure Laterality Date   COLONOSCOPY  2009   Fields: 3 small tubular adenomas removed.   COLONOSCOPY N/A 12/02/2019    three 4-5 mm polyps (hyperplastic and benign small bowel mucosa) in sigmoid colon at IC valve, redundant colon, non-bleeding internal hemorrhoids.    EYE SURGERY     FLEXIBLE SIGMOIDOSCOPY N/A 08/25/2020   Procedure: FLEXIBLE SIGMOIDOSCOPY WITH PROPOFOL;  Surgeon: [name], DO;  [facility];  Service: Endoscopy;  Laterality: N/A;  7:30am   FLEXIBLE SIGMOIDOSCOPY N/A 09/30/2020   Procedure: FLEXIBLE SIGMOIDOSCOPY;  Surgeon: [name], DO;  [facility];  Service: Endoscopy;  Laterality: N/A;  am appt   JOINT REPLACEMENT     POLYPECTOMY  12/02/2019   Procedure: POLYPECTOMY;  Surgeon: Daneil Dolin, MD;  [facility];  Service: Endoscopy;;   RETINAL DETACHMENT SURGERY Left 2000s   TOTAL KNEE ARTHROPLASTY Right 07/30/2017   TOTAL KNEE ARTHROPLASTY Right 07/30/2017   Procedure: RIGHT TOTAL KNEE ARTHROPLASTY;  Surgeon: [name], MD;  Location: [facility];  Service: Orthopedics;  Laterality: Right;   VASECTOMY     Social History   Occupational History   Not on file  Tobacco Use   Smoking status: Never   Smokeless tobacco: Never  Vaping Use   Vaping Use: Never used  Substance and Sexual Activity   Alcohol use: No   Drug use: No   Sexual activity: Never

## 2022-12-06 DIAGNOSIS — E1165 Type 2 diabetes mellitus with hyperglycemia: Secondary | ICD-10-CM | POA: Diagnosis not present

## 2022-12-06 DIAGNOSIS — E782 Mixed hyperlipidemia: Secondary | ICD-10-CM | POA: Diagnosis not present

## 2022-12-12 DIAGNOSIS — H338 Other retinal detachments: Secondary | ICD-10-CM | POA: Diagnosis not present

## 2022-12-12 DIAGNOSIS — Z86010 Personal history of colonic polyps: Secondary | ICD-10-CM | POA: Diagnosis not present

## 2022-12-12 DIAGNOSIS — Z96651 Presence of right artificial knee joint: Secondary | ICD-10-CM | POA: Diagnosis not present

## 2022-12-12 DIAGNOSIS — M25519 Pain in unspecified shoulder: Secondary | ICD-10-CM | POA: Diagnosis not present

## 2022-12-12 DIAGNOSIS — K429 Umbilical hernia without obstruction or gangrene: Secondary | ICD-10-CM | POA: Diagnosis not present

## 2022-12-12 DIAGNOSIS — Z0001 Encounter for general adult medical examination with abnormal findings: Secondary | ICD-10-CM | POA: Diagnosis not present

## 2022-12-12 DIAGNOSIS — R945 Abnormal results of liver function studies: Secondary | ICD-10-CM | POA: Diagnosis not present

## 2022-12-12 DIAGNOSIS — E1165 Type 2 diabetes mellitus with hyperglycemia: Secondary | ICD-10-CM | POA: Diagnosis not present

## 2022-12-12 DIAGNOSIS — R319 Hematuria, unspecified: Secondary | ICD-10-CM | POA: Diagnosis not present

## 2022-12-12 DIAGNOSIS — E782 Mixed hyperlipidemia: Secondary | ICD-10-CM | POA: Diagnosis not present

## 2022-12-31 DIAGNOSIS — R42 Dizziness and giddiness: Secondary | ICD-10-CM | POA: Diagnosis not present

## 2022-12-31 DIAGNOSIS — Z713 Dietary counseling and surveillance: Secondary | ICD-10-CM | POA: Diagnosis not present

## 2022-12-31 DIAGNOSIS — I10 Essential (primary) hypertension: Secondary | ICD-10-CM | POA: Diagnosis not present

## 2023-01-24 ENCOUNTER — Ambulatory Visit: Payer: 59 | Admitting: Orthopaedic Surgery

## 2023-01-24 ENCOUNTER — Encounter: Payer: Self-pay | Admitting: Orthopaedic Surgery

## 2023-01-24 VITALS — Ht 66.0 in | Wt 271.2 lb

## 2023-01-24 DIAGNOSIS — M1712 Unilateral primary osteoarthritis, left knee: Secondary | ICD-10-CM

## 2023-01-24 NOTE — Progress Notes (Signed)
Office Visit Note   Patient: [name]           Date of Birth: 09-28-1955           MRN: [id number] Visit Date: 01/24/2023              Requested by: Benita Stabile, MD [address] [place],  [place] [address] PCP: Benita Stabile, MD   Assessment & Plan: Visit Diagnoses:  1. Unilateral primary osteoarthritis, left knee     Plan: Patient continue with his diet avoiding sweets.  He is gradually losing weight and once he reaches his goal weight or if he has increased pain in his knee he can return.  Follow-Up Instructions: No follow-ups on file.   Orders:  No orders of the defined types were placed in this encounter.  No orders of the defined types were placed in this encounter.     Procedures: No procedures performed   Clinical Data: No additional findings.   Subjective: Chief Complaint  Patient presents with   Right Shoulder - Pain   Lower Back - Follow-up   Left Knee - Follow-up    HPI follow-up exam for left knee osteoarthritis.  He is lost 7 pounds states his knee is doing better still has some discomfort in his shoulder has partial rotator cuff tear but he can still get his arm up over his head does not bother him on a daily basis just sometimes feels a little achy.  Left knee is no longer catching he has had previous right total knee arthroplasty by Dr. Cleophas Dunker in 2019 doing well.  His goal is to get his weight down to 247 so that he can get left total knee arthroplasty if he has continued ongoing symptoms.  Review of Systems updated unchanged   Objective: Vital Signs: Ht 5\' 6"  (1.676 m)   Wt 271 lb 3.2 oz (123 kg)   BMI 43.77 kg/m   Physical Exam Constitutional:      Appearance: He is well-developed.  HENT:     Head: Normocephalic and atraumatic.     Right Ear: External ear normal.     Left Ear: External ear normal.  Eyes:     Pupils: Pupils are equal, round, and reactive to light.  Neck:     Thyroid: No thyromegaly.     Trachea: No  tracheal deviation.  Cardiovascular:     Rate and Rhythm: Normal rate.  Pulmonary:     Effort: Pulmonary effort is normal.     Breath sounds: No wheezing.  Abdominal:     General: Bowel sounds are normal.     Palpations: Abdomen is soft.  Musculoskeletal:     Cervical back: Neck supple.  Skin:    General: Skin is warm and dry.     Capillary Refill: Capillary refill takes less than 2 seconds.  Neurological:     Mental Status: He is alert and oriented to person, place, and time.  Psychiatric:        Behavior: Behavior normal.        Thought Content: Thought content normal.        Judgment: Judgment normal.     Ortho Exam negative logroll hips he is amatory with a trace left knee limp.  Mild medial and lateral joint line tenderness crepitus with knee extension.  Mild impingement right shoulder.  He can get his arm up over his head with complete flexion and abduction without pain.  Full  elbow extension.  Specialty Comments:  No specialty comments available.  Imaging: No results found.   PMFS History: Patient Active Problem List   Diagnosis Date Noted   Unilateral primary osteoarthritis, left knee 06/15/2020   Degenerative disc disease, lumbar 01/13/2020   Spondylolisthesis, lumbar region 01/13/2020   Rectal bleeding 11/06/2019   History of colonic polyps 11/06/2019   Morbid (severe) obesity due to excess calories ([facility]) 10/07/2019   Chronic pain of right knee 10/08/2018   Chronic right shoulder pain 05/07/2018   History of total right knee replacement 07/30/2017   Colon cancer screening 10/28/2013   Umbilical hernia without mention of obstruction or gangrene 10/28/2013   Past Medical History:  Diagnosis Date   Arthritis    "right knee" (07/30/2017)   CAP (community acquired pneumonia) 2018   Diabetes mellitus without complication ([facility])    Hypertension    Hypertension    Wears glasses     Family History  Problem Relation Age of Onset   Colon cancer Neg Hx    Colon  polyps Neg Hx     Past Surgical History:  Procedure Laterality Date   COLONOSCOPY  2009   Fields: 3 small tubular adenomas removed.   COLONOSCOPY N/A 12/02/2019    three 4-5 mm polyps (hyperplastic and benign small bowel mucosa) in sigmoid colon at IC valve, redundant colon, non-bleeding internal hemorrhoids.    EYE SURGERY     FLEXIBLE SIGMOIDOSCOPY N/A 08/25/2020   Procedure: FLEXIBLE SIGMOIDOSCOPY WITH PROPOFOL;  Surgeon: Lanelle Bal, DO;  [facility];  Service: Endoscopy;  Laterality: N/A;  7:30am   FLEXIBLE SIGMOIDOSCOPY N/A 09/30/2020   Procedure: FLEXIBLE SIGMOIDOSCOPY;  Surgeon: Lanelle Bal, DO;  [facility];  Service: Endoscopy;  Laterality: N/A;  am appt   JOINT REPLACEMENT     POLYPECTOMY  12/02/2019   Procedure: POLYPECTOMY;  Surgeon: Corbin Ade, MD;  [facility];  Service: Endoscopy;;   RETINAL DETACHMENT SURGERY Left 2000s   TOTAL KNEE ARTHROPLASTY Right 07/30/2017   TOTAL KNEE ARTHROPLASTY Right 07/30/2017   Procedure: RIGHT TOTAL KNEE ARTHROPLASTY;  Surgeon: Valeria Batman, MD;  [facility];  Service: Orthopedics;  Laterality: Right;   VASECTOMY     Social History   Occupational History   Not on file  Tobacco Use   Smoking status: Never   Smokeless tobacco: Never  Vaping Use   Vaping status: Never Used  Substance and Sexual Activity   Alcohol use: No   Drug use: No   Sexual activity: Never

## 2023-02-28 ENCOUNTER — Ambulatory Visit (INDEPENDENT_AMBULATORY_CARE_PROVIDER_SITE_OTHER): Payer: 59 | Admitting: Orthopaedic Surgery

## 2023-02-28 ENCOUNTER — Encounter: Payer: Self-pay | Admitting: Orthopaedic Surgery

## 2023-02-28 ENCOUNTER — Ambulatory Visit: Payer: 59 | Admitting: Orthopaedic Surgery

## 2023-02-28 VITALS — Ht 66.0 in | Wt 269.0 lb

## 2023-02-28 DIAGNOSIS — G8929 Other chronic pain: Secondary | ICD-10-CM | POA: Diagnosis not present

## 2023-02-28 DIAGNOSIS — M25511 Pain in right shoulder: Secondary | ICD-10-CM

## 2023-02-28 NOTE — Progress Notes (Signed)
My shoulder is hurting bad again.  He wants an injection to the right shoulder as it has been bothering him more.  Dr. Ophelia Charter had said to come in if he needed an injection, surgery was not recommended.  ROM of the right shoulder is good with pain in the extremes.  NV intact.  ROM of the neck is full.  Encounter Diagnosis  Name Primary?   Chronic right shoulder pain Yes     PROCEDURE NOTE:  The patient request injection, verbal consent was obtained.  The right shoulder was prepped appropriately after time out was performed.   Sterile technique was observed and injection of 1 cc of DepoMedrol 40mg  with several cc's of plain xylocaine. Anesthesia was provided by ethyl chloride and a 20-gauge needle was used to inject the shoulder area. A posterior approach was used.  The injection was tolerated well.  A band aid dressing was applied.  The patient was advised to apply ice later today and tomorrow to the injection sight as needed.  Return prn.  Call if any problem.  Precautions discussed.  Electronically Signed Darreld Mclean, MD 8/29/202412:32 PM

## 2023-03-12 DIAGNOSIS — E1165 Type 2 diabetes mellitus with hyperglycemia: Secondary | ICD-10-CM | POA: Diagnosis not present

## 2023-03-12 DIAGNOSIS — E782 Mixed hyperlipidemia: Secondary | ICD-10-CM | POA: Diagnosis not present

## 2023-03-26 DIAGNOSIS — Z96651 Presence of right artificial knee joint: Secondary | ICD-10-CM | POA: Diagnosis not present

## 2023-03-26 DIAGNOSIS — H338 Other retinal detachments: Secondary | ICD-10-CM | POA: Diagnosis not present

## 2023-03-26 DIAGNOSIS — R945 Abnormal results of liver function studies: Secondary | ICD-10-CM | POA: Diagnosis not present

## 2023-03-26 DIAGNOSIS — K429 Umbilical hernia without obstruction or gangrene: Secondary | ICD-10-CM | POA: Diagnosis not present

## 2023-03-26 DIAGNOSIS — M545 Low back pain, unspecified: Secondary | ICD-10-CM | POA: Diagnosis not present

## 2023-03-26 DIAGNOSIS — E782 Mixed hyperlipidemia: Secondary | ICD-10-CM | POA: Diagnosis not present

## 2023-03-26 DIAGNOSIS — E1165 Type 2 diabetes mellitus with hyperglycemia: Secondary | ICD-10-CM | POA: Diagnosis not present

## 2023-03-26 DIAGNOSIS — M25519 Pain in unspecified shoulder: Secondary | ICD-10-CM | POA: Diagnosis not present

## 2023-03-26 DIAGNOSIS — I10 Essential (primary) hypertension: Secondary | ICD-10-CM | POA: Diagnosis not present

## 2023-04-26 DIAGNOSIS — S29011A Strain of muscle and tendon of front wall of thorax, initial encounter: Secondary | ICD-10-CM | POA: Diagnosis not present

## 2023-04-26 DIAGNOSIS — R079 Chest pain, unspecified: Secondary | ICD-10-CM | POA: Diagnosis not present

## 2023-04-26 DIAGNOSIS — T148XXA Other injury of unspecified body region, initial encounter: Secondary | ICD-10-CM | POA: Diagnosis not present

## 2023-06-20 ENCOUNTER — Ambulatory Visit: Payer: 59 | Admitting: Orthopaedic Surgery

## 2023-06-20 ENCOUNTER — Encounter: Payer: Self-pay | Admitting: Orthopaedic Surgery

## 2023-06-20 DIAGNOSIS — M1712 Unilateral primary osteoarthritis, left knee: Secondary | ICD-10-CM

## 2023-06-20 DIAGNOSIS — Z96651 Presence of right artificial knee joint: Secondary | ICD-10-CM

## 2023-06-20 NOTE — Progress Notes (Signed)
Office Visit Note   Patient: [name]           Date of Birth: Oct 09, 1955           MRN: [id number] Visit Date: 06/20/2023              Requested by: Benita Stabile, MD [address] [place],  [place] [address] PCP: Benita Stabile, MD   Assessment & Plan: Visit Diagnoses:  1. Unilateral primary osteoarthritis, left knee   2. History of total right knee replacement     Plan: Injection performed left knee which she tolerated well.  He is asking about an epidural which she has had in the past for his chronic disc degeneration.  We discussed hopefully some of the knee injection may leak out and give him some lumbar improvement.  His spondylolisthesis on previous lumbar imaging studies.  Right knee arthroplasty is doing well and he will continue to try to work on gradual weight loss.  We will await patient on return visit.  Follow-Up Instructions: No follow-ups on file.   Orders:  Orders Placed This Encounter  Procedures   Large Joint Inj   No orders of the defined types were placed in this encounter.     Procedures: Large Joint Inj: L knee on 06/24/2023 12:14 PM Indications: joint swelling and pain Details: 22 G 1.5 in needle, anterolateral approach  Arthrogram: No  Medications: 0.5 mL lidocaine 1 %; 3 mL bupivacaine 0.5 %; 40 mg methylPREDNISolone acetate 40 MG/ML Outcome: tolerated well, no immediate complications Procedure, treatment alternatives, risks and benefits explained, specific risks discussed. Consent was given by the patient. Immediately prior to procedure a time out was called to verify the correct patient, procedure, equipment, support staff and site/side marked as required. Patient was prepped and draped in the usual sterile fashion.       Clinical Data: No additional findings.   Subjective: Chief Complaint  Patient presents with   Left Knee - Follow-up, Pain   Lower Back - Pain, Follow-up    HPI 67 year old male returns he states he is  having increased problems with his knee with pain swelling difficulty ambulating and is requesting an injection.  He has had 1 in the past which was last year for his left knee osteoarthritis.  He has previous right total knee arthroplasty by Dr. Cleophas Dunker 2019.  Review of Systems BMI is greater than 40 he has been working hard on losing weight.   Objective: Vital Signs: There were no vitals taken for this visit.  Physical Exam Constitutional:      Appearance: He is well-developed.  HENT:     Head: Normocephalic and atraumatic.     Right Ear: External ear normal.     Left Ear: External ear normal.  Eyes:     Pupils: Pupils are equal, round, and reactive to light.  Neck:     Thyroid: No thyromegaly.     Trachea: No tracheal deviation.  Cardiovascular:     Rate and Rhythm: Normal rate.  Pulmonary:     Effort: Pulmonary effort is normal.     Breath sounds: No wheezing.  Abdominal:     General: Bowel sounds are normal.     Palpations: Abdomen is soft.  Musculoskeletal:     Cervical back: Neck supple.  Skin:    General: Skin is warm and dry.     Capillary Refill: Capillary refill takes less than 2 seconds.  Neurological:  Mental Status: He is alert and oriented to person, place, and time.  Psychiatric:        Behavior: Behavior normal.        Thought Content: Thought content normal.        Judgment: Judgment normal.     Ortho Exam crepitus with left knee range of motion.  Reflexes are intact negative logroll hips.  Specialty Comments:  No specialty comments available.  Imaging: No results found.   PMFS History: Patient Active Problem List   Diagnosis Date Noted   Unilateral primary osteoarthritis, left knee 06/15/2020   Degenerative disc disease, lumbar 01/13/2020   Spondylolisthesis, lumbar region 01/13/2020   Rectal bleeding 11/06/2019   History of colonic polyps 11/06/2019   Morbid (severe) obesity due to excess calories ([facility]) 10/07/2019   Chronic pain of  right knee 10/08/2018   Chronic right shoulder pain 05/07/2018   History of total right knee replacement 07/30/2017   Colon cancer screening 10/28/2013   Umbilical hernia 10/28/2013   Past Medical History:  Diagnosis Date   Arthritis    "right knee" (07/30/2017)   CAP (community acquired pneumonia) 2018   Diabetes mellitus without complication ([facility])    Hypertension    Hypertension    Wears glasses     Family History  Problem Relation Age of Onset   Colon cancer Neg Hx    Colon polyps Neg Hx     Past Surgical History:  Procedure Laterality Date   COLONOSCOPY  2009   Fields: 3 small tubular adenomas removed.   COLONOSCOPY N/A 12/02/2019    three 4-5 mm polyps (hyperplastic and benign small bowel mucosa) in sigmoid colon at IC valve, redundant colon, non-bleeding internal hemorrhoids.    EYE SURGERY     FLEXIBLE SIGMOIDOSCOPY N/A 08/25/2020   Procedure: FLEXIBLE SIGMOIDOSCOPY WITH PROPOFOL;  Surgeon: Lanelle Bal, DO;  [facility];  Service: Endoscopy;  Laterality: N/A;  7:30am   FLEXIBLE SIGMOIDOSCOPY N/A 09/30/2020   Procedure: FLEXIBLE SIGMOIDOSCOPY;  Surgeon: Lanelle Bal, DO;  [facility];  Service: Endoscopy;  Laterality: N/A;  am appt   JOINT REPLACEMENT     POLYPECTOMY  12/02/2019   Procedure: POLYPECTOMY;  Surgeon: Corbin Ade, MD;  [facility];  Service: Endoscopy;;   RETINAL DETACHMENT SURGERY Left 2000s   TOTAL KNEE ARTHROPLASTY Right 07/30/2017   TOTAL KNEE ARTHROPLASTY Right 07/30/2017   Procedure: RIGHT TOTAL KNEE ARTHROPLASTY;  Surgeon: Valeria Batman, MD;  [facility];  Service: Orthopedics;  Laterality: Right;   VASECTOMY     Social History   Occupational History   Not on file  Tobacco Use   Smoking status: Never   Smokeless tobacco: Never  Vaping Use   Vaping status: Never Used  Substance and Sexual Activity   Alcohol use: No   Drug use: No   Sexual activity: Never

## 2023-06-24 DIAGNOSIS — M1712 Unilateral primary osteoarthritis, left knee: Secondary | ICD-10-CM

## 2023-06-24 MED ORDER — METHYLPREDNISOLONE ACETATE 40 MG/ML IJ SUSP
40.0000 mg | INTRAMUSCULAR | Status: AC | PRN
  Administered 2023-06-24: 40 mg via INTRA_ARTICULAR

## 2023-06-24 MED ORDER — LIDOCAINE HCL 1 % IJ SOLN
0.5000 mL | INTRAMUSCULAR | Status: AC | PRN
  Administered 2023-06-24: .5 mL

## 2023-06-24 MED ORDER — BUPIVACAINE HCL 0.5 % IJ SOLN
3.0000 mL | INTRAMUSCULAR | Status: AC | PRN
  Administered 2023-06-24: 3 mL via INTRA_ARTICULAR

## 2023-07-17 DIAGNOSIS — E1165 Type 2 diabetes mellitus with hyperglycemia: Secondary | ICD-10-CM | POA: Diagnosis not present

## 2023-07-17 DIAGNOSIS — E782 Mixed hyperlipidemia: Secondary | ICD-10-CM | POA: Diagnosis not present

## 2023-07-22 NOTE — Progress Notes (Incomplete)
07/23/2023 1:37 PM   [name] February 20, 1956 [id number]  Referring provider: Leone Payor, FNP [address] [place],  [place] [address]  No chief complaint on file.   HPI: 68 yo male sent by Dr Margo Aye for E/M of hematuria.    PMH: Past Medical History:  Diagnosis Date   Arthritis    "right knee" (07/30/2017)   CAP (community acquired pneumonia) 2018   Diabetes mellitus without complication (HCC)    Hypertension    Hypertension    Wears glasses     Surgical History: Past Surgical History:  Procedure Laterality Date   COLONOSCOPY  2009   Fields: 3 small tubular adenomas removed.   COLONOSCOPY N/A 12/02/2019    three 4-5 mm polyps (hyperplastic and benign small bowel mucosa) in sigmoid colon at IC valve, redundant colon, non-bleeding internal hemorrhoids.    EYE SURGERY     FLEXIBLE SIGMOIDOSCOPY N/A 08/25/2020   Procedure: FLEXIBLE SIGMOIDOSCOPY WITH PROPOFOL;  Surgeon: Lanelle Bal, DO;  Location: [facility];  Service: Endoscopy;  Laterality: N/A;  7:30am   FLEXIBLE SIGMOIDOSCOPY N/A 09/30/2020   Procedure: FLEXIBLE SIGMOIDOSCOPY;  Surgeon: Lanelle Bal, DO;  Location: [facility];  Service: Endoscopy;  Laterality: N/A;  am appt   JOINT REPLACEMENT     POLYPECTOMY  12/02/2019   Procedure: POLYPECTOMY;  Surgeon: Corbin Ade, MD;  Location: [facility];  Service: Endoscopy;;   RETINAL DETACHMENT SURGERY Left 2000s   TOTAL KNEE ARTHROPLASTY Right 07/30/2017   TOTAL KNEE ARTHROPLASTY Right 07/30/2017   Procedure: RIGHT TOTAL KNEE ARTHROPLASTY;  Surgeon: Valeria Batman, MD;  Location: [facility];  Service: Orthopedics;  Laterality: Right;   VASECTOMY      Home Medications:  Allergies as of 07/23/2023       Reactions   Flexeril [cyclobenzaprine] Shortness Of Breath   Can not take any muscle relaxer causes respirator depression   Robaxin [methocarbamol] Shortness Of Breath   Can not take any muscle relaxer causes respirator depression   Penicillins          Medication List        Accurate as of July 22, 2023  1:37 PM. If you have any questions, ask your nurse or doctor.          acetaminophen 325 MG tablet Commonly known as: Tylenol Take 2 tablets (650 mg total) by mouth every 4 (four) hours as needed. What changed: reasons to take this   gabapentin 100 MG capsule Commonly known as: Neurontin Take 1 capsule (100 mg total) by mouth 3 (three) times daily.   HYDROcodone-acetaminophen 5-325 MG tablet Commonly known as: NORCO/VICODIN Take 1 tablet by mouth every 6 (six) hours as needed for severe pain.   ibuprofen 200 MG tablet Commonly known as: ADVIL Take 400-800 mg by mouth every 8 (eight) hours as needed (pain).   lidocaine 5 % Commonly known as: Lidoderm Place 1 patch onto the skin daily. Remove & Discard patch within 12 hours or as directed by MD   losartan-hydrochlorothiazide 50-12.5 MG tablet Commonly known as: HYZAAR Take 1 tablet by mouth daily.   meloxicam 7.5 MG tablet Commonly known as: Mobic Take 1 tablet (7.5 mg total) by mouth daily.   metFORMIN 500 MG tablet Commonly known as: GLUCOPHAGE Take 500 mg by mouth in the morning.   predniSONE 10 MG tablet Commonly known as: DELTASONE 6, 5, 4, 3, 2 then 1 tablet by mouth daily for 6 days total.  sildenafil 20 MG tablet Commonly known as: REVATIO Take 40-100 mg by mouth daily as needed (erectile dysfunction).        Allergies:  Allergies  Allergen Reactions   Flexeril [Cyclobenzaprine] Shortness Of Breath    Can not take any muscle relaxer causes respirator depression    Robaxin [Methocarbamol] Shortness Of Breath    Can not take any muscle relaxer causes respirator depression   Penicillins     Family History: Family History  Problem Relation Age of Onset   Colon cancer Neg Hx    Colon polyps Neg Hx     Social History:  reports that he has never smoked. He has never used smokeless tobacco. He reports that he does not drink  alcohol and does not use drugs.  ROS: All other review of systems were reviewed and are negative except what is noted above in HPI  Physical Exam: There were no vitals taken for this visit.  Constitutional:  Alert and oriented, No acute distress. HEENT: [place] AT, moist mucus membranes.  Trachea midline, no masses. Cardiovascular: No clubbing, cyanosis, or edema. Respiratory: Normal respiratory effort, no increased work of breathing. GI: Abdomen is soft, nontender, nondistended, no abdominal masses GU: No CVA tenderness. Circumcised phallus. No masses/lesions on penis, testis, scrotum. Prostate 40g smooth no nodules no induration.  Lymph: No cervical or inguinal lymphadenopathy. Skin: No rashes, bruises or suspicious lesions. Neurologic: Grossly intact, no focal deficits, moving all 4 extremities. Psychiatric: Normal mood and affect.  Laboratory Data: Lab Results  Component Value Date   WBC 11.7 (H) 07/29/2020   HGB 13.5 07/29/2020   HCT 40.2 07/29/2020   MCV 91.6 07/29/2020   PLT 238 07/29/2020    Lab Results  Component Value Date   CREATININE 0.82 08/23/2020    No results found for: "PSA"  No results found for: "TESTOSTERONE"  No results found for: "HGBA1C"  Urinalysis    Component Value Date/Time   COLORURINE YELLOW 07/23/2017 1147   APPEARANCEUR CLEAR 07/23/2017 1147   LABSPEC 1.012 07/23/2017 1147   PHURINE 6.0 07/23/2017 1147   GLUCOSEU NEGATIVE 07/23/2017 1147   HGBUR NEGATIVE 07/23/2017 1147   BILIRUBINUR NEGATIVE 07/23/2017 1147   KETONESUR NEGATIVE 07/23/2017 1147   PROTEINUR NEGATIVE 07/23/2017 1147   NITRITE NEGATIVE 07/23/2017 1147   LEUKOCYTESUR NEGATIVE 07/23/2017 1147    Lab Results  Component Value Date   BACTERIA NONE SEEN 03/18/2017    Pertinent Imaging: *** Results for orders placed during the hospital encounter of 07/07/05  DG Abd 1 View  Narrative History: Diarrhea, dehydration  ABDOMEN ONE VIEW:  A few minimally prominent loops  of small bowel in right midabdomen. Gas present in colon. Pattern overall nonspecific. No bowel wall thickening or pathologic calcification. Bones unremarkable.  IMPRESSION: Nonspecific small bowel prominence in right midabdomen, question regional ileus though early obstruction not excluded.  Provider: Ladene Artist  No results found for this or any previous visit.  No results found for this or any previous visit.  No results found for this or any previous visit.  No results found for this or any previous visit.  No results found for this or any previous visit.  No results found for this or any previous visit.  No results found for this or any previous visit.   Assessment & Plan:    There are no diagnoses linked to this encounter.  No follow-ups on file.  Chelsea Aus, MD  [facility] [place]

## 2023-07-23 ENCOUNTER — Ambulatory Visit: Payer: 59 | Admitting: Urology

## 2023-07-23 DIAGNOSIS — R310 Gross hematuria: Secondary | ICD-10-CM

## 2023-07-31 DIAGNOSIS — R0981 Nasal congestion: Secondary | ICD-10-CM | POA: Diagnosis not present

## 2023-07-31 DIAGNOSIS — U071 COVID-19: Secondary | ICD-10-CM | POA: Diagnosis not present

## 2024-01-17 DIAGNOSIS — E119 Type 2 diabetes mellitus without complications: Secondary | ICD-10-CM | POA: Diagnosis not present

## 2024-01-17 DIAGNOSIS — E785 Hyperlipidemia, unspecified: Secondary | ICD-10-CM | POA: Diagnosis not present

## 2024-01-17 DIAGNOSIS — I10 Essential (primary) hypertension: Secondary | ICD-10-CM | POA: Diagnosis not present

## 2024-01-20 DIAGNOSIS — E782 Mixed hyperlipidemia: Secondary | ICD-10-CM | POA: Diagnosis not present

## 2024-01-20 DIAGNOSIS — I10 Essential (primary) hypertension: Secondary | ICD-10-CM | POA: Diagnosis not present

## 2024-01-20 DIAGNOSIS — E1165 Type 2 diabetes mellitus with hyperglycemia: Secondary | ICD-10-CM | POA: Diagnosis not present

## 2024-02-21 ENCOUNTER — Encounter: Payer: Self-pay | Admitting: Radiology

## 2024-04-08 DIAGNOSIS — M1712 Unilateral primary osteoarthritis, left knee: Secondary | ICD-10-CM | POA: Diagnosis not present

## 2024-04-08 DIAGNOSIS — G8929 Other chronic pain: Secondary | ICD-10-CM | POA: Diagnosis not present

## 2024-04-21 DIAGNOSIS — E782 Mixed hyperlipidemia: Secondary | ICD-10-CM | POA: Diagnosis not present

## 2024-04-21 DIAGNOSIS — I10 Essential (primary) hypertension: Secondary | ICD-10-CM | POA: Diagnosis not present

## 2024-04-21 DIAGNOSIS — E1165 Type 2 diabetes mellitus with hyperglycemia: Secondary | ICD-10-CM | POA: Diagnosis not present

## 2024-04-29 DIAGNOSIS — I10 Essential (primary) hypertension: Secondary | ICD-10-CM | POA: Diagnosis not present

## 2024-04-29 DIAGNOSIS — E782 Mixed hyperlipidemia: Secondary | ICD-10-CM | POA: Diagnosis not present

## 2024-04-29 DIAGNOSIS — Z0000 Encounter for general adult medical examination without abnormal findings: Secondary | ICD-10-CM | POA: Diagnosis not present

## 2024-04-29 DIAGNOSIS — Z0001 Encounter for general adult medical examination with abnormal findings: Secondary | ICD-10-CM | POA: Diagnosis not present

## 2024-04-29 DIAGNOSIS — R7989 Other specified abnormal findings of blood chemistry: Secondary | ICD-10-CM | POA: Diagnosis not present

## 2024-04-29 DIAGNOSIS — E1165 Type 2 diabetes mellitus with hyperglycemia: Secondary | ICD-10-CM | POA: Diagnosis not present

## 2024-05-04 ENCOUNTER — Encounter: Payer: Self-pay | Admitting: Radiology

## 2024-07-23 ENCOUNTER — Other Ambulatory Visit (HOSPITAL_COMMUNITY): Payer: Self-pay | Admitting: Family Medicine

## 2024-07-23 DIAGNOSIS — M25512 Pain in left shoulder: Secondary | ICD-10-CM

## 2024-07-29 ENCOUNTER — Encounter (HOSPITAL_COMMUNITY): Payer: Self-pay

## 2024-07-29 ENCOUNTER — Ambulatory Visit (HOSPITAL_COMMUNITY)

## 2024-07-29 ENCOUNTER — Ambulatory Visit (HOSPITAL_COMMUNITY): Admission: RE | Admit: 2024-07-29
